# Patient Record
Sex: Female | Born: 1960 | Race: White | Hispanic: No | Smoking: Former smoker
Health system: Southern US, Community
[De-identification: ages and names within clinical notes are randomized; demographics above are authoritative.]

## PROBLEM LIST (undated history)

## (undated) DIAGNOSIS — F419 Anxiety disorder, unspecified: Secondary | ICD-10-CM

## (undated) DIAGNOSIS — F32A Depression, unspecified: Secondary | ICD-10-CM

## (undated) DIAGNOSIS — R079 Chest pain, unspecified: Secondary | ICD-10-CM

## (undated) DIAGNOSIS — G4733 Obstructive sleep apnea (adult) (pediatric): Secondary | ICD-10-CM

## (undated) DIAGNOSIS — K219 Gastro-esophageal reflux disease without esophagitis: Secondary | ICD-10-CM

## (undated) DIAGNOSIS — O2230 Deep phlebothrombosis in pregnancy, unspecified trimester: Secondary | ICD-10-CM

## (undated) DIAGNOSIS — E7800 Pure hypercholesterolemia, unspecified: Secondary | ICD-10-CM

## (undated) DIAGNOSIS — F329 Major depressive disorder, single episode, unspecified: Secondary | ICD-10-CM

## (undated) DIAGNOSIS — H9319 Tinnitus, unspecified ear: Secondary | ICD-10-CM

## (undated) DIAGNOSIS — E669 Obesity, unspecified: Secondary | ICD-10-CM

## (undated) DIAGNOSIS — Z9289 Personal history of other medical treatment: Secondary | ICD-10-CM

## (undated) DIAGNOSIS — K760 Fatty (change of) liver, not elsewhere classified: Secondary | ICD-10-CM

## (undated) DIAGNOSIS — I4819 Other persistent atrial fibrillation: Secondary | ICD-10-CM

## (undated) DIAGNOSIS — I2699 Other pulmonary embolism without acute cor pulmonale: Secondary | ICD-10-CM

## (undated) DIAGNOSIS — I10 Essential (primary) hypertension: Secondary | ICD-10-CM

## (undated) DIAGNOSIS — Z9989 Dependence on other enabling machines and devices: Secondary | ICD-10-CM

## (undated) DIAGNOSIS — M797 Fibromyalgia: Secondary | ICD-10-CM

## (undated) DIAGNOSIS — G43909 Migraine, unspecified, not intractable, without status migrainosus: Secondary | ICD-10-CM

## (undated) DIAGNOSIS — D649 Anemia, unspecified: Secondary | ICD-10-CM

## (undated) DIAGNOSIS — G4730 Sleep apnea, unspecified: Secondary | ICD-10-CM

## (undated) HISTORY — PX: COLONOSCOPY: SHX174

## (undated) HISTORY — PX: ULNAR NERVE TRANSPOSITION: SHX2595

## (undated) HISTORY — PX: APPENDECTOMY: SHX54

## (undated) HISTORY — PX: INNER EAR SURGERY: SHX679

---
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
## 1983-11-05 HISTORY — PX: TUBAL LIGATION: SHX77

## 1998-09-06 ENCOUNTER — Emergency Department (HOSPITAL_COMMUNITY): Admission: EM | Admit: 1998-09-06 | Discharge: 1998-09-06 | Payer: Self-pay | Admitting: Emergency Medicine

## 2000-10-10 ENCOUNTER — Encounter (INDEPENDENT_AMBULATORY_CARE_PROVIDER_SITE_OTHER): Payer: Self-pay | Admitting: Specialist

## 2000-10-10 ENCOUNTER — Ambulatory Visit (HOSPITAL_BASED_OUTPATIENT_CLINIC_OR_DEPARTMENT_OTHER): Admission: RE | Admit: 2000-10-10 | Discharge: 2000-10-10 | Payer: Self-pay | Admitting: *Deleted

## 2004-07-28 ENCOUNTER — Emergency Department (HOSPITAL_COMMUNITY): Admission: EM | Admit: 2004-07-28 | Discharge: 2004-07-28 | Payer: Self-pay | Admitting: Emergency Medicine

## 2004-07-30 ENCOUNTER — Inpatient Hospital Stay (HOSPITAL_COMMUNITY): Admission: EM | Admit: 2004-07-30 | Discharge: 2004-08-01 | Payer: Self-pay | Admitting: Emergency Medicine

## 2004-10-30 ENCOUNTER — Emergency Department (HOSPITAL_COMMUNITY): Admission: EM | Admit: 2004-10-30 | Discharge: 2004-10-30 | Payer: Self-pay | Admitting: Advanced Practice Midwife

## 2005-09-06 ENCOUNTER — Emergency Department (HOSPITAL_COMMUNITY): Admission: EM | Admit: 2005-09-06 | Discharge: 2005-09-07 | Payer: Self-pay | Admitting: Emergency Medicine

## 2006-02-19 ENCOUNTER — Inpatient Hospital Stay (HOSPITAL_COMMUNITY): Admission: EM | Admit: 2006-02-19 | Discharge: 2006-02-28 | Payer: Self-pay | Admitting: *Deleted

## 2006-02-20 ENCOUNTER — Ambulatory Visit: Payer: Self-pay | Admitting: *Deleted

## 2006-03-03 ENCOUNTER — Other Ambulatory Visit (HOSPITAL_COMMUNITY): Admission: RE | Admit: 2006-03-03 | Discharge: 2006-03-05 | Payer: Self-pay | Admitting: Psychiatry

## 2006-03-12 ENCOUNTER — Emergency Department (HOSPITAL_COMMUNITY): Admission: EM | Admit: 2006-03-12 | Discharge: 2006-03-13 | Payer: Self-pay | Admitting: Emergency Medicine

## 2006-03-13 ENCOUNTER — Inpatient Hospital Stay (HOSPITAL_COMMUNITY): Admission: EM | Admit: 2006-03-13 | Discharge: 2006-03-17 | Payer: Self-pay | Admitting: Psychiatry

## 2006-03-18 ENCOUNTER — Other Ambulatory Visit (HOSPITAL_COMMUNITY): Admission: RE | Admit: 2006-03-18 | Discharge: 2006-04-17 | Payer: Self-pay | Admitting: Psychiatry

## 2006-07-14 ENCOUNTER — Other Ambulatory Visit: Admission: RE | Admit: 2006-07-14 | Discharge: 2006-07-14 | Payer: Self-pay | Admitting: Family Medicine

## 2008-02-11 ENCOUNTER — Other Ambulatory Visit: Admission: RE | Admit: 2008-02-11 | Discharge: 2008-02-11 | Payer: Self-pay | Admitting: Family Medicine

## 2008-08-24 ENCOUNTER — Observation Stay (HOSPITAL_COMMUNITY): Admission: EM | Admit: 2008-08-24 | Discharge: 2008-08-26 | Payer: Self-pay | Admitting: Emergency Medicine

## 2008-12-07 ENCOUNTER — Encounter: Admission: RE | Admit: 2008-12-07 | Discharge: 2008-12-07 | Payer: Self-pay | Admitting: Family Medicine

## 2009-02-10 ENCOUNTER — Other Ambulatory Visit: Admission: RE | Admit: 2009-02-10 | Discharge: 2009-02-10 | Payer: Self-pay | Admitting: Family Medicine

## 2009-03-15 ENCOUNTER — Encounter: Admission: RE | Admit: 2009-03-15 | Discharge: 2009-03-15 | Payer: Self-pay | Admitting: Family Medicine

## 2009-04-09 ENCOUNTER — Observation Stay (HOSPITAL_COMMUNITY): Admission: EM | Admit: 2009-04-09 | Discharge: 2009-04-09 | Payer: Self-pay | Admitting: Emergency Medicine

## 2009-06-22 ENCOUNTER — Encounter: Admission: RE | Admit: 2009-06-22 | Discharge: 2009-06-22 | Payer: Self-pay

## 2009-09-18 ENCOUNTER — Encounter: Admission: RE | Admit: 2009-09-18 | Discharge: 2009-09-18 | Payer: Self-pay | Admitting: Family Medicine

## 2009-10-23 IMAGING — CR DG LUMBAR SPINE COMPLETE 4+V
5 series · 5 of 5 positions shown · non-contrast
Comparison: 07/30/2004

Addendum Begins

Note that the SI joints appear unremarkable.
Addendum Ends
CLINICAL DATA: Pain right SI area; question sacroiliitis
LUMBAR SPINE - COMPLETE 4+ VIEW

[view not recorded (1 of 5)]
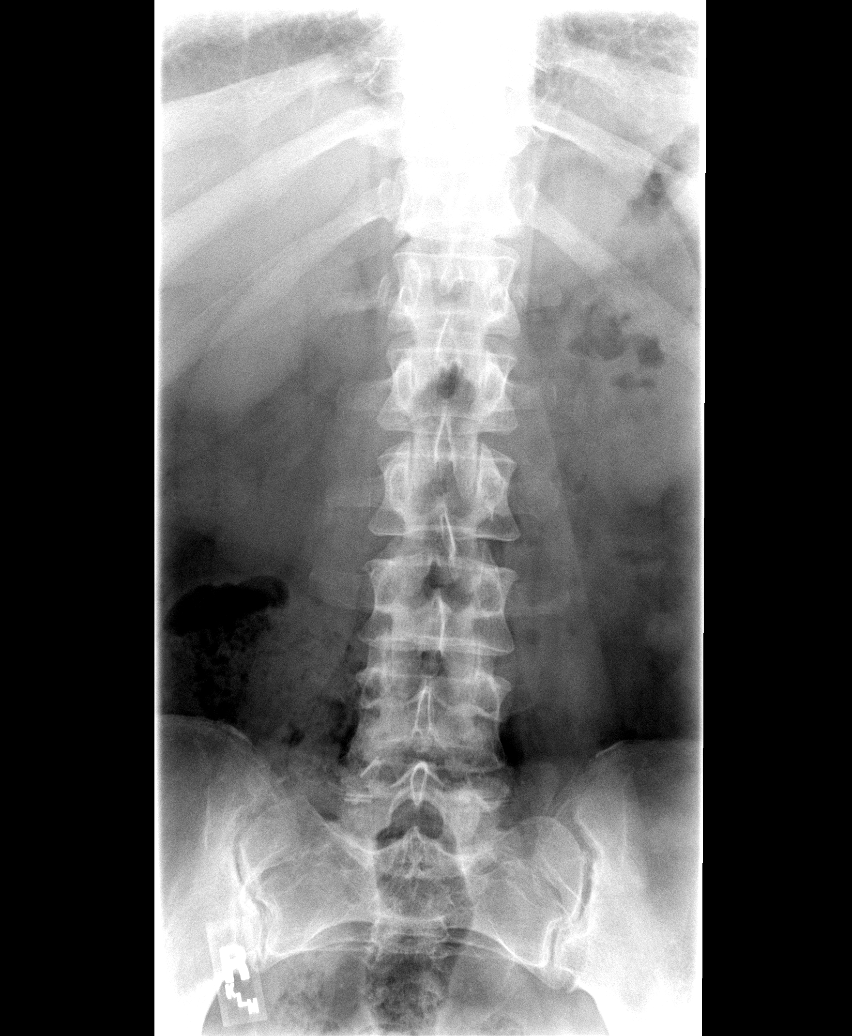

[view not recorded (2 of 5)]
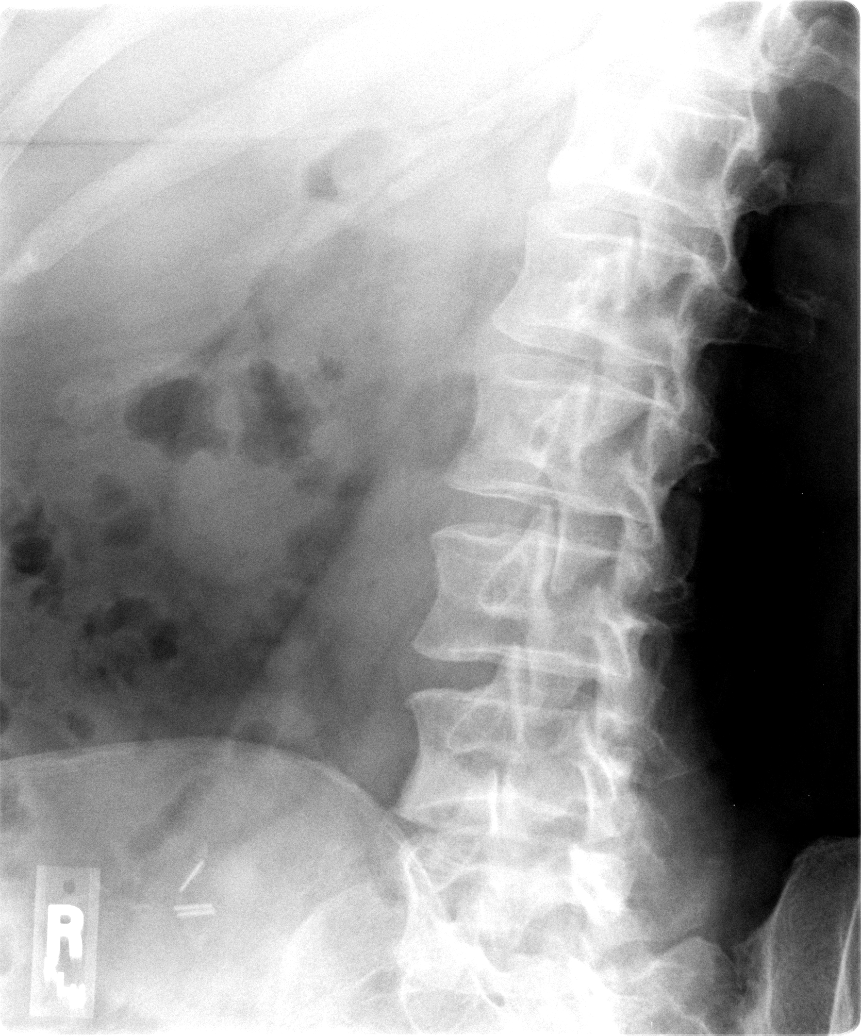

[view not recorded (3 of 5)]
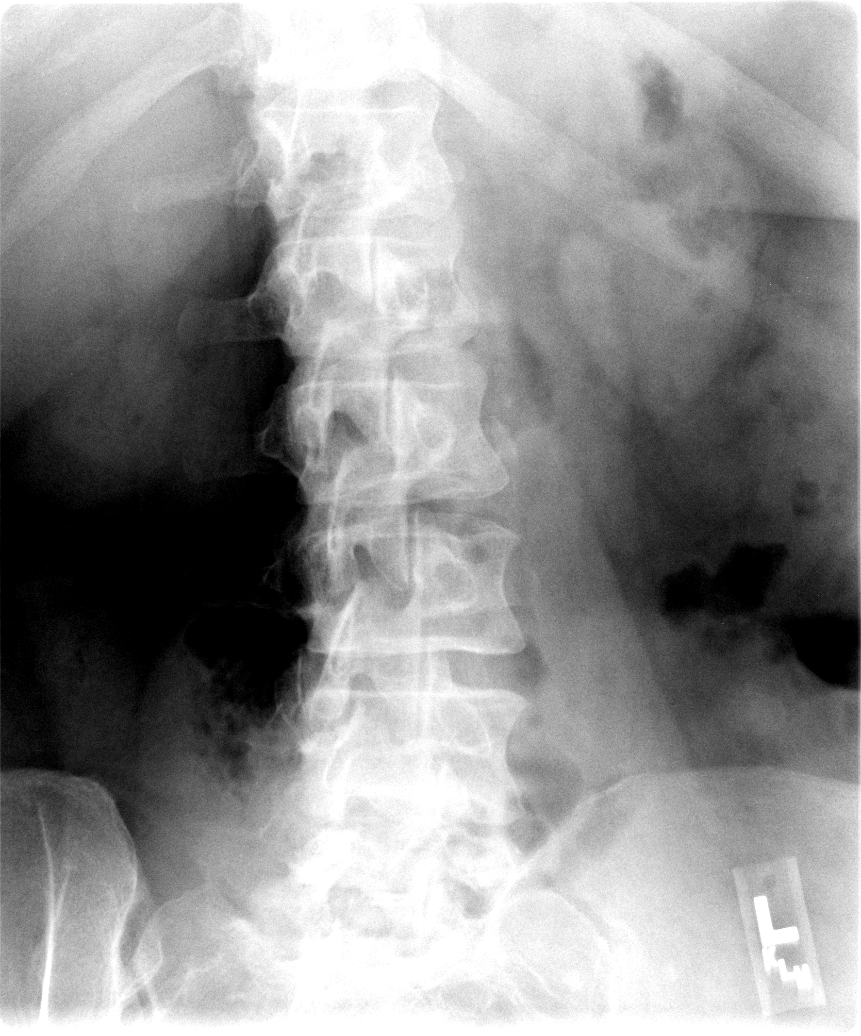

[view not recorded (4 of 5)]
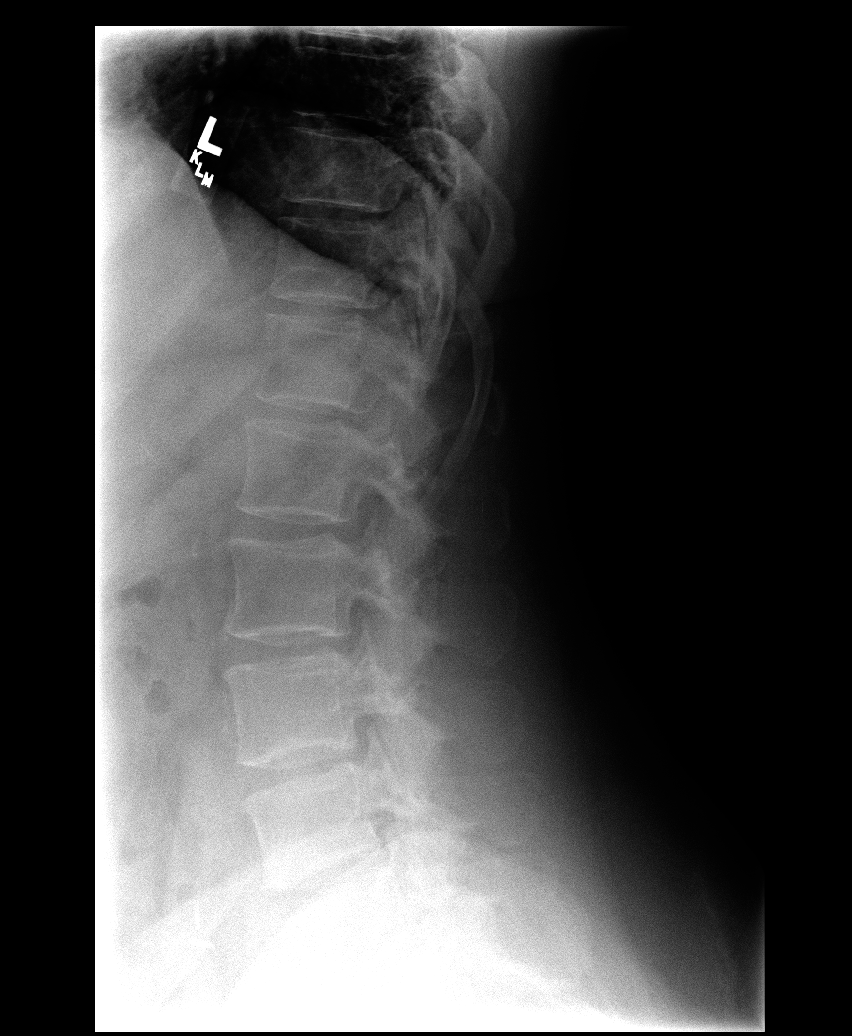

[view not recorded (5 of 5)]
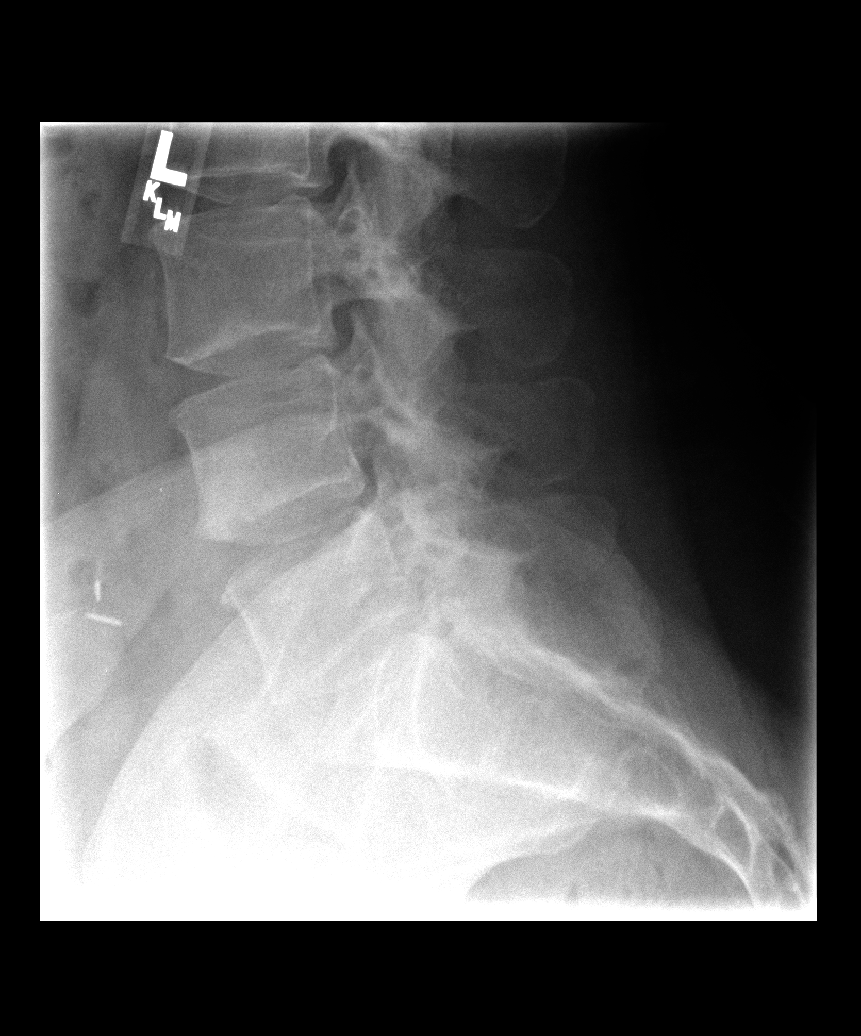

[5 of 5 positions shown; findings below may reference images not displayed]

FINDINGS: There is no evidence of lumbar spine fracture.  Alignment
is normal.  Intervertebral disc spaces are maintained.No pars
defects or spondylolisthesis.
IMPRESSION: Negative.

## 2010-05-30 ENCOUNTER — Other Ambulatory Visit: Admission: RE | Admit: 2010-05-30 | Discharge: 2010-05-30 | Payer: Self-pay | Admitting: Family Medicine

## 2010-11-26 ENCOUNTER — Encounter: Payer: Self-pay | Admitting: Family Medicine

## 2011-02-11 LAB — PROTIME-INR
INR: 1 (ref 0.00–1.49)
Prothrombin Time: 13.2 seconds (ref 11.6–15.2)

## 2011-02-11 LAB — DIFFERENTIAL
Basophils Absolute: 0 10*3/uL (ref 0.0–0.1)
Basophils Relative: 1 % (ref 0–1)
Eosinophils Absolute: 0.1 10*3/uL (ref 0.0–0.7)
Eosinophils Relative: 3 % (ref 0–5)
Lymphocytes Relative: 30 % (ref 12–46)
Lymphs Abs: 1.5 10*3/uL (ref 0.7–4.0)
Monocytes Absolute: 0.3 10*3/uL (ref 0.1–1.0)
Monocytes Relative: 7 % (ref 3–12)
Neutro Abs: 3.1 10*3/uL (ref 1.7–7.7)
Neutrophils Relative %: 61 % (ref 43–77)

## 2011-02-11 LAB — POCT CARDIAC MARKERS
CKMB, poc: <1 ng/mL — ABNORMAL LOW (ref 1.0–8.0)
CKMB, poc: <1 ng/mL — ABNORMAL LOW (ref 1.0–8.0)
Myoglobin, poc: 33.9 ng/mL (ref 12–200)
Myoglobin, poc: 38.6 ng/mL (ref 12–200)
Troponin i, poc: <0.05 ng/mL (ref 0.00–0.09)
Troponin i, poc: <0.05 ng/mL (ref 0.00–0.09)

## 2011-02-11 LAB — COMPREHENSIVE METABOLIC PANEL
ALT: 74 U/L — ABNORMAL HIGH (ref 0–35)
AST: 47 U/L — ABNORMAL HIGH (ref 0–37)
Albumin: 3.9 g/dL (ref 3.5–5.2)
Alkaline Phosphatase: 68 U/L (ref 39–117)
BUN: 8 mg/dL (ref 6–23)
CO2: 25 mEq/L (ref 19–32)
Calcium: 9.5 mg/dL (ref 8.4–10.5)
Chloride: 112 mEq/L (ref 96–112)
Creatinine, Ser: 0.71 mg/dL (ref 0.4–1.2)
GFR calc Af Amer: >60 mL/min (ref >60)
GFR calc non Af Amer: >60 mL/min (ref >60)
Glucose, Bld: 103 mg/dL — ABNORMAL HIGH (ref 70–99)
Potassium: 3.9 mEq/L (ref 3.5–5.1)
Sodium: 144 mEq/L (ref 135–145)
Total Bilirubin: 0.4 mg/dL (ref 0.3–1.2)
Total Protein: 6.5 g/dL (ref 6.0–8.3)

## 2011-02-11 LAB — POCT I-STAT, CHEM 8
BUN: 11 mg/dL (ref 6–23)
Calcium, Ion: 1.16 mmol/L (ref 1.12–1.32)
Chloride: 107 mEq/L (ref 96–112)
Creatinine, Ser: 0.9 mg/dL (ref 0.4–1.2)
Glucose, Bld: 111 mg/dL — ABNORMAL HIGH (ref 70–99)
HCT: 35 % — ABNORMAL LOW (ref 36.0–46.0)
Hemoglobin: 11.9 g/dL — ABNORMAL LOW (ref 12.0–15.0)
Potassium: 3.8 mEq/L (ref 3.5–5.1)
Sodium: 141 mEq/L (ref 135–145)
TCO2: 25 mmol/L (ref 0–100)

## 2011-02-11 LAB — CBC
HCT: 33.6 % — ABNORMAL LOW (ref 36.0–46.0)
Hemoglobin: 11.7 g/dL — ABNORMAL LOW (ref 12.0–15.0)
MCHC: 34.9 g/dL (ref 30.0–36.0)
MCV: 85.9 fL (ref 78.0–100.0)
Platelets: 236 10*3/uL (ref 150–400)
RBC: 3.91 MIL/uL (ref 3.87–5.11)
RDW: 12.9 % (ref 11.5–15.5)
WBC: 5.1 10*3/uL (ref 4.0–10.5)

## 2011-02-11 LAB — CK TOTAL AND CKMB (NOT AT ARMC)
CK, MB: 1.1 ng/mL (ref 0.3–4.0)
Relative Index: INVALID (ref 0.0–2.5)
Total CK: 92 U/L (ref 7–177)

## 2011-02-11 LAB — APTT: aPTT: 31 seconds (ref 24–37)

## 2011-02-11 LAB — D-DIMER, QUANTITATIVE: D-Dimer, Quant: 0.83 ug/mL-FEU — ABNORMAL HIGH (ref 0.00–0.48)

## 2011-03-19 NOTE — Consult Note (Signed)
NAME:  Regina Morales, Regina Morales              ACCOUNT NO.:  [id number]   MEDICAL RECORD NO.:  [id number]          PATIENT TYPE:  OBV   LOCATION:  4735                         FACILITY:  [facility]   PHYSICIAN:  Armanda Magic, M.D.     DATE OF BIRTH:  Jul 24, 1961   DATE OF CONSULTATION:  DATE OF DISCHARGE:  08/26/2008                                 CONSULTATION   CHIEF COMPLAINT/REASON FOR CONSULTATION:  Chest pain.   Ms. Regina Morales is a 50 year old female who started feeling sick and  nauseated around 3:00 p.m. on August 24, 2008.  She went home and then  started having substernal chest pain that she describes as a cramping  sensation.  There was no radiation.  Incidentally, she did have a short  episode of lower back pain as well.  Anyway, she got scared and called  EMS.  At the hospital, she was given sublingual nitroglycerin x1 with  partial relief, but morphine totally relieved her symptoms.  She has had  no further chest pain.   PAST MEDICAL HISTORY:  History of appendectomy.  She also states that  she has had blood clots in her legs before and that was treated with  heparin but denies ever being on Coumadin and this was years ago.   SOCIAL HISTORY:  She lives in [place].  She quit smoking 20 years  ago.  No alcohol or illicit drug use.   FAMILY HISTORY:  Dad had coronary disease in his 34s.   ALLERGIES:  DARVON and CODEINE.   MEDICATIONS PRIOR TO ADMISSION:  None.   MEDICATIONS IN THE HOSPITAL:  1. Lovenox 40 mg daily.  2. Protonix 40 mg a day.   REVIEW OF SYMPTOMS:  Chest pain, nausea.  Review of systems is otherwise  negative.   PHYSICAL EXAMINATION:  VITAL SIGNS:  Temp 97.60, respirations 20, blood  pressure 100/62, O2 saturation 95% on room air.  GENERAL:  She is in no acute distress.  HEENT:  Grossly normal.  Sclerae clear.  Conjunctivae normal.  Nares  without drainage.  NECK:  No carotid or subclavian bruits.  No JVD or thyromegaly.  CHEST:  Clear to auscultation  bilaterally.  No wheezing or rhonchi.  HEART:  Regular rate and rhythm.  No gross murmur.  ABDOMEN:  Good bowel sounds, nontender, nondistended.  No masses.  No  bruits.  Obese.  EXTREMITIES:  No peripheral edema.  Palpable PT/DP pulses bilaterally.  SKIN:  Warm and dry.  NEURO:  Cranial nerves II through XII grossly intact.  She has underwent  a normal mood and affect.   Chest x-ray showed mild basilar atelectasis.   LABORATORY DATA:  Urinalysis negative.  Urine pregnancy test negative.  Total cholesterol 176, triglycerides 77, HDL 46, LDL 115, D-dimer 1.21.  BNP less than 30.  Hemoglobin A1c 5.2.  TSH 0.522.  Hemoglobin 11.3,  hematocrit 32.9, platelets 256, white count 7.6.  Sodium 139, potassium  3.9, BUN 11, creatinine 0.73.  PT 13.4, INR 1.0.  Cardiac enzymes  negative x3.   EKG, normal sinus rhythm at 66 with a first-degree AV block, otherwise,  normal.   ASSESSMENT/PLAN:  1. Chest pain.  She is placed on Protonix and Lovenox      prophylactically.  Her enzymes were negative.  2. Elevated D-dimer, needs CT of the chest.  If CT of the chest is      negative for PE, she will need a stress Cardiolite.  We can do this      as an outpatient, so that she is ruled out and is currently pain      free.  The patient was seen and examined with Dr. Armanda Magic.      Guy Franco, P.A.      Armanda Magic, M.D.  Electronically Signed    LB/MEDQ  D:  08/26/2008  T:  08/26/2008  Job:  [id number]   cc:   L. Lupe Carney, M.D.

## 2011-03-19 NOTE — H&P (Signed)
NAME:  Regina Morales, Regina Morales              ACCOUNT NO.:  [id number]   MEDICAL RECORD NO.:  [id number]          PATIENT TYPE:  INP   LOCATION:  4738                         FACILITY:  [facility]   PHYSICIAN:  Darryl D. Prime, MD    DATE OF BIRTH:  1961/04/09   DATE OF ADMISSION:  04/09/2009  DATE OF DISCHARGE:                              HISTORY & PHYSICAL   Patient is full code.   PRIMARY CARE PHYSICIAN:  L. Lupe Carney, M.D.   CHIEF COMPLAINT:  Chest pain.   HISTORY OF PRESENT ILLNESS:  Ms. Regina Morales is a 50 year old female with a  history of obesity, history of chest pain in October 2009, had a CT scan  of the chest after positive D-dimer that was negative for pulmonary  embolus but did show a mass, right upper lobe, 12 x 10 mm.  She had an  outpatient stress exercise echocardiogram which was negative but there  was concern for regurgitation.  She notes that chest pain woke her from  sleep tonight around midnight, described as a pressure, dull sensation,  left of the sternum with associated shortness of breath and dizziness.  It was not pleuritic.  It was not positional.  It was not radiating.  She did have sweats with it.  It lasted 30 minutes.  She thinks it may  have been relieved with Valium which she took.  She woke up her daughter  and EMS was called.  En route they gave her 325 mg of aspirin.  The  episode lasted for 30 minutes.  She denies any recent black or bloody  stools.  She has been taking etodolac recently because of right hip  pain.  The patient denies any recent fever or cough.   PAST MEDICAL AND SURGICAL HISTORY:  1. History of depression, admitted in 2007 for this.  She took      multiple pills at that time in an unprescribed fashion.  2. History of chest pain as above.  3. History of a 12 x 10 mm right upper lobe lesion which was followed      up in February 2010 by a CT scan with no change.  She is scheduled      to have a repeat in August 2010.  4. History of right  hip pain.  5. 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   ALLERGIES:  DARVON and CODEINE.   MEDICATIONS:  1. Ultram 50 mg every 4 hours as needed for pain.  2. Valium 5 to 10 mg, she takes this p.r.n., takes on average every      week or so.  3. Etodolac 400 mg twice a day.   SOCIAL HISTORY:  She did smoke in the past, discontinued about 18 years  ago.  No alcohol or illicit drug use.  She works for [employer].   FAMILY HISTORY:  Mother died in her 4s of congestive heart failure, she  died in Nov 12, 2023.  Father died in his 52s due to complications of a  myocardial infarction.   REVIEW OF SYSTEMS:  A 14-point review of systems is negative  unless  stated above.  She has had recent significant depressed mood and anxiety  related to her mother's death in 2023/10/29.   PHYSICAL EXAMINATION:  VITAL SIGNS:  Temperature 98.2, blood pressure  116/61, pulse of 75, respirations 17, oxygen saturation 99% in room air.  GENERAL:  She is an obese female, lying flat in bed in no acute  distress.  HEENT:  Normocephalic, atraumatic.  Pupils are equal, round and reactive  to light with extraocular movements being intact.  Oropharynx shows no posterior pharyngeal lesions.  NECK:  Supple without lymphadenopathy or thyromegaly.  No carotid  bruits.  LUNGS:  Clear to auscultation bilaterally.  CARDIOVASCULAR:  Regular rate and rhythm.  No murmurs, rubs or gallops.  Normal S1 and S2.  No S3 or S4.  ABDOMEN:  Obese, soft, nontender.  Nondistended.  Normoactive bowel  sounds.  EXTREMITIES:  No clubbing, cyanosis, but she does have 1-2+ lower  extremity edema.  NEUROLOGIC: She is alert and oriented x4 with cranial nerves II through  XII grossly intact.  Strength and sensation grossly intact.  SKIN:  No rash or ulcers.  LYMPHATICS:  She has 2+ lower extremity edema as above with no anterior  cervical lymphadenopathy.  PSYCHIATRIC:  Normal mood and affect, alert and oriented x4.  MUSCULOSKELETAL:  She moves  all her joints well with no signs of  effusion.   LABORATORY DATA:  White count is 5.1 with hemoglobin of 11.7, hematocrit  33.6, platelet count of  236,000, segs 61, lymphocytes 30.  Cardiac  markers at 3:40 a.m. and 5:10 a.m. were unremarkable.  The patient's EKG  showed sinus rhythm with a ventricular rate of 75 beats per minute,  otherwise no significant ST or T changes.  No change compared to October  2009 EKG.   ASSESSMENT AND PLAN:  This is a patient with a history of deep vein  thrombosis, history of obesity, lower extremity edema, who has had a  recent chest pain episode and per patient had a negative stress test in  November 2009.  She is now here again with chest pain.  Differential  diagnoses could include supraventricular tachycardia, but could also  include deep vein thrombosis or pulmonary embolus syndrome, however less  likely acute coronary syndrome.   At this time, she will be admitted to rule out acute coronary syndrome  with cardiac markers x3.  She will be on aspirin.  There is a small  possibility that this could be an ulcer so will Hemoccult her stools and  hold etodolac for now.  For her rule out pulmonary embolus, will get a D-  dimer and if positive she may need imaging again.   For the patient's concern for possible tachycardia arrhythmias, she will  be on telemetry.  Deep vein thrombosis and gastrointestinal prophylaxis  will be ordered.      Darryl D. Prime, MD  Electronically Signed     DDP/MEDQ  D:  04/09/2009  T:  04/09/2009  Job:  [id number]

## 2011-03-19 NOTE — H&P (Signed)
NAME:  Regina Morales, Regina Morales              ACCOUNT NO.:  [id number]   MEDICAL RECORD NO.:  [id number]          PATIENT TYPE:  EMS   LOCATION:  MAJO                         FACILITY:  [facility]   PHYSICIAN:  Michiel Cowboy, MDDATE OF BIRTH:  [birth year]   DATE OF ADMISSION:  08/24/2008  DATE OF DISCHARGE:                              HISTORY & PHYSICAL   PRIMARY CARE PHYSICIAN:  L. Lupe Carney, M.D.   CHIEF COMPLAINT:  Chest pain.   HISTORY OF PRESENT ILLNESS:  The patient is a 50 year old female with no  past medical history, presents with sudden onset of sharp substernal and  left chest pain, nonradiating, nonexertional, occurred at rest.  The  patient was not feeling well the whole day.  Once the pain occurred,  she started feeling diaphoretic, nauseous and short of breath, at which  point she called EMS.  Of note, her family history is significant for  father dying of MI in his 3s.  She did not know about the rest of her  family because she does not keep up with them.  She has not had any  recent travel or plane ride.  She does have sedentary job.  She reports  occasionally she has a lot of lower extremity edema, but not currently.   Otherwise, review of systems negative.  No fevers or chills.  No  vomiting, although she had nausea, no diarrhea.   PAST MEDICAL HISTORY:  None.   SOCIAL HISTORY:  Used to smoke, but quit 18 years ago.  Does not drink  alcohol.  Lives at home.  Currently unemployed.   ALLERGIES:  DARVON AND CODEINE.   MEDICATIONS:  None.   PHYSICAL EXAMINATION:  VITAL SIGNS:  Temperature 97.8, blood pressure  117/72, pulse 67, respirations 18, sating 98% on room air.  GENERAL:  The patient appears to be currently in no acute distress.  Obese female laying down in bed .  HEENT:  Nontraumatic.  Mucous membranes moist.  NECK:  Supple.  No lymphadenopathy noted.  HEART:  Regular rate and rhythm.  No murmurs, rubs or gallops.  No chest  wall tenderness noted.  LUNGS:  Clear to auscultation bilaterally.  ABDOMEN:  Slightly obese, but soft, nontender, nondistended.  EXTREMITIES:  Lower extremities without cyanosis, clubbing or edema.  NEUROLOGICAL:  Intact.   LABORATORY DATA:  White blood cell count 8.1, hemoglobin 12, sodium 135,  potassium 4.1, creatinine 0.72.  Cardiac markers within normal limits.  LFTs only significant for ALT slightly elevated at 55.  UA within normal  limits.  EKG showing sinus rhythm with questionable first degree AV  block.  No old EKG available.  Chest x-ray showing mild patchy density  in the lower lobes that could be atelectasis and mild pneumonia.  D.  dimer not obtained.   ASSESSMENT/PLAN:  This is a 50 year old female with somewhat atypical  chest pain and exertional, but associated with shortness of breath with  some concern of family history.  Otherwise, no other risk factors.   1. Chest pain.  Given family history, we will admit and monitor.  Will  cycle cardiac enzymes x3.  Serial ECGs.  Further risks stratified      with fasting lipid panel, hemoglobin A1c.  Obtain TSH.  BNP and      order a D. dimer as the patient had sudden set of shortness of      breath and chest pain.  2. Abnormal chest x-ray.  Question atelectasis versus infiltrate.      Will repeat in a.m.  At this point, will not use antibiotics as the      patient does not have white blood cell count, does not have cough      or fever.  I think this is more likely atelectasis.  3. Protonix and Lovenox for prophylaxis.      Michiel Cowboy, MD  Electronically Signed     AVD/MEDQ  D:  08/24/2008  T:  08/25/2008  Job:  [id number]   cc:   L. Lupe Carney, M.D.

## 2011-03-19 NOTE — Discharge Summary (Signed)
NAME:  Regina Morales, Regina Morales              ACCOUNT NO.:  [id number]   MEDICAL RECORD NO.:  [id number]          PATIENT TYPE:  OBV   LOCATION:  4735                         FACILITY:  [facility]   PHYSICIAN:  Ramiro Harvest, MD    DATE OF BIRTH:  November 27, 1960   DATE OF ADMISSION:  08/24/2008  DATE OF DISCHARGE:  08/26/2008                               DISCHARGE SUMMARY   ATTENDING PHYSICIAN:  Ramiro Harvest, MD   DISCHARGE DIAGNOSES:  1. Chest pain of unclear etiology.  2. Right upper lobe nodular area.   DISCHARGE MEDICATIONS:  Nitroglycerin 0.4 mg sublingual q.5 minutes x3  for chest pain p.r.n.   PRIMARY CARE PHYSICIAN:  L. Lupe Carney, MD, of [facility].   DISPOSITION AND FOLLOWUP:  The patient will be discharged home.  The  patient will need to follow up with PCP in 2 weeks and followup basic  metabolic profile needs to be checked to follow up on electrolytes and  renal function.  The patient will also need a repeat chest CT in 3  months to follow up on this right upper lobe nodular region.  The  patient is also scheduled to follow up with Dr. Armanda Magic on September 08, 2008, at 9:15 a.m. for outpatient stress Cardiolite.   CONSULTATIONS DONE:  A cardiology consult was done.  The patient was  seen in consultation by Dr. Armanda Magic of [facility] on August 25, 2008.   PROCEDURES PERFORMED:  A chest x-ray was performed on August 24, 2008,  that showed mild patchy density in the lower lobes that could represent  atelectasis or mild pneumonia.  Repeat chest x-ray was done on August 25, 2008, that showed no definite pneumonia, mild basilar atelectasis  remains.  CT angiogram of the chest was done on August 25, 2008, that  showed scattered areas of ill-defined opacification in the lungs  bilaterally.  It may represent an atypical infection.  The more nodular  area on the right upper lobe could represent infection or inflammation,  but it is concerning for neoplasm.   Short-term followup CT scan at 3  months for an appropriate therapy would be useful for further  evaluation.  No evidence for pulmonary embolism.   BRIEF ADMISSION HISTORY AND PHYSICAL:  Ms. Regina Morales is a 50-year-  old female with no significant past medical history, who presented with  sudden onset of sharp substernal left-sided chest pain, nonradiating in  nature, which occurred at rest.  The patient did not feel well the whole  day.  Once the patient's pain occurred, she started feeling diaphoretic,  nauseous, shortness of breath at which point she called EMS.  Of note,  she has a significant family history of acute MI with her father dying  in his 87s.  The patient did not know what the rest of her family had  because she has not kept up with them.  The patient has not had any  recent travel or long plane rides.  The patient does have a sedentary  job.  The patient reports occasionally that she has had a  lot of lower  extremity edema, but is currently not experiencing any.   PHYSICAL EXAMINATION:  VITAL SIGNS:  Per admitting physician,  temperature 97.8, blood pressure 117/72, pulse of 67, respiratory rate  18, and satting 98% on room air.  GENERAL:  The patient is in no acute distress, obese female, lying in  bed.  HEENT:  Normocephalic and atraumatic.  Pupils are equal, round, and  reactive to light.  Extraocular movements are intact.  Oropharynx is  clear.  No lesions.  No exudates.  NECK:  Supple.  No lymphadenopathy.  CARDIOVASCULAR:  Regular rate and rhythm.  No murmurs, rubs, or gallops.  No chest wall tenderness.  RESPIRATORY:  Lungs are clear to auscultation bilaterally.  ABDOMEN:  Soft, obese, nontender, nondistended, positive bowel sounds.  EXTREMITIES:  No clubbing, cyanosis, or edema.  NEUROLOGICAL:  The patient is alert and oriented x3.  Cranial nerves II-  XII are grossly intact.  No focal deficits.   ADMISSION LABORATORY DATA:  CBC:  White count 8.1 and  hemoglobin 12.  Sodium 135, potassium 4.1, and creatinine 0.72.  Cardiac markers were  within normal limits.  LFTs only significant for a slight ALT elevation  at 55.  UA was within normal limits.  EKG showed a normal sinus rhythm  with a first-degree AV block.  No old EKG was available for comparison.  Chest x-ray as stated above showing mild patchy density in the lower  lobes, which could have atelectasis versus mild pneumonia.  D-dimer was  pending at the time.   HOSPITAL COURSE:  1. Chest pain.  The patient was admitted for chest pain, rule out MI      with a significant positive family history.  Cardiac enzymes were      cycled x3, which came back negative.  EKG was obtained, which did      not show any significant ST-T wave elevations.  A TSH was obtained,      which was within normal limits.  BNP obtained was less than 30.  A      D-dimer obtained was 1.21 and as such a CT angiogram of the chest      was obtained to rule out a PE as stated above.  CT angiogram of the      chest came back negative for a pulmonary embolism.  However, it did      show a nodular area in the right upper lobe and recommending 3      months followup CT.  The patient remained stable during the      hospitalization.  The patient's symptoms improved.  The patient was      not short of breath.  The patient did have 1 minor episode of chest      pain in the same area, which was not long lasting.  The patient      remained in stable and improved condition.  Cardiology was      consulted.  The patient was seen in consultation by Dr. Armanda Magic of [facility] on August 25, 2008, and felt that as      the patient had ruled out for acute coronary syndrome, the patient      was scheduled to have a followup outpatient stress Cardiolite,      which was scheduled for September 08, 2008, at 9:15 a.m.  The patient      remained in stable and improved condition and the patient  will be      discharged home in  stable and improved condition.  2. Right upper lobe nodular area per CT.  The patient was afebrile,      had a normal white count.  The patient was asymptomatic, did not      have any signs or symptoms of infection.  It was felt that CT can      be followed up in 3 months as an outpatient for further evaluation      of this right upper lobe nodular area.   On day of discharge vital signs, temperature 98.4, blood pressure  125/73, pulse of 69, respiratory rate 20, and satting 99% on room air.   DISCHARGE LABORATORY DATA:  Sodium 139, potassium 3.8, chloride 107,  bicarb 26, BUN 9, creatinine 0.68, glucose of 87, and calcium of 8.9.  CBC:  White count 6.0, hemoglobin 11.6, hematocrit 34.3, and platelets  of 237.  TSH of 0.522.   It was a pleasure taking care of Ms. Eulah Regina Morales.      Ramiro Harvest, MD  Electronically Signed     DT/MEDQ  D:  08/26/2008  T:  08/27/2008  Job:  [id number]   cc:   L. Lupe Carney, M.D.  Armanda Magic, M.D.
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
## 2011-03-22 NOTE — Discharge Summary (Signed)
NAME:  Regina Morales, Regina Morales              ACCOUNT NO.:  [id number]   MEDICAL RECORD NO.:  [id number]          PATIENT TYPE:  IPS   LOCATION:  0507                          FACILITY:  [facility]   PHYSICIAN:  Geoffery Lyons, M.D.      DATE OF BIRTH:  12/15/1960   DATE OF ADMISSION:  03/13/2006  DATE OF DISCHARGE:  03/17/2006                                 DISCHARGE SUMMARY   CHIEF COMPLAINT AND PRESENTING ILLNESS:  This was one of several  admissions  to [facility]  for this 50 year old white female,  voluntarily admitted.  History of panic attacks, feeling overwhelmed, took  more pills than over several hours, 6 Ambien, 14 Ativan, and 10 Naproxen,  hoping to go to sleep, denies suicidal ideas, felt tired, multiple  stressors.   PAST PSYCHIATRIC HISTORY:  Last admission April 2007, depressed, isolating.  Sees Fred May as a therapist, history of major depression, has seen Producer, television/film/video and [place].   ALCOHOL AND DRUG HISTORY:  Denies any active drug or alcohol use.   PAST MEDICAL HISTORY:  Noncontributory.   MEDICATIONS:  Ambien CR 12.5 at night, Zoloft 200 mg in the morning, Ativan  1 mg 3 times a day, Naproxen 250 twice a day.   PHYSICAL EXAMINATION:  Performed, failed to show any acute findings.   LABORATORY WORKUP:  Blood chemistries: Sodium 137, potassium 3.8, glucose  89. Urine pregnancy test negative.  Drug screen positive for  benzodiazepines.   MENTAL STATUS EXAM:  Reveals a fully alert, cooperative female, fair eye  contact.  Speech clear, normal rate, tempo and production, mood depressed,  affect depressed, thought process logical, coherent and relevant.  No  evidence of delusions, no active suicidal or homicidal ideas, no  hallucinations. Cognition well preserved.   ADMISSION DIAGNOSES:  AXIS I:  Major depression, recurrent., Panic attacks.  AXIS II:  No diagnosis.  AXIS III:  No diagnosis.  AXIS IV:  Moderate.  AXIS V:  Upon admission 30, highest  global assessment of functioning in the  last year 65-70.   COURSE IN HOSPITAL:  She was admitted and started in individual and group  psychotherapy.  She was initially maintained on her Ambien.  Zoloft was  placed at 150 and then 100 mg.  She was maintained on the Ativan and the  Naproxen.  She was  recently discharged from the unit, started coming to the  IOP program, very depressed.  Had an argument with her daughter.  This set  the stage.  Afterwards she became more upset, wanting to go to sleep and  over the course of the night she took an overdose of Ambien, Ativan and  opioids.  Admits she was feeling very depressed, down, with the sadness,  lack of energy, lack of motivation, sense of hopelessness and helplessness,  suicidal ruminations.  She has been on Zoloft for a long time, felt that it  was not effective anymore, she was willing to switch.  Endorsed a lot of  anxiety.  Anxiety was overwhelming, feeling that she was at the verge of a  nervous breakdown all the time.  We switched from Zoloft to Paxil and we  added some Neurontin.  In the next couple of days she seemed to be  improving.  She started being no active, participating in group and in  individual sessions.  Objectively, she was better.  On May 14 she was in  full contact with reality, feeling much better, objectively affect was  brighter.  Endorsed no acute suicidal or homicidal ideas, no evidence of  delusions.  The daughter came.  They were able to talk about their issues  and what they needed to do to improve their relationship and make things  work for both of them.  She felt relieved that the daughter had a better  attitude and overall she felt ready to go home and continue outpatient  treatment.  Upon discharge in full contact with reality, no suicidal or  homicidal ideation, markedly improved from admission.   DISCHARGE DIAGNOSES:  AXIS I:  Major depression, recurrent, anxiety disorder  not otherwise  specified.  AXIS II:  No diagnosis.  AXIS III:  No diagnosis.  AXIS IV:  Moderate.  AXIS V:  Upon discharge 60.   DISCHARGE MEDICATIONS:  1.  Zoloft 50 mg for 2 more days, then stop.  2.  Paxil CR 25 mg per day.  3.  Seroquel 50 at night.  4.  Neurontin 100 3 times a day.  5.  Requip 0.25 at 5 p.m.  6.  Ambien 10 at night for sleep.   DISPOSITION:  Follow up Fred May and Valinda Hoar.  Resume treatment at  the  Intensive Outpatient Program at [facility].      Geoffery Lyons, M.D.  Electronically Signed     IL/MEDQ  D:  04/08/2006  T:  04/09/2006  Job:  [id number]
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
## 2011-08-05 LAB — CBC
HCT: 32.9 — ABNORMAL LOW
HCT: 36.5
Hemoglobin: 11.3 — ABNORMAL LOW
Hemoglobin: 11.6 — ABNORMAL LOW
Hemoglobin: 12
MCHC: 33.7
MCHC: 34.4
MCV: 85.3
MCV: 87.1
RBC: 3.86 — ABNORMAL LOW
RBC: 3.94
RBC: 4.19
WBC: 6
WBC: 8.1

## 2011-08-05 LAB — DIFFERENTIAL
Basophils Relative: 1
Eosinophils Absolute: 0.1
Eosinophils Relative: 1
Lymphs Abs: 1.4
Lymphs Abs: 2
Monocytes Absolute: 0.4
Monocytes Relative: 7
Neutro Abs: 3.4

## 2011-08-05 LAB — COMPREHENSIVE METABOLIC PANEL
ALT: 55 — ABNORMAL HIGH
AST: 32
BUN: 11
CO2: 27
CO2: 28
Calcium: 9.2
Calcium: 9.3
Chloride: 102
Creatinine, Ser: 0.73
GFR calc Af Amer: >60
GFR calc Af Amer: >60
GFR calc non Af Amer: >60
GFR calc non Af Amer: >60
Glucose, Bld: 125 — ABNORMAL HIGH
Potassium: 4.1
Sodium: 135
Total Bilirubin: 0.4

## 2011-08-05 LAB — URINALYSIS, ROUTINE W REFLEX MICROSCOPIC
Bilirubin Urine: NEGATIVE
Ketones, ur: NEGATIVE
Nitrite: NEGATIVE
pH: 7

## 2011-08-05 LAB — CARDIAC PANEL(CRET KIN+CKTOT+MB+TROPI)
CK, MB: 0.7
Relative Index: INVALID
Relative Index: INVALID
Troponin I: <0.01

## 2011-08-05 LAB — HEMOGLOBIN A1C
Hgb A1c MFr Bld: 5.2
Mean Plasma Glucose: 103

## 2011-08-05 LAB — BASIC METABOLIC PANEL
CO2: 26
Calcium: 8.9
Chloride: 107
GFR calc Af Amer: >60
Sodium: 139

## 2011-08-05 LAB — PROTIME-INR
INR: 1
Prothrombin Time: 13.4

## 2011-08-05 LAB — TROPONIN I: Troponin I: 0.02

## 2011-08-05 LAB — LIPID PANEL
Total CHOL/HDL Ratio: 3.8
VLDL: 15

## 2013-04-12 ENCOUNTER — Other Ambulatory Visit (HOSPITAL_COMMUNITY)
Admission: RE | Admit: 2013-04-12 | Discharge: 2013-04-12 | Disposition: A | Discharge status: Home / Self Care | Payer: Self-pay | Source: Ambulatory Visit | Attending: Family Medicine | Admitting: Family Medicine

## 2013-04-12 ENCOUNTER — Other Ambulatory Visit: Payer: Self-pay | Admitting: Family Medicine

## 2013-04-12 DIAGNOSIS — Z124 Encounter for screening for malignant neoplasm of cervix: Secondary | ICD-10-CM | POA: Insufficient documentation

## 2013-07-14 ENCOUNTER — Emergency Department (HOSPITAL_COMMUNITY): Payer: BC Managed Care – PPO

## 2013-07-14 ENCOUNTER — Encounter (HOSPITAL_COMMUNITY): Payer: Self-pay | Admitting: Nurse Practitioner

## 2013-07-14 ENCOUNTER — Observation Stay (HOSPITAL_COMMUNITY)
Admission: EM | Admit: 2013-07-14 | Discharge: 2013-07-15 | Disposition: A | Discharge status: Home / Self Care | Payer: BC Managed Care – PPO | Attending: Cardiovascular Disease | Admitting: Cardiovascular Disease

## 2013-07-14 DIAGNOSIS — I4891 Unspecified atrial fibrillation: Principal | ICD-10-CM | POA: Insufficient documentation

## 2013-07-14 DIAGNOSIS — Z6841 Body Mass Index (BMI) 40.0 and over, adult: Secondary | ICD-10-CM | POA: Insufficient documentation

## 2013-07-14 DIAGNOSIS — R079 Chest pain, unspecified: Secondary | ICD-10-CM

## 2013-07-14 DIAGNOSIS — R0789 Other chest pain: Secondary | ICD-10-CM | POA: Diagnosis present

## 2013-07-14 DIAGNOSIS — E669 Obesity, unspecified: Secondary | ICD-10-CM | POA: Insufficient documentation

## 2013-07-14 DIAGNOSIS — I480 Paroxysmal atrial fibrillation: Secondary | ICD-10-CM | POA: Diagnosis present

## 2013-07-14 DIAGNOSIS — K8020 Calculus of gallbladder without cholecystitis without obstruction: Secondary | ICD-10-CM | POA: Insufficient documentation

## 2013-07-14 DIAGNOSIS — K219 Gastro-esophageal reflux disease without esophagitis: Secondary | ICD-10-CM | POA: Insufficient documentation

## 2013-07-14 HISTORY — DX: Chest pain, unspecified: R07.9

## 2013-07-14 HISTORY — DX: Fatty (change of) liver, not elsewhere classified: K76.0

## 2013-07-14 HISTORY — DX: Migraine, unspecified, not intractable, without status migrainosus: G43.909

## 2013-07-14 HISTORY — DX: Obesity, unspecified: E66.9

## 2013-07-14 HISTORY — DX: Gastro-esophageal reflux disease without esophagitis: K21.9

## 2013-07-14 LAB — COMPREHENSIVE METABOLIC PANEL WITH GFR
ALT: 41 U/L — ABNORMAL HIGH (ref 0–35)
AST: 29 U/L (ref 0–37)
Albumin: 4.3 g/dL (ref 3.5–5.2)
Alkaline Phosphatase: 99 U/L (ref 39–117)
BUN: 14 mg/dL (ref 6–23)
CO2: 27 meq/L (ref 19–32)
Calcium: 9.8 mg/dL (ref 8.4–10.5)
Chloride: 103 meq/L (ref 96–112)
Creatinine, Ser: 0.73 mg/dL (ref 0.50–1.10)
GFR calc Af Amer: >90 mL/min
GFR calc non Af Amer: >90 mL/min
Glucose, Bld: 97 mg/dL (ref 70–99)
Potassium: 4.1 meq/L (ref 3.5–5.1)
Sodium: 140 meq/L (ref 135–145)
Total Bilirubin: 0.3 mg/dL (ref 0.3–1.2)
Total Protein: 7.7 g/dL (ref 6.0–8.3)

## 2013-07-14 LAB — CBC WITH DIFFERENTIAL/PLATELET
Basophils Absolute: 0 K/uL (ref 0.0–0.1)
Basophils Relative: 0 % (ref 0–1)
Eosinophils Absolute: 0.1 K/uL (ref 0.0–0.7)
Eosinophils Relative: 1 % (ref 0–5)
HCT: 36.8 % (ref 36.0–46.0)
Hemoglobin: 12.9 g/dL (ref 12.0–15.0)
Lymphocytes Relative: 16 % (ref 12–46)
Lymphs Abs: 1.3 K/uL (ref 0.7–4.0)
MCH: 29.6 pg (ref 26.0–34.0)
MCHC: 35.1 g/dL (ref 30.0–36.0)
MCV: 84.4 fL (ref 78.0–100.0)
Monocytes Absolute: 0.5 K/uL (ref 0.1–1.0)
Monocytes Relative: 6 % (ref 3–12)
Neutro Abs: 6.3 K/uL (ref 1.7–7.7)
Neutrophils Relative %: 76 % (ref 43–77)
Platelets: 309 K/uL (ref 150–400)
RBC: 4.36 MIL/uL (ref 3.87–5.11)
RDW: 12.6 % (ref 11.5–15.5)
WBC: 8.3 K/uL (ref 4.0–10.5)

## 2013-07-14 LAB — URINALYSIS, ROUTINE W REFLEX MICROSCOPIC
Glucose, UA: NEGATIVE mg/dL
Ketones, ur: NEGATIVE mg/dL
pH: 7 (ref 5.0–8.0)

## 2013-07-14 LAB — URINE MICROSCOPIC-ADD ON

## 2013-07-14 LAB — TROPONIN I: Troponin I: <0.3 ng/mL (ref <0.30)

## 2013-07-14 LAB — APTT: aPTT: 29 seconds (ref 24–37)

## 2013-07-14 MED ORDER — SODIUM CHLORIDE 0.9 % IJ SOLN: 3.0000 mL | INTRAMUSCULAR | Status: DC | PRN

## 2013-07-14 MED ORDER — ACETAMINOPHEN 325 MG PO TABS: 650.0000 mg | ORAL_TABLET | ORAL | Status: DC | PRN

## 2013-07-14 MED ORDER — ASPIRIN EC 81 MG PO TBEC
81.0000 mg | DELAYED_RELEASE_TABLET | Freq: Every day | ORAL | Status: DC
  Administered 2013-07-15: 81 mg via ORAL
  Filled 2013-07-14: qty 1

## 2013-07-14 MED ORDER — ONDANSETRON HCL 4 MG/2ML IJ SOLN: 4.0000 mg | Freq: Four times a day (QID) | INTRAMUSCULAR | Status: DC | PRN

## 2013-07-14 MED ORDER — ALPRAZOLAM 0.25 MG PO TABS: 0.2500 mg | ORAL_TABLET | Freq: Two times a day (BID) | ORAL | Status: DC | PRN

## 2013-07-14 MED ORDER — METOPROLOL TARTRATE 25 MG PO TABS
25.0000 mg | ORAL_TABLET | Freq: Two times a day (BID) | ORAL | Status: DC
  Administered 2013-07-14 – 2013-07-15 (×2): 25 mg via ORAL
  Filled 2013-07-14 (×3): qty 1

## 2013-07-14 MED ORDER — INFLUENZA VAC SPLIT QUAD 0.5 ML IM SUSP
0.5000 mL | INTRAMUSCULAR | Status: AC
  Administered 2013-07-15: 0.5 mL via INTRAMUSCULAR
  Filled 2013-07-14: qty 0.5

## 2013-07-14 MED ORDER — SODIUM CHLORIDE 0.9 % IV SOLN: 250.0000 mL | INTRAVENOUS | Status: DC | PRN

## 2013-07-14 MED ORDER — PANTOPRAZOLE SODIUM 40 MG PO TBEC
40.0000 mg | DELAYED_RELEASE_TABLET | Freq: Every day | ORAL | Status: DC
  Administered 2013-07-15: 40 mg via ORAL
  Filled 2013-07-14: qty 1

## 2013-07-14 MED ORDER — SODIUM CHLORIDE 0.9 % IJ SOLN
3.0000 mL | Freq: Two times a day (BID) | INTRAMUSCULAR | Status: DC
  Administered 2013-07-14 – 2013-07-15 (×2): 3 mL via INTRAVENOUS

## 2013-07-14 MED ORDER — ZOLPIDEM TARTRATE 5 MG PO TABS: 5.0000 mg | ORAL_TABLET | Freq: Every evening | ORAL | Status: DC | PRN

## 2013-07-14 NOTE — ED Provider Notes (Signed)
Regina Morales S 8:00 PM patient discussed in signout. Patient with new onset A. fib awaiting cardiology consult. Labs without any other acute findings at this time. Heart rate is controlled.  Cardiology has seen patient and will plan to admit. They will plan to do further testing including echocardiogram and ultrasounds of abdomen and gallbladder per the patient. Patient states she continues to feel slight pressure in the chest area but denies any significant palpitations or other change in symptoms.        Angus Seller, PA-C 07/14/13 2051

## 2013-07-14 NOTE — ED Notes (Addendum)
Pt arrived via [facility], per EMS pt was at work when started developing SOB, CP, and feeling like her "heart was racing." Pt took 324 mg of asprin from a co-worker and started to feel better. Pt has no hx of a-fib, per EMS pt was in a-fib on the monitor en route. Pt is currently being treated for "chest wall pain" by Dr. Clovis Riley, and just started taking diclofenac sodium 75 mg bid as needed. Pt has upcoming appt with Dr. Verdis Prime at [place] on 07/26/13.

## 2013-07-14 NOTE — H&P (Signed)
Patient ID: Regina Morales MRN: [id number], DOB/AGE: Jun 06, 1961   Admit date: 07/14/2013   Primary Physician: Benita Stabile, MD Primary Cardiologist: P. Eden Emms, MD (new)  Pt. Profile:  52 y/o female with prior h/o chest pain s/p neg w/u in 2009 who presented to the ED today with afib rvr.  Problem List  Past Medical History  Diagnosis Date  . Chest pain     a. 2009: neg MV  ([facility])  . GERD (gastroesophageal reflux disease)   . Obesity   . Migraines     Past Surgical History  Procedure Laterality Date  . Appendectomy      early 1990's.  . Right ear surgery      early 2000's.     Allergies  Allergies  Allergen Reactions  . Codeine Nausea And Vomiting    HPI  52 y/o female with prior h/o cp in 2009.  At that time she r/o for MI and was seen by [facility] with apparent negative ischemic w/u.  Since then, she has done reasonably well.  She is on chronic PPI therapy but otherwise does not have a h/o htn, hl, dm, or recurrent chest pain.  She walks ~ 5 days/wk during her lunch break w/o significant limitations.  She was in her USOH until last Thursday 9/4, when she began to experience intermittent left chest squeezing assoc with dyspnea, palpitations, and nausea, occurring exclusively at rest, lasting 1-1.5 hrs, and resolving spontaneously.  Ss were seemingly worse when lying in bed at night and more likely to be associated with the sensation that her heart was pounding. This occurred intermittently throughout the weekend prompting her to be seen @ urgent care on Saturday.  She was placed on Gas-X w/o significant change in Ss.  She saw her PCP on Monday and was placed on diclofenac for some chest wall tenderness and was also referred back to [facility] for f/u later this month.    Today, while sitting at lunch, she had recurrent left sided chest squeezing associated with tachypalpitations and dyspnea.  This episode was worse than prior episodes.  EMS was called  and she was found to be in afib with rvr @ rates between 120 and 130.  She was taken to the [facility] ED where she was treated with ASA.  ECG was w/o acute ST/T changes.  Initial troponin is nl.  She spontaneously converted to sinus rhythm once she was here.  With conversion, she is feeling better.  Home Medications  Prior to Admission medications   Medication Sig Start Date End Date Taking? Authorizing Provider  BUTABARBITAL SODIUM PO Take 1 tablet by mouth daily as needed (migraines).   Yes Historical Provider, MD  Cholecalciferol (VITAMIN D PO) Take 1 tablet by mouth daily.   Yes Historical Provider, MD  diclofenac (VOLTAREN) 75 MG EC tablet Take 75 mg by mouth 2 (two) times daily.   Yes Historical Provider, MD  Multiple Vitamins-Minerals (MULTIVITAMIN WITH MINERALS) tablet Take 1 tablet by mouth daily.   Yes Historical Provider, MD  omeprazole (PRILOSEC) 20 MG capsule Take 20 mg by mouth daily.   Yes Historical Provider, MD    Family History  Family History  Problem Relation Age of Onset  . Lung cancer Father     died @ 18, ETOH abuse  . Kidney failure Mother     s/p renal Tx  . Congestive Heart Failure Mother     died @ 32  . Atrial fibrillation Brother  alive @ 95  . Other Brother     died in MVA   Social History  History   Social History  . Marital Status: Married    Spouse Name: N/A    Number of Children: N/A  . Years of Education: N/A   Occupational History  . Not on file.   Social History Main Topics  . Smoking status: Former Smoker -- 1.00 packs/day for 8 years    Quit date: 11/04/1990  . Smokeless tobacco: Not on file  . Alcohol Use: No  . Drug Use: No  . Sexual Activity: Not on file   Other Topics Concern  . Not on file   Social History Narrative   Lives in [place] with sister.  Does not routinely exercise.  Works in Teacher, early years/pre for [employer].    Review of Systems General:  +++ chills, no fever, night sweats or weight changes.  Cardiovascular:  +++ chest  pain assoc with dyspnea, and palpitations.  No edema, orthopnea, paroxysmal nocturnal dyspnea. Dermatological: No rash, lesions/masses Respiratory: No cough, +++ dyspnea Urologic: No hematuria, dysuria Abdominal:   +++ nausea assoc with c/p.  +++ diarrhea recently.  No vomiting, bright red blood per rectum, melena, or hematemesis Neurologic:  No visual changes, wkns, changes in mental status. All other systems reviewed and are otherwise negative except as noted above.  Physical Exam  Blood pressure 138/88, pulse 100, temperature 98 F (36.7 C), temperature source Oral, resp. rate 23, SpO2 99.00%.  General: Pleasant, NAD Psych: Normal affect. Neuro: Alert and oriented X 3. Moves all extremities spontaneously. HEENT: Normal  Neck: Supple without bruits or JVD. Lungs:  Resp regular and unlabored, CTA. Heart: RRR no s3, s4, or murmurs. Abdomen: Soft, non-tender, non-distended, BS + x 4.  Extremities: No clubbing, cyanosis or edema. DP/PT/Radials 2+ and equal bilaterally.  Labs   Recent Labs  07/14/13 1545  TROPONINI <0.30   Lab Results  Component Value Date   WBC 8.3 07/14/2013   HGB 12.9 07/14/2013   HCT 36.8 07/14/2013   MCV 84.4 07/14/2013   PLT 309 07/14/2013    Recent Labs Lab 07/14/13 1545  NA 140  K 4.1  CL 103  CO2 27  BUN 14  CREATININE 0.73  CALCIUM 9.8  PROT 7.7  BILITOT 0.3  ALKPHOS 99  ALT 41*  AST 29  GLUCOSE 97   Radiology/Studies  Dg Chest 2 View  07/14/2013   *RADIOLOGY REPORT*  Clinical Data: Chest pain and shortness of breath  CHEST - 2 VIEW  Comparison:  Chest CT June 22, 2009  Findings:  There is slight appearance scarring in the right upper lobe in the area of the previously noted ill-defined nodular opacity.  A well-defined nodular opacity is not seen on this study.  There is no frank edema or consolidation.  Heart size and pulmonary vascularity are normal.  No adenopathy.  No bone lesions.  IMPRESSION: Mild scarring right upper lobe in the  area of previously noted nodular opacity.  A well-defined nodular opacity is not seen on this examination.  Elsewhere, lungs are clear.   Original Report Authenticated By: Bretta Bang, M.D.   ECG  Afib, 89, no acute st/t changes --->tele---> sinus rhythm  ASSESSMENT AND PLAN  1.  PAF:  Pt presented earlier today in rapid afib in the setting of a 6 day h/o intermittent left sided chest discomfort associated with dyspnea, nausea, and tachypalpitations.  She spontaneously converted to sinus rhythm while here in the  ED.  With conversion, Ss of chest pain/dyspnea/nausea resolved.  ? If Ss @ home all related to PAF?  Will observe tonight.  Check TSH, echo and add bb therapy.  CHA2DS2VASc = 1.  Will add ASA 81.  Provided w/u unrevealing, will plan to d/c tomorrow and arrange for outpt event monitoring to further assess burden of PAF and determine if Ss of c/p are specifically linked to PAF.  Due to unpredictable nature of her arrhythmia, she will be better served by event monitoring over holter monitoring.   2.  Left chest pain:  In setting of above but possibly occuring in absence of afib as well.  Cycle CE.  Provided that CE remain negative and LV function nl by echo, would plan to d/c tomorrow and arrange for outpt MV to r/o ischemia.  Add asa/bb as above.  No heparin unless she rules in.  Will also check GB u/s.  Signed, Nicolasa Ducking, NP 07/14/2013, 5:38 PM Patient examined chart reviewed.  PAF.  Start beta blocker [place] 1 no anticoagulation Check echo and gallbladder US  Outpatient myovue and event recorder.  D/C in am if maint NSR  [employer]

## 2013-07-14 NOTE — ED Provider Notes (Signed)
CSN: [id number]     Arrival date & time 07/14/13  1355 History   First MD Initiated Contact with Patient 07/14/13 1359     Chief Complaint  Patient presents with  . Chest Pain  . Shortness of Breath   (Consider location/radiation/quality/duration/timing/severity/associated sxs/prior Treatment) HPI Patient is a 52 year old female who presents with episodic chest pain for the past 7 days. She describes the pain as dull and pressure-like. She's had brief episodes of palpitations. She was seen at an urgent care clinic and by her primary doctor last week and told they do not believe that her symptoms related to cardiac disease. Patient has appointment to follow up with Dr. Katrinka Blazing on 07/26/13. Patient states she woke this morning and was feeling in her normal state of health she then went to work and had acute onset dull, non-radiating, central chest pain, irregular palpitations and heart racing and mild shortness of breath. She was given an aspirin by one of her coworkers. EMS was called. EMS run sheet showed ventricular tachycardia. Patient states the palpitations have improved and that she is now only minimally having chest pressure. She has no history of cardiac disease and states she had a full workup 5 years ago that was negative. She denies smoking cigarettes. She denies recent travel or surgeries. She denies lower should the swelling or pain. The patient does have extensive cardiac family history with mother, brother and daughter having coronary arteries disease. No past medical history on file. No past surgical history on file. No family history on file. History  Substance Use Topics  . Smoking status: Not on file  . Smokeless tobacco: Not on file  . Alcohol Use: Not on file   OB History   No data available     Review of Systems  Constitutional: Negative for fever and chills.  Respiratory: Positive for shortness of breath. Negative for cough.   Cardiovascular: Positive for chest pain and  palpitations. Negative for leg swelling.  Gastrointestinal: Negative for nausea, vomiting, abdominal pain, diarrhea and constipation.  Musculoskeletal: Negative for back pain.  Skin: Negative for rash and wound.  Neurological: Positive for light-headedness. Negative for dizziness, weakness, numbness and headaches.    Allergies  Codeine  Home Medications   Current Outpatient Rx  Name  Route  Sig  Dispense  Refill  . BUTABARBITAL SODIUM PO   Oral   Take 1 tablet by mouth daily as needed (migraines).         . Cholecalciferol (VITAMIN D PO)   Oral   Take 1 tablet by mouth daily.         . diclofenac (VOLTAREN) 75 MG EC tablet   Oral   Take 75 mg by mouth 2 (two) times daily.         . Multiple Vitamins-Minerals (MULTIVITAMIN WITH MINERALS) tablet   Oral   Take 1 tablet by mouth daily.         Marland Kitchen omeprazole (PRILOSEC) 20 MG capsule   Oral   Take 20 mg by mouth daily.          BP 150/98  Pulse 87  Temp(Src) 98 F (36.7 C) (Oral)  Resp 19  SpO2 98% Physical Exam  Nursing note and vitals reviewed. Constitutional: She is oriented to person, place, and time. She appears well-developed and well-nourished. No distress.  HENT:  Head: Normocephalic and atraumatic.  Mouth/Throat: Oropharynx is clear and moist.  Eyes: EOM are normal. Pupils are equal, round, and reactive to light.  Neck: Normal range of motion. Neck supple.  Cardiovascular: Normal rate.   Irregularly irregular rhythm  Pulmonary/Chest: Effort normal and breath sounds normal. No respiratory distress. She has no wheezes. She has no rales.  Abdominal: Soft. Bowel sounds are normal. She exhibits no distension and no mass. There is no tenderness. There is no rebound and no guarding.  Musculoskeletal: Normal range of motion. She exhibits no edema and no tenderness.  No calf swelling or tenderness.  Neurological: She is alert and oriented to person, place, and time.  Patient is alert and oriented x3 with  clear, goal oriented speech. Patient has 5/5 motor in all extremities. Sensation is intact to light touch.   Skin: Skin is warm and dry. No rash noted. No erythema.  Psychiatric: She has a normal mood and affect. Her behavior is normal.    ED Course  Procedures (including critical care time) Labs Review Labs Reviewed  CBC WITH DIFFERENTIAL  COMPREHENSIVE METABOLIC PANEL  TROPONIN I  PROTIME-INR  URINALYSIS, ROUTINE W REFLEX MICROSCOPIC  APTT   Imaging Review No results found.  Date: 07/14/2013  Rate: 50  Rhythm: atrial fibrillation  QRS Axis: normal  Intervals: normal  ST/T Wave abnormalities: normal  Conduction Disutrbances:none  Narrative Interpretation:   Old EKG Reviewed: none available   MDM   Discussed with Mariel Craft cardiology. They will see the patient in the ED and disposition.  Loren Racer, MD 07/15/13 1538

## 2013-07-15 ENCOUNTER — Other Ambulatory Visit: Payer: Self-pay | Admitting: Nurse Practitioner

## 2013-07-15 ENCOUNTER — Observation Stay (HOSPITAL_COMMUNITY): Payer: BC Managed Care – PPO

## 2013-07-15 ENCOUNTER — Encounter (HOSPITAL_COMMUNITY): Payer: Self-pay | Admitting: Nurse Practitioner

## 2013-07-15 DIAGNOSIS — I480 Paroxysmal atrial fibrillation: Secondary | ICD-10-CM

## 2013-07-15 DIAGNOSIS — I517 Cardiomegaly: Secondary | ICD-10-CM

## 2013-07-15 DIAGNOSIS — I4891 Unspecified atrial fibrillation: Secondary | ICD-10-CM

## 2013-07-15 DIAGNOSIS — R079 Chest pain, unspecified: Secondary | ICD-10-CM

## 2013-07-15 DIAGNOSIS — Z6841 Body Mass Index (BMI) 40.0 and over, adult: Secondary | ICD-10-CM | POA: Diagnosis present

## 2013-07-15 DIAGNOSIS — K219 Gastro-esophageal reflux disease without esophagitis: Secondary | ICD-10-CM | POA: Diagnosis present

## 2013-07-15 DIAGNOSIS — R0789 Other chest pain: Secondary | ICD-10-CM | POA: Diagnosis present

## 2013-07-15 DIAGNOSIS — E669 Obesity, unspecified: Secondary | ICD-10-CM | POA: Diagnosis present

## 2013-07-15 LAB — LIPID PANEL
Cholesterol: 173 mg/dL (ref 0–200)
LDL Cholesterol: 110 mg/dL — ABNORMAL HIGH (ref 0–99)
Total CHOL/HDL Ratio: 3.9 RATIO
VLDL: 19 mg/dL (ref 0–40)

## 2013-07-15 LAB — TROPONIN I
Troponin I: <0.3 ng/mL (ref <0.30)
Troponin I: <0.3 ng/mL (ref <0.30)

## 2013-07-15 MED ORDER — ASPIRIN 81 MG PO TBEC: 81.0000 mg | DELAYED_RELEASE_TABLET | Freq: Every day | ORAL | Status: DC

## 2013-07-15 MED ORDER — METOPROLOL TARTRATE 25 MG PO TABS: 25.0000 mg | ORAL_TABLET | Freq: Two times a day (BID) | ORAL | Status: DC

## 2013-07-15 NOTE — Progress Notes (Signed)
DC orders received.  Patient stable with no S/s of distress.  Medication and discharge information reviewed with patient and patient's daughter.  Patient DC home. Rigby, Mitzi Hansen

## 2013-07-15 NOTE — Discharge Summary (Signed)
Patient ID: Regina Morales,  MRN: [id number], DOB/AGE: 05-26-1961 52 y.o.  Admit date: 07/14/2013 Discharge date: 07/15/2013  Primary Care Provider: Benita Stabile Primary Cardiologist: P. Eden Emms, MD   Discharge Diagnoses Principal Problem:   PAF (paroxysmal atrial fibrillation)  **broke spontaneously in ER. Active Problems:   Chest pain  **Negative cardiac markers.  Pending exercise cardiolite.   Obesity   GERD (gastroesophageal reflux disease)  Allergies Allergies  Allergen Reactions  . Codeine Nausea And Vomiting   Procedures  2D Echocardiogram 9.11.2014  Study Conclusions  - Left ventricle: The cavity size was normal. Wall thickness   was increased in a pattern of moderate LVH. Systolic   function was normal. The estimated ejection fraction was   in the range of 50% to 55%. Wall motion was normal; there   were no regional wall motion abnormalities. Left   ventricular diastolic function parameters were normal. - Atrial septum: No defect or patent foramen ovale was   identified. _____________  Abdominal Ultrasound 9.11.2014  Impression   There is cholelithiasis.  Numerous calculi are seen within the gallbladder.  There is no ultrasonic evidence of acute cholecystitis.  Bile ducts appeared normal.  There is increased echogenicity of the hepatic parenchymal echotexture.  Most commonly this is associated with hepatic steatosis but is not specific for it.  However, there is decreased attenuation of the liver compared to the spleen on CT performed in 2005 consistent with hepatic steatosis.  No hepatic mass or focal lesion could be identified. _____________   History of Present Illness  52 y/o female with prior h/o cp in 2009. At that time she r/o for MI and was seen by [facility] with apparent negative ischemic w/u. Since then, she has done reasonably well. She is on chronic PPI therapy but otherwise does not have a h/o htn, hl, dm, or recurrent chest  pain. She walks ~ 5 days/wk during her lunch break w/o significant limitations. She was in her USOH until last Thursday 9/4, when she began to experience intermittent left chest squeezing assoc with dyspnea, palpitations, and nausea, occurring exclusively at rest, lasting 1-1.5 hrs, and resolving spontaneously. Ss were seemingly worse when lying in bed at night and more likely to be associated with the sensation that her heart was pounding. This occurred intermittently throughout the weekend prompting her to be seen @ urgent care on Saturday. She was placed on Gas-X w/o significant change in Ss. She saw her PCP on Monday and was placed on diclofenac for some chest wall tenderness and was also referred back to [facility] for f/u later this month.   On the day of admission, while eating lunch, she had recurrent left-sided chest squeezing associated with tachypalpitations and dyspnea.  EMS was eventually called and she was found to be in atrial fibrillation with rapid ventricular response at rates between 120-130. She was taken to the [facility] where she converted spontaneously to sinus rhythm. With conversion, she did feel better. Cardiac markers were negative. She was admitted for observation.  Hospital Course  Patient ruled out for myocardial infarction. She had no further atrial fibrillation or complaints of palpitations. An abdominal ultrasound was performed secondary to complaints of chest and epigastric discomfort and though this did show cholelithiasis, no evidence of cholecystitis was noted. 2-D echocardiogram was performed and showed normal LV function. Patient has been placed on low-dose beta blocker therapy and with a CHA2DS2VASc or 1 (female gender), aspirin. She will be discharged home today in good condition.  We have arranged for outpatient event monitoring to determine her burden of atrial fibrillation and also to evaluate possible association between chest pain and recurrent A. fib. She's  also been arranged for an outpatient exercise Cardiolite to rule out ischemia.  Discharge Vitals Blood pressure 121/58, pulse 63, temperature 98.4 F (36.9 C), temperature source Oral, resp. rate 18, height 5\' 7"  (1.702 m), weight 273 lb 6.4 oz (124.013 kg), SpO2 99.00%.  Filed Weights   07/14/13 2257 07/15/13 0642  Weight: 273 lb 3.2 oz (123.923 kg) 273 lb 6.4 oz (124.013 kg)   Labs  CBC  Recent Labs  07/14/13 1545  WBC 8.3  NEUTROABS 6.3  HGB 12.9  HCT 36.8  MCV 84.4  PLT 309   Basic Metabolic Panel  Recent Labs  07/14/13 1545 07/14/13 2310  NA 140  --   K 4.1  --   CL 103  --   CO2 27  --   GLUCOSE 97  --   BUN 14  --   CREATININE 0.73  --   CALCIUM 9.8  --   MG  --  2.4   Liver Function Tests  Recent Labs  07/14/13 1545  AST 29  ALT 41*  ALKPHOS 99  BILITOT 0.3  PROT 7.7  ALBUMIN 4.3   Cardiac Enzymes  Recent Labs  07/14/13 1545 07/14/13 2310 07/15/13 0440  TROPONINI <0.30 <0.30 <0.30   Fasting Lipid Panel  Recent Labs  07/15/13 0440  CHOL 173  HDL 44  LDLCALC 110*  TRIG 95  CHOLHDL 3.9   Thyroid Function Tests  pending  Disposition  Pt is being discharged home today in good condition.  Follow-up Plans & Appointments  Follow-up Information   Follow up with [name], MD On 08/13/2013. (11:45 AM)    Specialty:  Cardiology   Contact information:   [address] [place] [address] [phone]       Follow up with [facility] On 07/28/2013. (8:15 AM - For exercise stress test.  Nothing to eat or drink after midnight.  Hold metoprolol that morning.)    Contact information:   [address] [place] [address] [phone]      Follow up with [facility]. (We will call you to arrange heart monitor pick-up.)    Contact information:   [address] [place] [address] [phone]     Discharge Medications    Medication List         aspirin 81  MG EC tablet  Take 1 tablet (81 mg total) by mouth daily.     BUTABARBITAL SODIUM PO  Take 1 tablet by mouth daily as needed (migraines).     diclofenac 75 MG EC tablet  Commonly known as:  VOLTAREN  Take 75 mg by mouth 2 (two) times daily.     metoprolol tartrate 25 MG tablet  Commonly known as:  LOPRESSOR  Take 1 tablet (25 mg total) by mouth 2 (two) times daily.     multivitamin with minerals tablet  Take 1 tablet by mouth daily.     omeprazole 20 MG capsule  Commonly known as:  PRILOSEC  Take 20 mg by mouth daily.     VITAMIN D PO  Take 1 tablet by mouth daily.       Outstanding Labs/Studies  21 day event monitor and exercise cardiolite  Duration of Discharge Encounter   Greater than 30 minutes including physician time.  Signed, Nicolasa Ducking NP 07/15/2013, 9:34 AM

## 2013-07-15 NOTE — Progress Notes (Signed)
Patient Name: Regina Morales Date of Encounter: 07/15/2013   Principal Problem:   PAF (paroxysmal atrial fibrillation) Active Problems:   Chest pain   Obesity   GERD (gastroesophageal reflux disease)   SUBJECTIVE  No chest pain or tachypalps overnight.  Maintaining sinus.  CURRENT MEDS . aspirin EC  81 mg Oral Daily  . influenza vac split quadrivalent PF  0.5 mL Intramuscular Tomorrow-1000  . metoprolol tartrate  25 mg Oral BID  . pantoprazole  40 mg Oral Daily  . sodium chloride  3 mL Intravenous Q12H   OBJECTIVE  Filed Vitals:   07/14/13 2030 07/14/13 2045 07/14/13 2257 07/15/13 0642  BP: 125/75  141/74 121/58  Pulse: 79 74 74 63  Temp:   98.3 F (36.8 C) 98.4 F (36.9 C)  TempSrc:   Oral Oral  Resp: 20 17 18 18   Height:   5\' 7"  (1.702 m)   Weight:   273 lb 3.2 oz (123.923 kg) 273 lb 6.4 oz (124.013 kg)  SpO2: 99% 100% 100% 99%    Intake/Output Summary (Last 24 hours) at 07/15/13 0720 Last data filed at 07/14/13 2310  Gross per 24 hour  Intake      3 ml  Output      0 ml  Net      3 ml   Filed Weights   07/14/13 2257 07/15/13 0642  Weight: 273 lb 3.2 oz (123.923 kg) 273 lb 6.4 oz (124.013 kg)    PHYSICAL EXAM  General: Pleasant, NAD. Neuro: Alert and oriented X 3. Moves all extremities spontaneously. Psych: Normal affect. HEENT:  Normal  Neck: Supple without bruits or JVD. Lungs:  Resp regular and unlabored, CTA. Heart: RRR no s3, s4, or murmurs. Abdomen: Soft, non-tender, non-distended, BS + x 4.  Extremities: No clubbing, cyanosis or edema. DP/PT/Radials 2+ and equal bilaterally.  Accessory Clinical Findings  CBC  Recent Labs  07/14/13 1545  WBC 8.3  NEUTROABS 6.3  HGB 12.9  HCT 36.8  MCV 84.4  PLT 309   Basic Metabolic Panel  Recent Labs  07/14/13 1545 07/14/13 2310  NA 140  --   K 4.1  --   CL 103  --   CO2 27  --   GLUCOSE 97  --   BUN 14  --   CREATININE 0.73  --   CALCIUM 9.8  --   MG  --  2.4   Liver Function  Tests  Recent Labs  07/14/13 1545  AST 29  ALT 41*  ALKPHOS 99  BILITOT 0.3  PROT 7.7  ALBUMIN 4.3   Cardiac Enzymes  Recent Labs  07/14/13 1545 07/14/13 2310 07/15/13 0440  TROPONINI <0.30 <0.30 <0.30    Fasting Lipid Panel  Recent Labs  07/15/13 0440  CHOL 173  HDL 44  LDLCALC 110*  TRIG 95  CHOLHDL 3.9   Thyroid Function Tests pending  TELE  rsr  Radiology/Studies  Dg Chest 2 View  07/14/2013   *RADIOLOGY REPORT*  Clinical Data: Chest pain and shortness of breath  CHEST - 2 VIEW  Comparison:  Chest CT June 22, 2009  Findings:  There is slight appearance scarring in the right upper lobe in the area of the previously noted ill-defined nodular opacity.  A well-defined nodular opacity is not seen on this study.  There is no frank edema or consolidation.  Heart size and pulmonary vascularity are normal.  No adenopathy.  No bone lesions.  IMPRESSION: Mild scarring right upper  lobe in the area of previously noted nodular opacity.  A well-defined nodular opacity is not seen on this examination.  Elsewhere, lungs are clear.   Original Report Authenticated By: Bretta Bang, M.D.   ASSESSMENT AND PLAN  1.  PAF:  Maintaining sinus.  Tolerating BB.  TSH and echo pending today.  Provided that echo is nl, will plan d/c later today with outpt event monitor to assess burden of afib and try and determine if c/p specifically linked to PAF.  CHA2DS2VASc=1 (gender).  Cont ASA.  2.  Chest Pain:  Typical/atypical features.  Often linked to tachypalpitations.  Did not have either last night.  CE negative, thus doubt ischemia in setting of prolonged duration of c/p yesterday.  Provided that EF nl on echo, will plan outpt mv.  3.  GERD:  Cont ppi.  4.  Obesity:  Will ask dietician to see.  Signed, Nicolasa Ducking NP  Patient examined chart reviewed agree with above Have called echo lab to expidite.   F/U with me

## 2013-07-15 NOTE — Progress Notes (Signed)
  Echocardiogram 2D Echocardiogram has been performed.  Regina Morales 07/15/2013, 11:11 AM

## 2013-07-19 ENCOUNTER — Encounter (INDEPENDENT_AMBULATORY_CARE_PROVIDER_SITE_OTHER): Payer: BC Managed Care – PPO

## 2013-07-19 ENCOUNTER — Encounter: Payer: Self-pay | Admitting: *Deleted

## 2013-07-19 DIAGNOSIS — I4891 Unspecified atrial fibrillation: Secondary | ICD-10-CM

## 2013-07-19 DIAGNOSIS — I480 Paroxysmal atrial fibrillation: Secondary | ICD-10-CM

## 2013-07-19 NOTE — Progress Notes (Signed)
Patient ID: Regina Morales, female   DOB: 26-Jan-1961, 52 y.o.   MRN: [id number] E-Cardio verite 21 day cardiac event monitor applied to patient.

## 2013-07-28 ENCOUNTER — Encounter (HOSPITAL_COMMUNITY): Payer: BC Managed Care – PPO

## 2013-08-02 ENCOUNTER — Ambulatory Visit (HOSPITAL_COMMUNITY): Payer: BC Managed Care – PPO | Attending: Cardiology | Admitting: Radiology

## 2013-08-02 VITALS — BP 123/69 | Ht 67.0 in | Wt 267.0 lb

## 2013-08-02 DIAGNOSIS — I480 Paroxysmal atrial fibrillation: Secondary | ICD-10-CM

## 2013-08-02 DIAGNOSIS — R110 Nausea: Secondary | ICD-10-CM | POA: Insufficient documentation

## 2013-08-02 DIAGNOSIS — R0602 Shortness of breath: Secondary | ICD-10-CM | POA: Insufficient documentation

## 2013-08-02 DIAGNOSIS — I4891 Unspecified atrial fibrillation: Secondary | ICD-10-CM | POA: Insufficient documentation

## 2013-08-02 DIAGNOSIS — Z8249 Family history of ischemic heart disease and other diseases of the circulatory system: Secondary | ICD-10-CM | POA: Insufficient documentation

## 2013-08-02 DIAGNOSIS — R079 Chest pain, unspecified: Secondary | ICD-10-CM | POA: Insufficient documentation

## 2013-08-02 DIAGNOSIS — R002 Palpitations: Secondary | ICD-10-CM | POA: Insufficient documentation

## 2013-08-02 DIAGNOSIS — Z87891 Personal history of nicotine dependence: Secondary | ICD-10-CM | POA: Insufficient documentation

## 2013-08-02 MED ORDER — TECHNETIUM TC 99M SESTAMIBI GENERIC - CARDIOLITE
33.0000 | Freq: Once | INTRAVENOUS | Status: AC | PRN
  Administered 2013-08-02: 33 via INTRAVENOUS

## 2013-08-02 NOTE — Progress Notes (Signed)
[facility] SITE 3 NUCLEAR MED [address] [place], [place] [address] [phone]    Cardiology Nuclear Med Study  Regina Morales is a 52 y.o. female     MRN : [id number]     DOB: 1961/02/08  Procedure Date: 08/02/2013  Nuclear Med Background Indication for Stress Test:  Evaluation for Ischemia, and 07-14-13 Chest Pain, AFIB, (-) enzymes History:  AFIB RVR 9/14 ECHO: EF: 50-55% [place] Cardiac Risk Factors: Family History - CAD and History of Smoking  Symptoms:  Chest Pain, Nausea, Palpitations and SOB   Nuclear Pre-Procedure Caffeine/Decaff Intake:  None > 12 hrs NPO After: 7:30pm   Lungs:  clear O2 Sat: 98% on room air. IV 0.9% NS with Angio Cath:  22g  IV Site: L Antecubital x 1, tolerated well IV Started by:  Irean Hong, RN  Chest Size (in):  42 Cup Size: C  Height: 5\' 7"  (1.702 m)  Weight:  267 lb (121.11 kg)  BMI:  Body mass index is 41.81 kg/(m^2). Tech Comments:  Took lopressor at [employer]  today    Nuclear Med Study 1 or 2 day study: 2 day  Stress Test Type:  Stress  Reading MD: Cassell Clement, MD  Order Authorizing Provider:  [name], MD  Resting Radionuclide: Technetium 35m Sestamibi  Resting Radionuclide Dose: 33.0 mCi on 08/03/13   Stress Radionuclide:  Technetium 55m Sestamibi  Stress Radionuclide Dose: 33.0 mCi on 08/02/13           Stress Protocol Rest HR: 57 Stress HR: 151  Rest BP: 123/69 Stress BP: 169/63  Exercise Time (min): 6:30 METS: 7.70   Predicted Max HR: 170 bpm % Max HR: 88.82 bpm Rate Pressure Product: 44010   Dose of Adenosine (mg):  n/a Dose of Lexiscan: n/a mg  Dose of Atropine (mg): n/a Dose of Dobutamine: n/a mcg/kg/min (at max HR)  Stress Test Technologist: Milana Na, EMT-P  Nuclear Technologist:  Doyne Keel, CNMT     Rest Procedure:  Myocardial perfusion imaging was performed at rest 45 minutes following the intravenous administration of Technetium 28m Sestamibi. Rest ECG: NSR - Normal EKG  Stress  Procedure:  The patient exercised on the treadmill utilizing the Bruce Protocol for 6:30 minutes. The patient stopped due to sob, fatigue, and denied any chest pain.  Technetium 49m Sestamibi was injected at peak exercise and myocardial perfusion imaging was performed after a brief delay. Stress ECG: Insignificant upsloping ST segment depression.  QPS Raw Data Images:  Normal; no motion artifact; normal heart/lung ratio. Stress Images:  There is decreased uptake in the apex. Rest Images:  Normal homogeneous uptake in all areas of the myocardium. Subtraction (SDS):  There is a small reversible apical defect of mild severity.  This may represent apical thinning but is not present on the resting study. Transient Ischemic Dilatation (Normal <1.22):  n/a Lung/Heart Ratio (Normal <0.45):  0.45  Quantitative Gated Spect Images QGS EDV:  139 ml QGS ESV:  61 ml  Impression Exercise Capacity:  Good exercise capacity. BP Response:  Normal blood pressure response. Clinical Symptoms:  No significant symptoms noted. ECG Impression:  No significant ST segment change suggestive of ischemia. Comparison with Prior Nuclear Study: No images to compare  Overall Impression:  Low risk stress nuclear study. There is a small reversible apical defect of mild severity.  This may represent apical thinning but is not present on the resting study. Left ventricular function is normal.  LV Ejection Fraction: 57%.  LV Wall Motion:  NL LV Function; NL Wall Motion  [employer]

## 2013-08-03 ENCOUNTER — Ambulatory Visit (HOSPITAL_COMMUNITY): Payer: BC Managed Care – PPO | Attending: Cardiology

## 2013-08-03 DIAGNOSIS — R0989 Other specified symptoms and signs involving the circulatory and respiratory systems: Secondary | ICD-10-CM

## 2013-08-03 MED ORDER — TECHNETIUM TC 99M SESTAMIBI GENERIC - CARDIOLITE
33.0000 | Freq: Once | INTRAVENOUS | Status: AC | PRN
  Administered 2013-08-03: 33 via INTRAVENOUS

## 2013-08-05 ENCOUNTER — Telehealth: Payer: Self-pay | Admitting: *Deleted

## 2013-08-05 DIAGNOSIS — I4891 Unspecified atrial fibrillation: Secondary | ICD-10-CM

## 2013-08-05 DIAGNOSIS — Z79899 Other long term (current) drug therapy: Secondary | ICD-10-CM

## 2013-08-05 NOTE — Telephone Encounter (Signed)
         [place], Zi A ','<More Detail >>       Wendall Stade, MD       Sent: Wed August 04, 2013 9:47 PM    To: Alois Cliche, LPN                   Message     Ok to start flecainide F/U ETT to r/o proarrhythmia in 2 week after starting drug    ----- Message -----    From: Alois Cliche, LPN    Sent: 16/11/958 10:06 AM    To: Wendall Stade, MD        STATED IF THIS WAS OKAY PT TO START FLECAINIDE ./CY                 Attached Reports    The sender attached the following reports to this message:                BUSY  SIGNAL WILL TRY AGAIN

## 2013-08-06 MED ORDER — FLECAINIDE ACETATE 50 MG PO TABS: 50.0000 mg | ORAL_TABLET | Freq: Two times a day (BID) | ORAL | Status: DC

## 2013-08-06 NOTE — Telephone Encounter (Signed)
LMTCB ./CY 

## 2013-08-06 NOTE — Telephone Encounter (Signed)
New Problem  Pt returning your call// Pt is at work will be hard for her to answer.. Please leave message on voicemail.

## 2013-08-06 NOTE — Telephone Encounter (Signed)
PT  AWARE./CY 

## 2013-08-13 ENCOUNTER — Encounter: Payer: BC Managed Care – PPO | Admitting: Cardiovascular Disease

## 2013-08-16 ENCOUNTER — Ambulatory Visit (INDEPENDENT_AMBULATORY_CARE_PROVIDER_SITE_OTHER): Payer: BC Managed Care – PPO | Admitting: Cardiovascular Disease

## 2013-08-16 ENCOUNTER — Encounter: Payer: Self-pay | Admitting: Cardiovascular Disease

## 2013-08-16 VITALS — BP 124/86 | HR 86 | Ht 67.0 in | Wt 268.0 lb

## 2013-08-16 DIAGNOSIS — I4891 Unspecified atrial fibrillation: Secondary | ICD-10-CM

## 2013-08-16 DIAGNOSIS — K219 Gastro-esophageal reflux disease without esophagitis: Secondary | ICD-10-CM

## 2013-08-16 DIAGNOSIS — R079 Chest pain, unspecified: Secondary | ICD-10-CM

## 2013-08-16 DIAGNOSIS — I480 Paroxysmal atrial fibrillation: Secondary | ICD-10-CM

## 2013-08-16 NOTE — Assessment & Plan Note (Signed)
Resolved Likely related to rapid afib Myovue normal

## 2013-08-16 NOTE — Progress Notes (Signed)
Patient ID: Regina Morales, female   DOB: 1961/10/13, 52 y.o.   MRN: [id number] 52 yo seen in [facility] September for chest pain and PAF  Converted with meds.  F/U myovue normal  Maint NSR on flecainide and beta blockade  Echo 07/15/13 reviewed and normal LA size.     Study Conclusions  - Left ventricle: The cavity size was normal. Wall thickness was increased in a pattern of moderate LVH. Systolic function was normal. The estimated ejection fraction was in the range of 50% to 55%. Wall motion was normal; there were no regional wall motion abnormalities. Left ventricular diastolic function parameters were normal. - Atrial septum: No defect or patent foramen ovale was identified.  Has daughter who is sudden death survivor with AICD   ROS: Denies fever, malais, weight loss, blurry vision, decreased visual acuity, cough, sputum, SOB, hemoptysis, pleuritic pain, palpitaitons, heartburn, abdominal pain, melena, lower extremity edema, claudication, or rash.  All other systems reviewed and negative  General: Affect appropriate Healthy:  appears stated age HEENT: normal Neck supple with no adenopathy JVP normal no bruits no thyromegaly Lungs clear with no wheezing and good diaphragmatic motion Heart:  S1/S2 no murmur, no rub, gallop or click PMI normal Abdomen: benighn, BS positve, no tenderness, no AAA no bruit.  No HSM or HJR Distal pulses intact with no bruits No edema Neuro non-focal Skin warm and dry No muscular weakness   Current Outpatient Prescriptions  Medication Sig Dispense Refill  . aspirin EC 81 MG EC tablet Take 1 tablet (81 mg total) by mouth daily.      Marland Kitchen BUTABARBITAL SODIUM PO Take 1 tablet by mouth daily as needed (migraines).      . Cholecalciferol (VITAMIN D PO) Take 2 tablets by mouth daily.       . flecainide (TAMBOCOR) 50 MG tablet Take 1 tablet (50 mg total) by mouth 2 (two) times daily.  180 tablet  3  . metoprolol tartrate (LOPRESSOR) 25 MG tablet Take 1  tablet (25 mg total) by mouth 2 (two) times daily.  60 tablet  6  . Multiple Vitamins-Minerals (MULTIVITAMIN WITH MINERALS) tablet Take 1 tablet by mouth daily.      Marland Kitchen omeprazole (PRILOSEC) 20 MG capsule Take 20 mg by mouth daily.       No current facility-administered medications for this visit.    Allergies  Codeine  Electrocardiogram:  07/16/13  Afib rate 89 nonspecific ST/T wave changes   Assessment and Plan

## 2013-08-16 NOTE — Assessment & Plan Note (Signed)
Discussed low carb diet and weight loss Continue protonix  F/U Lupe Carney

## 2013-08-16 NOTE — Assessment & Plan Note (Signed)
No palpitations or pain on beta blockers and flecainide  F/U 6 months

## 2013-08-16 NOTE — Patient Instructions (Signed)
Your physician wants you to follow-up in:  6 MONTHS WITH DR NISHAN  You will receive a reminder letter in the mail two months in advance. If you don't receive a letter, please call our office to schedule the follow-up appointment. Your physician recommends that you continue on your current medications as directed. Please refer to the Current Medication list given to you today. 

## 2013-08-24 ENCOUNTER — Ambulatory Visit (HOSPITAL_COMMUNITY): Payer: BC Managed Care – PPO | Attending: Cardiology

## 2013-08-24 ENCOUNTER — Ambulatory Visit (HOSPITAL_BASED_OUTPATIENT_CLINIC_OR_DEPARTMENT_OTHER): Payer: BC Managed Care – PPO | Admitting: Nurse Practitioner

## 2013-08-24 DIAGNOSIS — Z79899 Other long term (current) drug therapy: Secondary | ICD-10-CM

## 2013-08-24 DIAGNOSIS — R0989 Other specified symptoms and signs involving the circulatory and respiratory systems: Secondary | ICD-10-CM

## 2013-08-24 DIAGNOSIS — I4891 Unspecified atrial fibrillation: Secondary | ICD-10-CM

## 2013-08-24 NOTE — Progress Notes (Signed)
Exercise Treadmill Test  Pre-Exercise Testing Evaluation Rhythm: normal sinus  Rate: 74   PR:  .20 QRS:  .09  QT:  .38 QTc: .43           Test  Exercise Tolerance Test Ordering MD: [name], MD  Interpreting MD: [name], MD  Unique Test No: 1  Treadmill:  2  Indication for ETT: Recently started Flecanide  Contraindication to ETT: No   Stress Modality: exercise - treadmill  Cardiac Imaging Performed: non   Protocol: standard Bruce - maximal  Max BP:  166/57  Max MPHR (bpm):  170 85% MPR (bpm):  145  MPHR obtained (bpm):  160 % MPHR obtained:  92%  Reached 85% MPHR (min:sec):  6:20 Total Exercise Time (min-sec):  7:59  Workload in METS:  10 Borg Scale: 16  Reason ETT Terminated:  patient's desire to stop    ST Segment Analysis At Rest: non-specific ST segment slurring  Exercise: no evidence of significant ST depression - had some upsloping ST depression during stress, no downsloping depression or ST elevation  Other Information Arrhythmia:  No Angina during ETT:  absent (0) Quality of ETT:  diagnostic  ETT Interpretation:  normal - no evidence of ischemia by ST analysis  Comments: Pt had taken home medications including BB and Flecainide this am. 85% HR goal exceeded. Pt had about 1 minute of chest pressure after getting off treadmill, otherwise only SOB and leg fatigue. No arrhythmia seen.  Recommendations: Continue Flecainide.

## 2013-08-25 ENCOUNTER — Telehealth: Payer: Self-pay

## 2013-08-25 NOTE — Telephone Encounter (Signed)
FORMS FILLED  OUT  AND LEFT  WITH MEDICAL  RECORDS .Regina Morales

## 2013-08-25 NOTE — Telephone Encounter (Signed)
New problem    patient was in office on  10/13 ,FM [place] paperwork was given to [employer]. In the office on yesterday to check on the status.   FM LA paperwork for 10/17 was not sent in. Clarification on two questions.    Had a file - new claim # on paperwork  [phone]. Deadline two weeks.

## 2013-09-01 ENCOUNTER — Other Ambulatory Visit: Payer: Self-pay | Admitting: *Deleted

## 2013-09-01 ENCOUNTER — Other Ambulatory Visit: Payer: Self-pay

## 2013-09-01 MED ORDER — METOPROLOL TARTRATE 25 MG PO TABS: 25.0000 mg | ORAL_TABLET | Freq: Two times a day (BID) | ORAL | Status: DC

## 2013-09-01 MED ORDER — FLECAINIDE ACETATE 50 MG PO TABS: 50.0000 mg | ORAL_TABLET | Freq: Two times a day (BID) | ORAL | Status: DC

## 2013-10-31 ENCOUNTER — Telehealth: Payer: Self-pay | Admitting: Nurse Practitioner

## 2013-10-31 NOTE — Telephone Encounter (Signed)
Pt called this AM because she has noted that her resting HR is in the 90's whereas it is usually in the 60's.  She cannot tell if her rhythm is irregular or not, thus she doesn't know if she is back in afib.  She is nervous about her HR being up though.  She took her flecainide and metoprolol (25mg ) earlier this AM and would like to take another metoprolol now.  Her BP was "high" on her home cuff this morning.  I advised that she may go ahead and take an additional metoprolol 25mg  now.  If her HR remains elevated later today, she should either present to a local urgent care or ED for an ECG to document her rhythm.  If rate improves and she is asymptomatic, she should call the office tomorrow for f/u.  She verbalized understanding.

## 2013-11-11 ENCOUNTER — Telehealth: Payer: Self-pay | Admitting: Cardiovascular Disease

## 2013-11-11 NOTE — Telephone Encounter (Signed)
SPOKE WITH  PT  HAS NOTED  5-6  EPISODES  OF  FAST HEART RATE  SINCE  STARTING  SERTRALINE ON  10-18-13 PER PT  NORMALLY MAY   HAVE  2-3  EPISODES PER MONTH  HEART R ATE  NORMALLY RUNS   IN THE  50'S  BUT NOTES  WHEN  HAVING EPISODES  MAY GET  AS HIGH AS IN THE  90'S  DISCUSSED WITH PT TO  CALL DR MITCHELL'S OFFICE  TO  SEE  IF  CAN  JUST  STOP  MED (SERTRALINE) OR  WHAT PROTOCOL MAY BE AS  DR MITCHELL  WOULD  BE THE  ONE TO START  NEW MED FOR  DEPRESSION  ALSO  INSTRUCTED   IF  STILL  IS HAVING  FREQUENT  EPISODES  AFTER STOPPING MED TO CALL  BACK . WILL FORWARD TO DR Eden EmmsNISHAN FOR REVIEW /CY

## 2013-11-11 NOTE — Telephone Encounter (Signed)
New Problem:  Pt states she has been experiencing some a-fib.. Pt states she noticed it after she started sertraline Rx... PT is wondering if that medication could be causing it or if it is coming from something else.Marland Kitchen..Marland Kitchen

## 2014-01-19 ENCOUNTER — Emergency Department (HOSPITAL_COMMUNITY)
Admission: EM | Admit: 2014-01-19 | Discharge: 2014-01-19 | Disposition: A | Discharge status: Home / Self Care | Payer: BC Managed Care – PPO | Source: Home / Self Care | Attending: Family Medicine | Admitting: Family Medicine

## 2014-01-19 ENCOUNTER — Encounter (HOSPITAL_COMMUNITY): Payer: Self-pay | Admitting: Emergency Medicine

## 2014-01-19 DIAGNOSIS — K0889 Other specified disorders of teeth and supporting structures: Secondary | ICD-10-CM

## 2014-01-19 DIAGNOSIS — K089 Disorder of teeth and supporting structures, unspecified: Secondary | ICD-10-CM

## 2014-01-19 MED ORDER — AMOXICILLIN 500 MG PO CAPS: 500.0000 mg | ORAL_CAPSULE | Freq: Three times a day (TID) | ORAL | Status: DC

## 2014-01-19 MED ORDER — TRAMADOL HCL 50 MG PO TABS: 50.0000 mg | ORAL_TABLET | Freq: Four times a day (QID) | ORAL | Status: DC | PRN

## 2014-01-19 NOTE — ED Notes (Signed)
Pt c/o dental pain onset yest Taking tyle and ibup w/no relief Alert w/no signs of acute distress.

## 2014-01-19 NOTE — Discharge Instructions (Signed)
Thank you for coming in today. Follow up with a dentist ASAP.  Use tramadol for severe pain.  Take amoxicillin three times daily.    Dental Pain A tooth ache may be caused by cavities (tooth decay). Cavities expose the nerve of the tooth to air and hot or cold temperatures. It may come from an infection or abscess (also called a boil or furuncle) around your tooth. It is also often caused by dental caries (tooth decay). This causes the pain you are having. DIAGNOSIS  Your caregiver can diagnose this problem by exam. TREATMENT   If caused by an infection, it may be treated with medications which kill germs (antibiotics) and pain medications as prescribed by your caregiver. Take medications as directed.  Only take over-the-counter or prescription medicines for pain, discomfort, or fever as directed by your caregiver.  Whether the tooth ache today is caused by infection or dental disease, you should see your dentist as soon as possible for further care. SEEK MEDICAL CARE IF: The exam and treatment you received today has been provided on an emergency basis only. This is not a substitute for complete medical or dental care. If your problem worsens or new problems (symptoms) appear, and you are unable to meet with your dentist, call or return to this location. SEEK IMMEDIATE MEDICAL CARE IF:   You have a fever.  You develop redness and swelling of your face, jaw, or neck.  You are unable to open your mouth.  You have severe pain uncontrolled by pain medicine. MAKE SURE YOU:   Understand these instructions.  Will watch your condition.  Will get help right away if you are not doing well or get worse. Document Released: 10/21/2005 Document Revised: 01/13/2012 Document Reviewed: 06/08/2008 [facility] Patient Information 2014 [employer] [place].

## 2014-01-19 NOTE — ED Provider Notes (Signed)
Regina Morales is a 53 y.o. female who presents to Urgent Care today for dental pain. Patient developed right lower frontal dental pain yesterday. She has had a broken tooth for a few days. She has an appointment with her dentist in 10 days. She's tried multiple over-the-counter medications which have not helped much. No fevers or chills nausea vomiting or diarrhea. Pain is severe and worse with eating.   Past Medical History  Diagnosis Date  . Chest pain     a. 2009: neg MV  ([facility])  . GERD (gastroesophageal reflux disease)   . Obesity   . Migraines   . PAF (paroxysmal atrial fibrillation)     a. Dx 07/2013;  b. CHA2DS2VASc=1 (female);  c. 07/2013 Echo:  EF 50-55%, no rwma, mod LVH.  Marland Kitchen. Hepatic steatosis    History  Substance Use Topics  . Smoking status: Former Smoker -- 1.00 packs/day for 8 years    Quit date: 11/04/1990  . Smokeless tobacco: Not on file  . Alcohol Use: No   ROS as above Medications: No current facility-administered medications for this encounter.   Current Outpatient Prescriptions  Medication Sig Dispense Refill  . amoxicillin (AMOXIL) 500 MG capsule Take 1 capsule (500 mg total) by mouth 3 (three) times daily.  30 capsule  0  . aspirin EC 81 MG EC tablet Take 1 tablet (81 mg total) by mouth daily.      Marland Kitchen. BUTABARBITAL SODIUM PO Take 1 tablet by mouth daily as needed (migraines).      . Cholecalciferol (VITAMIN D PO) Take 2 tablets by mouth daily.       . flecainide (TAMBOCOR) 50 MG tablet Take 1 tablet (50 mg total) by mouth 2 (two) times daily.  180 tablet  1  . metoprolol tartrate (LOPRESSOR) 25 MG tablet Take 1 tablet (25 mg total) by mouth 2 (two) times daily.  180 tablet  1  . Multiple Vitamins-Minerals (MULTIVITAMIN WITH MINERALS) tablet Take 1 tablet by mouth daily.      Marland Kitchen. omeprazole (PRILOSEC) 20 MG capsule Take 20 mg by mouth daily.      . traMADol (ULTRAM) 50 MG tablet Take 1 tablet (50 mg total) by mouth every 6 (six) hours as needed.  25 tablet  0     Exam:  BP 152/76  Pulse 69  Temp(Src) 98.2 F (36.8 C) (Oral)  Resp 17  SpO2 100% Gen: Well NAD HEENT: EOMI,  MMM broken right lower canine tooth. Tender to touch with gumline erythema. No jaw swelling Lungs: Normal work of breathing. CTABL Heart: RRR no MRG Exts: Brisk capillary refill, warm and well perfused.   Dental injection: Consent obtained Topical numbing medicine applied to the base of the tooth 1.8 mL of Marcaine and epinephrine were injected into the base of the tooth at the junction of the gum and cheek achieving good anesthesia. Patient tolerated procedure well.   Assessment and Plan: 53 y.o. female with dental pain. Fractured an infected tooth. Plan to treat with amoxicillin and tramadol. Followup with dentist as soon as possible.  Discussed warning signs or symptoms. Please see discharge instructions. Patient expresses understanding.    Rodolph BongEvan S Aleila Syverson, MD 01/19/14 [phone]

## 2014-02-14 ENCOUNTER — Ambulatory Visit (INDEPENDENT_AMBULATORY_CARE_PROVIDER_SITE_OTHER): Payer: BC Managed Care – PPO | Admitting: Cardiovascular Disease

## 2014-02-14 ENCOUNTER — Encounter: Payer: Self-pay | Admitting: Cardiovascular Disease

## 2014-02-14 VITALS — BP 130/80 | HR 75 | Ht 67.0 in | Wt 271.0 lb

## 2014-02-14 DIAGNOSIS — I480 Paroxysmal atrial fibrillation: Secondary | ICD-10-CM

## 2014-02-14 DIAGNOSIS — R079 Chest pain, unspecified: Secondary | ICD-10-CM

## 2014-02-14 DIAGNOSIS — I4891 Unspecified atrial fibrillation: Secondary | ICD-10-CM

## 2014-02-14 MED ORDER — METOPROLOL TARTRATE 25 MG PO TABS: 25.0000 mg | ORAL_TABLET | Freq: Two times a day (BID) | ORAL | Status: DC

## 2014-02-14 MED ORDER — FLECAINIDE ACETATE 50 MG PO TABS: 50.0000 mg | ORAL_TABLET | Freq: Two times a day (BID) | ORAL | Status: DC

## 2014-02-14 NOTE — Addendum Note (Signed)
**Note De-Identified Caidence Kaseman Obfuscation** Addended by: Demetrios LollVIA, Yunuen Mordan M on: 02/14/2014 04:54 PM   Modules accepted: Orders

## 2014-02-14 NOTE — Assessment & Plan Note (Signed)
Maint NSR  Refills on meds given

## 2014-02-14 NOTE — Progress Notes (Signed)
Patient ID: Regina Morales, female   DOB: 02/06/1961, 53 y.o.   MRN: [id number] 53 [facility] and [facility] Converted with meds. F/U myovue normal Maint NSR on flecainide and beta blockade Echo 07/15/13 reviewed and normal LA size.  Study Conclusions  - Left ventricle: The cavity size was normal. Wall thickness was increased in a pattern of moderate LVH. Systolic function was normal. The estimated ejection fraction was in the range of 50% to 55%. Wall motion was normal; there were no regional wall motion abnormalities. Left ventricular diastolic function parameters were normal. - Atrial septum: No defect or patent foramen ovale was identified.  Has daughter who is sudden death survivor with AICD  F/U ETT on flecainide with no proarrrhythmia No palpitations  Compliant with meds Needs ECG as last one in 2014 was afib   Still working at [employer]  Has Monday and Tuesdays off spends time with her 4 local grandkids  Myia was with her today   ROS: Denies fever, malais, weight loss, blurry vision, decreased visual acuity, cough, sputum, SOB, hemoptysis, pleuritic pain, palpitaitons, heartburn, abdominal pain, melena, lower extremity edema, claudication, or rash.  All other systems reviewed and negative  General: Affect appropriate Obese white female  HEENT: normal Neck supple with no adenopathy JVP normal no bruits no thyromegaly Lungs clear with no wheezing and good diaphragmatic motion Heart:  S1/S2 no murmur, no rub, gallop or click PMI normal Abdomen: benighn, BS positve, no tenderness, no AAA no bruit.  No HSM or HJR Distal pulses intact with no bruits No edema Neuro non-focal Skin warm and dry No muscular weakness   Current Outpatient Prescriptions  Medication Sig Dispense Refill  . aspirin EC 81 MG EC tablet Take 1 tablet (81 mg total) by mouth daily.      Marland Kitchen. buPROPion (WELLBUTRIN XL) 150 MG 24 hr tablet Take 150 mg by mouth daily.      Marland Kitchen. BUTABARBITAL  SODIUM PO Take 1 tablet by mouth daily as needed (migraines).      . Cholecalciferol (VITAMIN D PO) Take 2 tablets by mouth daily.       . flecainide (TAMBOCOR) 50 MG tablet Take 1 tablet (50 mg total) by mouth 2 (two) times daily.  180 tablet  1  . metoprolol tartrate (LOPRESSOR) 25 MG tablet Take 1 tablet (25 mg total) by mouth 2 (two) times daily.  180 tablet  1  . Multiple Vitamins-Minerals (MULTIVITAMIN WITH MINERALS) tablet Take 1 tablet by mouth daily.      Marland Kitchen. omeprazole (PRILOSEC) 20 MG capsule Take 20 mg by mouth daily.       No current facility-administered medications for this visit.    Allergies  Codeine  Electrocardiogram:  NSR normal ECG   Assessment and Plan

## 2014-02-14 NOTE — Assessment & Plan Note (Signed)
Resolved  Normal myovue and ETT  Stable

## 2014-02-14 NOTE — Patient Instructions (Signed)
**Note De-identified  Obfuscation** Your physician recommends that you continue on your current medications as directed. Please refer to the Current Medication list given to you today.  Your physician wants you to follow-up in: 1 year. You will receive a reminder letter in the mail two months in advance. If you don't receive a letter, please call our office to schedule the follow-up appointment.  

## 2014-04-22 ENCOUNTER — Emergency Department (HOSPITAL_COMMUNITY): Payer: BC Managed Care – PPO

## 2014-04-22 ENCOUNTER — Observation Stay (HOSPITAL_COMMUNITY)
Admission: EM | Admit: 2014-04-22 | Discharge: 2014-04-24 | Disposition: A | Discharge status: Home / Self Care | Payer: BC Managed Care – PPO | Attending: Internal Medicine | Admitting: Internal Medicine

## 2014-04-22 ENCOUNTER — Other Ambulatory Visit: Payer: Self-pay

## 2014-04-22 ENCOUNTER — Encounter (HOSPITAL_COMMUNITY): Payer: Self-pay | Admitting: Emergency Medicine

## 2014-04-22 DIAGNOSIS — Z87891 Personal history of nicotine dependence: Secondary | ICD-10-CM | POA: Insufficient documentation

## 2014-04-22 DIAGNOSIS — F329 Major depressive disorder, single episode, unspecified: Secondary | ICD-10-CM

## 2014-04-22 DIAGNOSIS — I480 Paroxysmal atrial fibrillation: Secondary | ICD-10-CM | POA: Diagnosis present

## 2014-04-22 DIAGNOSIS — Z79899 Other long term (current) drug therapy: Secondary | ICD-10-CM | POA: Insufficient documentation

## 2014-04-22 DIAGNOSIS — I4891 Unspecified atrial fibrillation: Secondary | ICD-10-CM | POA: Insufficient documentation

## 2014-04-22 DIAGNOSIS — G43909 Migraine, unspecified, not intractable, without status migrainosus: Secondary | ICD-10-CM | POA: Insufficient documentation

## 2014-04-22 DIAGNOSIS — R079 Chest pain, unspecified: Secondary | ICD-10-CM

## 2014-04-22 DIAGNOSIS — K7689 Other specified diseases of liver: Secondary | ICD-10-CM | POA: Insufficient documentation

## 2014-04-22 DIAGNOSIS — R61 Generalized hyperhidrosis: Secondary | ICD-10-CM | POA: Insufficient documentation

## 2014-04-22 DIAGNOSIS — R110 Nausea: Secondary | ICD-10-CM | POA: Insufficient documentation

## 2014-04-22 DIAGNOSIS — D649 Anemia, unspecified: Secondary | ICD-10-CM

## 2014-04-22 DIAGNOSIS — R42 Dizziness and giddiness: Secondary | ICD-10-CM | POA: Insufficient documentation

## 2014-04-22 DIAGNOSIS — K219 Gastro-esophageal reflux disease without esophagitis: Secondary | ICD-10-CM | POA: Insufficient documentation

## 2014-04-22 DIAGNOSIS — E669 Obesity, unspecified: Secondary | ICD-10-CM | POA: Insufficient documentation

## 2014-04-22 DIAGNOSIS — Z86711 Personal history of pulmonary embolism: Secondary | ICD-10-CM | POA: Insufficient documentation

## 2014-04-22 DIAGNOSIS — Z86718 Personal history of other venous thrombosis and embolism: Secondary | ICD-10-CM | POA: Insufficient documentation

## 2014-04-22 DIAGNOSIS — Z6841 Body Mass Index (BMI) 40.0 and over, adult: Secondary | ICD-10-CM | POA: Diagnosis present

## 2014-04-22 DIAGNOSIS — R0789 Other chest pain: Principal | ICD-10-CM | POA: Insufficient documentation

## 2014-04-22 DIAGNOSIS — Z7982 Long term (current) use of aspirin: Secondary | ICD-10-CM | POA: Insufficient documentation

## 2014-04-22 DIAGNOSIS — R072 Precordial pain: Secondary | ICD-10-CM

## 2014-04-22 DIAGNOSIS — F32A Depression, unspecified: Secondary | ICD-10-CM

## 2014-04-22 HISTORY — DX: Other pulmonary embolism without acute cor pulmonale: I26.99

## 2014-04-22 HISTORY — DX: Depression, unspecified: F32.A

## 2014-04-22 HISTORY — DX: Major depressive disorder, single episode, unspecified: F32.9

## 2014-04-22 HISTORY — DX: Anxiety disorder, unspecified: F41.9

## 2014-04-22 HISTORY — DX: Deep phlebothrombosis in pregnancy, unspecified trimester: O22.30

## 2014-04-22 HISTORY — DX: Personal history of other medical treatment: Z92.89

## 2014-04-22 HISTORY — DX: Tinnitus, unspecified ear: H93.19

## 2014-04-22 LAB — HEPATIC FUNCTION PANEL
ALBUMIN: 4.5 g/dL (ref 3.5–5.2)
ALK PHOS: 105 U/L (ref 39–117)
ALT: 30 U/L (ref 0–35)
AST: 23 U/L (ref 0–37)
Bilirubin, Direct: <0.2 mg/dL (ref 0.0–0.3)
Total Bilirubin: 0.3 mg/dL (ref 0.3–1.2)
Total Protein: 8 g/dL (ref 6.0–8.3)

## 2014-04-22 LAB — CBC
HEMATOCRIT: 36.8 % (ref 36.0–46.0)
HEMOGLOBIN: 12.2 g/dL (ref 12.0–15.0)
MCH: 28.6 pg (ref 26.0–34.0)
MCHC: 33.2 g/dL (ref 30.0–36.0)
MCV: 86.4 fL (ref 78.0–100.0)
Platelets: 306 10*3/uL (ref 150–400)
RBC: 4.26 MIL/uL (ref 3.87–5.11)
RDW: 13.2 % (ref 11.5–15.5)
WBC: 6.8 10*3/uL (ref 4.0–10.5)

## 2014-04-22 LAB — TROPONIN I

## 2014-04-22 LAB — BASIC METABOLIC PANEL
BUN: 11 mg/dL (ref 6–23)
CHLORIDE: 101 meq/L (ref 96–112)
CO2: 22 mEq/L (ref 19–32)
Calcium: 9.9 mg/dL (ref 8.4–10.5)
Creatinine, Ser: 0.65 mg/dL (ref 0.50–1.10)
Glucose, Bld: 82 mg/dL (ref 70–99)
POTASSIUM: 4.5 meq/L (ref 3.7–5.3)
SODIUM: 140 meq/L (ref 137–147)

## 2014-04-22 LAB — HEMOGLOBIN A1C
Hgb A1c MFr Bld: 5.3 % (ref <5.7)
Mean Plasma Glucose: 105 mg/dL (ref <117)

## 2014-04-22 LAB — TSH: TSH: 1.26 u[IU]/mL (ref 0.350–4.500)

## 2014-04-22 LAB — D-DIMER, QUANTITATIVE: D-Dimer, Quant: 0.73 ug/mL-FEU — ABNORMAL HIGH (ref 0.00–0.48)

## 2014-04-22 LAB — I-STAT TROPONIN, ED: Troponin i, poc: 0.01 ng/mL (ref 0.00–0.08)

## 2014-04-22 MED ORDER — NITROGLYCERIN 2 % TD OINT
1.0000 [in_us] | TOPICAL_OINTMENT | Freq: Four times a day (QID) | TRANSDERMAL | Status: DC
  Filled 2014-04-22: qty 30

## 2014-04-22 MED ORDER — NITROGLYCERIN 0.4 MG SL SUBL
0.4000 mg | SUBLINGUAL_TABLET | SUBLINGUAL | Status: DC | PRN
Start: 2014-04-22 — End: 2014-04-22

## 2014-04-22 MED ORDER — VITAMIN D 1000 UNITS PO TABS
2000.0000 [IU] | ORAL_TABLET | Freq: Every day | ORAL | Status: DC
  Administered 2014-04-23 – 2014-04-24 (×2): 2000 [IU] via ORAL
  Filled 2014-04-22 (×3): qty 2

## 2014-04-22 MED ORDER — ADULT MULTIVITAMIN W/MINERALS CH
1.0000 | ORAL_TABLET | Freq: Every day | ORAL | Status: DC
  Administered 2014-04-23 – 2014-04-24 (×2): 1 via ORAL
  Filled 2014-04-22 (×4): qty 1

## 2014-04-22 MED ORDER — PANTOPRAZOLE SODIUM 40 MG PO TBEC
40.0000 mg | DELAYED_RELEASE_TABLET | Freq: Every day | ORAL | Status: DC
Start: 2014-04-23 — End: 2014-04-24
  Administered 2014-04-23 – 2014-04-24 (×2): 40 mg via ORAL
  Filled 2014-04-22 (×2): qty 1

## 2014-04-22 MED ORDER — METOPROLOL TARTRATE 25 MG PO TABS
25.0000 mg | ORAL_TABLET | Freq: Two times a day (BID) | ORAL | Status: DC
  Administered 2014-04-22 – 2014-04-24 (×4): 25 mg via ORAL
  Filled 2014-04-22 (×5): qty 1

## 2014-04-22 MED ORDER — BUPROPION HCL ER (XL) 150 MG PO TB24
150.0000 mg | ORAL_TABLET | Freq: Every day | ORAL | Status: DC
  Administered 2014-04-23 – 2014-04-24 (×2): 150 mg via ORAL
  Filled 2014-04-22 (×2): qty 1

## 2014-04-22 MED ORDER — ONDANSETRON HCL 4 MG/2ML IJ SOLN: 4.0000 mg | Freq: Four times a day (QID) | INTRAMUSCULAR | Status: DC | PRN

## 2014-04-22 MED ORDER — ENOXAPARIN SODIUM 40 MG/0.4ML ~~LOC~~ SOLN
40.0000 mg | SUBCUTANEOUS | Status: DC
Start: 2014-04-22 — End: 2014-04-24
  Filled 2014-04-22 (×3): qty 0.4

## 2014-04-22 MED ORDER — ASPIRIN 81 MG PO CHEW: 324.0000 mg | CHEWABLE_TABLET | ORAL | Status: AC

## 2014-04-22 MED ORDER — FLECAINIDE ACETATE 50 MG PO TABS
50.0000 mg | ORAL_TABLET | Freq: Once | ORAL | Status: AC
  Administered 2014-04-22: 50 mg via ORAL
  Filled 2014-04-22: qty 1

## 2014-04-22 MED ORDER — ASPIRIN EC 81 MG PO TBEC
81.0000 mg | DELAYED_RELEASE_TABLET | Freq: Every day | ORAL | Status: DC
  Administered 2014-04-23 – 2014-04-24 (×2): 81 mg via ORAL
  Filled 2014-04-22 (×2): qty 1

## 2014-04-22 MED ORDER — NITROGLYCERIN 2 % TD OINT
1.0000 [in_us] | TOPICAL_OINTMENT | Freq: Once | TRANSDERMAL | Status: AC
  Administered 2014-04-22: 1 [in_us] via TOPICAL
  Filled 2014-04-22: qty 1

## 2014-04-22 MED ORDER — GI COCKTAIL ~~LOC~~
30.0000 mL | Freq: Three times a day (TID) | ORAL | Status: DC | PRN
  Administered 2014-04-22: 30 mL via ORAL
  Filled 2014-04-22: qty 30

## 2014-04-22 MED ORDER — MORPHINE SULFATE 2 MG/ML IJ SOLN
1.0000 mg | INTRAMUSCULAR | Status: DC | PRN
Start: 2014-04-22 — End: 2014-04-24
  Administered 2014-04-22 – 2014-04-23 (×2): 1 mg via INTRAVENOUS
  Filled 2014-04-22 (×2): qty 1

## 2014-04-22 MED ORDER — BUTALBITAL-APAP-CAFFEINE 50-325-40 MG PO TABS: 1.0000 | ORAL_TABLET | Freq: Two times a day (BID) | ORAL | Status: DC | PRN

## 2014-04-22 MED ORDER — FLECAINIDE ACETATE 50 MG PO TABS
50.0000 mg | ORAL_TABLET | Freq: Two times a day (BID) | ORAL | Status: DC
  Administered 2014-04-23 – 2014-04-24 (×3): 50 mg via ORAL
  Filled 2014-04-22 (×6): qty 1

## 2014-04-22 MED ORDER — ASPIRIN 300 MG RE SUPP: 300.0000 mg | RECTAL | Status: AC

## 2014-04-22 MED ORDER — ATORVASTATIN CALCIUM 80 MG PO TABS
80.0000 mg | ORAL_TABLET | Freq: Every day | ORAL | Status: DC
  Administered 2014-04-23: 80 mg via ORAL
  Filled 2014-04-22 (×2): qty 1

## 2014-04-22 NOTE — H&P (Signed)
[facility] History and Physical  Regina A Swazil[place]Jordan UJW:[id number] DOB: 07/31/1961 DOA: 04/22/2014  Referring physician:  Trixie DredgeEmily West PCP:  Lupe CarneyMITCHELL,DEAN, MD   Chief Complaint:  CP  HPI:  The patient is a 53 y.o. year-old female with history of chest pain with negative Myoview in 2009, paroxysmal atrial fibrillation, family history of arrhythmias, DVT and pulmonary embolism during pregnancy,previous history of smoking, GERD, obesity, migraine headaches who presents with chest pain.  The patient was last at their baseline health this morning when she went to work.   around noon, she developed some left chest discomfort, rated 3/10. She did not have associated shortness of breath at that time. After about 20 minutes, the pain became worse, 8/10 and radiated to her left shoulder and slightly around her left lower chest to the left upper quadrant.    she denies associated shortness of breath, but had lightheadedness and presyncope. She became clammy and sweaty and felt nauseated. She called for help because she thought she was going to pass out. She denies associated palpitations. She states the sensation she experienced today was very different from her previous pulmonary embolism and from her paroxysmal atrial fibrillation.  Her colleague called EMS, and by the time EMS arrived, her pain was a 5/10. She was given nitroglycerin, which improved her pain to a 2/10. In the emergency department, her pain was stable at 2/10 for several hours, but over the last 20 minutes, her pain is increased.  Nitroglycerin paste was applied which improved her pain back to a 2/10.  In the emergency department, her vital signs were stable.  CBC and BMP were within normal limits. Her troponin was negative, and her EKG was stable from prior with no evidence of acute ischemia. She is currently in sinus rhythm. She is being admitted for chest pain evaluation.  Review of Systems:  General:  Denies fevers, chills, weight loss or  gain HEENT:  Denies changes to hearing and vision, rhinorrhea, sinus congestion, sore throat CV:   Per history of present illness.  PULM:  Denies SOB, wheezing, cough.   GI:  Denies vomiting, constipation, diarrhea.   GU:  Denies dysuria, frequency, urgency ENDO:  Denies polyuria, polydipsia.   HEME:  Denies hematemesis, blood in stools, melena, abnormal bruising or bleeding.  LYMPH:  Denies lymphadenopathy.   MSK:  Denies arthralgias, myalgias.   DERM:  Denies skin rash or ulcer.   NEURO:  Denies focal numbness, weakness, slurred speech, confusion, facial droop.  PSYCH:  Denies anxiety and depression.    Past Medical History  Diagnosis Date  . Chest pain     a. 2009: neg MV  ([facility])  . GERD (gastroesophageal reflux disease)   . Obesity   . Migraines   . PAF (paroxysmal atrial fibrillation)     a. Dx 07/2013;  b. CHA2DS2VASc=1 (female);  c. 07/2013 Echo:  EF 50-55%, no rwma, mod LVH.  Marland Kitchen. Hepatic steatosis   . Atrial fibrillation    Past Surgical History  Procedure Laterality Date  . Appendectomy      early 1990's.  . Right ear surgery      early 2000's.   Social History:  reports that she quit smoking about 23 years ago. She does not have any smokeless tobacco history on file. She reports that she does not drink alcohol or use illicit drugs.   Allergies  Allergen Reactions  . Codeine Nausea And Vomiting    Family History  Problem Relation Age of Onset  .  Lung cancer Father     died @ 71, ETOH abuse  . Kidney failure Mother     s/p renal Tx  . Congestive Heart Failure Mother     died @ 82  . Atrial fibrillation Brother     alive @ 87  . Other Brother     died in MVA  . Other Daughter     cardiac arrest  . Cardiomyopathy Daughter   . Other Other     WPW in granddaughter     Prior to Admission medications   Medication Sig Start Date End Date Taking? Authorizing Provider  aspirin EC 81 MG EC tablet Take 1 tablet (81 mg total) by mouth daily. 07/15/13  Yes  Ok Anis, NP  buPROPion (WELLBUTRIN XL) 150 MG 24 hr tablet Take 150 mg by mouth daily.   Yes Historical Provider, MD  butalbital-acetaminophen-caffeine (FIORICET, ESGIC) 50-325-40 MG per tablet Take 1 tablet by mouth 2 (two) times daily as needed for headache.   Yes Historical Provider, MD  Cholecalciferol (VITAMIN D) 2000 UNITS tablet Take 2,000 Units by mouth daily.   Yes Historical Provider, MD  flecainide (TAMBOCOR) 50 MG tablet Take 1 tablet (50 mg total) by mouth 2 (two) times daily. 02/14/14  Yes Wendall Stade, MD  metoprolol tartrate (LOPRESSOR) 25 MG tablet Take 1 tablet (25 mg total) by mouth 2 (two) times daily. 02/14/14  Yes Wendall Stade, MD  Multiple Vitamins-Minerals (MULTIVITAMIN WITH MINERALS) tablet Take 1 tablet by mouth daily.   Yes Historical Provider, MD  omeprazole (PRILOSEC) 20 MG capsule Take 20 mg by mouth 2 (two) times daily before a meal.    Yes Historical Provider, MD   Physical Exam: Filed Vitals:   04/22/14 1401 04/22/14 1700  BP: 128/77 127/61  Pulse:  64  Temp: 98.3 F (36.8 C)   Resp: 23 19  SpO2: 100% 100%     General:   Caucasian female, no acute distress   Eyes:  PERRL, anicteric, non-injected.  ENT:  Nares clear.  OP clear, non-erythematous without plaques or exudates.  MMM.  Neck:  Supple without TM or JVD.    Lymph:  No cervical, supraclavicular, or submandibular LAD.  Cardiovascular:  RRR, normal S1, S2, without m/r/g.  2+ pulses, warm extremities  Respiratory:  CTA bilaterally without increased WOB.  Abdomen:  NABS.  Soft, ND/NT.    Skin:  No rashes or focal lesions.  Musculoskeletal:  Normal bulk and tone.  No LE edema.  Psychiatric:  A & O x 4.  Appropriate affect.  Neurologic:  CN 3-12 intact.  5/5 strength.  Sensation intact.  Labs on Admission:  Basic Metabolic Panel:  Recent Labs Lab 04/22/14 1400  NA 140  K 4.5  CL 101  CO2 22  GLUCOSE 82  BUN 11  CREATININE 0.65  CALCIUM 9.9   Liver Function  Tests:  Recent Labs Lab 04/22/14 1545  AST 23  ALT 30  ALKPHOS 105  BILITOT 0.3  PROT 8.0  ALBUMIN 4.5   No results found for this basename: LIPASE, AMYLASE,  in the last 168 hours No results found for this basename: AMMONIA,  in the last 168 hours CBC:  Recent Labs Lab 04/22/14 1400  WBC 6.8  HGB 12.2  HCT 36.8  MCV 86.4  PLT 306   Cardiac Enzymes: No results found for this basename: CKTOTAL, CKMB, CKMBINDEX, TROPONINI,  in the last 168 hours  BNP (last 3 results) No results found for  this basename: PROBNP,  in the last 8760 hours CBG: No results found for this basename: GLUCAP,  in the last 168 hours  Radiological Exams on Admission: Dg Chest 2 View  04/22/2014   CLINICAL DATA:  Left chest pain radiating into left arm  EXAM: CHEST  2 VIEW  COMPARISON:  Prior chest x-ray 07/14/2013 ; chest CT 06/22/2009  FINDINGS: The lungs are clear and negative for focal airspace consolidation, pulmonary edema or suspicious pulmonary nodule. Small focus of chronic pleural parenchymal scarring in the periphery of the left lower lobe dating back to at least August of 2010. Irregular patchy opacity in the lateral aspect of the right upper lobe overlying the right sixth rib is also chronic and unchanged dating back to at least 2010. This likely represents a second focus of pleural parenchymal scarring. No pleural effusion or pneumothorax. Cardiac and mediastinal contours are within normal limits. No acute fracture or lytic or blastic osseous lesions. The visualized upper abdominal bowel gas pattern is unremarkable.  IMPRESSION: No active cardiopulmonary disease.  Stable foci of scarring in the periphery of the right upper and left lower lobes.   Electronically Signed   By: Malachy MoanHeath  McCullough M.D.   On: 04/22/2014 16:09    EKG: Independently reviewed. NSR, borderline 1st degree AV block, no acute ischemia, stable from previous ECG  Assessment/Plan Active Problems:   Chest pain  ---  Chest  pain,  somewhat atypical given she did not have associated shortness of breath, and her pain has persisted. Most of her associated symptoms seem to be related to presyncope.    -  Telemetry -  Cycle troponins -  D-dimer  -  Continue ASA and BB -  Start statin  -  NTG paste -  lipid panel and hemoglobin A1c -  Please call cardiology in AM  PAF (lone a-fib), currently in sinus rhythm,  -  Continue ASA  -  Continue BB and flecainide  Depression/anxiety, stable, continue wellbutrin  GERD, stable, continue PPI  Migraine, stable, continue fioricet prn  Obesity, defer to PCP for counseling on weight loss  Diet:  Healthy heart, NPO at MN for possible stress Access:  PIV IVF:  off Proph:  lovenox  Code Status: full Family Communication: patient alone  Disposition Plan: Admit to telemetry  Time spent: 60 min Renae FickleSHORT, Latara Micheli [facility] Pager [phone]  If 7PM-7AM, please contact night-coverage [email or web address] Password [email or web address] 04/22/2014, 5:54 PM

## 2014-04-22 NOTE — ED Notes (Signed)
MD at bedside. 

## 2014-04-22 NOTE — ED Notes (Signed)
Patient transported to X-ray 

## 2014-04-22 NOTE — ED Provider Notes (Signed)
CSN: [id number]     Arrival date & time 04/22/14  1350 History   First MD Initiated Contact with Patient 04/22/14 1503     Chief Complaint  Patient presents with  . Chest Pain     (Consider location/radiation/quality/duration/timing/severity/associated sxs/prior Treatment) The history is provided by the patient and medical records.    Patient with hx afib, PE, DVT presents with left anterior chest pain, lightheadedness, nausea that began abuptly while she was on the phone at work, around noon today.  Pain was 8/10 intensity, described as pressure, no radiation, no exacerbating factors.  She then developed tingling down her back and legs.  Felt like she was going to pass out.  She additionally felt a "lump in (her) throat" that is what she feels whenever she is in Afib.  The pain was improved with nitroglycerin given by EMS.  Pain is currently 2/10 intensity, "just enough to be irritating."  Denies fever, SOB, recent illness, leg swelling. She believes she had an exercise stress test in September of 2104 when she was diagnosed with Afib.  Patient has family hx cardiac problems: mother with CHF, father with unknown problems, brother with afib, daughter with cardiac arrest.  Patient has a personal hx of both DVT and PE remotely.  DVT was in the setting of pregnancy.  PE was "sometime after that."  Not currently on blood thinners.  No recent immobilization, no exogenous estrogen, no unilateral leg swelling.  Cardiologist Dr Eden Emms PCP Dr Lupe Carney.    Past Medical History  Diagnosis Date  . Chest pain     a. 2009: neg MV  ([facility])  . GERD (gastroesophageal reflux disease)   . Obesity   . Migraines   . PAF (paroxysmal atrial fibrillation)     a. Dx 07/2013;  b. CHA2DS2VASc=1 (female);  c. 07/2013 Echo:  EF 50-55%, no rwma, mod LVH.  Marland Kitchen Hepatic steatosis   . Atrial fibrillation    Past Surgical History  Procedure Laterality Date  . Appendectomy      early 1990's.  . Right ear surgery       early 2000's.   Family History  Problem Relation Age of Onset  . Lung cancer Father     died @ 36, ETOH abuse  . Kidney failure Mother     s/p renal Tx  . Congestive Heart Failure Mother     died @ 73  . Atrial fibrillation Brother     alive @ 41  . Other Brother     died in MVA   History  Substance Use Topics  . Smoking status: Former Smoker -- 1.00 packs/day for 8 years    Quit date: 11/04/1990  . Smokeless tobacco: Not on file  . Alcohol Use: No   OB History   Grav Para Term Preterm Abortions TAB SAB Ect Mult Living                 Review of Systems  All other systems reviewed and are negative.     Allergies  Codeine  Home Medications   Prior to Admission medications   Medication Sig Start Date End Date Taking? Authorizing Provider  aspirin EC 81 MG EC tablet Take 1 tablet (81 mg total) by mouth daily. 07/15/13  Yes Ok Anis, NP  buPROPion (WELLBUTRIN XL) 150 MG 24 hr tablet Take 150 mg by mouth daily.   Yes Historical Provider, MD  butalbital-acetaminophen-caffeine (FIORICET, ESGIC) 50-325-40 MG per tablet Take 1 tablet by  mouth 2 (two) times daily as needed for headache.   Yes Historical Provider, MD  Cholecalciferol (VITAMIN D) 2000 UNITS tablet Take 2,000 Units by mouth daily.   Yes Historical Provider, MD  flecainide (TAMBOCOR) 50 MG tablet Take 1 tablet (50 mg total) by mouth 2 (two) times daily. 02/14/14  Yes Wendall StadePeter C Nishan, MD  metoprolol tartrate (LOPRESSOR) 25 MG tablet Take 1 tablet (25 mg total) by mouth 2 (two) times daily. 02/14/14  Yes Wendall StadePeter C Nishan, MD  Multiple Vitamins-Minerals (MULTIVITAMIN WITH MINERALS) tablet Take 1 tablet by mouth daily.   Yes Historical Provider, MD  omeprazole (PRILOSEC) 20 MG capsule Take 20 mg by mouth 2 (two) times daily before a meal.    Yes Historical Provider, MD   BP 128/77  Temp(Src) 98.3 F (36.8 C)  Resp 23  SpO2 100% Physical Exam  Nursing note and vitals reviewed. Constitutional: She appears  well-developed and well-nourished. No distress.  HENT:  Head: Normocephalic and atraumatic.  Neck: Neck supple.  Cardiovascular: Normal rate, regular rhythm and intact distal pulses.   Pulmonary/Chest: Effort normal and breath sounds normal. No respiratory distress. She has no wheezes. She has no rales.  Abdominal: Soft. She exhibits no distension. There is no tenderness. There is no rebound and no guarding.  Musculoskeletal: She exhibits no edema.  Neurological: She is alert.  Skin: She is not diaphoretic.    ED Course  Procedures (including critical care time) Labs Review Labs Reviewed  BASIC METABOLIC PANEL  CBC  HEPATIC FUNCTION PANEL  TROPONIN I  TROPONIN I  TROPONIN I  TSH  HEMOGLOBIN A1C  LIPID PANEL  D-DIMER, QUANTITATIVE  CBC  BASIC METABOLIC PANEL  I-STAT TROPOININ, ED    Imaging Review Dg Chest 2 View  04/22/2014   CLINICAL DATA:  Left chest pain radiating into left arm  EXAM: CHEST  2 VIEW  COMPARISON:  Prior chest x-ray 07/14/2013 ; chest CT 06/22/2009  FINDINGS: The lungs are clear and negative for focal airspace consolidation, pulmonary edema or suspicious pulmonary nodule. Small focus of chronic pleural parenchymal scarring in the periphery of the left lower lobe dating back to at least August of 2010. Irregular patchy opacity in the lateral aspect of the right upper lobe overlying the right sixth rib is also chronic and unchanged dating back to at least 2010. This likely represents a second focus of pleural parenchymal scarring. No pleural effusion or pneumothorax. Cardiac and mediastinal contours are within normal limits. No acute fracture or lytic or blastic osseous lesions. The visualized upper abdominal bowel gas pattern is unremarkable.  IMPRESSION: No active cardiopulmonary disease.  Stable foci of scarring in the periphery of the right upper and left lower lobes.   Electronically Signed   By: Malachy MoanHeath  McCullough M.D.   On: 04/22/2014 16:09     EKG  Interpretation None       Date: 04/22/2014  Rate: 68  Rhythm: normal sinus rhythm  QRS Axis: left  Intervals: PR prolonged  ST/T Wave abnormalities: nonspecific T wave changes  Conduction Disutrbances:none  Narrative Interpretation:   Old EKG Reviewed: unchanged   Filed Vitals:   04/22/14 1401  BP: 128/77  Temp: 98.3 F (36.8 C)  Resp: 23    3:27 PM Pt seen and examined.  She declines any treatment of current pain.   4:58 PM Discussed pt with Dr Clarene DukeMcManus.  Discussed results with patient, plan is for admission.  Pt agrees with plan.    MDM   Final  diagnoses:  Chest pain at rest    Patient with hx Afib, PE p/w left anterior chest pressure with associated lightheadedness, nausea, diaphoresis while at work.  Pain improved with nitroglycerin.  EKG, first troponin unremarkable. Admitted to [facility] for chest pain/ACS rule out.    Well's PE score is 1.5, low risk group.  Despite patient's hx PE and DVT, I doubt PE in this case.  Pt's only risk factor is prior PE and she has no SOB, O2 is 100%.  Pt unable to say whether this pain is similar as PE was 30 years ago, I suspect in setting of pregnancy/recent pregnancy.    Trixie Dredgemily Denese Mentink, PA-C 04/22/14 1845

## 2014-04-22 NOTE — ED Notes (Signed)
Attempted report 

## 2014-04-22 NOTE — ED Notes (Signed)
Pt from work, c/o chest tightness with weakness starting at noon. Pt states she felt like passing out. Pain 3/10 at this time. Pt states hx of afib.

## 2014-04-23 ENCOUNTER — Observation Stay (HOSPITAL_COMMUNITY): Payer: BC Managed Care – PPO

## 2014-04-23 ENCOUNTER — Encounter (HOSPITAL_COMMUNITY): Payer: Self-pay | Admitting: Cardiology

## 2014-04-23 DIAGNOSIS — R079 Chest pain, unspecified: Secondary | ICD-10-CM

## 2014-04-23 DIAGNOSIS — E669 Obesity, unspecified: Secondary | ICD-10-CM

## 2014-04-23 LAB — CBC
HEMATOCRIT: 33.1 % — AB (ref 36.0–46.0)
HEMOGLOBIN: 10.7 g/dL — AB (ref 12.0–15.0)
MCH: 28 pg (ref 26.0–34.0)
MCHC: 32.3 g/dL (ref 30.0–36.0)
MCV: 86.6 fL (ref 78.0–100.0)
Platelets: 315 10*3/uL (ref 150–400)
RBC: 3.82 MIL/uL — AB (ref 3.87–5.11)
RDW: 13.3 % (ref 11.5–15.5)
WBC: 8 10*3/uL (ref 4.0–10.5)

## 2014-04-23 LAB — TROPONIN I

## 2014-04-23 LAB — LIPID PANEL
Cholesterol: 186 mg/dL (ref 0–200)
HDL: 53 mg/dL (ref >39)
LDL CALC: 109 mg/dL — AB (ref 0–99)
Total CHOL/HDL Ratio: 3.5 RATIO
Triglycerides: 122 mg/dL (ref <150)
VLDL: 24 mg/dL (ref 0–40)

## 2014-04-23 LAB — BASIC METABOLIC PANEL
BUN: 12 mg/dL (ref 6–23)
CO2: 25 meq/L (ref 19–32)
CREATININE: 0.79 mg/dL (ref 0.50–1.10)
Calcium: 9.6 mg/dL (ref 8.4–10.5)
Chloride: 100 mEq/L (ref 96–112)
GFR calc Af Amer: >90 mL/min (ref >90)
GFR calc non Af Amer: >90 mL/min (ref >90)
GLUCOSE: 92 mg/dL (ref 70–99)
Potassium: 4.2 mEq/L (ref 3.7–5.3)
Sodium: 141 mEq/L (ref 137–147)

## 2014-04-23 MED ORDER — NITROGLYCERIN 0.4 MG SL SUBL: 0.4000 mg | SUBLINGUAL_TABLET | SUBLINGUAL | Status: DC | PRN

## 2014-04-23 MED ORDER — IOHEXOL 350 MG/ML SOLN
100.0000 mL | Freq: Once | INTRAVENOUS | Status: AC | PRN
  Administered 2014-04-23: 100 mL via INTRAVENOUS

## 2014-04-23 NOTE — Progress Notes (Signed)
[facility] PROGRESS NOTE  Regina Morales WUJ:[id number] DOB: 07/08/1961 DOA: 04/22/2014 PCP: Lupe CarneyMITCHELL,DEAN, MD  Assessment/Plan  Chest pain, somewhat atypical given she did not have associated shortness of breath, and her pain has persisted. Most of her associated symptoms seem to be related to presyncope.  - Telemetry: NSR with PAC - Troponin neg  - CT angio chest neg for PE - Continue ASA and BB  - Continue statin  - d/c NTG paste  - lipid panel: HDL 53, LDL 109 and hemoglobin A1c 5.3 -  Appreciate cardiology assistance  PAF (lone a-fib), currently in sinus rhythm,  - Continue ASA  - Continue BB and flecainide   Depression/anxiety, stable, continue wellbutrin   GERD, stable, continue PPI   Migraine, stable, continue fioricet prn   Obesity, defer to PCP for counseling on weight loss   Diet: Healthy heart Access: PIV  IVF: off  Proph: lovenox   Code Status: full  Family Communication: patient alone  Disposition Plan: Admit to telemetry   Consultants:  Cardiology  Procedures:  CXR  CT angio chest  Antibiotics:  none   HPI/Subjective:  Chest pain continued overnight which kept her awake.  Resolved this morning around 10AM.     Objective: Filed Vitals:   04/22/14 2100 04/23/14 0614 04/23/14 0753 04/23/14 1343  BP: 111/62 115/64 113/64 111/65  Pulse: 68 57 59 63  Temp: 97.9 F (36.6 C) 98.1 F (36.7 C)  98.2 F (36.8 C)  TempSrc:  Oral  Oral  Resp: 16 16  16   Weight:  123.514 kg (272 lb 4.8 oz)    SpO2: 100% 98%  98%   No intake or output data in the 24 hours ending 04/23/14 1709 Filed Weights   04/23/14 0614  Weight: 123.514 kg (272 lb 4.8 oz)    Exam:   General:  CF, No acute distress  HEENT:  NCAT, MMM  Cardiovascular:  RRR, nl S1, S2 no mrg, 2+ pulses, warm extremities  Respiratory:  CTAB, no increased WOB  Abdomen:   NABS, soft, NT/ND  MSK:   Normal tone and bulk, no LEE  Neuro:  Grossly intact  Data  Reviewed: Basic Metabolic Panel:  Recent Labs Lab 04/22/14 1400 04/23/14 0018  NA 140 141  K 4.5 4.2  CL 101 100  CO2 22 25  GLUCOSE 82 92  BUN 11 12  CREATININE 0.65 0.79  CALCIUM 9.9 9.6   Liver Function Tests:  Recent Labs Lab 04/22/14 1545  AST 23  ALT 30  ALKPHOS 105  BILITOT 0.3  PROT 8.0  ALBUMIN 4.5   No results found for this basename: LIPASE, AMYLASE,  in the last 168 hours No results found for this basename: AMMONIA,  in the last 168 hours CBC:  Recent Labs Lab 04/22/14 1400 04/23/14 0018  WBC 6.8 8.0  HGB 12.2 10.7*  HCT 36.8 33.1*  MCV 86.4 86.6  PLT 306 315   Cardiac Enzymes:  Recent Labs Lab 04/22/14 1927 04/23/14 0018 04/23/14 0605  TROPONINI <0.30 <0.30 <0.30   BNP (last 3 results) No results found for this basename: PROBNP,  in the last 8760 hours CBG: No results found for this basename: GLUCAP,  in the last 168 hours  No results found for this or any previous visit (from the past 240 hour(s)).   Studies: Dg Chest 2 View  04/22/2014   CLINICAL DATA:  Left chest pain radiating into left arm  EXAM: CHEST  2 VIEW  COMPARISON:  Prior  chest x-ray 07/14/2013 ; chest CT 06/22/2009  FINDINGS: The lungs are clear and negative for focal airspace consolidation, pulmonary edema or suspicious pulmonary nodule. Small focus of chronic pleural parenchymal scarring in the periphery of the left lower lobe dating back to at least August of 2010. Irregular patchy opacity in the lateral aspect of the right upper lobe overlying the right sixth rib is also chronic and unchanged dating back to at least 2010. This likely represents a second focus of pleural parenchymal scarring. No pleural effusion or pneumothorax. Cardiac and mediastinal contours are within normal limits. No acute fracture or lytic or blastic osseous lesions. The visualized upper abdominal bowel gas pattern is unremarkable.  IMPRESSION: No active cardiopulmonary disease.  Stable foci of scarring  in the periphery of the right upper and left lower lobes.   Electronically Signed   By: Malachy MoanHeath  McCullough M.D.   On: 04/22/2014 16:09   Ct Angio Chest Pe W/cm &/or Wo Cm  04/23/2014   CLINICAL DATA:  Chest pain. Lightheadedness and presyncope. Nausea.  EXAM: CT ANGIOGRAPHY CHEST WITH CONTRAST  TECHNIQUE: Multidetector CT imaging of the chest was performed using the standard protocol during bolus administration of intravenous contrast. Multiplanar CT image reconstructions and MIPs were obtained to evaluate the vascular anatomy.  CONTRAST:  100mL OMNIPAQUE IOHEXOL 350 MG/ML SOLN  COMPARISON:  Chest radiograph performed 04/22/2014, and CT of the chest performed 06/22/2009  FINDINGS: There is no evidence of pulmonary embolus.  Minimal bibasilar atelectasis or scarring is noted. A normal lymph node is noted along the right minor fissure. The spiculated density along the right minor fissure is grossly stable from 2010, measuring approximately 1.1 cm, and is likely benign. There is no evidence of significant focal consolidation, pleural effusion or pneumothorax.  The mediastinum is unremarkable in appearance. No mediastinal lymphadenopathy is seen. No pericardial effusion is identified. The great vessels are grossly unremarkable in appearance. Incidental note is made of a direct origin of the left vertebral artery from the aortic arch. No axillary lymphadenopathy is seen. The visualized portions of the thyroid gland are unremarkable in appearance.  The visualized portions of the liver and spleen are unremarkable.  No acute osseous abnormalities are seen.  Review of the MIP images confirms the above findings.  IMPRESSION: 1. No evidence of pulmonary embolus. 2. Minimal bibasilar atelectasis or scarring noted. Stable appearance to spiculated density along the right minor fissure; this is likely benign.   Electronically Signed   By: Roanna RaiderJeffery  Chang M.D.   On: 04/23/2014 02:05    Scheduled Meds: . aspirin  324 mg Oral  NOW   Or  . aspirin  300 mg Rectal NOW  . aspirin EC  81 mg Oral Daily  . atorvastatin  80 mg Oral q1800  . buPROPion  150 mg Oral Daily  . cholecalciferol  2,000 Units Oral Daily  . enoxaparin (LOVENOX) injection  40 mg Subcutaneous Q24H  . flecainide  50 mg Oral BID  . metoprolol tartrate  25 mg Oral BID  . multivitamin with minerals  1 tablet Oral Daily  . nitroGLYCERIN  1 inch Topical 4 times per day  . pantoprazole  40 mg Oral Daily   Continuous Infusions:   Principal Problem:   Chest pain Active Problems:   PAF (paroxysmal atrial fibrillation)   GERD (gastroesophageal reflux disease)   Obesity   Depression   Migraine    Time spent: 30 min    SHORT, [facility] AcadianaMACKENZIE  [facility] Pager [phone]. If  7PM-7AM, please contact night-coverage at [email or web address], password [facility] 04/23/2014, 5:09 PM  LOS: 1 day

## 2014-04-23 NOTE — Consult Note (Signed)
Reason for Consult:  Chest pain  Referring Physician: Dr. Sheran Fava PCP: Donnie Coffin, MD Primary Cardiologist: Dr. P. Nishen  Chea A Martinique is an 53 y.o. female.    Chief Complaint:  Chest pain, pt admitted 04/22/14  HPI: 53 y.o. year-old female with history of chest pain with negative Myoview in 20014 paroxysmal atrial fibrillation, family history of arrhythmias, DVT and pulmonary embolism during pregnancy,previous history of smoking, GERD, obesity, migraine headaches who presents with chest pain. The patient was last at their baseline health this morning when she went to work. around noon, she developed some left chest discomfort, rated 3/10. She did not have associated shortness of breath at that time. After about 20 minutes, the pain became worse, 8/10 and radiated to her left shoulder and slightly around her left lower chest to the left upper quadrant. she denies associated shortness of breath, but had lightheadedness and presyncope. She became clammy and sweaty and felt nauseated. She called for help because she thought she was going to pass out. She denies associated palpitations. She states the sensation she experienced today was very different from her previous pulmonary embolism and from her paroxysmal atrial fibrillation. Her colleague called EMS, and by the time EMS arrived, her pain was a 5/10. She was given nitroglycerin, which improved her pain to a 2/10. In the emergency department, her pain was stable at 2/10 for several hours.  Nitroglycerin paste was applied which improved her pain back to a 2/10.  She is currently pain free.  Has felt perfectly fine other then this isolated episode with no exertional symptoms.  CT scan done for elevated d-dmer and negative for PE She does have a history of GERD      Past Medical History  Diagnosis Date  . Chest pain     a. 2009: neg MV  ([facility])  . GERD (gastroesophageal reflux disease)   . Obesity   . PAF (paroxysmal atrial  fibrillation)     a. Dx 07/2013;  b. CHA2DS2VASc=1 (female);  c. 07/2013 Echo:  EF 50-55%, no rwma, mod LVH.  Marland Kitchen Hepatic steatosis   . Atrial fibrillation   . DVT (deep vein thrombosis) in pregnancy 1980's  . Pulmonary embolism ~ 1984    "after I had my last child"  . History of blood transfusion     "low count after appy"   . Migraines     "maybe once/2 months" (04/22/2014)  . Anxiety   . Depression   . Buzzing in ear     "right; even after OR"    Past Surgical History  Procedure Laterality Date  . Inner ear surgery Right 2000's  . Appendectomy  1990's  . Tubal ligation  1985    Family History  Problem Relation Age of Onset  . Lung cancer Father     died @ 69, ETOH abuse  . Kidney failure Mother     s/p renal Tx  . Congestive Heart Failure Mother     died @ 48  . Atrial fibrillation Brother     alive @ 35  . Other Brother     died in [place]  . Other Daughter     cardiac arrest  . Cardiomyopathy Daughter   . Other Other     WPW in granddaughter   Social History:  reports that she quit smoking about 23 years ago. Her smoking use included Cigarettes. She has a 8 pack-year smoking history. She has never used  smokeless tobacco. She reports that she does not drink alcohol or use illicit drugs.  Allergies:  Allergies  Allergen Reactions  . Codeine Nausea And Vomiting    Medications Prior to Admission  Medication Sig Dispense Refill  . aspirin EC 81 MG EC tablet Take 1 tablet (81 mg total) by mouth daily.      Marland Kitchen buPROPion (WELLBUTRIN XL) 150 MG 24 hr tablet Take 150 mg by mouth daily.      . butalbital-acetaminophen-caffeine (FIORICET, ESGIC) 50-325-40 MG per tablet Take 1 tablet by mouth 2 (two) times daily as needed for headache.      . Cholecalciferol (VITAMIN D) 2000 UNITS tablet Take 2,000 Units by mouth daily.      . flecainide (TAMBOCOR) 50 MG tablet Take 1 tablet (50 mg total) by mouth 2 (two) times daily.  180 tablet  3  . metoprolol tartrate (LOPRESSOR) 25 MG  tablet Take 1 tablet (25 mg total) by mouth 2 (two) times daily.  180 tablet  3  . Multiple Vitamins-Minerals (MULTIVITAMIN WITH MINERALS) tablet Take 1 tablet by mouth daily.      Marland Kitchen omeprazole (PRILOSEC) 20 MG capsule Take 20 mg by mouth 2 (two) times daily before a meal.         Results for orders placed during the hospital encounter of 04/22/14 (from the past 48 hour(s))  BASIC METABOLIC PANEL     Status: None   Collection Time    04/22/14  2:00 PM      Result Value Ref Range   Sodium 140  137 - 147 mEq/L   Potassium 4.5  3.7 - 5.3 mEq/L   Chloride 101  96 - 112 mEq/L   CO2 22  19 - 32 mEq/L   Glucose, Bld 82  70 - 99 mg/dL   BUN 11  6 - 23 mg/dL   Creatinine, Ser 0.65  0.50 - 1.10 mg/dL   Calcium 9.9  8.4 - 10.5 mg/dL   GFR calc non Af Amer >90  >90 mL/min   GFR calc Af Amer >90  >90 mL/min   Comment: (NOTE)     The eGFR has been calculated using the CKD EPI equation.     This calculation has not been validated in all clinical situations.     eGFR's persistently <90 mL/min signify possible Chronic Kidney     Disease.  CBC     Status: None   Collection Time    04/22/14  2:00 PM      Result Value Ref Range   WBC 6.8  4.0 - 10.5 K/uL   RBC 4.26  3.87 - 5.11 MIL/uL   Hemoglobin 12.2  12.0 - 15.0 g/dL   HCT 36.8  36.0 - 46.0 %   MCV 86.4  78.0 - 100.0 fL   MCH 28.6  26.0 - 34.0 pg   MCHC 33.2  30.0 - 36.0 g/dL   RDW 13.2  11.5 - 15.5 %   Platelets 306  150 - 400 K/uL  I-STAT TROPOININ, ED     Status: None   Collection Time    04/22/14  2:13 PM      Result Value Ref Range   Troponin i, poc 0.01  0.00 - 0.08 ng/mL   Comment 3            Comment: Due to the release kinetics of cTnI,     a negative result within the first hours     of the onset of  symptoms does not rule out     myocardial infarction with certainty.     If myocardial infarction is still suspected,     repeat the test at appropriate intervals.  HEPATIC FUNCTION PANEL     Status: None   Collection Time     04/22/14  3:45 PM      Result Value Ref Range   Total Protein 8.0  6.0 - 8.3 g/dL   Albumin 4.5  3.5 - 5.2 g/dL   AST 23  0 - 37 U/L   ALT 30  0 - 35 U/L   Alkaline Phosphatase 105  39 - 117 U/L   Total Bilirubin 0.3  0.3 - 1.2 mg/dL   Bilirubin, Direct <0.2  0.0 - 0.3 mg/dL   Indirect Bilirubin NOT CALCULATED  0.3 - 0.9 mg/dL  TROPONIN I     Status: None   Collection Time    04/22/14  7:27 PM      Result Value Ref Range   Troponin I <0.30  <0.30 ng/mL   Comment:            Due to the release kinetics of cTnI,     a negative result within the first hours     of the onset of symptoms does not rule out     myocardial infarction with certainty.     If myocardial infarction is still suspected,     repeat the test at appropriate intervals.  TSH     Status: None   Collection Time    04/22/14  7:27 PM      Result Value Ref Range   TSH 1.260  0.350 - 4.500 uIU/mL  HEMOGLOBIN A1C     Status: None   Collection Time    04/22/14  7:27 PM      Result Value Ref Range   Hemoglobin A1C 5.3  <5.7 %   Comment: (NOTE)                                                                               According to the ADA Clinical Practice Recommendations for 2011, when     HbA1c is used as a screening test:      >=6.5%   Diagnostic of Diabetes Mellitus               (if abnormal result is confirmed)     5.7-6.4%   Increased risk of developing Diabetes Mellitus     References:Diagnosis and Classification of Diabetes Mellitus,Diabetes     WERX,5400,86(PYPPJ 1):S62-S69 and Standards of Medical Care in             Diabetes - 2011,Diabetes Care,2011,34 (Suppl 1):S11-S61.   Mean Plasma Glucose 105  <117 mg/dL   Comment: Performed at [facility]  D-DIMER, QUANTITATIVE     Status: Abnormal   Collection Time    04/22/14  7:27 PM      Result Value Ref Range   D-Dimer, Quant 0.73 (*) 0.00 - 0.48 ug/mL-FEU   Comment:            AT THE INHOUSE ESTABLISHED CUTOFF     VALUE OF 0.48 ug/mL FEU,  THIS ASSAY HAS BEEN DOCUMENTED     IN THE LITERATURE TO HAVE     A SENSITIVITY AND NEGATIVE     PREDICTIVE VALUE OF AT LEAST     98 TO 99%.  THE TEST RESULT     SHOULD BE CORRELATED WITH     AN ASSESSMENT OF THE CLINICAL     PROBABILITY OF DVT / VTE.  TROPONIN I     Status: None   Collection Time    04/23/14 12:18 AM      Result Value Ref Range   Troponin I <0.30  <0.30 ng/mL   Comment:            Due to the release kinetics of cTnI,     a negative result within the first hours     of the onset of symptoms does not rule out     myocardial infarction with certainty.     If myocardial infarction is still suspected,     repeat the test at appropriate intervals.  CBC     Status: Abnormal   Collection Time    04/23/14 12:18 AM      Result Value Ref Range   WBC 8.0  4.0 - 10.5 K/uL   RBC 3.82 (*) 3.87 - 5.11 MIL/uL   Hemoglobin 10.7 (*) 12.0 - 15.0 g/dL   HCT 33.1 (*) 36.0 - 46.0 %   MCV 86.6  78.0 - 100.0 fL   MCH 28.0  26.0 - 34.0 pg   MCHC 32.3  30.0 - 36.0 g/dL   RDW 13.3  11.5 - 15.5 %   Platelets 315  150 - 400 K/uL  BASIC METABOLIC PANEL     Status: None   Collection Time    04/23/14 12:18 AM      Result Value Ref Range   Sodium 141  137 - 147 mEq/L   Potassium 4.2  3.7 - 5.3 mEq/L   Chloride 100  96 - 112 mEq/L   CO2 25  19 - 32 mEq/L   Glucose, Bld 92  70 - 99 mg/dL   BUN 12  6 - 23 mg/dL   Creatinine, Ser 0.79  0.50 - 1.10 mg/dL   Calcium 9.6  8.4 - 10.5 mg/dL   GFR calc non Af Amer >90  >90 mL/min   GFR calc Af Amer >90  >90 mL/min   Comment: (NOTE)     The eGFR has been calculated using the CKD EPI equation.     This calculation has not been validated in all clinical situations.     eGFR's persistently <90 mL/min signify possible Chronic Kidney     Disease.  LIPID PANEL     Status: Abnormal   Collection Time    04/23/14 12:18 AM      Result Value Ref Range   Cholesterol 186  0 - 200 mg/dL   Triglycerides 122  <150 mg/dL   HDL 53  >39 mg/dL   Total  CHOL/HDL Ratio 3.5     VLDL 24  0 - 40 mg/dL   LDL Cholesterol 109 (*) 0 - 99 mg/dL   Comment:            Total Cholesterol/HDL:CHD Risk     Coronary Heart Disease Risk Table                         Men   Women      1/2 Average Risk  3.4   3.3      Average Risk       5.0   4.4      2 X Average Risk   9.6   7.1      3 X Average Risk  23.4   11.0                Use the calculated Patient Ratio     above and the CHD Risk Table     to determine the patient's CHD Risk.                ATP III CLASSIFICATION (LDL):      <100     mg/dL   Optimal      100-129  mg/dL   Near or Above                        Optimal      130-159  mg/dL   Borderline      160-189  mg/dL   High      >190     mg/dL   Very High  TROPONIN I     Status: None   Collection Time    04/23/14  6:05 AM      Result Value Ref Range   Troponin I <0.30  <0.30 ng/mL   Comment:            Due to the release kinetics of cTnI,     a negative result within the first hours     of the onset of symptoms does not rule out     myocardial infarction with certainty.     If myocardial infarction is still suspected,     repeat the test at appropriate intervals.   Dg Chest 2 View  04/22/2014   CLINICAL DATA:  Left chest pain radiating into left arm  EXAM: CHEST  2 VIEW  COMPARISON:  Prior chest x-ray 07/14/2013 ; chest CT 06/22/2009  FINDINGS: The lungs are clear and negative for focal airspace consolidation, pulmonary edema or suspicious pulmonary nodule. Small focus of chronic pleural parenchymal scarring in the periphery of the left lower lobe dating back to at least August of 2010. Irregular patchy opacity in the lateral aspect of the right upper lobe overlying the right sixth rib is also chronic and unchanged dating back to at least 2010. This likely represents a second focus of pleural parenchymal scarring. No pleural effusion or pneumothorax. Cardiac and mediastinal contours are within normal limits. No acute fracture or lytic or  blastic osseous lesions. The visualized upper abdominal bowel gas pattern is unremarkable.  IMPRESSION: No active cardiopulmonary disease.  Stable foci of scarring in the periphery of the right upper and left lower lobes.   Electronically Signed   By: Jacqulynn Cadet M.D.   On: 04/22/2014 16:09   Ct Angio Chest Pe W/cm &/or Wo Cm  04/23/2014   CLINICAL DATA:  Chest pain. Lightheadedness and presyncope. Nausea.  EXAM: CT ANGIOGRAPHY CHEST WITH CONTRAST  TECHNIQUE: Multidetector CT imaging of the chest was performed using the standard protocol during bolus administration of intravenous contrast. Multiplanar CT image reconstructions and MIPs were obtained to evaluate the vascular anatomy.  CONTRAST:  13m OMNIPAQUE IOHEXOL 350 MG/ML SOLN  COMPARISON:  Chest radiograph performed 04/22/2014, and CT of the chest performed 06/22/2009  FINDINGS: There is no evidence of pulmonary embolus.  Minimal bibasilar atelectasis or scarring is noted. A normal lymph node  is noted along the right minor fissure. The spiculated density along the right minor fissure is grossly stable from 2010, measuring approximately 1.1 cm, and is likely benign. There is no evidence of significant focal consolidation, pleural effusion or pneumothorax.  The mediastinum is unremarkable in appearance. No mediastinal lymphadenopathy is seen. No pericardial effusion is identified. The great vessels are grossly unremarkable in appearance. Incidental note is made of a direct origin of the left vertebral artery from the aortic arch. No axillary lymphadenopathy is seen. The visualized portions of the thyroid gland are unremarkable in appearance.  The visualized portions of the liver and spleen are unremarkable.  No acute osseous abnormalities are seen.  Review of the MIP images confirms the above findings.  IMPRESSION: 1. No evidence of pulmonary embolus. 2. Minimal bibasilar atelectasis or scarring noted. Stable appearance to spiculated density along the  right minor fissure; this is likely benign.   Electronically Signed   By: Garald Balding M.D.   On: 04/23/2014 02:05    ROS: General:no colds or fevers, no weight changes Skin:no rashes or ulcers HEENT:no blurred vision, no congestion CV:see HPI PUL:see HPI GI:no diarrhea constipation or melena, no indigestion GU:no hematuria, no dysuria MS:no joint pain, no claudication Neuro:no syncope, no lightheadedness Endo:no diabetes, no thyroid disease   Blood pressure 113/64, pulse 59, temperature 98.1 F (36.7 C), temperature source Oral, resp. rate 16, weight 272 lb 4.8 oz (123.514 kg), SpO2 98.00%.  Affect appropriate Obese white female  HEENT: normal Neck supple with no adenopathy JVP normal no bruits no thyromegaly Lungs clear with no wheezing and good diaphragmatic motion Heart:  S1/S2 no murmur, no rub, gallop or click PMI normal Abdomen: benighn, BS positve, no tenderness, no AAA no bruit.  No HSM or HJR Distal pulses intact with no bruits No edema Neuro non-focal Skin warm and dry No muscular weakness    Assessment/Plan Principal Problem:   Chest pain- negative MI Active Problems:   PAF (paroxysmal atrial fibrillation)-maintaining SR   GERD (gastroesophageal reflux disease)   Obesity   Depression   Migraine  Chest Pain:  Prolonged yet no enzymes or ECG changes.  Negative myovue 9/14.  I also reviewed her CT scan and she has no coronary artery calcium.  Favor 24 hr more osbservation and d/c with outpatient f/u  Continue asa and beta blocker  D/C with script for SL nitro  And outpatient f/u with me  Discussed outpatient cath with patient if  Symptoms return  PAF:  No palpitations or evidence of recurrence  Continue flecainide GERD:  Continue prilosec may be etiology of her pain or esophageal spasm  Discussed low carb diet and weight loss   Jenkins Rouge

## 2014-04-23 NOTE — Progress Notes (Signed)
Utilization review complete 

## 2014-04-24 DIAGNOSIS — D649 Anemia, unspecified: Secondary | ICD-10-CM

## 2014-04-24 MED ORDER — NITROGLYCERIN 0.4 MG SL SUBL: 0.4000 mg | SUBLINGUAL_TABLET | SUBLINGUAL | Status: DC | PRN

## 2014-04-24 NOTE — Progress Notes (Signed)
Patient ID: Regina Morales, female   DOB: 05/01/1961, 53 y.o.   MRN: [id number]    Subjective:  Denies SSCP, palpitations or Dyspnea   Objective:  Filed Vitals:   04/23/14 0753 04/23/14 1343 04/23/14 2049 04/24/14 0608  BP: 113/64 111/65 113/52 98/50  Pulse: 59 63 66 63  Temp:  98.2 F (36.8 C) 97.9 F (36.6 C) 97.8 F (36.6 C)  TempSrc:  Oral Oral Oral  Resp:  16 16 16   Weight:      SpO2:  98% 100% 94%    Intake/Output from previous day: No intake or output data in the 24 hours ending 04/24/14 0904  Physical Exam: Affect appropriate Obese white female  HEENT: normal Neck supple with no adenopathy JVP normal no bruits no thyromegaly Lungs clear with no wheezing and good diaphragmatic motion Heart:  S1/S2 no murmur, no rub, gallop or click PMI normal Abdomen: benighn, BS positve, no tenderness, no AAA no bruit.  No HSM or HJR Distal pulses intact with no bruits No edema Neuro non-focal Skin warm and dry No muscular weakness   Lab Results: Basic Metabolic Panel:  Recent Labs  40/98/1106/19/15 1400 04/23/14 0018  NA 140 141  K 4.5 4.2  CL 101 100  CO2 22 25  GLUCOSE 82 92  BUN 11 12  CREATININE 0.65 0.79  CALCIUM 9.9 9.6   Liver Function Tests:  Recent Labs  04/22/14 1545  AST 23  ALT 30  ALKPHOS 105  BILITOT 0.3  PROT 8.0  ALBUMIN 4.5   CBC:  Recent Labs  04/22/14 1400 04/23/14 0018  WBC 6.8 8.0  HGB 12.2 10.7*  HCT 36.8 33.1*  MCV 86.4 86.6  PLT 306 315   Cardiac Enzymes:  Recent Labs  04/22/14 1927 04/23/14 0018 04/23/14 0605  TROPONINI <0.30 <0.30 <0.30   D-Dimer:  Recent Labs  04/22/14 1927  DDIMER 0.73*   Hemoglobin A1C:  Recent Labs  04/22/14 1927  HGBA1C 5.3   Fasting Lipid Panel:  Recent Labs  04/23/14 0018  CHOL 186  HDL 53  LDLCALC 109*  TRIG 122  CHOLHDL 3.5   Thyroid Function Tests:  Recent Labs  04/22/14 1927  TSH 1.260    Imaging: Dg Chest 2 View  04/22/2014   CLINICAL DATA:  Left chest  pain radiating into left arm  EXAM: CHEST  2 VIEW  COMPARISON:  Prior chest x-ray 07/14/2013 ; chest CT 06/22/2009  FINDINGS: The lungs are clear and negative for focal airspace consolidation, pulmonary edema or suspicious pulmonary nodule. Small focus of chronic pleural parenchymal scarring in the periphery of the left lower lobe dating back to at least August of 2010. Irregular patchy opacity in the lateral aspect of the right upper lobe overlying the right sixth rib is also chronic and unchanged dating back to at least 2010. This likely represents a second focus of pleural parenchymal scarring. No pleural effusion or pneumothorax. Cardiac and mediastinal contours are within normal limits. No acute fracture or lytic or blastic osseous lesions. The visualized upper abdominal bowel gas pattern is unremarkable.  IMPRESSION: No active cardiopulmonary disease.  Stable foci of scarring in the periphery of the right upper and left lower lobes.   Electronically Signed   By: Malachy MoanHeath  McCullough M.D.   On: 04/22/2014 16:09   Ct Angio Chest Pe W/cm &/or Wo Cm  04/23/2014   CLINICAL DATA:  Chest pain. Lightheadedness and presyncope. Nausea.  EXAM: CT ANGIOGRAPHY CHEST WITH CONTRAST  TECHNIQUE: Multidetector CT  imaging of the chest was performed using the standard protocol during bolus administration of intravenous contrast. Multiplanar CT image reconstructions and MIPs were obtained to evaluate the vascular anatomy.  CONTRAST:  100mL OMNIPAQUE IOHEXOL 350 MG/ML SOLN  COMPARISON:  Chest radiograph performed 04/22/2014, and CT of the chest performed 06/22/2009  FINDINGS: There is no evidence of pulmonary embolus.  Minimal bibasilar atelectasis or scarring is noted. A normal lymph node is noted along the right minor fissure. The spiculated density along the right minor fissure is grossly stable from 2010, measuring approximately 1.1 cm, and is likely benign. There is no evidence of significant focal consolidation, pleural  effusion or pneumothorax.  The mediastinum is unremarkable in appearance. No mediastinal lymphadenopathy is seen. No pericardial effusion is identified. The great vessels are grossly unremarkable in appearance. Incidental note is made of a direct origin of the left vertebral artery from the aortic arch. No axillary lymphadenopathy is seen. The visualized portions of the thyroid gland are unremarkable in appearance.  The visualized portions of the liver and spleen are unremarkable.  No acute osseous abnormalities are seen.  Review of the MIP images confirms the above findings.  IMPRESSION: 1. No evidence of pulmonary embolus. 2. Minimal bibasilar atelectasis or scarring noted. Stable appearance to spiculated density along the right minor fissure; this is likely benign.   Electronically Signed   By: Roanna RaiderJeffery  Chang M.D.   On: 04/23/2014 02:05    Cardiac Studies:  ECG:   NSR normal no acute ST changes    Telemetry:  NSR no arrhythmia 04/24/2014   Echo: 07/15/13  EF 50-55%  No valve disease   Medications:   . aspirin EC  81 mg Oral Daily  . atorvastatin  80 mg Oral q1800  . buPROPion  150 mg Oral Daily  . cholecalciferol  2,000 Units Oral Daily  . enoxaparin (LOVENOX) injection  40 mg Subcutaneous Q24H  . flecainide  50 mg Oral BID  . metoprolol tartrate  25 mg Oral BID  . multivitamin with minerals  1 tablet Oral Daily  . pantoprazole  40 mg Oral Daily       Assessment/Plan:  Chest Pain:  Atypical r/o no ECG changes  CT negative for PE or aortic pathology D/c home with SL nitro f/u with me in office Cho[facility]  LDL only 109 with no documented CAD  She refused statin last night does not need to go home on it PAF:  maint NSR continue metoprolol and flecainide  Charlton HawsPeter Nishan 04/24/2014, 9:04 AM

## 2014-04-24 NOTE — ED Provider Notes (Signed)
Medical screening examination/treatment/procedure(s) were performed by non-physician practitioner and as supervising physician I was immediately available for consultation/collaboration.   EKG Interpretation None        Laray AngerKathleen M McManus, DO 04/24/14 1450

## 2014-04-24 NOTE — Discharge Summary (Signed)
Physician Discharge Summary  Regina Morales OZH:[id number] DOB: 1961/02/06 DOA: 04/22/2014  PCP: Lupe Carney, MD  Admit date: 04/22/2014 Discharge date: 04/24/2014  Recommendations for Outpatient Follow-up:  1. F/u with Dr. Eden Emms in 2-4 weeks for further evaluation of chest pain.   2. F/u with primary care doctor in 1-2 weeks for repeat CBC to f/u normocytic anemia   Discharge Diagnoses:  Principal Problem:   Chest pain, likely noncardiac, but still unclear etiology Active Problems:   PAF (paroxysmal atrial fibrillation)   GERD (gastroesophageal reflux disease)   Obesity   Depression   Migraine   Discharge Condition: stable, improved  Diet recommendation: healthy heart  Wt Readings from Last 3 Encounters:  04/23/14 123.514 kg (272 lb 4.8 oz)  02/14/14 122.925 kg (271 lb)  08/16/13 121.564 kg (268 lb)    History of present illness:  The patient is a 53 y.o. year-old female with history of chest pain with negative Myoview in 2009, paroxysmal atrial fibrillation, family history of arrhythmias, DVT and pulmonary embolism during pregnancy,previous history of smoking, GERD, obesity, migraine headaches who presents with chest pain. The patient was last at their baseline health this morning when she went to work. around noon, she developed some left chest discomfort, rated 3/10. She did not have associated shortness of breath at that time. After about 20 minutes, the pain became worse, 8/10 and radiated to her left shoulder and slightly around her left lower chest to the left upper quadrant. she denies associated shortness of breath, but had lightheadedness and presyncope. She became clammy and sweaty and felt nauseated. She called for help because she thought she was going to pass out. She denies associated palpitations. She states the sensation she experienced today was very different from her previous pulmonary embolism and from her paroxysmal atrial fibrillation. Her colleague called  EMS, and by the time EMS arrived, her pain was a 5/10. She was given nitroglycerin, which improved her pain to a 2/10. In the emergency department, her pain was stable at 2/10 for several hours, but over the last 20 minutes, her pain is increased. Nitroglycerin paste was applied which improved her pain back to a 2/10.   In the emergency department, her vital signs were stable. CBC and BMP were within normal limits. Her troponin was negative, and her EKG was stable from prior with no evidence of acute ischemia. She is currently in sinus rhythm. She is being admitted for chest pain evaluation.   Hospital Course:   Chest pain, unclear etiology.  Her chest pain was atypical in that she did not have shortness of breath and many of her associated symptoms seem to be related to presyncope. Her telemetry demonstrated normal sinus rhythm with occasional PACs. Her troponins were negative. Her d-dimer was mildly elevated so followup CT in June of chest was obtained which was negative for pulmonary embolism or other explanation for her chest pain. She was started on aspirin, beta blocker, and empiric statin. Her lipid panel demonstrated an HDL of 53, LDL of 109. Her hemoglobin A1c was 5.3. Initially, her pain seemed to be getting better with nitroglycerin and she was given nitroglycerin paste. This also eased her pain somewhat. She was seen by cardiology who has recommended that she be discharged with nitroglycerin as needed and followup with him in clinic in approximately 2 weeks for further consideration of stress test.  She did not have significant coronary artery calcifications on CT angio chest so she was not given a prescription for statin  at time of discharge.     Normocytic anemia, likely hemodilutional.  Will have primary care doctor repeat in a few weeks and if this persists, further work up for anemia if not already complete.    PAF (lone a-fib), currently in sinus rhythm, continued aspirin, beta blocker,  and flecainide Depression/anxiety, stable, continued wellbutrin  GERD, stable, continued PPI  Migraine, stable, continued fioricet prn  Obesity, defer to PCP for counseling on weight loss    Consultants:  Cardiology Procedures:  CXR  CT angio chest Antibiotics:  none    Discharge Exam: Filed Vitals:   04/24/14 0608  BP: 98/50  Pulse: 63  Temp: 97.8 F (36.6 C)  Resp: 16   Filed Vitals:   04/23/14 0753 04/23/14 1343 04/23/14 2049 04/24/14 0608  BP: 113/64 111/65 113/52 98/50  Pulse: 59 63 66 63  Temp:  98.2 F (36.8 C) 97.9 F (36.6 C) 97.8 F (36.6 C)  TempSrc:  Oral Oral Oral  Resp:  16 16 16   Weight:      SpO2:  98% 100% 94%    General: CF, No acute distress, pleasant and smiling.  No further episodes of pain since yesterday morning. HEENT: NCAT, MMM  Cardiovascular: RRR, nl S1, S2 no mrg, 2+ pulses, warm extremities  Respiratory: CTAB, no increased WOB  Abdomen: NABS, soft, NT/ND  MSK: Normal tone and bulk, no LEE  Neuro: Grossly intact   Discharge Instructions      Discharge Instructions   Call MD for:  difficulty breathing, headache or visual disturbances    Complete by:  As directed      Call MD for:  extreme fatigue    Complete by:  As directed      Call MD for:  hives    Complete by:  As directed      Call MD for:  persistant dizziness or light-headedness    Complete by:  As directed      Call MD for:  persistant nausea and vomiting    Complete by:  As directed      Call MD for:  severe uncontrolled pain    Complete by:  As directed      Call MD for:  temperature >100.4    Complete by:  As directed      Diet - low sodium heart healthy    Complete by:  As directed      Discharge instructions    Complete by:  As directed   You were hospitalized with chest pain.  You did not have a heart attack or a blood clot in the lungs.  Your heart rhythm has been fine since coming to the hospital.  Please continue your aspirin, flecainide, and metoprolol  as before.  If you have another episode of chest pressure, please try taking nitroglycerin.  If it helps your pain go away and stay away, follow up with cardiology.  If you have severe chest pain with shortness of breath, please call 911 immediately.     Increase activity slowly    Complete by:  As directed             Medication List         aspirin 81 MG EC tablet  Take 1 tablet (81 mg total) by mouth daily.     buPROPion 150 MG 24 hr tablet  Commonly known as:  WELLBUTRIN XL  Take 150 mg by mouth daily.     butalbital-acetaminophen-caffeine 50-325-40 MG per tablet  Commonly known as:  FIORICET, ESGIC  Take 1 tablet by mouth 2 (two) times daily as needed for headache.     flecainide 50 MG tablet  Commonly known as:  TAMBOCOR  Take 1 tablet (50 mg total) by mouth 2 (two) times daily.     metoprolol tartrate 25 MG tablet  Commonly known as:  LOPRESSOR  Take 1 tablet (25 mg total) by mouth 2 (two) times daily.     multivitamin with minerals tablet  Take 1 tablet by mouth daily.     nitroGLYCERIN 0.4 MG SL tablet  Commonly known as:  NITROSTAT  Place 1 tablet (0.4 mg total) under the tongue every 5 (five) minutes as needed for chest pain (hold for SBP < 110).     omeprazole 20 MG capsule  Commonly known as:  PRILOSEC  Take 20 mg by mouth 2 (two) times daily before a meal.     Vitamin D 2000 UNITS tablet  Take 2,000 Units by mouth daily.       Follow-up Information   [facility] Schedule an appointment as soon as possible for a visit in 2 weeks.   Specialty:  Family Medicine   Contact information:   [address] [place] [address] [phone]       Follow up with Charlton HawsPeter Nishan, MD. Schedule an appointment as soon as possible for a visit in 2 weeks.   Specialty:  Cardiology   Contact information:   [address] [place] [address] [phone]        The results of significant diagnostics from  this hospitalization (including imaging, microbiology, ancillary and laboratory) are listed below for reference.    Significant Diagnostic Studies: Dg Chest 2 View  04/22/2014   CLINICAL DATA:  Left chest pain radiating into left arm  EXAM: CHEST  2 VIEW  COMPARISON:  Prior chest x-ray 07/14/2013 ; chest CT 06/22/2009  FINDINGS: The lungs are clear and negative for focal airspace consolidation, pulmonary edema or suspicious pulmonary nodule. Small focus of chronic pleural parenchymal scarring in the periphery of the left lower lobe dating back to at least August of 2010. Irregular patchy opacity in the lateral aspect of the right upper lobe overlying the right sixth rib is also chronic and unchanged dating back to at least 2010. This likely represents a second focus of pleural parenchymal scarring. No pleural effusion or pneumothorax. Cardiac and mediastinal contours are within normal limits. No acute fracture or lytic or blastic osseous lesions. The visualized upper abdominal bowel gas pattern is unremarkable.  IMPRESSION: No active cardiopulmonary disease.  Stable foci of scarring in the periphery of the right upper and left lower lobes.   Electronically Signed   By: Malachy MoanHeath  McCullough M.D.   On: 04/22/2014 16:09   Ct Angio Chest Pe W/cm &/or Wo Cm  04/23/2014   CLINICAL DATA:  Chest pain. Lightheadedness and presyncope. Nausea.  EXAM: CT ANGIOGRAPHY CHEST WITH CONTRAST  TECHNIQUE: Multidetector CT imaging of the chest was performed using the standard protocol during bolus administration of intravenous contrast. Multiplanar CT image reconstructions and MIPs were obtained to evaluate the vascular anatomy.  CONTRAST:  100mL OMNIPAQUE IOHEXOL 350 MG/ML SOLN  COMPARISON:  Chest radiograph performed 04/22/2014, and CT of the chest performed 06/22/2009  FINDINGS: There is no evidence of pulmonary embolus.  Minimal bibasilar atelectasis or scarring is noted. A normal lymph node is noted along the right minor  fissure. The  spiculated density along the right minor fissure is grossly stable from 2010, measuring approximately 1.1 cm, and is likely benign. There is no evidence of significant focal consolidation, pleural effusion or pneumothorax.  The mediastinum is unremarkable in appearance. No mediastinal lymphadenopathy is seen. No pericardial effusion is identified. The great vessels are grossly unremarkable in appearance. Incidental note is made of a direct origin of the left vertebral artery from the aortic arch. No axillary lymphadenopathy is seen. The visualized portions of the thyroid gland are unremarkable in appearance.  The visualized portions of the liver and spleen are unremarkable.  No acute osseous abnormalities are seen.  Review of the MIP images confirms the above findings.  IMPRESSION: 1. No evidence of pulmonary embolus. 2. Minimal bibasilar atelectasis or scarring noted. Stable appearance to spiculated density along the right minor fissure; this is likely benign.   Electronically Signed   By: Roanna RaiderJeffery  Chang M.D.   On: 04/23/2014 02:05    Microbiology: No results found for this or any previous visit (from the past 240 hour(s)).   Labs: Basic Metabolic Panel:  Recent Labs Lab 04/22/14 1400 04/23/14 0018  NA 140 141  K 4.5 4.2  CL 101 100  CO2 22 25  GLUCOSE 82 92  BUN 11 12  CREATININE 0.65 0.79  CALCIUM 9.9 9.6   Liver Function Tests:  Recent Labs Lab 04/22/14 1545  AST 23  ALT 30  ALKPHOS 105  BILITOT 0.3  PROT 8.0  ALBUMIN 4.5   No results found for this basename: LIPASE, AMYLASE,  in the last 168 hours No results found for this basename: AMMONIA,  in the last 168 hours CBC:  Recent Labs Lab 04/22/14 1400 04/23/14 0018  WBC 6.8 8.0  HGB 12.2 10.7*  HCT 36.8 33.1*  MCV 86.4 86.6  PLT 306 315   Cardiac Enzymes:  Recent Labs Lab 04/22/14 1927 04/23/14 0018 04/23/14 0605  TROPONINI <0.30 <0.30 <0.30   BNP: BNP (last 3 results) No results found for  this basename: PROBNP,  in the last 8760 hours CBG: No results found for this basename: GLUCAP,  in the last 168 hours  Time coordinating discharge: 45 minutes  Signed:  Jolaine Fryberger  [facility] 04/24/2014, 9:22 AM

## 2014-04-25 ENCOUNTER — Telehealth: Payer: Self-pay | Admitting: *Deleted

## 2014-04-25 NOTE — Telephone Encounter (Signed)
lmovm to r/s ov appointment, Lawson FiscalLori will be in meetings

## 2014-05-16 ENCOUNTER — Encounter: Payer: Self-pay | Admitting: Cardiovascular Disease

## 2014-05-23 ENCOUNTER — Encounter: Payer: BC Managed Care – PPO | Admitting: Nurse Practitioner

## 2014-06-21 ENCOUNTER — Encounter: Payer: BC Managed Care – PPO | Admitting: Cardiovascular Disease

## 2014-06-22 ENCOUNTER — Encounter: Payer: BC Managed Care – PPO | Admitting: Cardiovascular Disease

## 2014-07-12 ENCOUNTER — Encounter: Payer: BC Managed Care – PPO | Admitting: Cardiovascular Disease

## 2014-07-18 ENCOUNTER — Encounter: Payer: Self-pay | Admitting: Physician Assistant

## 2014-07-18 ENCOUNTER — Ambulatory Visit (INDEPENDENT_AMBULATORY_CARE_PROVIDER_SITE_OTHER): Payer: BC Managed Care – PPO | Admitting: Physician Assistant

## 2014-07-18 ENCOUNTER — Encounter (HOSPITAL_COMMUNITY): Payer: BC Managed Care – PPO

## 2014-07-18 VITALS — BP 122/80 | HR 66 | Ht 67.0 in | Wt 279.0 lb

## 2014-07-18 DIAGNOSIS — I480 Paroxysmal atrial fibrillation: Secondary | ICD-10-CM

## 2014-07-18 DIAGNOSIS — I4891 Unspecified atrial fibrillation: Secondary | ICD-10-CM

## 2014-07-18 DIAGNOSIS — R079 Chest pain, unspecified: Secondary | ICD-10-CM

## 2014-07-18 DIAGNOSIS — E669 Obesity, unspecified: Secondary | ICD-10-CM

## 2014-07-18 NOTE — Progress Notes (Signed)
HPI: This is a 53 year old female patient of Dr.Nishan who is here for post hospital followup after admission with chest pain in June 2015. Her d-dimer was elevated but CT scan was negative for pulmonary embolus.She did not have significant coronary artery calcification on CTangio of her chest. Troponins were negative. She was given nitroglycerin and plan was to followup with Dr. Eden Emms for further consideration of a stress test versus cardiac catheterization if symptoms return.  Patient has history of negative Myoview in 2014, paroxysmal atrial fibrillation on Flecainide, DVT and pulmonary embolus during pregnancy, obesity, GERD, migraine headaches.  The patient states that she's had several episodes of feeling lightheaded and short of breath associated with a fluttering. These episodes usually last less than 1 minute and the way he spontaneously. If they last longer than 1 minute she has associated chest heaviness. Since hospitalization she has not taken any nitroglycerin or extra flecainide. She does not exercise but does her own housework without symptoms. She also has a lot to reflux symptoms so was hard to distinguish.  Allergies  Allergen Reactions  . Codeine Nausea And Vomiting     Current Outpatient Prescriptions  Medication Sig Dispense Refill  . aspirin EC 81 MG EC tablet Take 1 tablet (81 mg total) by mouth daily.      Marland Kitchen buPROPion (WELLBUTRIN XL) 150 MG 24 hr tablet Take 150 mg by mouth daily.      . butalbital-acetaminophen-caffeine (FIORICET, ESGIC) 50-325-40 MG per tablet Take 1 tablet by mouth 2 (two) times daily as needed for headache.      . Cholecalciferol (VITAMIN D) 2000 UNITS tablet Take 2,000 Units by mouth daily.      . flecainide (TAMBOCOR) 50 MG tablet Take 1 tablet (50 mg total) by mouth 2 (two) times daily.  180 tablet  3  . metoprolol tartrate (LOPRESSOR) 25 MG tablet Take 1 tablet (25 mg total) by mouth 2 (two) times daily.  180 tablet  3  . Multiple  Vitamins-Minerals (MULTIVITAMIN WITH MINERALS) tablet Take 1 tablet by mouth daily.      . nitroGLYCERIN (NITROSTAT) 0.4 MG SL tablet Place 1 tablet (0.4 mg total) under the tongue every 5 (five) minutes as needed for chest pain (hold for SBP < 110).  30 tablet  0  . omeprazole (PRILOSEC) 20 MG capsule Take 20 mg by mouth 2 (two) times daily before a meal.        No current facility-administered medications for this visit.    Past Medical History  Diagnosis Date  . Chest pain     a. 2009: neg MV  ([facility])  . GERD (gastroesophageal reflux disease)   . Obesity   . PAF (paroxysmal atrial fibrillation)     a. Dx 07/2013;  b. CHA2DS2VASc=1 (female);  c. 07/2013 Echo:  EF 50-55%, no rwma, mod LVH.  Marland Kitchen Hepatic steatosis   . Atrial fibrillation   . DVT (deep vein thrombosis) in pregnancy 1980's  . Pulmonary embolism ~ 1984    "after I had my last child"  . History of blood transfusion     "low count after appy"   . Migraines     "maybe once/2 months" (04/22/2014)  . Anxiety   . Depression   . Buzzing in ear     "right; even after OR"    Past Surgical History  Procedure Laterality Date  . Inner ear surgery Right 2000's  . Appendectomy  1990's  . Tubal ligation  1985  Family History  Problem Relation Age of Onset  . Lung cancer Father     died @ 70, ETOH abuse  . Kidney failure Mother     s/p renal Tx  . Congestive Heart Failure Mother     died @ 56  . Atrial fibrillation Brother     alive @ 74  . Other Brother     died in MVA  . Other Daughter     cardiac arrest  . Cardiomyopathy Daughter   . Other Other     WPW in granddaughter    History   Social History  . Marital Status: Married    Spouse Name: N/A    Number of Children: N/A  . Years of Education: N/A   Occupational History  . Not on file.   Social History Main Topics  . Smoking status: Former Smoker -- 1.00 packs/day for 8 years    Types: Cigarettes    Quit date: 11/04/1990  . Smokeless tobacco: Never  Used  . Alcohol Use: No  . Drug Use: No  . Sexual Activity: Not Currently   Other Topics Concern  . Not on file   Social History Narrative   Lives in [place] with sister.  Does not routinely exercise.  Works in Teacher, early years/pre for [employer].    ROS: See history of present illness otherwise negative  BP 122/80  Pulse 66  Ht  (1.702 m)  Wt 126.554 kg (279 lb)  BMI 43.69 kg/m2   PHYSICAL EXAM: Obese, in no acute distress. Neck: No JVD, HJR, Bruit, or thyroid enlargement  Lungs: No tachypnea, clear without wheezing, rales, or rhonchi  Cardiovascular: RRR, PMI not displaced, heart sounds normal, no murmurs, gallops, bruit, thrill, or heave.  Abdomen: BS normal. Soft without organomegaly, masses, lesions or tenderness.  Extremities: without cyanosis, clubbing or edema. Good distal pulses bilateral  SKin: Warm, no lesions or rashes   Musculoskeletal: No deformities  Neuro: no focal signs   Wt Readings from Last 3 Encounters:  04/23/14 123.514 kg (272 lb 4.8 oz)  02/14/14 122.925 kg (271 lb)  08/16/13 121.564 kg (268 lb)     EKG: Normal sinus rhythm at 66 beats per minute

## 2014-07-18 NOTE — Assessment & Plan Note (Signed)
Patient had recent hospitalization with chest pain. She continues to have occasional chest pain associated with lightheadedness shortness of breath and fluttering. We'll order a stress Myoview to rule out ischemia. Followup with Dr.Nishan.

## 2014-07-18 NOTE — Patient Instructions (Signed)
Your physician has requested that you have en exercise stress myoview. For further information please visit [email or web address] Please follow instruction sheet, as given.  Your physician recommends that you continue on your current medications as directed. Please refer to the Current Medication list given to you today.   Your physician recommends that you schedule a follow-up appointment in: 1 month with Dr. Eden Emms after stress test

## 2014-07-18 NOTE — Assessment & Plan Note (Signed)
Patient is maintaining normal sinus rhythm on flecainide.

## 2014-07-18 NOTE — Assessment & Plan Note (Signed)
Exercise and weight loss recommended. 

## 2014-08-01 ENCOUNTER — Ambulatory Visit (HOSPITAL_COMMUNITY): Payer: BC Managed Care – PPO | Attending: Internal Medicine | Admitting: Radiology

## 2014-08-01 VITALS — BP 123/75 | Ht 67.0 in | Wt 276.0 lb

## 2014-08-01 DIAGNOSIS — R42 Dizziness and giddiness: Secondary | ICD-10-CM | POA: Insufficient documentation

## 2014-08-01 DIAGNOSIS — R079 Chest pain, unspecified: Secondary | ICD-10-CM | POA: Diagnosis present

## 2014-08-01 DIAGNOSIS — I4949 Other premature depolarization: Secondary | ICD-10-CM

## 2014-08-01 DIAGNOSIS — R002 Palpitations: Secondary | ICD-10-CM | POA: Diagnosis not present

## 2014-08-01 DIAGNOSIS — R5383 Other fatigue: Secondary | ICD-10-CM

## 2014-08-01 DIAGNOSIS — R0989 Other specified symptoms and signs involving the circulatory and respiratory systems: Secondary | ICD-10-CM | POA: Insufficient documentation

## 2014-08-01 DIAGNOSIS — R0609 Other forms of dyspnea: Secondary | ICD-10-CM | POA: Diagnosis not present

## 2014-08-01 DIAGNOSIS — R0602 Shortness of breath: Secondary | ICD-10-CM

## 2014-08-01 DIAGNOSIS — I4891 Unspecified atrial fibrillation: Secondary | ICD-10-CM | POA: Diagnosis not present

## 2014-08-01 DIAGNOSIS — R5381 Other malaise: Secondary | ICD-10-CM | POA: Diagnosis not present

## 2014-08-01 MED ORDER — TECHNETIUM TC 99M SESTAMIBI GENERIC - CARDIOLITE
33.0000 | Freq: Once | INTRAVENOUS | Status: AC | PRN
  Administered 2014-08-01: 33 via INTRAVENOUS

## 2014-08-01 NOTE — Progress Notes (Signed)
[facility] SITE 3 NUCLEAR MED [address] [place], [place] [address] [phone]    Cardiology Nuclear Med Study  Regina Morales is a 53 y.o. female     MRN : [id number]     DOB: 07-07-1961  Procedure Date: 08/01/2014  Nuclear Med Background Indication for Stress Test:  Evaluation for Ischemia History:  '14 MPI: EF: 57%, AFIB Cardiac Risk Factors: Atrial Fib  Symptoms:  DOE, Fatigue, Light-Headedness, Palpitations and SOB   Nuclear Pre-Procedure Caffeine/Decaff Intake:  None NPO After: 7:00pm   Lungs:  clear O2 Sat: 98% on room air. IV 0.9% NS with Angio Cath:  22g  IV Site: R Hand  IV Started by:  Cathlyn Parsons, RN  Chest Size (in):  42 Cup Size: D  Height:  (1.702 m)  Weight:  276 lb (125.193 kg)  BMI:  Body mass index is 43.22 kg/(m^2). Tech Comments:  No Metoprolol x 24 hrs. In comparison to her last stress test. She wasn't able to walk as long, she was HTN, and she was very SOB. She states this is all new. S.Williams EMTP    Nuclear Med Study 1 or 2 day study: 2 day  Stress Test Type:  Stress  Reading MD: n/a  Order Authorizing Provider:  Wyvonna Plum  Resting Radionuclide: Technetium 75m Sestamibi  Resting Radionuclide Dose: 33.0 mCi on 08/02/2014   Stress Radionuclide:  Technetium 60m Sestamibi  Stress Radionuclide Dose: 33.0 mCi on 08/01/2014          Stress Protocol Rest HR: 69 Stress HR: 153  Rest BP: 123/75 Stress BP: 195/61  Exercise Time (min): 5:01 METS: 7.0   Predicted Max HR: 169 bpm % Max HR: 90.53 bpm Rate Pressure Product: 21308   Dose of Adenosine (mg):  n/a Dose of Lexiscan: n/a mg  Dose of Atropine (mg): n/a Dose of Dobutamine: n/a mcg/kg/min (at max HR)  Stress Test Technologist: Milana Na, EMT-P  Nuclear Technologist:  Kerby Nora, CNMT     Rest Procedure:  Myocardial perfusion imaging was performed at rest 45 minutes following the intravenous administration of Technetium 67m Sestamibi. Rest ECG: NSR  - Normal EKG  Stress Procedure:  The patient exercised on the treadmill utilizing the Bruce Protocol for 5:01 minutes. The patient stopped due to fatigue, sob, and denied any chest pain.  Technetium 71m Sestamibi was injected at peak exercise and myocardial perfusion imaging was performed after a brief delay. Stress ECG: There are scattered PVCs.  QPS Raw Data Images:  Normal; no motion artifact; normal heart/lung ratio. Stress Images:  There is decreased uptake in the apex. Rest Images:  There is decreased uptake in the apex. Subtraction (SDS):  No reversibility is appreciated. Transient Ischemic Dilatation (Normal <1.22):  0.89 Lung/Heart Ratio (Normal <0.45):  0.44  Quantitative Gated Spect Images QGS EDV:  119 ml QGS ESV:  48 ml  Impression Exercise Capacity:  Fair exercise capacity. BP Response:  Hypertensive blood pressure response. Clinical Symptoms:  fatigue, dyspnea ECG Impression:  No significant ST segment change suggestive of ischemia. Comparison with Prior Nuclear Study: No significant change from previous study  Overall Impression:  Low risk stress nuclear study with fixed apical defect. No reversible ischemia.  LV Ejection Fraction: 60%.  LV Wall Motion:  Normal Wall Motion  Chrystie Nose, MD, [facility] Board Certified in Nuclear Cardiology Attending Cardiologist [facility]

## 2014-08-02 ENCOUNTER — Ambulatory Visit (HOSPITAL_COMMUNITY): Payer: BC Managed Care – PPO | Admitting: Radiology

## 2014-08-02 DIAGNOSIS — R0989 Other specified symptoms and signs involving the circulatory and respiratory systems: Secondary | ICD-10-CM

## 2014-08-02 MED ORDER — TECHNETIUM TC 99M SESTAMIBI GENERIC - CARDIOLITE
33.0000 | Freq: Once | INTRAVENOUS | Status: AC | PRN
  Administered 2014-08-02: 33 via INTRAVENOUS

## 2014-11-18 ENCOUNTER — Other Ambulatory Visit: Payer: Self-pay | Admitting: Physician Assistant

## 2014-11-18 ENCOUNTER — Ambulatory Visit
Admission: RE | Admit: 2014-11-18 | Discharge: 2014-11-18 | Disposition: A | Discharge status: Home / Self Care | Payer: BLUE CROSS/BLUE SHIELD | Source: Ambulatory Visit | Attending: Physician Assistant | Admitting: Physician Assistant

## 2014-11-18 DIAGNOSIS — R52 Pain, unspecified: Secondary | ICD-10-CM

## 2015-02-04 ENCOUNTER — Other Ambulatory Visit: Payer: Self-pay | Admitting: Cardiovascular Disease

## 2015-02-15 NOTE — Progress Notes (Signed)
Patient ID: Regina Morales, female   DOB: 07/07/1961, 54 y.o.   MRN: [id number]            HPI: This is a 54 y.o.  female f/u for PAF and previous chest pain  June 2015 seen in hospital  Her d-dimer was elevated but CT scan was negative for pulmonary embolus.She did not have significant coronary artery calcification on CTangio of her chest. Troponins were negative. She was given nitroglycerin Patient has history of negative Myoview in 2014, paroxysmal atrial fibrillation on Flecainide, DVT and pulmonary embolus during pregnancy, obesity, GERD, migraine headaches.  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 Codeine Nausea And Vomiting     Current Outpatient Prescriptions  Medication Sig Dispense Refill  . amLODipine (NORVASC) 2.5 MG tablet Take 2.5 mg by mouth daily.    Marland Kitchen. aspirin EC 81 MG EC tablet Take 1 tablet (81 mg total) by mouth daily.    Marland Kitchen. buPROPion (WELLBUTRIN XL) 150 MG 24 hr tablet Take 150 mg by mouth daily.    . butalbital-acetaminophen-caffeine (FIORICET, ESGIC) 50-325-40 MG per tablet Take 1 tablet by mouth 2 (two) times daily as needed for headache.    . Cholecalciferol (VITAMIN D) 2000 UNITS tablet Take 2,000 Units by mouth daily.    . ferrous sulfate 325 (65 FE) MG EC tablet Take 325 mg by mouth daily.    . flecainide (TAMBOCOR) 50 MG tablet TAKE 1 TABLET TWICE A DAY 180 tablet 0  . metoprolol tartrate (LOPRESSOR) 25 MG tablet TAKE 1 TABLET TWICE A DAY 180 tablet 0  . Multiple Vitamins-Minerals (MULTIVITAMIN WITH MINERALS) tablet Take 1 tablet by mouth daily.    . nitroGLYCERIN (NITROSTAT) 0.4 MG SL tablet Place 1 tablet (0.4 mg total) under the tongue every 5 (five) minutes as needed for chest pain (hold for SBP < 110). 30 tablet 0  . omeprazole (PRILOSEC) 20  MG capsule Take 20 mg by mouth 2 (two) times daily before a meal.      No current facility-administered medications for this visit.    Past Medical History  Diagnosis Date  . Chest pain     a. 2009: neg MV  ([facility])  . GERD (gastroesophageal reflux disease)   . Obesity   . PAF (paroxysmal atrial fibrillation)     a. Dx 07/2013;  b. CHA2DS2VASc=1 (female);  c. 07/2013 Echo:  EF 50-55%, no rwma, mod LVH.  Marland Kitchen. Hepatic steatosis   . Atrial fibrillation   . DVT (deep vein thrombosis) in pregnancy 1980's  . Pulmonary embolism ~ 1984    "after I had my last child"  . History of blood transfusion     "low count after appy"   . Migraines     "maybe once/2 months" (04/22/2014)  . Anxiety   . Depression   . Buzzing in ear     "right; even after OR"    Past Surgical History  Procedure Laterality Date  . Inner ear surgery Right 2000's  . Appendectomy  1990's  . Tubal ligation  1985    Family History  Problem Relation Age of Onset  . Lung cancer Father     died @ 5353, ETOH abuse  . Kidney failure Mother  s/p renal Tx  . Congestive Heart Failure Mother     died @ 72  . Atrial fibrillation Brother     alive @ 2  . Other Brother     died in MVA  . Other Daughter     cardiac arrest  . Cardiomyopathy Daughter   . Other Other     WPW in granddaughter    History   Social History  . Marital Status: Legally Separated    Spouse Name: N/A  . Number of Children: N/A  . Years of Education: N/A   Occupational History  . Not on file.   Social History Main Topics  . Smoking status: Former Smoker -- 1.00 packs/day for 8 years    Types: Cigarettes    Quit date: 11/04/1990  . Smokeless tobacco: Never Used  . Alcohol Use: No  . Drug Use: No  . Sexual Activity: Not Currently   Other Topics Concern  . Not on file   Social History Narrative   Lives in [place] with sister.  Does not routinely exercise.  Works in Teacher, early years/pre for [employer].    ROS: See history of present illness  otherwise negative  BP 110/70 mmHg  Pulse 61  Ht  (1.702 m)  Wt 244 lb 12.8 oz (111.041 kg)  BMI 38.33 kg/m2   PHYSICAL EXAM: Obese, in no acute distress. Neck: No JVD, HJR, Bruit, or thyroid enlargement  Lungs: No tachypnea, clear without wheezing, rales, or rhonchi  Cardiovascular: RRR, PMI not displaced, heart sounds normal, no murmurs, gallops, bruit, thrill, or heave.  Abdomen: BS normal. Soft without organomegaly, masses, lesions or tenderness.  Extremities: without cyanosis, clubbing or edema. Good distal pulses bilateral  SKin: Warm, no lesions or rashes   Musculoskeletal: No deformities  Neuro: no focal signs   Wt Readings from Last 3 Encounters:  02/16/15 244 lb 12.8 oz (111.041 kg)  08/01/14 276 lb (125.193 kg)  07/18/14 279 lb (126.554 kg)     EKG:  07/18/14  Normal sinus rhythm at 66 beats per minute

## 2015-02-16 ENCOUNTER — Encounter: Payer: Self-pay | Admitting: Cardiovascular Disease

## 2015-02-16 ENCOUNTER — Ambulatory Visit (INDEPENDENT_AMBULATORY_CARE_PROVIDER_SITE_OTHER): Payer: BLUE CROSS/BLUE SHIELD | Admitting: Cardiovascular Disease

## 2015-02-16 VITALS — BP 110/70 | HR 61 | Ht 67.0 in | Wt 244.8 lb

## 2015-02-16 DIAGNOSIS — R0789 Other chest pain: Secondary | ICD-10-CM | POA: Diagnosis not present

## 2015-02-16 DIAGNOSIS — I480 Paroxysmal atrial fibrillation: Secondary | ICD-10-CM | POA: Diagnosis not present

## 2015-02-16 NOTE — Assessment & Plan Note (Signed)
Atypical resolved  Normal stress test  Observe

## 2015-02-16 NOTE — Assessment & Plan Note (Signed)
Maint NSR no palpitations ASA  Improved

## 2015-02-16 NOTE — Patient Instructions (Signed)
Your physician wants you to follow-up in: YEAR WITH DR NISHAN  You will receive a reminder letter in the mail two months in advance. If you don't receive a letter, please call our office to schedule the follow-up appointment.  Your physician recommends that you continue on your current medications as directed. Please refer to the Current Medication list given to you today. 

## 2015-04-24 ENCOUNTER — Emergency Department (HOSPITAL_COMMUNITY)
Admission: EM | Admit: 2015-04-24 | Discharge: 2015-04-25 | Disposition: A | Discharge status: Home / Self Care | Payer: BLUE CROSS/BLUE SHIELD | Attending: Emergency Medicine | Admitting: Emergency Medicine

## 2015-04-24 ENCOUNTER — Encounter (HOSPITAL_COMMUNITY): Payer: Self-pay | Admitting: *Deleted

## 2015-04-24 ENCOUNTER — Emergency Department (HOSPITAL_COMMUNITY): Payer: BLUE CROSS/BLUE SHIELD

## 2015-04-24 DIAGNOSIS — Z86718 Personal history of other venous thrombosis and embolism: Secondary | ICD-10-CM | POA: Insufficient documentation

## 2015-04-24 DIAGNOSIS — I480 Paroxysmal atrial fibrillation: Secondary | ICD-10-CM | POA: Diagnosis not present

## 2015-04-24 DIAGNOSIS — R42 Dizziness and giddiness: Secondary | ICD-10-CM | POA: Diagnosis not present

## 2015-04-24 DIAGNOSIS — K219 Gastro-esophageal reflux disease without esophagitis: Secondary | ICD-10-CM | POA: Diagnosis not present

## 2015-04-24 DIAGNOSIS — Z79899 Other long term (current) drug therapy: Secondary | ICD-10-CM | POA: Insufficient documentation

## 2015-04-24 DIAGNOSIS — R0789 Other chest pain: Secondary | ICD-10-CM

## 2015-04-24 DIAGNOSIS — G43909 Migraine, unspecified, not intractable, without status migrainosus: Secondary | ICD-10-CM | POA: Diagnosis not present

## 2015-04-24 DIAGNOSIS — E669 Obesity, unspecified: Secondary | ICD-10-CM | POA: Diagnosis not present

## 2015-04-24 DIAGNOSIS — Z86711 Personal history of pulmonary embolism: Secondary | ICD-10-CM | POA: Insufficient documentation

## 2015-04-24 DIAGNOSIS — Z7982 Long term (current) use of aspirin: Secondary | ICD-10-CM | POA: Insufficient documentation

## 2015-04-24 DIAGNOSIS — Z87891 Personal history of nicotine dependence: Secondary | ICD-10-CM | POA: Diagnosis not present

## 2015-04-24 DIAGNOSIS — R079 Chest pain, unspecified: Secondary | ICD-10-CM | POA: Diagnosis present

## 2015-04-24 LAB — CBC
HCT: 35.8 % — ABNORMAL LOW (ref 36.0–46.0)
Hemoglobin: 12 g/dL (ref 12.0–15.0)
MCH: 29.3 pg (ref 26.0–34.0)
MCHC: 33.5 g/dL (ref 30.0–36.0)
MCV: 87.5 fL (ref 78.0–100.0)
PLATELETS: 261 10*3/uL (ref 150–400)
RBC: 4.09 MIL/uL (ref 3.87–5.11)
RDW: 12.4 % (ref 11.5–15.5)
WBC: 7 10*3/uL (ref 4.0–10.5)

## 2015-04-24 LAB — BASIC METABOLIC PANEL
Anion gap: 8 (ref 5–15)
BUN: 12 mg/dL (ref 6–20)
CO2: 28 mmol/L (ref 22–32)
CREATININE: 0.69 mg/dL (ref 0.44–1.00)
Calcium: 9.2 mg/dL (ref 8.9–10.3)
Chloride: 103 mmol/L (ref 101–111)
GFR calc Af Amer: >60 mL/min (ref >60)
Glucose, Bld: 100 mg/dL — ABNORMAL HIGH (ref 65–99)
Potassium: 3.4 mmol/L — ABNORMAL LOW (ref 3.5–5.1)
Sodium: 139 mmol/L (ref 135–145)

## 2015-04-24 LAB — I-STAT TROPONIN, ED: TROPONIN I, POC: 0 ng/mL (ref 0.00–0.08)

## 2015-04-24 NOTE — ED Notes (Signed)
Pt endorses central non-radiating chest pain lasting appx 3 hours started around 6pm. Pt took 4 gas-X with no relief. Approximately 9pm pain worsened and radiated to bilateral left and right chest- pressure made chest feel like it was going to explode. Pt has associated shortness of breath, bilateral blurry vision, diaphoresis and lightheadedness like she felt she was going to pas out but without syncope. Pt assisted self to chair and took 1 SL nitroglycerin. Had pain relief. Pt given 2nd nitro by EMS with complete relief. Pt denies symptoms or pain at present time. Pt endorses anxiety presently.

## 2015-04-24 NOTE — ED Notes (Signed)
Patient stated about 630pm she started with CP pressure or tightness to the left chest and then it radiatd to the right chest.  She took 1 NTG and 324mg  ASA at home and then the pain was 8-9.  Was SOB at that time but that was releived after the NTG.  She took her Lopressor at 8pm.  History of A Fib  EMS adm 1 NTG and pain now 2/10

## 2015-04-24 NOTE — ED Provider Notes (Signed)
CSN: [id number]     Arrival date & time 04/24/15  2156 History   First MD Initiated Contact with Patient 04/24/15 2206     Chief Complaint  Patient presents with  . Chest Pain     (Consider location/radiation/quality/duration/timing/severity/associated sxs/prior Treatment) Patient is a 54 y.o. female presenting with chest pain. The history is provided by the patient.  Chest Pain Pain location:  Substernal area Pain quality: pressure   Pain radiates to:  Does not radiate Pain radiates to the back: no   Pain severity:  Moderate Onset quality:  Gradual Duration:  4 hours Timing:  Constant Progression:  Worsening Chronicity:  New Context: at rest   Relieved by:  Nothing Worsened by:  Nothing tried Ineffective treatments:  None tried Associated symptoms: lower extremity edema (ankle swelling this week), near-syncope and shortness of breath (exertional)   Associated symptoms: no abdominal pain, no cough, no fever and no syncope   Risk factors: hypertension and obesity   Risk factors: no coronary artery disease, no diabetes mellitus and no high cholesterol     Past Medical History  Diagnosis Date  . Chest pain     a. 2009: neg MV  ([facility])  . GERD (gastroesophageal reflux disease)   . Obesity   . PAF (paroxysmal atrial fibrillation)     a. Dx 07/2013;  b. CHA2DS2VASc=1 (female);  c. 07/2013 Echo:  EF 50-55%, no rwma, mod LVH.  Marland Kitchen Hepatic steatosis   . Atrial fibrillation   . DVT (deep vein thrombosis) in pregnancy 1980's  . Pulmonary embolism ~ 1984    "after I had my last child"  . History of blood transfusion     "low count after appy"   . Migraines     "maybe once/2 months" (04/22/2014)  . Anxiety   . Depression   . Buzzing in ear     "right; even after OR"   Past Surgical History  Procedure Laterality Date  . Inner ear surgery Right 2000's  . Appendectomy  1990's  . Tubal ligation  1985   Family History  Problem Relation Age of Onset  . Lung cancer Father    died @ 17, ETOH abuse  . Kidney failure Mother     s/p renal Tx  . Congestive Heart Failure Mother     died @ 44  . Atrial fibrillation Brother     alive @ 80  . Other Brother     died in MVA  . Other Daughter     cardiac arrest  . Cardiomyopathy Daughter   . Other Other     WPW in granddaughter   History  Substance Use Topics  . Smoking status: Former Smoker -- 1.00 packs/day for 8 years    Types: Cigarettes    Quit date: 11/04/1990  . Smokeless tobacco: Never Used  . Alcohol Use: Yes     Comment: ocassionally   OB History    No data available     Review of Systems  Constitutional: Negative for fever.  Respiratory: Positive for shortness of breath (exertional). Negative for cough.   Cardiovascular: Positive for chest pain and near-syncope. Negative for syncope.  Gastrointestinal: Negative for abdominal pain.  All other systems reviewed and are negative.     Allergies  Codeine  Home Medications   Prior to Admission medications   Medication Sig Start Date End Date Taking? Authorizing Provider  amLODipine (NORVASC) 2.5 MG tablet Take 2.5 mg by mouth daily. 02/04/15  Yes Historical Provider,  MD  aspirin EC 81 MG EC tablet Take 1 tablet (81 mg total) by mouth daily. 07/15/13  Yes Ok Anis, NP  buPROPion (WELLBUTRIN XL) 150 MG 24 hr tablet Take 150 mg by mouth daily.   Yes Historical Provider, MD  butalbital-acetaminophen-caffeine (FIORICET, ESGIC) 50-325-40 MG per tablet Take 1 tablet by mouth 2 (two) times daily as needed for headache.   Yes Historical Provider, MD  Cholecalciferol (VITAMIN D) 2000 UNITS tablet Take 2,000 Units by mouth daily.   Yes Historical Provider, MD  ferrous sulfate 325 (65 FE) MG EC tablet Take 325 mg by mouth daily.   Yes Historical Provider, MD  flecainide (TAMBOCOR) 50 MG tablet TAKE 1 TABLET TWICE A DAY 02/06/15  Yes Wendall Stade, MD  ibuprofen (ADVIL,MOTRIN) 200 MG tablet Take 200 mg by mouth every 6 (six) hours as needed.   Yes  Historical Provider, MD  metoprolol tartrate (LOPRESSOR) 25 MG tablet TAKE 1 TABLET TWICE A DAY 02/06/15  Yes Wendall Stade, MD  Multiple Vitamins-Minerals (MULTIVITAMIN WITH MINERALS) tablet Take 1 tablet by mouth daily.   Yes Historical Provider, MD  nitroGLYCERIN (NITROSTAT) 0.4 MG SL tablet Place 1 tablet (0.4 mg total) under the tongue every 5 (five) minutes as needed for chest pain (hold for SBP < 110). 04/24/14  Yes Renae Fickle, MD  omeprazole (PRILOSEC) 20 MG capsule Take 20 mg by mouth daily.    Yes Historical Provider, MD   BP 116/63 mmHg  Pulse 74  Temp(Src) 98.4 F (36.9 C) (Oral)  Resp 21  Ht 5\' 7"  (1.702 m)  Wt 250 lb (113.399 kg)  BMI 39.15 kg/m2  SpO2 100% Physical Exam  Constitutional: She is oriented to person, place, and time. She appears well-developed and well-nourished. No distress.  HENT:  Head: Normocephalic.  Eyes: Conjunctivae are normal.  Neck: Neck supple. No tracheal deviation present.  Cardiovascular: Normal rate, regular rhythm and normal heart sounds.   Pulmonary/Chest: Effort normal and breath sounds normal. No respiratory distress.  Abdominal: Soft. She exhibits no distension. There is no tenderness.  Neurological: She is alert and oriented to person, place, and time.  Skin: Skin is warm and dry.  Psychiatric: She has a normal mood and affect.    ED Course  Procedures (including critical care time) Labs Review Labs Reviewed  CBC - Abnormal; Notable for the following:    HCT 35.8 (*)    All other components within normal limits  BASIC METABOLIC PANEL  I-STAT TROPOININ, ED    Imaging Review Dg Chest Port 1 View  04/24/2015   CLINICAL DATA:  Acute onset of shortness of breath, chest pain and palpitations. Initial encounter.  EXAM: PORTABLE CHEST - 1 VIEW  COMPARISON:  Chest radiograph performed 04/22/2014, and CTA of the chest performed 04/23/2014  FINDINGS: The lungs are well-aerated and clear. The left lung base is not seen on this study.  There is no evidence of focal opacification, pleural effusion or pneumothorax.  The cardiomediastinal silhouette is borderline normal in size. No acute osseous abnormalities are seen.  IMPRESSION: No acute cardiopulmonary process seen.   Electronically Signed   By: Roanna Raider M.D.   On: 04/24/2015 22:54     EKG Interpretation   Date/Time:  Monday April 24 2015 22:07:49 EDT Ventricular Rate:  71 PR Interval:  237 QRS Duration: 108 QT Interval:  427 QTC Calculation: 464 R Axis:   -35 Text Interpretation:  Sinus rhythm Prolonged PR interval Left axis  deviation No  significant change since last tracing Confirmed by YAO  MD,  DAVID ([id number]) on 04/24/2015 10:14:59 PM      MDM   Final diagnoses:  Other chest pain    54 year old female presents with chest pain that started at 6 PM she was at home performing normal activities. She states that there is pressure over the center of her chest that spread to both sides. Initially she was not short of breath became slightly short of breath with activity after the pain started.  It appears the patient had a very similar presentation 1 year ago where she was admitted for chest pain rule out and 3 months later had a second nuclear medicine stress test that was negative and 2 years. She has a cardiologist that she follows with for her paroxysmal A. fib. She states that the pain that she is expressing tonight is different than her typical pain with initiation of atrial fibrillation she is not in fibrillation on ECG or monitoring today. She also states that the pain is different than her prior pulmonary embolism. I feel that she is not demonstrating signs of pulmonary embolism currently, is not tachycardic, is not short of breath at rest or hypoxemic. First troponin is negative. I will discuss the case with cardiology to establish appropriate follow-up but I do not feel that the patient warrants another admission given similarity of symptoms in the past that  resulted in noncardiac etiologies being identified.  Cardiology agrees with delta troponin to rule out ACS acutely and follow-up in clinic routinely.  Lyndal Pulley, MD 04/24/15 2346  Richardean Canal, MD 04/25/15 [phone]

## 2015-04-25 ENCOUNTER — Emergency Department (HOSPITAL_COMMUNITY): Payer: BLUE CROSS/BLUE SHIELD

## 2015-04-25 LAB — I-STAT TROPONIN, ED: Troponin i, poc: 0 ng/mL (ref 0.00–0.08)

## 2015-04-25 MED ORDER — ONDANSETRON 4 MG PO TBDP
8.0000 mg | ORAL_TABLET | Freq: Once | ORAL | Status: AC
  Administered 2015-04-25: 8 mg via ORAL
  Filled 2015-04-25: qty 2

## 2015-04-25 MED ORDER — MECLIZINE HCL 25 MG PO TABS: 25.0000 mg | ORAL_TABLET | Freq: Three times a day (TID) | ORAL | Status: DC | PRN

## 2015-04-25 MED ORDER — MECLIZINE HCL 25 MG PO TABS
25.0000 mg | ORAL_TABLET | Freq: Once | ORAL | Status: AC
  Administered 2015-04-25: 25 mg via ORAL
  Filled 2015-04-25: qty 1

## 2015-04-25 NOTE — ED Notes (Signed)
Assisted patient up to the BR, c/o feeling dizzy when she walked to the door.  This nurse stated with the patient and assisted her back to the room after using the bathroom.

## 2015-04-25 NOTE — ED Provider Notes (Signed)
Care assumed from Drs. Yao and Knotts at shift change. Patient awaiting a second troponin. Second troponin resulted and is again negative. Her EKG is unchanged and I highly doubt that this situation represents a cardiac issue. She began complaining of dizziness that she describes as a spinning sensation and feeling nauseated. For this reason she was given Zofran and meclizine which seemed to improve her symptoms. CT scan of the head is negative. The patient has been observed here for many hours and has remained very stable without any EKG changes or troponin bumps. She will be discharged and advised to follow-up with her cardiologist.  Geoffery Lyons, MD 04/25/15 [phone]

## 2015-04-25 NOTE — ED Notes (Signed)
Patient is alert and orientedx4.  Patient was explained discharge instructions and they understood them with no questions.  The patient's family, Veneda Melter is taking the patient home.

## 2015-04-25 NOTE — Discharge Instructions (Signed)
Meclizine as prescribed as needed for vertigo.  Follow up with your cardiologist this week to be rechecked, and return to the ER if your symptoms significantly worsen or change.   Chest Pain (Nonspecific) It is often hard to give a diagnosis for the cause of chest pain. There is always a chance that your pain could be related to something serious, such as a heart attack or a blood clot in the lungs. You need to follow up with your doctor. HOME CARE  If antibiotic medicine was given, take it as directed by your doctor. Finish the medicine even if you start to feel better.  For the next few days, avoid activities that bring on chest pain. Continue physical activities as told by your doctor.  Do not use any tobacco products. This includes cigarettes, chewing tobacco, and e-cigarettes.  Avoid drinking alcohol.  Only take medicine as told by your doctor.  Follow your doctor's suggestions for more testing if your chest pain does not go away.  Keep all doctor visits you made. GET HELP IF:  Your chest pain does not go away, even after treatment.  You have a rash with blisters on your chest.  You have a fever. GET HELP RIGHT AWAY IF:   You have more pain or pain that spreads to your arm, neck, jaw, back, or belly (abdomen).  You have shortness of breath.  You cough more than usual or cough up blood.  You have very bad back or belly pain.  You feel sick to your stomach (nauseous) or throw up (vomit).  You have very bad weakness.  You pass out (faint).  You have chills. This is an emergency. Do not wait to see if the problems will go away. Call your local emergency services (911 in [place] Do not drive yourself to the hospital. MAKE SURE YOU:   Understand these instructions.  Will watch your condition.  Will get help right away if you are not doing well or get worse. Document Released: 04/08/2008 Document Revised: 10/26/2013 Document Reviewed: 04/08/2008 [facility] Patient  Information 2015 [place], [place]. This information is not intended to replace advice given to you by your health care provider. Make sure you discuss any questions you have with your health care provider.

## 2015-04-25 NOTE — ED Notes (Signed)
ED Doctor at bedside.  

## 2015-04-25 NOTE — ED Notes (Signed)
The patient advised me that she was having chest pain and feeling lightheaded.  Another EKG was done and I advised Dr. Judd Lien of the patient's change in condition.  No orders were given.

## 2015-04-26 ENCOUNTER — Ambulatory Visit (INDEPENDENT_AMBULATORY_CARE_PROVIDER_SITE_OTHER)
Admission: RE | Admit: 2015-04-26 | Discharge: 2015-04-26 | Disposition: A | Discharge status: Home / Self Care | Payer: BLUE CROSS/BLUE SHIELD | Source: Ambulatory Visit | Attending: Nurse Practitioner | Admitting: Nurse Practitioner

## 2015-04-26 ENCOUNTER — Encounter: Payer: Self-pay | Admitting: *Deleted

## 2015-04-26 ENCOUNTER — Telehealth: Payer: Self-pay | Admitting: *Deleted

## 2015-04-26 ENCOUNTER — Ambulatory Visit (INDEPENDENT_AMBULATORY_CARE_PROVIDER_SITE_OTHER): Payer: BLUE CROSS/BLUE SHIELD | Admitting: Nurse Practitioner

## 2015-04-26 ENCOUNTER — Telehealth (HOSPITAL_COMMUNITY): Payer: Self-pay | Admitting: *Deleted

## 2015-04-26 ENCOUNTER — Encounter: Payer: Self-pay | Admitting: Nurse Practitioner

## 2015-04-26 ENCOUNTER — Other Ambulatory Visit: Payer: Self-pay | Admitting: Nurse Practitioner

## 2015-04-26 VITALS — BP 108/74 | HR 66 | Ht 67.0 in | Wt 256.4 lb

## 2015-04-26 DIAGNOSIS — E669 Obesity, unspecified: Secondary | ICD-10-CM

## 2015-04-26 DIAGNOSIS — I480 Paroxysmal atrial fibrillation: Secondary | ICD-10-CM

## 2015-04-26 DIAGNOSIS — R0789 Other chest pain: Secondary | ICD-10-CM | POA: Diagnosis not present

## 2015-04-26 DIAGNOSIS — Z86711 Personal history of pulmonary embolism: Secondary | ICD-10-CM

## 2015-04-26 DIAGNOSIS — K219 Gastro-esophageal reflux disease without esophagitis: Secondary | ICD-10-CM | POA: Diagnosis not present

## 2015-04-26 LAB — D-DIMER, QUANTITATIVE: D-Dimer, Quant: 1.09 ug/mL-FEU — ABNORMAL HIGH (ref 0.00–0.48)

## 2015-04-26 LAB — TROPONIN I: Troponin I: <0.01 ng/mL (ref <0.06)

## 2015-04-26 MED ORDER — IOHEXOL 350 MG/ML SOLN
80.0000 mL | Freq: Once | INTRAVENOUS | Status: AC | PRN
  Administered 2015-04-26: 80 mL via INTRAVENOUS

## 2015-04-26 NOTE — Telephone Encounter (Signed)
S/w pt is aware has appointment today at 3:30 CT for elevated D Dimer.

## 2015-04-26 NOTE — Telephone Encounter (Signed)
Patient given detailed instructions per Myocardial Perfusion Study Information Sheet for test on 05/01/15 at 9:30 am.  Patient Notified to arrive 15 minutes early, and that it is imperative to arrive on time for appointment to keep from having the test rescheduled. Patient verbalized understanding. Antionette Char, RN

## 2015-04-26 NOTE — Addendum Note (Signed)
Addended by: Tonita Phoenix on: 04/26/2015 10:02 AM   Modules accepted: Orders

## 2015-04-26 NOTE — Progress Notes (Signed)
CARDIOLOGY OFFICE NOTE  Date:  04/26/2015    Regina Morales Date of Birth: [birth year] Medical Record #[id number]  PCP:  Lupe Carney, MD  Cardiologist:  Eden Emms    Chief Complaint  Patient presents with  . Chest Pain    Post ER visit - seen for Dr. Eden Emms    History of Present Illness: Regina Morales is a 54 y.o. female who presents today for a post ER visit. Seen for Dr. Eden Emms. She has a history of PAF and previous chest pain. Has had prior PE as well with DVT while pregnant, obesity, GERD and migraines.  She has not had significant coronary artery calcification on CTangio of her chest.   She had a subsequent Myoview which was low risk with fixed apical defect and no ischemia - EF 60% from September of 2015.   Last seen by Dr. Eden Emms back in April - seemed to be doing ok.  ER visit Monday night/Tuesday Am - atypical chest pain - negative evaluation with negative troponin and EKG. Also was lightheaded and dizzy - negative CT of the head - treated with antiemetic and meclizine.  Comes in today. Here with her daughter. Little better with the meclizine. Still with chest pain. Had 2 negative troponins and a negative EKG while in the ER. Her chest pain is described as a pressure - comes and goes - not exertional - took NTG x 1 on Monday ?relief. Says she was told by EMS that she was in AF - I do not see this in EPIC. In sinus today. Dizziness is improved but not resolved. She remains on Meclizine. She remains short of breath. No cough. No hemoptysis. No recent travel by plane or long car rides. Seems pretty sedentary. Says she tries to go to the gym. Has gained weight.   Past Medical History  Diagnosis Date  . Chest pain     a. 2009: neg MV  ([facility])  . GERD (gastroesophageal reflux disease)   . Obesity   . PAF (paroxysmal atrial fibrillation)     a. Dx 07/2013;  b. CHA2DS2VASc=1 (female);  c. 07/2013 Echo:  EF 50-55%, no rwma, mod LVH.  Marland Kitchen Hepatic steatosis   . Atrial  fibrillation   . DVT (deep vein thrombosis) in pregnancy 1980's  . Pulmonary embolism ~ 1984    "after I had my last child"  . History of blood transfusion     "low count after appy"   . Migraines     "maybe once/2 months" (04/22/2014)  . Anxiety   . Depression   . Buzzing in ear     "right; even after OR"    Past Surgical History  Procedure Laterality Date  . Inner ear surgery Right 2000's  . Appendectomy  1990's  . Tubal ligation  1985     Medications: Current Outpatient Prescriptions  Medication Sig Dispense Refill  . amLODipine (NORVASC) 2.5 MG tablet Take 2.5 mg by mouth daily.    Marland Kitchen aspirin EC 81 MG EC tablet Take 1 tablet (81 mg total) by mouth daily.    Marland Kitchen buPROPion (WELLBUTRIN XL) 150 MG 24 hr tablet Take 150 mg by mouth daily.    . butalbital-acetaminophen-caffeine (FIORICET, ESGIC) 50-325-40 MG per tablet Take 1 tablet by mouth 2 (two) times daily as needed for headache.    . Cholecalciferol (VITAMIN D) 2000 UNITS tablet Take 2,000 Units by mouth daily.    . ferrous sulfate 325 (65 FE) MG EC tablet  Take 325 mg by mouth daily.    . flecainide (TAMBOCOR) 50 MG tablet TAKE 1 TABLET TWICE A DAY 180 tablet 0  . ibuprofen (ADVIL,MOTRIN) 200 MG tablet Take 200 mg by mouth every 6 (six) hours as needed.    . meclizine (ANTIVERT) 25 MG tablet Take 1 tablet (25 mg total) by mouth 3 (three) times daily as needed for dizziness. 12 tablet 0  . metoprolol tartrate (LOPRESSOR) 25 MG tablet TAKE 1 TABLET TWICE A DAY 180 tablet 0  . Multiple Vitamins-Minerals (MULTIVITAMIN WITH MINERALS) tablet Take 1 tablet by mouth daily.    . nitroGLYCERIN (NITROSTAT) 0.4 MG SL tablet Place 1 tablet (0.4 mg total) under the tongue every 5 (five) minutes as needed for chest pain (hold for SBP < 110). 30 tablet 0  . omeprazole (PRILOSEC) 20 MG capsule Take 20 mg by mouth daily.      No current facility-administered medications for this visit.    Allergies: Allergies  Allergen Reactions  . Codeine  Nausea And Vomiting    Social History: The patient  reports that she quit smoking about 24 years ago. Her smoking use included Cigarettes. She has a 8 pack-year smoking history. She has never used smokeless tobacco. She reports that she drinks alcohol. She reports that she does not use illicit drugs.   Family History: The patient's family history includes Atrial fibrillation in her brother; Cardiomyopathy in her daughter; Congestive Heart Failure in her mother; Kidney failure in her mother; Lung cancer in her father; Other in her brother, daughter, and other.   Review of Systems: Please see the history of present illness.   Otherwise, the review of systems is positive for chest pain, leg swelling, DOE, back pain, dizziness, and irregular heart beat.   All other systems are reviewed and negative.   Physical Exam: VS:  BP 108/74 mmHg  Pulse 66  Ht 5\' 7"  (1.702 m)  Wt 256 lb 6.4 oz (116.302 kg)  BMI 40.15 kg/m2  SpO2 94% .  BMI Body mass index is 40.15 kg/(m^2).  Wt Readings from Last 3 Encounters:  04/26/15 256 lb 6.4 oz (116.302 kg)  04/24/15 250 lb (113.399 kg)  02/16/15 244 lb 12.8 oz (111.041 kg)    General: Pleasant. Morbidly obese but in no acute distress. She is up 12 pounds since April.  HEENT: Normal. Neck: Supple, no JVD, carotid bruits, or masses noted.  Cardiac: Regular rate and rhythm. No murmurs, rubs, or gallops. No edema.  Respiratory:  Lungs are clear to auscultation bilaterally with normal work of breathing.  GI: Soft and nontender.  MS: No deformity or atrophy. Gait and ROM intact. Skin: Warm and dry. Color is normal.  Neuro:  Strength and sensation are intact and no gross focal deficits noted.  Psych: Alert, appropriate and with normal affect.   LABORATORY DATA:  EKG:  EKG is ordered today. This shows NSR - normal.   Lab Results  Component Value Date   WBC 7.0 04/24/2015   HGB 12.0 04/24/2015   HCT 35.8* 04/24/2015   PLT 261 04/24/2015   GLUCOSE 100*  04/24/2015   CHOL 186 04/23/2014   TRIG 122 04/23/2014   HDL 53 04/23/2014   LDLCALC 109* 04/23/2014   ALT 30 04/22/2014   AST 23 04/22/2014   NA 139 04/24/2015   K 3.4* 04/24/2015   CL 103 04/24/2015   CREATININE 0.69 04/24/2015   BUN 12 04/24/2015   CO2 28 04/24/2015   TSH 1.260 04/22/2014  INR 1.01 07/14/2013   HGBA1C 5.3 04/22/2014    BNP (last 3 results) No results for input(s): BNP in the last 8760 hours.  ProBNP (last 3 results) No results for input(s): PROBNP in the last 8760 hours.  Lab Results  Component Value Date   CKTOTAL 93 04/09/2009   CKMB 0.9 04/09/2009   TROPONINI <0.30 04/23/2014    Other Studies Reviewed Today:  Echo Study Conclusions from 07/2013  - Left ventricle: The cavity size was normal. Wall thickness was increased in a pattern of moderate LVH. Systolic function was normal. The estimated ejection fraction was in the range of 50% to 55%. Wall motion was normal; there were no regional wall motion abnormalities. Left ventricular diastolic function parameters were normal. - Atrial septum: No defect or patent foramen ovale was identified.  Myoview Impression from 07/2014 Exercise Capacity: Fair exercise capacity. BP Response: Hypertensive blood pressure response. Clinical Symptoms: fatigue, dyspnea ECG Impression: No significant ST segment change suggestive of ischemia. Comparison with Prior Nuclear Study: No significant change from previous study  Overall Impression: Low risk stress nuclear study with fixed apical defect. No reversible ischemia.  LV Ejection Fraction: 60%. LV Wall Motion: Normal Wall Motion  Chrystie Nose, MD, [facility] Board Certified in Nuclear Cardiology Attending Cardiologist [facility]   Assessment/Plan: 1. Chest pain - atypical but has risk factors. Will get her stress test repeated. Check d dimer today - she says she really does not remember how she felt with her PE - too long ago. Recheck  troponin today as well. Continue aspirin. EKG is negative.   2. Dizziness - probable vertigo - would continue with Meclizine  3. PAF - she is in sinus today. I do not see documentation of AF   4. Obesity  5. Past history of PE/DVT - checking d dimer. She is not hypoxic nor tachycardic.   Further disposition to follow.   Current medicines are reviewed with the patient today.  The patient does not have concerns regarding medicines other than what has been noted above.  The following changes have been made:  See above.  Labs/ tests ordered today include:    Orders Placed This Encounter  Procedures  . Troponin I  . D-Dimer, Quantitative  . Myocardial Perfusion Imaging  . EKG 12-Lead     Disposition:   FU with Dr. Eden Emms as planned unless stress test is abnormal.    Patient is agreeable to this plan and will call if any problems develop in the interim.   Signed: Rosalio Macadamia, RN, ANP-C 04/26/2015 9:34 AM  [facility] [address] [place], [place]  [address] Phone: [phone] Fax: [phone]

## 2015-04-26 NOTE — Telephone Encounter (Signed)
Call from [place] at [facility] lab-stat D Dimer resulted 1.09. Informed ordering provider.

## 2015-04-26 NOTE — Addendum Note (Signed)
Addended by: Tonita Phoenix on: 04/26/2015 10:01 AM   Modules accepted: Orders

## 2015-04-26 NOTE — Patient Instructions (Addendum)
We will be checking the following labs today - STAT troponin and D Dimer   Medication Instructions:    Continue with your current medicines.     Testing/Procedures To Be Arranged:  Stress Myoview  Follow-Up:   See Dr. Eden Emms as planned  See Dr. Clovis Riley tomorrow as planned    Other Special Instructions:   N/A  Call the [facility] office at [phone] if you have any questions, problems or concerns.

## 2015-04-26 NOTE — Addendum Note (Signed)
Addended by: BOWDEN, ROBIN K on: 04/26/2015 10:02 AM   Modules accepted: Orders  

## 2015-05-01 ENCOUNTER — Ambulatory Visit (HOSPITAL_COMMUNITY): Payer: BLUE CROSS/BLUE SHIELD | Attending: Cardiovascular Disease

## 2015-05-01 DIAGNOSIS — Z86711 Personal history of pulmonary embolism: Secondary | ICD-10-CM

## 2015-05-01 DIAGNOSIS — K219 Gastro-esophageal reflux disease without esophagitis: Secondary | ICD-10-CM

## 2015-05-01 DIAGNOSIS — I480 Paroxysmal atrial fibrillation: Secondary | ICD-10-CM | POA: Diagnosis present

## 2015-05-01 DIAGNOSIS — E669 Obesity, unspecified: Secondary | ICD-10-CM

## 2015-05-01 DIAGNOSIS — R0789 Other chest pain: Secondary | ICD-10-CM

## 2015-05-01 MED ORDER — TECHNETIUM TC 99M SESTAMIBI GENERIC - CARDIOLITE
32.1000 | Freq: Once | INTRAVENOUS | Status: AC | PRN
  Administered 2015-05-01: 32.1 via INTRAVENOUS

## 2015-05-02 ENCOUNTER — Ambulatory Visit (HOSPITAL_COMMUNITY): Payer: BLUE CROSS/BLUE SHIELD

## 2015-05-02 LAB — MYOCARDIAL PERFUSION IMAGING
Estimated workload: 10.1 METS
Exercise duration (min): 8 min
Exercise duration (sec): 31 s
LV dias vol: 124 mL
LV sys vol: 54 mL
MPHR: 168 {beats}/min
Peak HR: 155 {beats}/min
Percent HR: 92 %
RATE: 0.48
RPE: 19
Rest HR: 68 {beats}/min
SDS: 2
SRS: 2
SSS: 4
TID: 0.94

## 2015-05-02 MED ORDER — TECHNETIUM TC 99M SESTAMIBI GENERIC - CARDIOLITE
33.0000 | Freq: Once | INTRAVENOUS | Status: AC | PRN
  Administered 2015-05-02: 33 via INTRAVENOUS

## 2015-05-03 ENCOUNTER — Telehealth: Payer: Self-pay | Admitting: Cardiovascular Disease

## 2015-05-03 NOTE — Telephone Encounter (Signed)
Follow Up ° °Pt returned call//  °

## 2015-05-03 NOTE — Telephone Encounter (Signed)
Called patient back about Regina FredricksonLori Gerhardt NP's recommendations. She would have patient follow up with her primary care doctor for further testing and evaluation. Patient verbalized understanding and will call to make an appointment with her PCP.

## 2015-05-11 ENCOUNTER — Other Ambulatory Visit: Payer: Self-pay | Admitting: Cardiovascular Disease

## 2015-05-12 ENCOUNTER — Other Ambulatory Visit: Payer: Self-pay

## 2015-05-12 MED ORDER — METOPROLOL TARTRATE 25 MG PO TABS: 25.0000 mg | ORAL_TABLET | Freq: Two times a day (BID) | ORAL | Status: DC

## 2015-05-12 MED ORDER — FLECAINIDE ACETATE 50 MG PO TABS: 50.0000 mg | ORAL_TABLET | Freq: Two times a day (BID) | ORAL | Status: DC

## 2015-05-23 ENCOUNTER — Other Ambulatory Visit: Payer: Self-pay

## 2015-05-23 MED ORDER — FLECAINIDE ACETATE 50 MG PO TABS: 50.0000 mg | ORAL_TABLET | Freq: Two times a day (BID) | ORAL | Status: DC

## 2015-05-23 MED ORDER — METOPROLOL TARTRATE 25 MG PO TABS: 25.0000 mg | ORAL_TABLET | Freq: Two times a day (BID) | ORAL | Status: DC

## 2015-06-14 ENCOUNTER — Encounter: Payer: Self-pay | Admitting: Cardiovascular Disease

## 2015-07-06 HISTORY — PX: PLANTAR FASCIA RELEASE: SHX2239

## 2015-12-22 ENCOUNTER — Other Ambulatory Visit (HOSPITAL_COMMUNITY)
Admission: RE | Admit: 2015-12-22 | Discharge: 2015-12-22 | Disposition: A | Discharge status: Home / Self Care | Payer: Managed Care, Other (non HMO) | Source: Ambulatory Visit | Attending: Family Medicine | Admitting: Family Medicine

## 2015-12-22 ENCOUNTER — Other Ambulatory Visit: Payer: Self-pay | Admitting: Family Medicine

## 2015-12-22 DIAGNOSIS — Z124 Encounter for screening for malignant neoplasm of cervix: Secondary | ICD-10-CM | POA: Diagnosis present

## 2015-12-26 LAB — CYTOLOGY - PAP

## 2016-03-05 ENCOUNTER — Encounter: Payer: Self-pay | Admitting: *Deleted

## 2016-03-07 NOTE — Progress Notes (Signed)
Patient ID: Regina Morales, female   DOB: 01/27/1961, 55 y.o.   MRN: [id number]     CARDIOLOGY OFFICE NOTE  Date:  03/07/2016    Regina Morales Date of Birth: 1961/10/04 Medical Record #[id number]  PCP:  Lupe Carney, MD  Cardiologist:  Eden Emms    No chief complaint on file.   History of Present Illness: Regina Morales is a 55 y.o. female  She has a history of PAF and previous chest pain. Has had prior PE as well with DVT while pregnant, obesity, GERD and migraines.  She has not had significant coronary artery calcification on CTangio of her chest.   She had a subsequent Myoview which was low risk with fixed apical defect and no ischemia - EF 60% from September of 2015.   04/24/15 ER Visit  atypical chest pain - negative evaluation with negative troponin and EKG. Also was lightheaded and dizzy - negative CT of the head - treated with antiemetic and meclizine. Subsequent CTA negative for PE    Past Medical History  Diagnosis Date  . Chest pain     a. 2009: neg MV  ([facility])  . GERD (gastroesophageal reflux disease)   . Obesity   . PAF (paroxysmal atrial fibrillation) ([facility])     a. Dx 07/2013;  b. CHA2DS2VASc=1 (female);  c. 07/2013 Echo:  EF 50-55%, no rwma, mod LVH.  Marland Kitchen Hepatic steatosis   . Atrial fibrillation ([facility])   . DVT (deep vein thrombosis) in pregnancy 1980's  . Pulmonary embolism ([facility]) ~ 1984    "after I had my last child"  . History of blood transfusion     "low count after appy"   . Migraines     "maybe once/2 months" (04/22/2014)  . Anxiety   . Depression   . Buzzing in ear     "right; even after OR"    Past Surgical History  Procedure Laterality Date  . Inner ear surgery Right 2000's  . Appendectomy  1990's  . Tubal ligation  1985     Medications: Current Outpatient Prescriptions  Medication Sig Dispense Refill  . amLODipine (NORVASC) 2.5 MG tablet Take 2.5 mg by mouth daily.    Marland Kitchen aspirin EC 81 MG EC tablet Take 1 tablet (81 mg total) by mouth  daily.    Marland Kitchen buPROPion (WELLBUTRIN XL) 150 MG 24 hr tablet Take 150 mg by mouth daily.    . butalbital-acetaminophen-caffeine (FIORICET, ESGIC) 50-325-40 MG per tablet Take 1 tablet by mouth 2 (two) times daily as needed for headache.    . Cholecalciferol (VITAMIN D) 2000 UNITS tablet Take 2,000 Units by mouth daily.    . ferrous sulfate 325 (65 FE) MG EC tablet Take 325 mg by mouth daily.    . flecainide (TAMBOCOR) 50 MG tablet Take 1 tablet (50 mg total) by mouth 2 (two) times daily. 180 tablet 3  . ibuprofen (ADVIL,MOTRIN) 200 MG tablet Take 200 mg by mouth every 6 (six) hours as needed.    . meclizine (ANTIVERT) 25 MG tablet Take 1 tablet (25 mg total) by mouth 3 (three) times daily as needed for dizziness. 12 tablet 0  . metoprolol tartrate (LOPRESSOR) 25 MG tablet Take 1 tablet (25 mg total) by mouth 2 (two) times daily. 180 tablet 3  . Multiple Vitamins-Minerals (MULTIVITAMIN WITH MINERALS) tablet Take 1 tablet by mouth daily.    . nitroGLYCERIN (NITROSTAT) 0.4 MG SL tablet Place 1 tablet (0.4 mg total) under the tongue every 5 (five)  minutes as needed for chest pain (hold for SBP < 110). 30 tablet 0  . omeprazole (PRILOSEC) 20 MG capsule Take 20 mg by mouth daily.      No current facility-administered medications for this visit.    Allergies: Allergies  Allergen Reactions  . Codeine Nausea And Vomiting    Social History: The patient  reports that she quit smoking about 25 years ago. Her smoking use included Cigarettes. She has a 8 pack-year smoking history. She has never used smokeless tobacco. She reports that she drinks alcohol. She reports that she does not use illicit drugs.   Family History: The patient's family history includes Alcohol abuse in her father; Atrial fibrillation in her brother; Cardiomyopathy in her daughter; Congestive Heart Failure in her mother; Kidney failure in her mother; Lung cancer in her father; Sudden Cardiac Death in her daughter; Wolff Parkinson White  syndrome in her grandchild.   Review of Systems: Please see the history of present illness.   Otherwise, the review of systems is positive for chest pain, leg swelling, DOE, back pain, dizziness, and irregular heart beat.   All other systems are reviewed and negative.   Physical Exam: VS:  There were no vitals taken for this visit. Marland Kitchen  BMI There is no weight on file to calculate BMI.  Wt Readings from Last 3 Encounters:  05/01/15 116.121 kg (256 lb)  04/26/15 116.302 kg (256 lb 6.4 oz)  04/24/15 113.399 kg (250 lb)    General: Pleasant. Morbidly obese but in no acute distress. She is up 12 pounds since April.  HEENT: Normal. Neck: Supple, no JVD, carotid bruits, or masses noted.  Cardiac: Regular rate and rhythm. No murmurs, rubs, or gallops. No edema.  Respiratory:  Lungs are clear to auscultation bilaterally with normal work of breathing.  GI: Soft and nontender.  MS: No deformity or atrophy. Gait and ROM intact. Skin: Warm and dry. Color is normal.  Neuro:  Strength and sensation are intact and no gross focal deficits noted.  Psych: Alert, appropriate and with normal affect.   LABORATORY DATA:  EKG:  Marland Kitchen This shows NSR - normal. Except PR 220 msec rate 71   Lab Results  Component Value Date   WBC 7.0 04/24/2015   HGB 12.0 04/24/2015   HCT 35.8* 04/24/2015   PLT 261 04/24/2015   GLUCOSE 100* 04/24/2015   CHOL 186 04/23/2014   TRIG 122 04/23/2014   HDL 53 04/23/2014   LDLCALC 109* 04/23/2014   ALT 30 04/22/2014   AST 23 04/22/2014   NA 139 04/24/2015   K 3.4* 04/24/2015   CL 103 04/24/2015   CREATININE 0.69 04/24/2015   BUN 12 04/24/2015   CO2 28 04/24/2015   TSH 1.260 04/22/2014   INR 1.01 07/14/2013   HGBA1C 5.3 04/22/2014    BNP (last 3 results) No results for input(s): BNP in the last 8760 hours.  ProBNP (last 3 results) No results for input(s): PROBNP in the last 8760 hours.  Lab Results  Component Value Date   CKTOTAL 93 04/09/2009   CKMB 0.9  04/09/2009   TROPONINI <0.01 04/26/2015    Other Studies Reviewed Today:  Echo Study Conclusions from 07/2013  - Left ventricle: The cavity size was normal. Wall thickness was increased in a pattern of moderate LVH. Systolic function was normal. The estimated ejection fraction was in the range of 50% to 55%. Wall motion was normal; there were no regional wall motion abnormalities. Left ventricular diastolic function parameters  were normal. - Atrial septum: No defect or patent foramen ovale was identified.  Myoview Impression from 07/2014 Exercise Capacity: Fair exercise capacity. BP Response: Hypertensive blood pressure response. Clinical Symptoms: fatigue, dyspnea ECG Impression: No significant ST segment change suggestive of ischemia. Comparison with Prior Nuclear Study: No significant change from previous study  Overall Impression: Low risk stress nuclear study with fixed apical defect. No reversible ischemia.  LV Ejection Fraction: 60%. LV Wall Motion: Normal Wall Motion  Chrystie NoseKenneth C. Hilty, MD, [facility] Hospital[facility]ACC Board Certified in Nuclear Cardiology Attending Cardiologist [facility]   Assessment/Plan: 1. Chest pain - resolved normal stress test  myovue 04/2015 negative CTA for recurrent PE  2. Dizziness - probable vertigo - would continue with PRN  Meclizine  3. PAF - she is in sinus today. I do not see documentation of AF continue flecainide. ECG normal today   4. Obesity discussed low carb diet and exercise   5. Past history of PE/DVT:  June 2016 CT negative for any recurrence     Charlton Hawseter Keyaria Lawson

## 2016-03-08 ENCOUNTER — Encounter: Payer: Self-pay | Admitting: Cardiovascular Disease

## 2016-03-08 ENCOUNTER — Ambulatory Visit (INDEPENDENT_AMBULATORY_CARE_PROVIDER_SITE_OTHER): Payer: Managed Care, Other (non HMO) | Admitting: Cardiovascular Disease

## 2016-03-08 VITALS — BP 104/70 | HR 73 | Ht 67.0 in | Wt 295.8 lb

## 2016-03-08 DIAGNOSIS — I480 Paroxysmal atrial fibrillation: Secondary | ICD-10-CM | POA: Diagnosis not present

## 2016-03-08 NOTE — Patient Instructions (Signed)

## 2016-04-17 ENCOUNTER — Other Ambulatory Visit: Payer: Self-pay | Admitting: Family Medicine

## 2016-04-17 ENCOUNTER — Ambulatory Visit
Admission: RE | Admit: 2016-04-17 | Discharge: 2016-04-17 | Disposition: A | Discharge status: Home / Self Care | Payer: Managed Care, Other (non HMO) | Source: Ambulatory Visit | Attending: Family Medicine | Admitting: Family Medicine

## 2016-04-17 DIAGNOSIS — J209 Acute bronchitis, unspecified: Secondary | ICD-10-CM

## 2016-07-18 ENCOUNTER — Other Ambulatory Visit: Payer: Self-pay | Admitting: Cardiovascular Disease

## 2016-11-04 DIAGNOSIS — J449 Chronic obstructive pulmonary disease, unspecified: Secondary | ICD-10-CM

## 2016-11-04 HISTORY — DX: Chronic obstructive pulmonary disease, unspecified: J44.9

## 2017-03-03 ENCOUNTER — Other Ambulatory Visit: Payer: Self-pay | Admitting: Obstetrics & Gynecology

## 2017-03-12 NOTE — Progress Notes (Signed)
Patient ID: Regina Morales, female   DOB: 10/22/1961, 56 y.o.   MRN: [id number]     CARDIOLOGY OFFICE NOTE  Date:  03/17/2017    Regina Morales Date of Birth: 08/01/1961 Medical Record #[id number]  PCP:  Clovis RileyMitchell, L.August Saucerean, MD  Cardiologist:  Eden EmmsNishan    Chief Complaint  Patient presents with  . Atrial Fibrillation    History of Present Illness: Tylasia A Morales is a 56 y.o. female  She has a history of PAF and previous chest pain. Has had prior PE as well with DVT while pregnant, obesity, GERD and migraines.  She has not had significant coronary artery calcification on CTangio of her chest.   She had a subsequent Myoview which was low risk with fixed apical defect and no ischemia - EF 60% from September of 2015.   04/24/15 ER Visit  atypical chest pain - negative evaluation with negative troponin and EKG. Also was lightheaded and dizzy - negative CT of the head - treated with antiemetic and meclizine. Subsequent CTA negative for PE   On imiprimine for sleep has uterine polyps and may need surgery . From cardiac perspective she is ok to proceed Had sleep study with OSA and will need CPAP   Past Medical History:  Diagnosis Date  . Anxiety   . Atrial fibrillation (HCC)   . Buzzing in ear    "right; even after OR"  . Chest pain    a. 2009: neg MV  ([facility])  . Depression   . DVT (deep vein thrombosis) in pregnancy (HCC) 1980's  . GERD (gastroesophageal reflux disease)   . Hepatic steatosis   . History of blood transfusion    "low count after appy"   . Migraines    "maybe once/2 months" (04/22/2014)  . Obesity   . PAF (paroxysmal atrial fibrillation) (HCC)    a. Dx 07/2013;  b. CHA2DS2VASc=1 (female);  c. 07/2013 Echo:  EF 50-55%, no rwma, mod LVH.  . Pulmonary embolism (HCC) ~ 1984   "after I had my last child"    Past Surgical History:  Procedure Laterality Date  . APPENDECTOMY  1990's  . INNER EAR SURGERY Right 2000's  . TUBAL LIGATION  1985      Medications: Current Outpatient Prescriptions  Medication Sig Dispense Refill  . amLODipine (NORVASC) 2.5 MG tablet Take 2.5 mg by mouth daily.    Marland Kitchen. aspirin EC 81 MG EC tablet Take 1 tablet (81 mg total) by mouth daily.    Marland Kitchen. atorvastatin (LIPITOR) 20 MG tablet Take 20 mg by mouth daily.    Marland Kitchen. buPROPion (WELLBUTRIN XL) 300 MG 24 hr tablet Take 300 mg by mouth daily.    . Cholecalciferol (VITAMIN D) 2000 UNITS tablet Take 2,000 Units by mouth daily.    . ferrous sulfate 325 (65 FE) MG EC tablet Take 325 mg by mouth daily.    . flecainide (TAMBOCOR) 50 MG tablet Take 1 tablet (50 mg total) by mouth 2 (two) times daily. 180 tablet 2  . imipramine (TOFRANIL) 25 MG tablet Take 20 mg by mouth daily.    . metoprolol tartrate (LOPRESSOR) 25 MG tablet Take 1 tablet (25 mg total) by mouth 2 (two) times daily. 180 tablet 2  . Multiple Vitamins-Minerals (MULTIVITAMIN WITH MINERALS) tablet Take 1 tablet by mouth daily.    . nitroGLYCERIN (NITROSTAT) 0.4 MG SL tablet Place 1 tablet (0.4 mg total) under the tongue every 5 (five) minutes as needed for chest pain (hold for SBP <  110). 30 tablet 0  . pantoprazole (PROTONIX) 40 MG tablet Take 40 mg by mouth daily.     No current facility-administered medications for this visit.     Allergies: Allergies  Allergen Reactions  . Codeine Nausea And Vomiting    Social History: The patient  reports that she quit smoking about 26 years ago. Her smoking use included Cigarettes. She has a 8.00 pack-year smoking history. She has never used smokeless tobacco. She reports that she drinks alcohol. She reports that she does not use drugs.   Family History: The patient's family history includes Alcohol abuse in her father; Atrial fibrillation in her brother; Cardiomyopathy in her daughter; Congestive Heart Failure in her mother; Kidney failure in her mother; Lung cancer in her father; Sudden Cardiac Death in her daughter; Wolff Parkinson White syndrome in her  grandchild.   Review of Systems: Please see the history of present illness.   Otherwise, the review of systems is positive for chest pain, leg swelling, DOE, back pain, dizziness, and irregular heart beat.   All other systems are reviewed and negative.   Physical Exam: VS:  BP 140/80   Pulse 94   Ht 5\' 7"  (1.702 m)   Wt (!) 302 lb 12.8 oz (137.3 kg)   SpO2 98%   BMI 47.43 kg/m  .  BMI Body mass index is 47.43 kg/m.  Wt Readings from Last 3 Encounters:  03/17/17 (!) 302 lb 12.8 oz (137.3 kg)  03/08/16 295 lb 12.8 oz (134.2 kg)  05/01/15 256 lb (116.1 kg)    BP 140/80   Pulse 94   Ht 5\' 7"  (1.702 m)   Wt (!) 302 lb 12.8 oz (137.3 kg)   SpO2 98%   BMI 47.43 kg/m  Affect appropriate Healthy:  appears stated age HEENT: normal Neck supple with no adenopathy JVP normal no bruits no thyromegaly Lungs clear with no wheezing and good diaphragmatic motion Heart:  S1/S2 no murmur, no rub, gallop or click PMI normal Abdomen: benighn, BS positve, no tenderness, no AAA no bruit.  No HSM or HJR Distal pulses intact with no bruits No edema Neuro non-focal Skin warm and dry No muscular weakness    LABORATORY DATA:  EKG:  . 03/08/16  NSR - normal. Except PR 220 msec rate 71  03/17/17  SR rate 83 normal   Lab Results  Component Value Date   WBC 7.0 04/24/2015   HGB 12.0 04/24/2015   HCT 35.8 (L) 04/24/2015   PLT 261 04/24/2015   GLUCOSE 100 (H) 04/24/2015   CHOL 186 04/23/2014   TRIG 122 04/23/2014   HDL 53 04/23/2014   LDLCALC 109 (H) 04/23/2014   ALT 30 04/22/2014   AST 23 04/22/2014   NA 139 04/24/2015   K 3.4 (L) 04/24/2015   CL 103 04/24/2015   CREATININE 0.69 04/24/2015   BUN 12 04/24/2015   CO2 28 04/24/2015   TSH 1.260 04/22/2014   INR 1.01 07/14/2013   HGBA1C 5.3 04/22/2014    BNP (last 3 results) No results for input(s): BNP in the last 8760 hours.  ProBNP (last 3 results) No results for input(s): PROBNP in the last 8760 hours.  Lab Results   Component Value Date   CKTOTAL 93 04/09/2009   CKMB 0.9 04/09/2009   TROPONINI <0.01 04/26/2015    Other Studies Reviewed Today:  Echo Study Conclusions from 07/2013  - Left ventricle: The cavity size was normal. Wall thickness was increased in a pattern of moderate  LVH. Systolic function was normal. The estimated ejection fraction was in the range of 50% to 55%. Wall motion was normal; there were no regional wall motion abnormalities. Left ventricular diastolic function parameters were normal. - Atrial septum: No defect or patent foramen ovale was identified.  Myoview Impression from 07/2014 Exercise Capacity: Fair exercise capacity. BP Response: Hypertensive blood pressure response. Clinical Symptoms: fatigue, dyspnea ECG Impression: No significant ST segment change suggestive of ischemia. Comparison with Prior Nuclear Study: No significant change from previous study  Overall Impression: Low risk stress nuclear study with fixed apical defect. No reversible ischemia.  LV Ejection Fraction: 60%. LV Wall Motion: Normal Wall Motion  Chrystie Nose, MD, [facility] Board Certified in Nuclear Cardiology Attending Cardiologist [facility]   Assessment/Plan: 1. Chest pain - resolved normal stress test  myovue 04/2015 negative CTA for recurrent PE  2. Dizziness - probable vertigo - would continue with PRN  Meclizine  3. PAF - she is in sinus today. I do not see documentation of AF continue flecainide. ECG normal today   4. Obesity discussed low carb diet and exercise   5. Past history of PE/DVT:  June 2016 CT negative for any recurrence   6. First Degree Block:  Stable PR 204 msec on ECG today  7. Preop:  Ok to have OB/GYN surgery for uterine polyps small risk of PAF   8. OSA:  F/u with primary for CPAP titration    [name]

## 2017-03-17 ENCOUNTER — Ambulatory Visit (INDEPENDENT_AMBULATORY_CARE_PROVIDER_SITE_OTHER): Payer: Managed Care, Other (non HMO) | Admitting: Cardiovascular Disease

## 2017-03-17 ENCOUNTER — Encounter: Payer: Self-pay | Admitting: Cardiovascular Disease

## 2017-03-17 VITALS — BP 140/80 | HR 94 | Ht 67.0 in | Wt 302.8 lb

## 2017-03-17 DIAGNOSIS — I480 Paroxysmal atrial fibrillation: Secondary | ICD-10-CM

## 2017-03-17 NOTE — Patient Instructions (Signed)

## 2017-03-27 ENCOUNTER — Other Ambulatory Visit: Payer: Self-pay | Admitting: Cardiovascular Disease

## 2017-04-08 ENCOUNTER — Telehealth: Payer: Self-pay

## 2017-04-08 NOTE — Telephone Encounter (Signed)
Received fax for clearance will send last office visit note to Dr. Charlotta Newtonzan office that states:  " Preop:  Ok to have OB/GYN surgery for uterine polyps small risk of PAF " per Dr. Eden EmmsNishan.    Patient is having hysteroscopy/D&C/Polypectomy on 04/14/2017 with Dr. Myna HidalgoJennifer Ozan. Will need to fax clearance to [phone] Attn: Myrene.

## 2017-04-08 NOTE — Patient Instructions (Addendum)
Your procedure is scheduled on:  Monday, June 11  [facility] at: 2 PM   Pick up the phone at the desk and dial [phone].  Call this number if you have problems the morning of surgery: [phone].  Remember:  Before 7:30 am Monday, day of surgery, you may only have dry toast for breakfast.   Do NOT drink clear liquids after  9:30 am Monday, day of surgery  Take these medicines the morning of surgery with a SIP OF WATER:  Lipitor, tambocar, metoprolol  Do NOT wear jewelry (body piercing), metal hair clips/bobby pins, make-up, or nail polish. Do NOT wear lotions, powders, or perfumes.  You may wear deoderant. Do NOT shave for 48 hours prior to surgery. Do NOT bring valuables to the hospital. Dentures may not be worn into surgery.  Have a responsible adult drive you home and stay with you for 24 hours after your procedure.  Home with [facility] [place] [place] cell [phone].

## 2017-04-09 ENCOUNTER — Encounter (HOSPITAL_COMMUNITY): Payer: Self-pay

## 2017-04-09 ENCOUNTER — Encounter (HOSPITAL_COMMUNITY)
Admission: RE | Admit: 2017-04-09 | Discharge: 2017-04-09 | Disposition: A | Discharge status: Home / Self Care | Payer: Managed Care, Other (non HMO) | Source: Ambulatory Visit | Attending: Obstetrics & Gynecology | Admitting: Obstetrics & Gynecology

## 2017-04-09 DIAGNOSIS — Z01812 Encounter for preprocedural laboratory examination: Secondary | ICD-10-CM | POA: Diagnosis present

## 2017-04-09 HISTORY — DX: Anemia, unspecified: D64.9

## 2017-04-09 HISTORY — DX: Sleep apnea, unspecified: G47.30

## 2017-04-09 LAB — BASIC METABOLIC PANEL
Anion gap: 8 (ref 5–15)
BUN: 17 mg/dL (ref 6–20)
CALCIUM: 9.2 mg/dL (ref 8.9–10.3)
CO2: 26 mmol/L (ref 22–32)
CREATININE: 0.77 mg/dL (ref 0.44–1.00)
Chloride: 107 mmol/L (ref 101–111)
GFR calc non Af Amer: >60 mL/min (ref >60)
Glucose, Bld: 105 mg/dL — ABNORMAL HIGH (ref 65–99)
Potassium: 3.9 mmol/L (ref 3.5–5.1)
SODIUM: 141 mmol/L (ref 135–145)

## 2017-04-09 LAB — CBC
HCT: 35.7 % — ABNORMAL LOW (ref 36.0–46.0)
Hemoglobin: 11.6 g/dL — ABNORMAL LOW (ref 12.0–15.0)
MCH: 28.4 pg (ref 26.0–34.0)
MCHC: 32.5 g/dL (ref 30.0–36.0)
MCV: 87.3 fL (ref 78.0–100.0)
PLATELETS: 294 10*3/uL (ref 150–400)
RBC: 4.09 MIL/uL (ref 3.87–5.11)
RDW: 13.5 % (ref 11.5–15.5)
WBC: 5.4 10*3/uL (ref 4.0–10.5)

## 2017-04-09 NOTE — Pre-Procedure Instructions (Signed)
EKG and cleared for 04/14/17 surgery by Dr. Charlton HawsPeter Morales.  Placed on patient's chart.  [place] (1989[place] - SDS BB HIstory Log given to lab.

## 2017-04-12 NOTE — H&P (Signed)
56yo PM female who presents for hysteroscopy, D&C and possible myosure polypectomy due to uterine polyp and bleeding.  In review, the bleeding started about 2 years ago- initially it was just light spotting and when she mentioned something to Dr. Clovis Riley- pap was done and was benign. This year, the bleeding has gotten much worse. Initially it was maybe once a month, now it is 2-3x per week. Bleeding is dark red and more like "period-blood" and requires her to wear pads daily. Some dysmenorrhea as well as bloating. Since her last visit- she reports no change and is still have bleeding several days throughout the week. When she has the bleeding, it usually requires 2-3 pads per day.  [place] reviewed: 7cm retroverted uterus- normal size and shape. Follwoing saline infusion- 2.7x2.5x1.6 fundal mass with irregular borders, avascular. Normal ovaries bilaterally.   Current Medication:  Taking  Flecainide Acetate 50 MG Tablet 1 tablet Orally twice a day     Butalbital-Acetaminophen . Tablet 1 tablet Orally as needed for migraines, Notes: as needed     NitroQuick(Nitroglycerin) 0.4 MG Tablet Sublingual Sublingual , Notes: as needed     Ibuprofen 200 MG Tablet 2 tablet as needed Orally Three times a day     Vitamin D 2000 UNIT Tablet Orally     Aspirin 81 MG Tablet 1 tablet Orally Once a day     Iron Supplement(Iron) 325 (65 Fe) MG Tablet 1 tablet Orally Once a day     Multi For Her Tablet 1 tablet Orally daily     Metoprolol Tartrate 25 MG Tablet 1 tablet Orally Twice a day     Atorvastatin Calcium 20 MG Tablet 1 tablet Orally Once a day for cholesterol     Amlodipine Besylate 2.5 MG Tablet 1 tablet Orally Once a day     Imipramine HCl 25 MG Tablet 1 tablet Orally Two hours before bedtime     BuPROPion HCl ER (XL) 300 MG Tablet Extended Release 24 Hour 1 tablet in the morning Orally Once a day     Medication List reviewed and reconciled with the patient    Medical History:   depression/  anxiety     migraines     atrial fibrillation - Dr. Eden Emms     rectal bleeding     blood transfusion     OSA- (HST 03/2017 ESS 19 ; AHI-9.6/hr; O2 min-81%)-needs f/u ONO after stable on APAP      Allergies/Intolerance:   Codeine (for allergy) - stomach upset,dizzy     Darvon - stomach upset,dizzy   Gyn History:   Sexual activity not currently sexually active.  Periods : postmenopausal.  LMP 2010.  Denies Birth control.  Last pap smear date 12/22/2015 Normal .  Last mammogram date 10/2015.  Denies Abnormal pap smear.  Denies STD.   OB History:   Pregnancy # 1 live birth, vaginal delivery.  Pregnancy # 2 live birth, vaginal delivery.  Pregnancy # 3 miscarriage.  Pregnancy # 4: live birth, vaginal delivery.  Pregnancy # 5: Live Birth, vaginal delivery.   Surgical History:   appendectomy     right ear surgery     colon 12/14/13     Tendon release R foot - Dr Lajoyce Corners 07/2015   Hospitalization:   chest pain 08/2008     afib 07/15/13   Family History:   Father: deceased, diagnosed with Lung Cancer, Cirrhosis    Mother: deceased, kidney failure, diagnosed with Coronary artery disease, CHF (congestive heart failure)  Brother 1: alive    Brother2: alive    Brother 3: deceased    Sister 1: alive    Sister 2: alive    Sister 3: alive    3 brother(s) , 3 sister(s) - healthy.    neg for colon cancer or polyps, denies any GYN family cancer hx.  Social History:  General Tobacco use  cigarettes: Former smoker  Quit in year 1991  Pack-year Hx: 15  Tobacco history last updated 04/03/2017  no EXPOSURE TO PASSIVE SMOKE.  no Alcohol.  no Caffeine.  no Recreational drug use.  DIET: low fat.  no Exercise.  Marital Status: Separated.  Children: 4.  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  Seat belt use: yes.   Review of Systems CONSTITUTIONAL:  no Chills. Fatigue yes. no Fever. no Night sweats. Weight gain yes.  HEENT:  Blurrred vision no. no Double vision.  CARDIOLOGY:   no Chest pain. Irregular heart beat yes, followed by cardiology.  RESPIRATORY:  no Shortness of breath. no Cough.  UROLOGY:  no Urinary frequency. no Urinary incontinence. no Urinary urgency.  GASTROENTEROLOGY:  Abdominal pain yes. Appetite change yes, notes early satiety. Bloating/belching yes. Clay colored stools yes.  FEMALE REPRODUCTIVE:  no Breast lumps or discharge. no Breast pain.  NEUROLOGY:  no Dizziness. Headache yes. no Loss of consciousness.  PSYCHOLOGY:  no Anxiety. Depression yes.  SKIN:  no Rash. no Hives.  HEMATOLOGY/LYMPH:  no Anemia. no Fatigue. Using Blood Thinners yes  O: Examination performed in office: Examination General Examination: GENERAL APPEARANCE well developed, well nourished .  SKIN: warm and dry, no rashes .  NECK: supple, normal appearance .  LUNGS: regular breathing rate and effort .  FEMALE GENITOURINARY: normal external genitalia, labia - unremarkable, vagina - pink moist mucosa, no lesions or abnormal discharge, cervix - no discharge or lesions or CMT, uterus - nontender and normal size on palpation.  MUSCULOSKELETAL no calf tenderness bilaterally .  EXTREMITIES: no edema present .  PSYCH: appropriate mood and affect  CBC    Component Value Date/Time   WBC 5.4 04/09/2017 0930   RBC 4.09 04/09/2017 0930   HGB 11.6 (L) 04/09/2017 0930   HCT 35.7 (L) 04/09/2017 0930   PLT 294 04/09/2017 0930   MCV 87.3 04/09/2017 0930   MCH 28.4 04/09/2017 0930   MCHC 32.5 04/09/2017 0930   RDW 13.5 04/09/2017 0930   LYMPHSABS 1.3 07/14/2013 1545   MONOABS 0.5 07/14/2013 1545   EOSABS 0.1 07/14/2013 1545   BASOSABS 0.0 07/14/2013 1545   A/P: 56yo PM female who presents hysteroscopy, D&C and myosure polypectomy due to uterine polyp and bleeding -NPO -LR @ 125cc/hr -SCDs to OR -no antibiotics indicated -pt has been cleared by cardiology and PCP -risk/benefit and potential complications reviewed including risk of bleeding, infection and uterine  perforation.  Pt aware and wishes to proceed with surgery  Myna HidalgoJennifer Khyson Sebesta, DO [phone] (pager) [phone] (office)

## 2017-04-14 ENCOUNTER — Ambulatory Visit (HOSPITAL_COMMUNITY): Payer: Managed Care, Other (non HMO) | Admitting: Anesthesiology

## 2017-04-14 ENCOUNTER — Encounter (HOSPITAL_COMMUNITY): Payer: Self-pay | Admitting: *Deleted

## 2017-04-14 ENCOUNTER — Encounter (HOSPITAL_COMMUNITY)
Admission: RE | Disposition: A | Discharge status: Home / Self Care | Payer: Self-pay | Source: Ambulatory Visit | Attending: Cardiovascular Disease

## 2017-04-14 ENCOUNTER — Observation Stay (HOSPITAL_COMMUNITY)
Admission: RE | Admit: 2017-04-14 | Discharge: 2017-04-17 | Disposition: A | Discharge status: Home / Self Care | Payer: Managed Care, Other (non HMO) | Source: Ambulatory Visit | Attending: Cardiovascular Disease | Admitting: Cardiovascular Disease

## 2017-04-14 DIAGNOSIS — G4730 Sleep apnea, unspecified: Secondary | ICD-10-CM | POA: Diagnosis not present

## 2017-04-14 DIAGNOSIS — N950 Postmenopausal bleeding: Principal | ICD-10-CM | POA: Insufficient documentation

## 2017-04-14 DIAGNOSIS — I481 Persistent atrial fibrillation: Secondary | ICD-10-CM | POA: Diagnosis not present

## 2017-04-14 DIAGNOSIS — G43909 Migraine, unspecified, not intractable, without status migrainosus: Secondary | ICD-10-CM | POA: Insufficient documentation

## 2017-04-14 DIAGNOSIS — Z6841 Body Mass Index (BMI) 40.0 and over, adult: Secondary | ICD-10-CM | POA: Insufficient documentation

## 2017-04-14 DIAGNOSIS — R079 Chest pain, unspecified: Secondary | ICD-10-CM | POA: Diagnosis not present

## 2017-04-14 DIAGNOSIS — R9439 Abnormal result of other cardiovascular function study: Secondary | ICD-10-CM | POA: Diagnosis not present

## 2017-04-14 DIAGNOSIS — Z885 Allergy status to narcotic agent status: Secondary | ICD-10-CM | POA: Insufficient documentation

## 2017-04-14 DIAGNOSIS — R0789 Other chest pain: Secondary | ICD-10-CM | POA: Insufficient documentation

## 2017-04-14 DIAGNOSIS — Z7982 Long term (current) use of aspirin: Secondary | ICD-10-CM | POA: Insufficient documentation

## 2017-04-14 DIAGNOSIS — I10 Essential (primary) hypertension: Secondary | ICD-10-CM | POA: Diagnosis not present

## 2017-04-14 DIAGNOSIS — Z79899 Other long term (current) drug therapy: Secondary | ICD-10-CM | POA: Insufficient documentation

## 2017-04-14 DIAGNOSIS — N840 Polyp of corpus uteri: Secondary | ICD-10-CM

## 2017-04-14 DIAGNOSIS — G4733 Obstructive sleep apnea (adult) (pediatric): Secondary | ICD-10-CM | POA: Diagnosis not present

## 2017-04-14 DIAGNOSIS — I480 Paroxysmal atrial fibrillation: Secondary | ICD-10-CM | POA: Diagnosis present

## 2017-04-14 DIAGNOSIS — D649 Anemia, unspecified: Secondary | ICD-10-CM | POA: Diagnosis not present

## 2017-04-14 DIAGNOSIS — I4819 Other persistent atrial fibrillation: Secondary | ICD-10-CM

## 2017-04-14 DIAGNOSIS — F329 Major depressive disorder, single episode, unspecified: Secondary | ICD-10-CM | POA: Insufficient documentation

## 2017-04-14 DIAGNOSIS — Z87891 Personal history of nicotine dependence: Secondary | ICD-10-CM | POA: Diagnosis not present

## 2017-04-14 HISTORY — DX: Essential (primary) hypertension: I10

## 2017-04-14 HISTORY — DX: Dependence on other enabling machines and devices: Z99.89

## 2017-04-14 HISTORY — DX: Other persistent atrial fibrillation: I48.19

## 2017-04-14 HISTORY — PX: DILATATION & CURETTAGE/HYSTEROSCOPY WITH MYOSURE: SHX6511

## 2017-04-14 HISTORY — DX: Fibromyalgia: M79.7

## 2017-04-14 HISTORY — DX: Obstructive sleep apnea (adult) (pediatric): G47.33

## 2017-04-14 HISTORY — DX: Pure hypercholesterolemia, unspecified: E78.00

## 2017-04-14 LAB — CBC
HEMATOCRIT: 33.3 % — AB (ref 36.0–46.0)
HEMOGLOBIN: 10.9 g/dL — AB (ref 12.0–15.0)
MCH: 28.5 pg (ref 26.0–34.0)
MCHC: 32.7 g/dL (ref 30.0–36.0)
MCV: 86.9 fL (ref 78.0–100.0)
PLATELETS: 255 10*3/uL (ref 150–400)
RBC: 3.83 MIL/uL — ABNORMAL LOW (ref 3.87–5.11)
RDW: 13.7 % (ref 11.5–15.5)
WBC: 8.2 10*3/uL (ref 4.0–10.5)

## 2017-04-14 LAB — CK TOTAL AND CKMB (NOT AT ARMC)
CK, MB: 4 ng/mL (ref 0.5–5.0)
RELATIVE INDEX: 1.7 (ref 0.0–2.5)
Total CK: 231 U/L (ref 38–234)

## 2017-04-14 LAB — TROPONIN I: Troponin I: <0.03 ng/mL (ref <0.03)

## 2017-04-14 SURGERY — DILATATION & CURETTAGE/HYSTEROSCOPY WITH MYOSURE
Anesthesia: General | Site: Vagina

## 2017-04-14 MED ORDER — LACTATED RINGERS IV SOLN
INTRAVENOUS | Status: DC
  Administered 2017-04-14: 14:00:00 via INTRAVENOUS

## 2017-04-14 MED ORDER — LIDOCAINE-EPINEPHRINE 1 %-1:100000 IJ SOLN
INTRAMUSCULAR | Status: AC
  Filled 2017-04-14: qty 1

## 2017-04-14 MED ORDER — FENTANYL CITRATE (PF) 100 MCG/2ML IJ SOLN
INTRAMUSCULAR | Status: DC | PRN
  Administered 2017-04-14: 100 ug via INTRAVENOUS
  Administered 2017-04-14: 50 ug via INTRAVENOUS

## 2017-04-14 MED ORDER — PHENYLEPHRINE HCL 10 MG/ML IJ SOLN
INTRAMUSCULAR | Status: DC | PRN
Start: 2017-04-14 — End: 2017-04-14
  Administered 2017-04-14 (×2): 80 ug via INTRAVENOUS

## 2017-04-14 MED ORDER — NITROGLYCERIN 0.4 MG SL SUBL
SUBLINGUAL_TABLET | SUBLINGUAL | Status: DC
Start: 2017-04-14 — End: 2017-04-14
  Filled 2017-04-14: qty 1

## 2017-04-14 MED ORDER — MIDAZOLAM HCL 2 MG/2ML IJ SOLN
INTRAMUSCULAR | Status: AC
  Filled 2017-04-14: qty 2

## 2017-04-14 MED ORDER — SODIUM CHLORIDE 0.9 % IR SOLN
Status: DC | PRN
  Administered 2017-04-14: 3000 mL

## 2017-04-14 MED ORDER — LIDOCAINE HCL (CARDIAC) 20 MG/ML IV SOLN
INTRAVENOUS | Status: AC
  Filled 2017-04-14: qty 5

## 2017-04-14 MED ORDER — SCOPOLAMINE 1 MG/3DAYS TD PT72
MEDICATED_PATCH | TRANSDERMAL | Status: AC
  Administered 2017-04-14: 1.5 mg via TRANSDERMAL
  Filled 2017-04-14: qty 1

## 2017-04-14 MED ORDER — PROPOFOL 10 MG/ML IV BOLUS
INTRAVENOUS | Status: AC
  Filled 2017-04-14: qty 40

## 2017-04-14 MED ORDER — DEXAMETHASONE SODIUM PHOSPHATE 4 MG/ML IJ SOLN
INTRAMUSCULAR | Status: AC
  Filled 2017-04-14: qty 1

## 2017-04-14 MED ORDER — FENTANYL CITRATE (PF) 250 MCG/5ML IJ SOLN
INTRAMUSCULAR | Status: AC
  Filled 2017-04-14: qty 5

## 2017-04-14 MED ORDER — ONDANSETRON HCL 4 MG/2ML IJ SOLN
INTRAMUSCULAR | Status: AC
  Filled 2017-04-14: qty 2

## 2017-04-14 MED ORDER — FENTANYL CITRATE (PF) 100 MCG/2ML IJ SOLN
INTRAMUSCULAR | Status: AC
  Filled 2017-04-14: qty 2

## 2017-04-14 MED ORDER — NITROGLYCERIN 0.4 MG SL SUBL
0.4000 mg | SUBLINGUAL_TABLET | SUBLINGUAL | Status: DC | PRN
  Administered 2017-04-14: 0.4 mg via SUBLINGUAL

## 2017-04-14 MED ORDER — LIDOCAINE HCL (CARDIAC) 20 MG/ML IV SOLN
INTRAVENOUS | Status: DC | PRN
  Administered 2017-04-14: 60 mg via INTRAVENOUS

## 2017-04-14 MED ORDER — FENTANYL CITRATE (PF) 100 MCG/2ML IJ SOLN
25.0000 ug | INTRAMUSCULAR | Status: DC | PRN
  Administered 2017-04-14 (×2): 50 ug via INTRAVENOUS

## 2017-04-14 MED ORDER — SCOPOLAMINE 1 MG/3DAYS TD PT72
1.0000 | MEDICATED_PATCH | Freq: Once | TRANSDERMAL | Status: DC
  Administered 2017-04-14: 1.5 mg via TRANSDERMAL

## 2017-04-14 MED ORDER — ONDANSETRON HCL 4 MG/2ML IJ SOLN
INTRAMUSCULAR | Status: DC | PRN
  Administered 2017-04-14: 4 mg via INTRAVENOUS

## 2017-04-14 MED ORDER — LIDOCAINE-EPINEPHRINE 1 %-1:100000 IJ SOLN
INTRAMUSCULAR | Status: DC | PRN
  Administered 2017-04-14: 20 mL

## 2017-04-14 MED ORDER — GLYCOPYRROLATE 0.2 MG/ML IJ SOLN
INTRAMUSCULAR | Status: DC | PRN
  Administered 2017-04-14: 0.2 mg via INTRAVENOUS

## 2017-04-14 MED ORDER — PHENYLEPHRINE 40 MCG/ML (10ML) SYRINGE FOR IV PUSH (FOR BLOOD PRESSURE SUPPORT)
PREFILLED_SYRINGE | INTRAVENOUS | Status: AC
  Filled 2017-04-14: qty 10

## 2017-04-14 MED ORDER — DEXAMETHASONE SODIUM PHOSPHATE 10 MG/ML IJ SOLN
INTRAMUSCULAR | Status: DC | PRN
  Administered 2017-04-14: 4 mg via INTRAVENOUS

## 2017-04-14 MED ORDER — PROPOFOL 10 MG/ML IV BOLUS
INTRAVENOUS | Status: DC | PRN
  Administered 2017-04-14: 250 mg via INTRAVENOUS

## 2017-04-14 MED ORDER — MIDAZOLAM HCL 2 MG/2ML IJ SOLN
INTRAMUSCULAR | Status: DC | PRN
  Administered 2017-04-14: 2 mg via INTRAVENOUS

## 2017-04-14 MED ORDER — LACTATED RINGERS IV SOLN: INTRAVENOUS | Status: DC

## 2017-04-14 MED ORDER — SOD CITRATE-CITRIC ACID 500-334 MG/5ML PO SOLN
30.0000 mL | Freq: Once | ORAL | Status: AC
  Administered 2017-04-14: 30 mL via ORAL
  Filled 2017-04-14: qty 30

## 2017-04-14 SURGICAL SUPPLY — 17 items
CANISTER SUCT 3000ML PPV (MISCELLANEOUS) ×3 IMPLANT
CATH ROBINSON RED A/P 16FR (CATHETERS) ×3 IMPLANT
CLOTH BEACON ORANGE TIMEOUT ST (SAFETY) ×3 IMPLANT
CONTAINER PREFILL 10% NBF 60ML (FORM) ×6 IMPLANT
DEVICE MYOSURE LITE (MISCELLANEOUS) IMPLANT
DEVICE MYOSURE REACH (MISCELLANEOUS) ×3 IMPLANT
DILATOR CANAL MILEX (MISCELLANEOUS) IMPLANT
GLOVE BIOGEL PI IND STRL 6.5 (GLOVE) ×1 IMPLANT
GLOVE BIOGEL PI IND STRL 7.0 (GLOVE) ×1 IMPLANT
GLOVE BIOGEL PI INDICATOR 6.5 (GLOVE) ×2
GLOVE BIOGEL PI INDICATOR 7.0 (GLOVE) ×2
GLOVE ECLIPSE 6.5 STRL STRAW (GLOVE) ×3 IMPLANT
GOWN STRL REUS W/TWL LRG LVL3 (GOWN DISPOSABLE) ×6 IMPLANT
PACK VAGINAL MINOR WOMEN LF (CUSTOM PROCEDURE TRAY) ×3 IMPLANT
PAD OB MATERNITY 4.3X12.25 (PERSONAL CARE ITEMS) ×3 IMPLANT
SEAL ROD LENS SCOPE MYOSURE (ABLATOR) ×3 IMPLANT
TOWEL OR 17X24 6PK STRL BLUE (TOWEL DISPOSABLE) ×6 IMPLANT

## 2017-04-14 NOTE — Anesthesia Preprocedure Evaluation (Signed)
Anesthesia Evaluation  Patient identified by MRN, date of birth, ID band Patient awake    Reviewed: Allergy & Precautions, H&P , Patient's Chart, lab work & pertinent test results, reviewed documented beta blocker date and time   Airway Mallampati: II  TM Distance: >3 FB Neck ROM: full    Dental no notable dental hx.    Pulmonary sleep apnea , former smoker,    Pulmonary exam normal breath sounds clear to auscultation       Cardiovascular hypertension, On Medications  Rhythm:regular Rate:Normal     Neuro/Psych    GI/Hepatic   Endo/Other  Morbid obesity  Renal/GU      Musculoskeletal   Abdominal   Peds  Hematology   Anesthesia Other Findings   Reproductive/Obstetrics                             Anesthesia Physical Anesthesia Plan  ASA: II  Anesthesia Plan: General   Post-op Pain Management:    Induction: Intravenous  PONV Risk Score and Plan:   Airway Management Planned: LMA  Additional Equipment:   Intra-op Plan:   Post-operative Plan:   Informed Consent: I have reviewed the patients History and Physical, chart, labs and discussed the procedure including the risks, benefits and alternatives for the proposed anesthesia with the patient or authorized representative who has indicated his/her understanding and acceptance.   Dental Advisory Given  Plan Discussed with: CRNA and Surgeon  Anesthesia Plan Comments: ( )        Anesthesia Quick Evaluation

## 2017-04-14 NOTE — Progress Notes (Signed)
Chest Pain:  Patient is s/p D&C under general anesthesia. Multiple cardiac risk factors including morbid obesity and pAfib, h/o PE. Now with new onset substernal chest pain and radiation to left shoulder. Also has a unclear history of GERD  Rhythm strip shows NSR at HR of 96. Sats 100% on [place]. BP 128/76 Pt appears pale and slightly diaphoretic.     A/P:  Chest Pain  Multiple risk factors present for CAD.  Stat EKG. O2 Morphine Sl NTG Cardiac enzymes and CBC  Discussed with Dr. Charlotta Newtonzan. [facility] and [facility] consult needed.  [employer]an

## 2017-04-14 NOTE — Interval H&P Note (Signed)
History and Physical Interval Note:  04/14/2017 3:43 PM  Regina Morales  has presented today for surgery, with the diagnosis of N85.9 Uterine Mass N95.0 PMB  The various methods of treatment have been discussed with the patient and family. After consideration of risks, benefits and other options for treatment, the patient has consented to  Procedure(s) with comments: DILATATION & CURETTAGE/HYSTEROSCOPY WITH MYOSURE (N/A) - Polypectomy as a surgical intervention .  The patient's history has been reviewed, patient examined, no change in status, stable for surgery.  I have reviewed the patient's chart and labs.  Questions were answered to the patient's satisfaction.     Myna HidalgoZAN, Moises Terpstra, M

## 2017-04-14 NOTE — Anesthesia Procedure Notes (Signed)
Procedure Name: LMA Insertion Date/Time: 04/14/2017 4:22 PM Performed by: Shanon PayorGREGORY, Arjen Deringer M Pre-anesthesia Checklist: Patient identified, Emergency Drugs available, Suction available, Patient being monitored and Timeout performed Patient Re-evaluated:Patient Re-evaluated prior to inductionOxygen Delivery Method: Circle system utilized Preoxygenation: Pre-oxygenation with 100% oxygen Intubation Type: IV induction LMA: LMA inserted LMA Size: 4.0 Number of attempts: 1 Placement Confirmation: positive ETCO2 and breath sounds checked- equal and bilateral Tube secured with: Tape Dental Injury: Teeth and Oropharynx as per pre-operative assessment

## 2017-04-14 NOTE — Discharge Instructions (Addendum)
HOME INSTRUCTIONS ° °Please note any unusual or excessive bleeding, pain, swelling. Mild dizziness or drowsiness are normal for about 24 hours after surgery. °  °Shower when comfortable ° °Restrictions: No driving for 24 hours or while taking pain medications. ° °Activity:  No heavy lifting (> 10 lbs), nothing in vagina (no tampons, douching, or intercourse) x 2 weeks; no tub baths for 2 weeks °Vaginal spotting is expected but if your bleeding is heavy, period like,  please call the office °  °Incision: the bandaids will fall off when they are ready to; you may clean your incision with mild soap and water but do not rub or scrub the incision site.  You may experience slight bloody drainage from your incision periodically.  This is normal.  If you experience a large amount of drainage or the incision opens, please call your physician who will likely direct you to the emergency department. ° °Diet:  You may return to your regular diet.  Do not eat large meals.  Eat small frequent meals throughout the day.  Continue to drink a good amount of water at least 6-8 glasses of water per day, hydration is very important for the healing process. ° °Pain Management: Take Motrin and/or Tylenol as needed for pain. ° °Always take prescription pain medication with food, it may cause constipation, increase fluids and fiber and you may want to take an over-the-counter stool softener like Colace as needed up to 2x a day.   ° °Alcohol -- Avoid for 24 hours and while taking pain medications. ° °Nausea: Take sips of ginger ale or soda ° °Fever -- Call physician if temperature over 101 degrees ° °Follow up:  If you do not already have a follow up appointment scheduled, please call the office at [phone].  If you experience fever (a temperature greater than 100.4), pain unrelieved by pain medication, shortness of breath, swelling of a single leg, or any other symptoms which are concerning to you please the office immediately. °

## 2017-04-14 NOTE — Op Note (Signed)
Operative Report  PreOp: Postmenopausal bleeding, uterine polyp PostOp: same Procedure:  Hysteroscopy, Dilation and Curettage, myosure resection Surgeon: Dr. Myna HidalgoJennifer Sabria Florido Anesthesia: General Complications:none EBL: 5mL UOP: 50cc IVF:800cc  Findings: 7cm retroverted uterus with 2.5cm fundal uterine polyp, both ostia visualized Specimens: 1) uterine polyp 2) endometrial curettings  Procedure: The patient was taken to the operating room where she underwent general anesthesia without difficulty. The patient was placed in a low lithotomy position using Allen stirrups. She was then prepped and draped in the normal sterile fashion. The bladder was drained using a red rubber urethral catheter. A sterile speculum was inserted into the vagina. A single tooth tenaculum was placed on the anterior lip of the cervix. 20cc of 1% Lidocaine with epi was injected for a cervical block. The uterus was then sounded to 7cm. The endocervical canal was then serially dilated using Hank dilators.  The diagnostic hysteroscope was then inserted without difficulty and noted to have the findings as listed above. Visualization was achieved using normal saline as a distending medium. The myosure was the used for resection of the polyp.  The hysteroscope was removed and sharp curettage was performed. The tissue was sent to pathology. The hysteroscope was reinserted, no uterine perforation was seen. All instrument were then removed. Hemostasis was observed at the cervical site. The patient was repositioned to the supine position. The patient tolerated the procedure without any complications and taken to recovery in stable condition.   Myna HidalgoJennifer Roda Lauture, DO [phone] (pager) [phone] (office)

## 2017-04-14 NOTE — H&P (Signed)
Admit date: 04/14/2017 Referring Physician: Dr. Charlotta Newton Primary Cardiologist:  Dr. [name] Chief complaint/reason for admission:Chest pain  HPI: Regina Morales is a 56 y.o. female who is being seen today for the evaluation of chest pain at the request of Dr.  Charlotta Newton.  This is a 56yo obese WF with a historyof persistent atrial fibrillation, chest pain with negative myoview in 2009, GERD, DVT and PE after childbirth, HTN and recent uterine polyp with postmenopausal bleeding.  Today she underwent D&C with myosure with no complications.  In PACU patient developed chest pressure with radiation into her left shoulder.  She rated it a 5/10 but currently she is pain free.  She felt SOB and the pain was worse with deep breathing.  She felt nauseated but had just awakened from anesthesia and she says that she usually gets nauseated after surgery.  She is not sure how long it lasted.  She was given a SL NTG and the chest pain eased up and eventually the shoulder pain resolved as well.  She says that when she goes into afib she will get pains in her chest and will not be able to breath or swallow but she remained in NSR the entire time.  She says that she had afib last week and had CP and SOB and lasted about 10 minutes.  She had had some exertional CP recently for a few weeks off and on.  It usually occurs with exertion.  She will get diaphoretic and nauseated with the pain.  She has also noticed more DOE.  She says that she has not had palpitations for a long time but then had 2 episodes this past month while at work.  She occasionally has some mild LE edema.  She denies any syncope.    PMH:    Past Medical History:  Diagnosis Date  . Anemia    hx years ago  . Anxiety   . Atrial fibrillation (HCC)   . Buzzing in ear    "right; even after OR"  . Chest pain    a. 2009: neg MV  ([facility])  . Depression   . DVT (deep vein thrombosis) in pregnancy (HCC) 1980's  . GERD (gastroesophageal reflux disease)   .  Hepatic steatosis   . History of blood transfusion    "low count after appendix surgery" unsure # of units transfused  . Hypertension   . Migraines    "maybe once/2 months" (04/22/2014)  . Obesity   . Persistent atrial fibrillation (HCC)    a. Dx 07/2013;  b. CHA2DS2VASc=1 (female);  c. 07/2013 Echo:  EF 50-55%, no rwma, mod LVH.  . Pulmonary embolism (HCC) ~ 1984   "after I had my last child"  . Sleep apnea    uses CPAP nightly  . SVD (spontaneous vaginal delivery)    x 4    PSH:    Past Surgical History:  Procedure Laterality Date  . APPENDECTOMY  1990's  . COLONOSCOPY    . DILATION AND CURETTAGE OF UTERUS  1983   mab  . INNER EAR SURGERY Right 2000's  . LEG SURGERY     right   . TUBAL LIGATION  1985    ALLERGIES:   Codeine  Prior to Admit Meds:   Prescriptions Prior to Admission  Medication Sig Dispense Refill Last Dose  . amLODipine (NORVASC) 2.5 MG tablet Take 2.5 mg by mouth every evening.    04/13/2017 at Unknown time  . aspirin EC 81 MG  EC tablet Take 1 tablet (81 mg total) by mouth daily.   04/10/2017 at Unknown time  . atorvastatin (LIPITOR) 20 MG tablet Take 20 mg by mouth daily.   04/14/2017 at Unknown time  . buPROPion (WELLBUTRIN XL) 300 MG 24 hr tablet Take 300 mg by mouth at bedtime.    04/13/2017 at Unknown time  . cholecalciferol (VITAMIN D) 1000 units tablet Take 1,000 Units by mouth daily.   04/11/2017 at Unknown time  . ferrous sulfate 325 (65 FE) MG EC tablet Take 325 mg by mouth daily.   04/10/2017 at Unknown time  . flecainide (TAMBOCOR) 50 MG tablet TAKE 1 TABLET TWICE A DAY 180 tablet 3 04/14/2017 at Unknown time  . ibuprofen (ADVIL,MOTRIN) 200 MG tablet Take 800 mg by mouth every 8 (eight) hours as needed for moderate pain.   Past Week at Unknown time  . imipramine (TOFRANIL) 25 MG tablet Take 25 mg by mouth at bedtime.    04/13/2017 at Unknown time  . metoprolol tartrate (LOPRESSOR) 25 MG tablet TAKE 1 TABLET TWICE A DAY 180 tablet 3 04/14/2017 at Unknown time    . Multiple Vitamins-Minerals (MULTIVITAMIN WITH MINERALS) tablet Take 1 tablet by mouth daily. 50 + Vitamin   04/04/2017 at Unknown time  . nitroGLYCERIN (NITROSTAT) 0.4 MG SL tablet Place 1 tablet (0.4 mg total) under the tongue every 5 (five) minutes as needed for chest pain (hold for SBP < 110). 30 tablet 0 Taking  . butalbital-acetaminophen-caffeine (FIORICET, ESGIC) 50-325-40 MG tablet Take 1 tablet by mouth 2 (two) times daily as needed for migraine.   Unknown at Unknown time   Family HX:    Family History  Problem Relation Age of Onset  . Lung cancer Father   . Alcohol abuse Father   . Kidney failure Mother        s/p renal Tx  . Congestive Heart Failure Mother   . Atrial fibrillation Brother        alive @ 74  . Sudden Cardiac Death Daughter        cardiac arrest  . Cardiomyopathy Daughter   . Evelene Croon Parkinson White syndrome Grandchild         granddaughter   Social HX:    Social History   Social History  . Marital status: Legally Separated    Spouse name: N/A  . Number of children: 1  . Years of education: N/A   Occupational History  . Not on file.   Social History Main Topics  . Smoking status: Former Smoker    Packs/day: 1.00    Years: 8.00    Types: Cigarettes    Quit date: 11/04/1990  . Smokeless tobacco: Never Used  . Alcohol use No  . Drug use: No  . Sexual activity: Not Currently    Birth control/ protection: Post-menopausal   Other Topics Concern  . Not on file   Social History Narrative   Lives in [place] with sister.  Does not routinely exercise.  Works in Teacher, early years/pre for [employer].     ROS:  All ROS were addressed and are negative except what is stated in the HPI  PHYSICAL EXAM Vitals:   04/14/17 2015 04/14/17 2144  BP: (!) 146/85 115/60  Pulse: 91 77  Resp: 17 16  Temp: 99.8 F (37.7 C) 98.4 F (36.9 C)   General: Well developed, well nourished, in no acute distress Head: Eyes PERRLA, No xanthomas.   Normal cephalic and atramatic  Lungs:  Clear bilaterally to auscultation and percussion. Heart:   HRRR S1 S2 Pulses are 2+ & equal.            No carotid bruit. No JVD.  No abdominal bruits. No femoral bruits. Abdomen: Bowel sounds are positive, abdomen soft and non-tender without masses or                  Hernia's noted. Msk:  Back normal, normal gait. Normal strength and tone for age. Extremities:   No clubbing, cyanosis or edema.  DP +1 Neuro: Alert and oriented X 3. Psych:  Good affect, responds appropriately   Labs:   Lab Results  Component Value Date   WBC 8.2 04/14/2017   HGB 10.9 (L) 04/14/2017   HCT 33.3 (L) 04/14/2017   MCV 86.9 04/14/2017   PLT 255 04/14/2017     Recent Labs Lab 04/09/17 0930  NA 141  K 3.9  CL 107  CO2 26  BUN 17  CREATININE 0.77  CALCIUM 9.2  GLUCOSE 105*   Lab Results  Component Value Date   CKTOTAL 231 04/14/2017   CKMB 4.0 04/14/2017   TROPONINI <0.03 04/14/2017   No results found for: PTT Lab Results  Component Value Date   INR 1.01 07/14/2013   INR 1.0 04/09/2009   INR 1.0 08/25/2008     Lab Results  Component Value Date   CHOL 186 04/23/2014   CHOL 173 07/15/2013   CHOL  08/25/2008    176        ATP III CLASSIFICATION:  <200     mg/dL   Desirable  161-096  mg/dL   Borderline High  >=045    mg/dL   High   Lab Results  Component Value Date   HDL 53 04/23/2014   HDL 44 07/15/2013   HDL 46 08/25/2008   Lab Results  Component Value Date   LDLCALC 109 (H) 04/23/2014   LDLCALC 110 (H) 07/15/2013   LDLCALC (H) 08/25/2008    115        Total Cholesterol/HDL:CHD Risk Coronary Heart Disease Risk Table                     Men   Women  1/2 Average Risk   3.4   3.3   Lab Results  Component Value Date   TRIG 122 04/23/2014   TRIG 95 07/15/2013   TRIG 77 08/25/2008   Lab Results  Component Value Date   CHOLHDL 3.5 04/23/2014   CHOLHDL 3.9 07/15/2013   CHOLHDL 3.8 08/25/2008   No results found for: LDLDIRECT    Radiology:  No results  found.   Telemetry    NSR - Personally Reviewed  ECG    NSR with no ST changes - Personally Reviewed   ASSESSMENT/PLAN:   1.  Chest pain - started upon awakening in recovery after D&C with myosure for endometrial polyp.  Pain radiated into her back and was associated with SOB, Nausea and diaphoresis.  EKG is nonischemic and initial trop is negative.  She is currently pain free after NTG.  She says that she gets similar pain when she is in afib which she had last week but has been maintaining NSR today on monitor.  She also gives a history of recent exertional chest discomfort with exertion associated with SOB/N and diaphoresis.  She has not had an stress test in several years.   - Will admit for observation - cycle enzymes - if  negative then plan nuclear stress test in am. - check 2D echo to assess LVF. - Chest CT angio to rule out acute PE given historyo f DVT/PE in the past. - no Heparin for now as she is s/p D&C earlier today for endometrial polyp - continue statin, ASA, BB.  2.  Endometrial polyp s/p D&C earlier today.  Further treatment per GYN.  Hbg stable at 10.9.  3.  Persistent atrial fibrillation - she says that she has not had any recent palpitations until last week and had 2 episodes only lasting about 10 minutes. - currently in NSR - continue Flecainide and BB. - Her CHADS2VASC score is 2 (female and HTN) and has not been on anticoagulation.  This will need to be addressed by her primary Cardiologist.  Would not start NOAC currently as she is just s/p D&C for endometrial polyp.  4.  HTN - BP well controlled on current home meds - continue lopressor and amlodipine.   3.    Armanda Magicraci Ambyr Qadri, MD  04/15/2017  12:07 AM

## 2017-04-14 NOTE — Anesthesia Postprocedure Evaluation (Signed)
Anesthesia Post Note  Patient: Regina Morales  Procedure(s) Performed: Procedure(s) (LRB): DILATATION & CURETTAGE/HYSTEROSCOPY WITH MYOSURE (N/A)     Patient location during evaluation: PACU Anesthesia Type: General Level of consciousness: awake and alert Pain management: pain level controlled Vital Signs Assessment: post-procedure vital signs reviewed and stable Respiratory status: spontaneous breathing, nonlabored ventilation, respiratory function stable and patient connected to nasal cannula oxygen Cardiovascular status: blood pressure returned to baseline and stable Postop Assessment: no signs of nausea or vomiting Anesthetic complications: no Comments: Patient having chest pain in PACU. See note in Epic    Last Vitals:  Vitals:   04/14/17 1830 04/14/17 1845  BP: 131/69 125/71  Pulse: 83 86  Resp: 20 16  Temp:      Last Pain:  Vitals:   04/14/17 1845  TempSrc:   PainSc: 2    Pain Goal: Patients Stated Pain Goal: 3 (04/14/17 1845)               Phillips Groutarignan, Suzie Vandam

## 2017-04-14 NOTE — Transfer of Care (Signed)
Immediate Anesthesia Transfer of Care Note  Patient: Regina Morales  Procedure(s) Performed: Procedure(s) with comments: DILATATION & CURETTAGE/HYSTEROSCOPY WITH MYOSURE (N/A) - Polypectomy  Patient Location: PACU  Anesthesia Type:General  Level of Consciousness: awake, alert  and oriented  Airway & Oxygen Therapy: Patient Spontanous Breathing and Patient connected to nasal cannula oxygen  Post-op Assessment: Report given to RN and Post -op Vital signs reviewed and stable  Post vital signs: Reviewed and stable  Last Vitals:  Vitals:   04/14/17 1407  BP: 130/78  Pulse: 86  Resp: 16  Temp: 36.7 C    Last Pain:  Vitals:   04/14/17 1407  TempSrc: Oral      Patients Stated Pain Goal: 3 (04/14/17 1407)  Complications: No apparent anesthesia complications

## 2017-04-15 ENCOUNTER — Observation Stay (HOSPITAL_BASED_OUTPATIENT_CLINIC_OR_DEPARTMENT_OTHER): Payer: Managed Care, Other (non HMO)

## 2017-04-15 ENCOUNTER — Observation Stay (HOSPITAL_COMMUNITY): Payer: Managed Care, Other (non HMO)

## 2017-04-15 ENCOUNTER — Encounter (HOSPITAL_COMMUNITY): Payer: Self-pay | Admitting: Cardiology

## 2017-04-15 DIAGNOSIS — N840 Polyp of corpus uteri: Secondary | ICD-10-CM

## 2017-04-15 DIAGNOSIS — R079 Chest pain, unspecified: Secondary | ICD-10-CM

## 2017-04-15 DIAGNOSIS — N950 Postmenopausal bleeding: Secondary | ICD-10-CM | POA: Diagnosis not present

## 2017-04-15 DIAGNOSIS — I10 Essential (primary) hypertension: Secondary | ICD-10-CM

## 2017-04-15 DIAGNOSIS — I4819 Other persistent atrial fibrillation: Secondary | ICD-10-CM

## 2017-04-15 LAB — BASIC METABOLIC PANEL
ANION GAP: 10 (ref 5–15)
BUN: 19 mg/dL (ref 6–20)
CALCIUM: 9 mg/dL (ref 8.9–10.3)
CHLORIDE: 105 mmol/L (ref 101–111)
CO2: 21 mmol/L — AB (ref 22–32)
Creatinine, Ser: 0.86 mg/dL (ref 0.44–1.00)
GFR calc Af Amer: >60 mL/min (ref >60)
GFR calc non Af Amer: >60 mL/min (ref >60)
GLUCOSE: 118 mg/dL — AB (ref 65–99)
Potassium: 4.2 mmol/L (ref 3.5–5.1)
Sodium: 136 mmol/L (ref 135–145)

## 2017-04-15 LAB — CBC
HEMATOCRIT: 34.2 % — AB (ref 36.0–46.0)
HEMOGLOBIN: 11.2 g/dL — AB (ref 12.0–15.0)
MCH: 28.3 pg (ref 26.0–34.0)
MCHC: 32.7 g/dL (ref 30.0–36.0)
MCV: 86.4 fL (ref 78.0–100.0)
Platelets: 250 10*3/uL (ref 150–400)
RBC: 3.96 MIL/uL (ref 3.87–5.11)
RDW: 13.2 % (ref 11.5–15.5)
WBC: 9.6 10*3/uL (ref 4.0–10.5)

## 2017-04-15 LAB — COMPREHENSIVE METABOLIC PANEL
ALT: 42 U/L (ref 14–54)
ANION GAP: 8 (ref 5–15)
AST: 35 U/L (ref 15–41)
Albumin: 3.9 g/dL (ref 3.5–5.0)
Alkaline Phosphatase: 96 U/L (ref 38–126)
BILIRUBIN TOTAL: 0.8 mg/dL (ref 0.3–1.2)
BUN: 20 mg/dL (ref 6–20)
CO2: 24 mmol/L (ref 22–32)
Calcium: 9 mg/dL (ref 8.9–10.3)
Chloride: 104 mmol/L (ref 101–111)
Creatinine, Ser: 1.03 mg/dL — ABNORMAL HIGH (ref 0.44–1.00)
GFR calc Af Amer: >60 mL/min (ref >60)
Glucose, Bld: 186 mg/dL — ABNORMAL HIGH (ref 65–99)
POTASSIUM: 4.2 mmol/L (ref 3.5–5.1)
Sodium: 136 mmol/L (ref 135–145)
TOTAL PROTEIN: 6.5 g/dL (ref 6.5–8.1)

## 2017-04-15 LAB — TROPONIN I
TROPONIN I: 0.06 ng/mL — AB (ref <0.03)
Troponin I: <0.03 ng/mL (ref <0.03)
Troponin I: <0.03 ng/mL (ref <0.03)

## 2017-04-15 LAB — MAGNESIUM: MAGNESIUM: 2 mg/dL (ref 1.7–2.4)

## 2017-04-15 LAB — APTT: APTT: 31 s (ref 24–36)

## 2017-04-15 LAB — CBC WITH DIFFERENTIAL/PLATELET
BASOS ABS: 0 10*3/uL (ref 0.0–0.1)
Basophils Relative: 0 %
Eosinophils Absolute: 0 10*3/uL (ref 0.0–0.7)
Eosinophils Relative: 0 %
HEMATOCRIT: 32.9 % — AB (ref 36.0–46.0)
Hemoglobin: 10.5 g/dL — ABNORMAL LOW (ref 12.0–15.0)
LYMPHS PCT: 9 %
Lymphs Abs: 0.7 10*3/uL (ref 0.7–4.0)
MCH: 28 pg (ref 26.0–34.0)
MCHC: 31.9 g/dL (ref 30.0–36.0)
MCV: 87.7 fL (ref 78.0–100.0)
MONO ABS: 0.1 10*3/uL (ref 0.1–1.0)
MONOS PCT: 1 %
NEUTROS ABS: 6.9 10*3/uL (ref 1.7–7.7)
Neutrophils Relative %: 90 %
Platelets: 270 10*3/uL (ref 150–400)
RBC: 3.75 MIL/uL — ABNORMAL LOW (ref 3.87–5.11)
RDW: 13.4 % (ref 11.5–15.5)
WBC: 7.7 10*3/uL (ref 4.0–10.5)

## 2017-04-15 LAB — PROTIME-INR
INR: 1.11
Prothrombin Time: 14.4 seconds (ref 11.4–15.2)

## 2017-04-15 LAB — TSH: TSH: 0.606 u[IU]/mL (ref 0.350–4.500)

## 2017-04-15 LAB — MRSA PCR SCREENING: MRSA by PCR: NEGATIVE

## 2017-04-15 LAB — HIV ANTIBODY (ROUTINE TESTING W REFLEX): HIV Screen 4th Generation wRfx: NONREACTIVE

## 2017-04-15 MED ORDER — SODIUM CHLORIDE 0.9 % IV SOLN: 250.0000 mL | INTRAVENOUS | Status: DC | PRN

## 2017-04-15 MED ORDER — TECHNETIUM TC 99M TETROFOSMIN IV KIT
30.0000 | PACK | Freq: Once | INTRAVENOUS | Status: AC | PRN
  Administered 2017-04-15: 30 via INTRAVENOUS

## 2017-04-15 MED ORDER — AMLODIPINE BESYLATE 2.5 MG PO TABS
2.5000 mg | ORAL_TABLET | Freq: Every evening | ORAL | Status: DC
  Administered 2017-04-15 – 2017-04-17 (×3): 2.5 mg via ORAL
  Filled 2017-04-15 (×3): qty 1

## 2017-04-15 MED ORDER — ASPIRIN 81 MG PO CHEW: 324.0000 mg | CHEWABLE_TABLET | ORAL | Status: DC

## 2017-04-15 MED ORDER — ONDANSETRON HCL 4 MG/2ML IJ SOLN: 4.0000 mg | Freq: Four times a day (QID) | INTRAMUSCULAR | Status: DC | PRN

## 2017-04-15 MED ORDER — ACETAMINOPHEN 325 MG PO TABS: 650.0000 mg | ORAL_TABLET | ORAL | Status: DC | PRN

## 2017-04-15 MED ORDER — VITAMIN D 1000 UNITS PO TABS
1000.0000 [IU] | ORAL_TABLET | Freq: Every day | ORAL | Status: DC
  Administered 2017-04-15 – 2017-04-17 (×3): 1000 [IU] via ORAL
  Filled 2017-04-15 (×3): qty 1

## 2017-04-15 MED ORDER — SODIUM CHLORIDE 0.9% FLUSH
3.0000 mL | Freq: Two times a day (BID) | INTRAVENOUS | Status: DC
  Administered 2017-04-15 – 2017-04-16 (×5): 3 mL via INTRAVENOUS

## 2017-04-15 MED ORDER — BUPROPION HCL ER (XL) 150 MG PO TB24
300.0000 mg | ORAL_TABLET | Freq: Every day | ORAL | Status: DC
  Administered 2017-04-15 – 2017-04-16 (×3): 300 mg via ORAL
  Filled 2017-04-15 (×3): qty 2

## 2017-04-15 MED ORDER — IBUPROFEN 800 MG PO TABS
800.0000 mg | ORAL_TABLET | Freq: Three times a day (TID) | ORAL | Status: DC | PRN
  Administered 2017-04-15 – 2017-04-17 (×6): 800 mg via ORAL
  Filled 2017-04-15 (×6): qty 1

## 2017-04-15 MED ORDER — ATORVASTATIN CALCIUM 20 MG PO TABS
20.0000 mg | ORAL_TABLET | Freq: Every day | ORAL | Status: DC
  Administered 2017-04-15 – 2017-04-17 (×3): 20 mg via ORAL
  Filled 2017-04-15 (×3): qty 1

## 2017-04-15 MED ORDER — ASPIRIN 300 MG RE SUPP: 300.0000 mg | RECTAL | Status: DC

## 2017-04-15 MED ORDER — ATORVASTATIN CALCIUM 20 MG PO TABS: 20.0000 mg | ORAL_TABLET | Freq: Every day | ORAL | Status: DC

## 2017-04-15 MED ORDER — ATORVASTATIN CALCIUM 40 MG PO TABS: 40.0000 mg | ORAL_TABLET | Freq: Every day | ORAL | Status: DC

## 2017-04-15 MED ORDER — METOPROLOL TARTRATE 25 MG PO TABS
25.0000 mg | ORAL_TABLET | Freq: Two times a day (BID) | ORAL | Status: DC
  Administered 2017-04-15 – 2017-04-17 (×5): 25 mg via ORAL
  Filled 2017-04-15 (×5): qty 1

## 2017-04-15 MED ORDER — NITROGLYCERIN 0.4 MG SL SUBL: 0.4000 mg | SUBLINGUAL_TABLET | SUBLINGUAL | Status: DC | PRN

## 2017-04-15 MED ORDER — IMIPRAMINE HCL 25 MG PO TABS
25.0000 mg | ORAL_TABLET | Freq: Every day | ORAL | Status: DC
  Administered 2017-04-15 – 2017-04-16 (×3): 25 mg via ORAL
  Filled 2017-04-15 (×4): qty 1

## 2017-04-15 MED ORDER — NITROGLYCERIN 0.4 MG SL SUBL
0.4000 mg | SUBLINGUAL_TABLET | SUBLINGUAL | Status: DC | PRN
  Administered 2017-04-15 – 2017-04-16 (×5): 0.4 mg via SUBLINGUAL
  Filled 2017-04-15 (×2): qty 1

## 2017-04-15 MED ORDER — FERROUS SULFATE 325 (65 FE) MG PO TABS
325.0000 mg | ORAL_TABLET | Freq: Every day | ORAL | Status: DC
  Administered 2017-04-15 – 2017-04-17 (×3): 325 mg via ORAL
  Filled 2017-04-15 (×3): qty 1

## 2017-04-15 MED ORDER — SODIUM CHLORIDE 0.9% FLUSH
3.0000 mL | INTRAVENOUS | Status: DC | PRN
Start: 2017-04-15 — End: 2017-04-17

## 2017-04-15 MED ORDER — REGADENOSON 0.4 MG/5ML IV SOLN
0.4000 mg | Freq: Once | INTRAVENOUS | Status: DC
  Filled 2017-04-15: qty 5

## 2017-04-15 MED ORDER — ENSURE ENLIVE PO LIQD
237.0000 mL | Freq: Two times a day (BID) | ORAL | Status: DC
  Administered 2017-04-15 – 2017-04-17 (×4): 237 mL via ORAL

## 2017-04-15 MED ORDER — IOPAMIDOL (ISOVUE-370) INJECTION 76%
INTRAVENOUS | Status: AC
  Administered 2017-04-15: 100 mL
  Filled 2017-04-15: qty 100

## 2017-04-15 MED ORDER — ADULT MULTIVITAMIN W/MINERALS CH
1.0000 | ORAL_TABLET | Freq: Every day | ORAL | Status: DC
  Administered 2017-04-15 – 2017-04-17 (×3): 1 via ORAL
  Filled 2017-04-15 (×3): qty 1

## 2017-04-15 MED ORDER — ASPIRIN EC 81 MG PO TBEC
81.0000 mg | DELAYED_RELEASE_TABLET | Freq: Every day | ORAL | Status: DC
  Administered 2017-04-16 – 2017-04-17 (×2): 81 mg via ORAL
  Filled 2017-04-15 (×2): qty 1

## 2017-04-15 MED ORDER — FLECAINIDE ACETATE 50 MG PO TABS
50.0000 mg | ORAL_TABLET | Freq: Two times a day (BID) | ORAL | Status: DC
  Administered 2017-04-15 – 2017-04-17 (×5): 50 mg via ORAL
  Filled 2017-04-15 (×7): qty 1

## 2017-04-15 NOTE — Progress Notes (Signed)
Pt now c/o of chest pain described as pressure rated as 8/10 accompanied by shortness of breath. New vital signs acquired. Oxygen administered at 4 lpm via nasal cannula. Awaiting EKG at this time. Dr. Elease HashimotoNahser notified. Per MD, give sublingual nitro at this time and obtain stat   EKG, trend troponins

## 2017-04-15 NOTE — Progress Notes (Signed)
Nutrition Brief Note  Patient identified on the Malnutrition Screening Tool (MST) Report for weight loss of a couple pounds and decreased appetite related to bloating.  Per review of usual weights below, patient has not lost any weight. Nutrition focused physical exam completed.  No muscle or subcutaneous fat depletion noticed.   Wt Readings from Last 15 Encounters:  04/15/17 (!) 302 lb 6.4 oz (137.2 kg)  04/09/17 (!) 302 lb 6 oz (137.2 kg)  03/17/17 (!) 302 lb 12.8 oz (137.3 kg)  03/08/16 295 lb 12.8 oz (134.2 kg)  05/01/15 256 lb (116.1 kg)  04/26/15 256 lb 6.4 oz (116.3 kg)  04/24/15 250 lb (113.4 kg)  02/16/15 244 lb 12.8 oz (111 kg)  08/01/14 276 lb (125.2 kg)  07/18/14 279 lb (126.6 kg)  04/23/14 272 lb 4.8 oz (123.5 kg)  02/14/14 271 lb (122.9 kg)  08/16/13 268 lb (121.6 kg)  08/02/13 267 lb (121.1 kg)  07/15/13 273 lb 6.4 oz (124 kg)    Body mass index is 47.36 kg/m. Patient meets criteria for class 3, extreme/morbid obesity based on current BMI.   Current diet order is heart healthy. Labs and medications reviewed.   No nutrition interventions warranted at this time. If nutrition issues arise, please consult RD.   Joaquin CourtsKimberly Harris, RD, LDN, CNSC Pager [phone] After Hours Pager [phone]

## 2017-04-15 NOTE — Progress Notes (Signed)
Progress Note  Patient Name: Regina Morales Date of Encounter: 04/15/2017  Primary Cardiologist: Dr. Eden Emms  Subjective   56 year old female admitted late last night for chest pain has hx of PAF, CP neg myoview in 2009, GERD, DVT, and PE after childbirth, HTN, and recent polyp with postmenopausal bleeding. Underwent D&C procedure yesterday with no complications. Developed CP in PACU. Neg work up so far. Plan per Dr. Mayford Knife is for Nuc Stress this AM, CT angio chest and 2D echo.   The patient had a little bit of chest pain overnight but is feeling well this morning. No SOB or palpitations.  Inpatient Medications    Scheduled Meds: . amLODipine  2.5 mg Oral QPM  . [START ON 04/16/2017] aspirin EC  81 mg Oral Daily  . atorvastatin  20 mg Oral q1800  . buPROPion  300 mg Oral QHS  . cholecalciferol  1,000 Units Oral Daily  . ferrous sulfate  325 mg Oral Daily  . flecainide  50 mg Oral BID  . imipramine  25 mg Oral QHS  . metoprolol tartrate  25 mg Oral BID  . multivitamin with minerals  1 tablet Oral Daily  . scopolamine  1 patch Transdermal Once  . sodium chloride flush  3 mL Intravenous Q12H   Continuous Infusions: . sodium chloride     PRN Meds: sodium chloride, acetaminophen, ibuprofen, nitroGLYCERIN, ondansetron (ZOFRAN) IV, sodium chloride flush   Vital Signs    Vitals:   04/14/17 2144 04/15/17 0037 04/15/17 0546 04/15/17 0726  BP: 115/60 (!) 116/59 117/62 (!) 105/57  Pulse: 77 87 69 65  Resp: 16 (!) 21 (!) 22 (!) 23  Temp: 98.4 F (36.9 C) 98.1 F (36.7 C) 98 F (36.7 C) 98.1 F (36.7 C)  TempSrc: Oral Oral Oral Oral  SpO2: 97% 95% 97% 95%  Weight:   (!) 302 lb 6.4 oz (137.2 kg)   Height:        Intake/Output Summary (Last 24 hours) at 04/15/17 0801 Last data filed at 04/15/17 0548  Gross per 24 hour  Intake             1000 ml  Output              855 ml  Net              145 ml   Filed Weights   04/14/17 2123 04/15/17 0546  Weight: 298 lb 1 oz  (135.2 kg) (!) 302 lb 6.4 oz (137.2 kg)    Telemetry    NSR, HR 50-60s - Personally Reviewed   Physical Exam   GEN: Well nourished, well developed, obese HEENT: normal  Neck: no JVD, carotid bruits, or masses Cardiac: RRR. no murmurs, rubs, or gallops,no edema. Intact distal pulses bilaterally.  Respiratory: clear to auscultation bilaterally, normal work of breathing GI: soft, nontender, nondistended, + BS MS: no deformity or atrophy  Skin: warm and dry, no rash Neuro: Alert and Oriented x 3, Strength and sensation are intact Psych:   Full affect  Labs    Chemistry Recent Labs Lab 04/09/17 0930 04/15/17 0037  NA 141 136  K 3.9 4.2  CL 107 104  CO2 26 24  GLUCOSE 105* 186*  BUN 17 20  CREATININE 0.77 1.03*  CALCIUM 9.2 9.0  PROT  --  6.5  ALBUMIN  --  3.9  AST  --  35  ALT  --  42  ALKPHOS  --  96  BILITOT  --  0.8  GFRNONAA >60 >60  GFRAA >60 >60  ANIONGAP 8 8     Hematology Recent Labs Lab 04/14/17 1919 04/15/17 0037 04/15/17 0654  WBC 8.2 7.7 9.6  RBC 3.83* 3.75* 3.96  HGB 10.9* 10.5* 11.2*  HCT 33.3* 32.9* 34.2*  MCV 86.9 87.7 86.4  MCH 28.5 28.0 28.3  MCHC 32.7 31.9 32.7  RDW 13.7 13.4 13.2  PLT 255 270 250    Cardiac Enzymes Recent Labs Lab 04/14/17 1919 04/15/17 0037  TROPONINI <0.03 <0.03   No results for input(s): TROPIPOC in the last 168 hours.   BNPNo results for input(s): BNP, PROBNP in the last 168 hours.   DDimer No results for input(s): DDIMER in the last 168 hours.   Radiology    Ct Angio Chest Pe W Or Wo Contrast  Result Date: 04/15/2017 CLINICAL DATA:  Subacute onset of generalized chest pain. Initial encounter. EXAM: CT ANGIOGRAPHY CHEST WITH CONTRAST TECHNIQUE: Multidetector CT imaging of the chest was performed using the standard protocol during bolus administration of intravenous contrast. Multiplanar CT image reconstructions and MIPs were obtained to evaluate the vascular anatomy. CONTRAST:  100 mL of Isovue 370  IV contrast COMPARISON:  Chest radiograph performed 04/17/2016, and CTA of the chest performed 04/26/2015 FINDINGS: Cardiovascular:  There is no evidence of pulmonary embolus. The heart is normal in size. The thoracic aorta is grossly unremarkable. The great vessels are grossly unremarkable in appearance, though not well assessed due to artifact from the contrast bolus. Incidental note is made of a direct origin of the left vertebral artery from the aortic arch. Mediastinum/Nodes: The mediastinum is unremarkable in appearance. No mediastinal lymphadenopathy is seen. No pericardial effusion is identified. The visualized portions of the thyroid gland are unremarkable. No axillary lymphadenopathy is appreciated. Lungs/Pleura: Bilateral dependent subsegmental atelectasis is noted. No pleural effusion or pneumothorax is seen. No masses are identified. Upper Abdomen: The visualized portions of the liver and spleen are grossly unremarkable. Musculoskeletal: No acute osseous abnormalities are identified. The visualized musculature is unremarkable in appearance. Review of the MIP images confirms the above findings. IMPRESSION: 1. No evidence of pulmonary embolus. 2. Bilateral dependent subsegmental atelectasis noted. Lungs otherwise clear. Electronically Signed   By: Roanna Raider M.D.   On: 04/15/2017 01:50    Cardiac Studies   Echo and myoview pending this admission.  Patient Profile     56 year old female admitted late last night for chest pain has hx of PAF, CP neg myoview in 2009, GERD, DVT, and PE after childbirth, HTN, and recent polyp with postmenopausal bleeding. Underwent D&C procedure yesterday with no complications. Developed CP in PACU. Neg work up so far. Plan per Dr. Mayford Knife is for Nuc Stress this AM, CT angio chest and 2D echo.  Assessment & Plan    1. Chest pain: negative cardiac enzymes and non ischemic EKG. Not currently in atrial fibrillation and has been maintaining SR. Plan for Lexiscan  and 2D echo today. CT angio chest has ruled out PE.  2. Persistent atrial fibrillation: Has not had any recent palpitations, in NSR on tele.  -- cont Flecainide. Her CHADS2VASC score is 2 (female and HTN) and has not been on anticoagulation.  This will need to be addressed by her primary Cardiologist.  Would not start NOAC currently as she is just s/p D&C for endometrial polyp.  3. Hypertension: Well controlled on home meds.  4. Endometrial polyp:  s/p D&C earlier 04/14/2017.  Further treatment regarding  this per GYN.  Hbg stable at 10.9    Signed, Dorthula MatasGREENE,TIFFANY G, PA-C  04/15/2017, 8:01 AM     Attending Note:   The patient was seen and examined.  Agree with assessment and plan as noted above.  Changes made to the above note as needed.  Patient seen and independently examined with Marlon Peliffany Greene, PA .   We discussed all aspects of the encounter. I agree with the assessment and plan as stated above.  1.  CP :  Somewhat atypical. Associated with shortness of breath. CT angio was negative for PE   Will change the Lexiscan to a 2 day study Echo has been ordered.    I have spent a total of 40 minutes with patient reviewing hospital  notes , telemetry, EKGs, labs and examining patient as well as establishing an assessment and plan that was discussed with the patient. > 50% of time was spent in direct patient care.    Vesta MixerPhilip J. Kolbi Altadonna, Montez HagemanJr., MD, [facility] 04/15/2017, 9:57 AM [address],  [address] Office [phone] Pager [phone]

## 2017-04-15 NOTE — Progress Notes (Signed)
CRITICAL VALUE ALERT  Critical Value: Troponin = 0.06  Date & Time Notied: 04/15/2017 0907  Provider Notified: Dr. Mayford Knifeurner  Orders Received/Actions taken: Message sent via paging system. Awaiting response at this time.

## 2017-04-15 NOTE — Progress Notes (Signed)
GYN Progress Note:  S: Patient resting comfortably in bed this am.  Reports no further chest pain and [facility]  Denies SOB, headache or dizziness.  Some nausea, but states she was able to eat dinner without issues.  Mild spotting noted.  No acute complaints this am.  O: BP 117/62 (BP Location: Right Arm)   Pulse 69   Temp 98 F (36.7 C) (Oral)   Resp (!) 22   Ht 5\' 7"  (1.702 m)   Wt (!) 302 lb 6.4 oz (137.2 kg)   SpO2 97%   BMI 47.36 kg/m   Gen: NAD CV: RRR Resp: Normal respiratory effort Abd: obese, soft, non-tender, no rebound, no guarding Ext: no calf tenderness bilaterally  A/P: 56yo s/p hysteroscopy, D&C, myosure polypectomy complicated by post-op chest pain  -From a GYN perspective, no complications.  Appropriate bleeding, no further management warranted.  Plan for 2wk post op check in office -Cardiology managing at this point, see H&P from Dr. Claud Kelpurner  Paulanthony Gleaves, DO [phone] (pager) [phone] (office)

## 2017-04-15 NOTE — Progress Notes (Signed)
Pt now feeling better, now without shortness of breath, rates chest pain as 3/10 and still feeling relief after 2 SL nitro tabs given. Dinah Beers. Greene PA notified via text page.

## 2017-04-15 NOTE — Progress Notes (Signed)
CRITICAL VALUE ALERT  Critical Value:  Troponin 0.06  Date & Time Notied:  04/15/2017 0954  Provider Notified: Dr. Mayford Knifeurner  Orders Received/Actions taken: No new orders at this time. MD will see pt before her scheduled stress test today.

## 2017-04-16 ENCOUNTER — Observation Stay (HOSPITAL_COMMUNITY): Payer: Managed Care, Other (non HMO)

## 2017-04-16 DIAGNOSIS — R0789 Other chest pain: Secondary | ICD-10-CM

## 2017-04-16 DIAGNOSIS — I10 Essential (primary) hypertension: Secondary | ICD-10-CM | POA: Diagnosis not present

## 2017-04-16 DIAGNOSIS — N950 Postmenopausal bleeding: Secondary | ICD-10-CM | POA: Diagnosis not present

## 2017-04-16 LAB — NM MYOCAR MULTI W/SPECT W/WALL MOTION / EF
CHL CUP MPHR: 166 {beats}/min
CSEPED: 0 min
CSEPEDS: 0 s
CSEPEW: 1 METS
CSEPPHR: 99 {beats}/min
Percent HR: 59 %
Rest HR: 57 {beats}/min

## 2017-04-16 LAB — TROPONIN I: Troponin I: <0.03 ng/mL (ref <0.03)

## 2017-04-16 MED ORDER — SODIUM CHLORIDE 0.9% FLUSH: 3.0000 mL | INTRAVENOUS | Status: DC | PRN

## 2017-04-16 MED ORDER — TECHNETIUM TC 99M TETROFOSMIN IV KIT
30.0000 | PACK | Freq: Once | INTRAVENOUS | Status: AC | PRN
  Administered 2017-04-16: 30 via INTRAVENOUS

## 2017-04-16 MED ORDER — REGADENOSON 0.4 MG/5ML IV SOLN
INTRAVENOUS | Status: AC
  Filled 2017-04-16: qty 5

## 2017-04-16 MED ORDER — SODIUM CHLORIDE 0.9 % IV SOLN
INTRAVENOUS | Status: DC
  Administered 2017-04-17: 05:00:00 via INTRAVENOUS

## 2017-04-16 MED ORDER — SODIUM CHLORIDE 0.9 % IV SOLN: 250.0000 mL | INTRAVENOUS | Status: DC | PRN

## 2017-04-16 MED ORDER — SODIUM CHLORIDE 0.9% FLUSH
3.0000 mL | Freq: Two times a day (BID) | INTRAVENOUS | Status: DC
  Administered 2017-04-16: 3 mL via INTRAVENOUS

## 2017-04-16 MED ORDER — ASPIRIN 81 MG PO CHEW
81.0000 mg | CHEWABLE_TABLET | ORAL | Status: AC
  Administered 2017-04-17: 81 mg via ORAL
  Filled 2017-04-16: qty 1

## 2017-04-16 NOTE — Progress Notes (Signed)
 Progress Note  Patient Name: Regina Morales Date of Encounter: 04/16/2017  Primary Cardiologist: Dr. Nishan  Subjective   56 year old female admitted late last night for chest pain has hx of PAF, CP neg myoview in 2009, GERD, DVT, and PE after childbirth, HTN, and recent polyp with postmenopausal bleeding. Underwent D&C procedure yesterday with no complications. Developed CP in PACU. Neg work up so far. Plan per Dr. Turner is for Nuc Stress this AM, CT angio chest and 2D echo.   The patient had a little bit of chest pain overnight but is feeling well this morning. No SOB or palpitations.  CT angio shows no evidence of PE troponins remain negative ECG remain unremarkable   Inpatient Medications    Scheduled Meds: . amLODipine  2.5 mg Oral QPM  . aspirin EC  81 mg Oral Daily  . atorvastatin  20 mg Oral q1800  . buPROPion  300 mg Oral QHS  . cholecalciferol  1,000 Units Oral Daily  . feeding supplement (ENSURE ENLIVE)  237 mL Oral BID BM  . ferrous sulfate  325 mg Oral Daily  . flecainide  50 mg Oral BID  . imipramine  25 mg Oral QHS  . metoprolol tartrate  25 mg Oral BID  . multivitamin with minerals  1 tablet Oral Daily  . regadenoson  0.4 mg Intravenous Once  . scopolamine  1 patch Transdermal Once  . sodium chloride flush  3 mL Intravenous Q12H   Continuous Infusions: . sodium chloride     PRN Meds: sodium chloride, acetaminophen, ibuprofen, nitroGLYCERIN, ondansetron (ZOFRAN) IV, sodium chloride flush   Vital Signs    Vitals:   04/16/17 0616 04/16/17 0622 04/16/17 0629 04/16/17 0727  BP: (!) 149/99 115/76 105/72 114/64  Pulse: 79 74 61 65  Resp:   16 16  Temp:    98.8 F (37.1 C)  TempSrc:    Oral  SpO2: 100% 100% 98% 100%  Weight:      Height:        Intake/Output Summary (Last 24 hours) at 04/16/17 0825 Last data filed at 04/16/17 0600  Gross per 24 hour  Intake              770 ml  Output             2225 ml  Net            -1455 ml   Filed  Weights   04/14/17 2123 04/15/17 0546 04/16/17 0402  Weight: 298 lb 1 oz (135.2 kg) (!) 302 lb 6.4 oz (137.2 kg) (!) 303 lb (137.4 kg)    Telemetry    NSR, HR 50-60s - Personally Reviewed   Physical Exam   GEN:  , obese female , NAD  HEENT: normal  Neck: no JVD, carotid bruits, or masses Cardiac: RR. no murmurs, rubs, or gallops,no edema. Intact distal pulses bilaterally.  Respiratory: clear to auscultation bilaterally, normal work of breathing GI: soft, nontender, nondistended, + BS MS: no deformity or atrophy  Skin: warm and dry, no rash Neuro: Alert and Oriented x 3, Strength and sensation are intact Psych:   Full affect  Labs    Chemistry  Recent Labs Lab 04/09/17 0930 04/15/17 0037 04/15/17 0654  NA 141 136 136  K 3.9 4.2 4.2  CL 107 104 105  CO2 26 24 21*  GLUCOSE 105* 186* 118*  BUN 17 20 19  CREATININE 0.77 1.03* 0.86  CALCIUM 9.2 9.0 9.0    PROT  --  6.5  --   ALBUMIN  --  3.9  --   AST  --  35  --   ALT  --  42  --   ALKPHOS  --  96  --   BILITOT  --  0.8  --   GFRNONAA >60 >60 >60  GFRAA >60 >60 >60  ANIONGAP 8 8 10     Hematology  Recent Labs Lab 04/14/17 1919 04/15/17 0037 04/15/17 0654  WBC 8.2 7.7 9.6  RBC 3.83* 3.75* 3.96  HGB 10.9* 10.5* 11.2*  HCT 33.3* 32.9* 34.2*  MCV 86.9 87.7 86.4  MCH 28.5 28.0 28.3  MCHC 32.7 31.9 32.7  RDW 13.7 13.4 13.2  PLT 255 270 250    Cardiac Enzymes  Recent Labs Lab 04/15/17 1200 04/15/17 1809 04/16/17 0052 04/16/17 0603  TROPONINI <0.03 <0.03 <0.03 <0.03   No results for input(s): TROPIPOC in the last 168 hours.   BNPNo results for input(s): BNP, PROBNP in the last 168 hours.   DDimer No results for input(s): DDIMER in the last 168 hours.   Radiology    Ct Angio Chest Pe W Or Wo Contrast  Result Date: 04/15/2017 CLINICAL DATA:  Subacute onset of generalized chest pain. Initial encounter. EXAM: CT ANGIOGRAPHY CHEST WITH CONTRAST TECHNIQUE: Multidetector CT imaging of the chest was  performed using the standard protocol during bolus administration of intravenous contrast. Multiplanar CT image reconstructions and MIPs were obtained to evaluate the vascular anatomy. CONTRAST:  100 mL of Isovue 370 IV contrast COMPARISON:  Chest radiograph performed 04/17/2016, and CTA of the chest performed 04/26/2015 FINDINGS: Cardiovascular:  There is no evidence of pulmonary embolus. The heart is normal in size. The thoracic aorta is grossly unremarkable. The great vessels are grossly unremarkable in appearance, though not well assessed due to artifact from the contrast bolus. Incidental note is made of a direct origin of the left vertebral artery from the aortic arch. Mediastinum/Nodes: The mediastinum is unremarkable in appearance. No mediastinal lymphadenopathy is seen. No pericardial effusion is identified. The visualized portions of the thyroid gland are unremarkable. No axillary lymphadenopathy is appreciated. Lungs/Pleura: Bilateral dependent subsegmental atelectasis is noted. No pleural effusion or pneumothorax is seen. No masses are identified. Upper Abdomen: The visualized portions of the liver and spleen are grossly unremarkable. Musculoskeletal: No acute osseous abnormalities are identified. The visualized musculature is unremarkable in appearance. Review of the MIP images confirms the above findings. IMPRESSION: 1. No evidence of pulmonary embolus. 2. Bilateral dependent subsegmental atelectasis noted. Lungs otherwise clear. Electronically Signed   By: Jeffery  Chang M.D.   On: 04/15/2017 01:50    Cardiac Studies   Echo and myoview pending this admission.  Patient Profile     56 year old female admitted late last night for chest pain has hx of PAF, CP neg myoview in 2009, GERD, DVT, and PE after childbirth, HTN, and recent polyp with postmenopausal bleeding. Underwent D&C procedure yesterday with no complications. Developed CP in PACU. Neg work up so far. Plan per Dr. Turner is for Nuc  Stress this AM, CT angio chest and 2D echo.  Assessment & Plan    1. Chest pain: negative cardiac enzymes and non ischemic EKG. Not currently in atrial fibrillation and has been maintaining SR.  2nd part of Lexiscan myoview today  Echo to be done  . CT angio chest has ruled out PE.  2. Persistent atrial fibrillation: Has not had any recent palpitations, in NSR on   tele.  -- cont Flecainide. Her CHADS2VASC score is 2 (female and HTN) and has not been on anticoagulation.  This will need to be addressed by her primary Cardiologist.  Would not start NOAC currently as she is just s/p D&C for endometrial polyp.  3. Hypertension: Well controlled on home meds.  4. Endometrial polyp:  s/p D&C earlier 04/14/2017.  Further treatment regarding this per GYN.  Hbg stable at 10.9   I anticipate that she will be able to be discharged today if the myoview is low risk    Meigan Pates, MD  04/16/2017 8:30 AM    [facility] [address],  [address] [place], [place]  [address] Pager [phone] Phone: [phone]; Fax: [phone]     

## 2017-04-16 NOTE — Progress Notes (Signed)
Pt states that her vaginal bleeding had stopped earlier today, but now has restarted and is heavier.  She also reports lower abdominal pain while urinating, which is new.  PRN ibuprofen has already been given tonight.  Dr. Alberteen SpindleFalk notified of the above via text page.  Will continue to monitor.  Alonza Bogusuvall, Chizara Mena Gray

## 2017-04-16 NOTE — Progress Notes (Signed)
At 0615 Patient c/o 8/10 mid chest pressure. BP was 149/99. Patient was put on 2L O2. 1 nitro was given. At 0621 chest pain was a 4. BP 115/76 another nitro was given. At 0628 chest pain was a 2. BP 105/72. 3rd nitro was given. EKG was obtained. Chest pressure was a 1 and then it resolved. Will continue to monitor closely.

## 2017-04-16 NOTE — Plan of Care (Deleted)
Problem: Physical Regulation: Goal: Ability to maintain clinical measurements within normal limits will improve Outcome: Progressing Remains on glucostabilizer, but blood sugars have been within target range (140-180) for 3 consecutive readings.

## 2017-04-16 NOTE — Progress Notes (Signed)
   Jhoselin A SwazilandJordan presented for a [employer]isc[employer] cardiolite today.  No immediate complications.  Stress imaging is pending at this time.  Ellsworth LennoxBrittany M Taci Sterling, PA-C 04/16/2017, 10:21 AM

## 2017-04-16 NOTE — Progress Notes (Signed)
  Stress Test showed:   There was no ST segment deviation noted during stress.  No T wave inversion was noted during stress.  Defect 1: There is a medium defect of moderate severity present in the mid anteroseptal, apical anterior and apical lateral location.  This is an intermediate risk study.  Findings consistent with ischemia.  Nuclear stress EF: 55%.  Abnormal study. Sensitivity and specificity is reduced by body habitus.    Reviewed the results and images with Dr. Mayford Knifeurner. Will plan for definitive evaluation with a cardiac catheterization tomorrow. The patient understands that risks include but are not limited to stroke (1 in 1000), death (1 in 1000), kidney failure [usually temporary] (1 in 500), bleeding (1 in 200), allergic reaction [possibly serious] (1 in 200). Scheduled for 04/17/2017 at 0730 with Dr. Eldridge DaceVaranasi.   Signed, Ellsworth LennoxBrittany M Strader, PA-C 04/16/2017, 3:58 PM Pager: [phone]

## 2017-04-17 ENCOUNTER — Encounter (HOSPITAL_COMMUNITY)
Admission: RE | Disposition: A | Discharge status: Home / Self Care | Payer: Self-pay | Source: Ambulatory Visit | Attending: Cardiovascular Disease

## 2017-04-17 ENCOUNTER — Observation Stay (HOSPITAL_BASED_OUTPATIENT_CLINIC_OR_DEPARTMENT_OTHER): Payer: Managed Care, Other (non HMO)

## 2017-04-17 DIAGNOSIS — I10 Essential (primary) hypertension: Secondary | ICD-10-CM | POA: Diagnosis not present

## 2017-04-17 DIAGNOSIS — R9439 Abnormal result of other cardiovascular function study: Secondary | ICD-10-CM | POA: Diagnosis not present

## 2017-04-17 DIAGNOSIS — I360 Nonrheumatic tricuspid (valve) stenosis: Secondary | ICD-10-CM | POA: Diagnosis not present

## 2017-04-17 DIAGNOSIS — N950 Postmenopausal bleeding: Secondary | ICD-10-CM | POA: Diagnosis not present

## 2017-04-17 DIAGNOSIS — R0789 Other chest pain: Secondary | ICD-10-CM | POA: Diagnosis not present

## 2017-04-17 HISTORY — PX: LEFT HEART CATH AND CORONARY ANGIOGRAPHY: CATH118249

## 2017-04-17 LAB — ECHOCARDIOGRAM COMPLETE
E decel time: 225 msec
EERAT: 5.11
FS: 32 % (ref 28–44)
Height: 67 in
IV/PV OW: 0.98
LA diam end sys: 39 mm
LA diam index: 1.62 cm/m2
LA vol A4C: 36.9 ml
LA vol index: 18 mL/m2
LA vol: 43.5 mL
LASIZE: 39 mm
LDCA: 3.8 cm2
LV E/e'average: 5.11
LV PW d: 10.2 mm — AB (ref 0.6–1.1)
LVEEMED: 5.11
LVELAT: 15.6 cm/s
LVOT VTI: 19.5 cm
LVOT diameter: 22 mm
LVOT peak grad rest: 4 mmHg
LVOTPV: 99.2 cm/s
LVOTSV: 74 mL
MV Dec: 225
MV pk A vel: 66.8 m/s
MV pk E vel: 79.7 m/s
MVPG: 3 mmHg
RV LATERAL S' VELOCITY: 13.3 cm/s
RV TAPSE: 27 mm
TDI e' lateral: 15.6
TDI e' medial: 16
Weight: 4848 oz

## 2017-04-17 SURGERY — LEFT HEART CATH AND CORONARY ANGIOGRAPHY
Anesthesia: LOCAL

## 2017-04-17 MED ORDER — HEPARIN SODIUM (PORCINE) 1000 UNIT/ML IJ SOLN
INTRAMUSCULAR | Status: AC
  Filled 2017-04-17: qty 1

## 2017-04-17 MED ORDER — ACETAMINOPHEN 325 MG PO TABS: 650.0000 mg | ORAL_TABLET | ORAL | Status: DC | PRN

## 2017-04-17 MED ORDER — HEPARIN (PORCINE) IN NACL 2-0.9 UNIT/ML-% IJ SOLN
INTRAMUSCULAR | Status: AC | PRN
Start: 2017-04-17 — End: 2017-04-17
  Administered 2017-04-17: 1000 mL

## 2017-04-17 MED ORDER — ONDANSETRON HCL 4 MG/2ML IJ SOLN: 4.0000 mg | Freq: Four times a day (QID) | INTRAMUSCULAR | Status: DC | PRN

## 2017-04-17 MED ORDER — FENTANYL CITRATE (PF) 100 MCG/2ML IJ SOLN
INTRAMUSCULAR | Status: DC | PRN
  Administered 2017-04-17: 25 ug via INTRAVENOUS

## 2017-04-17 MED ORDER — HEPARIN SODIUM (PORCINE) 1000 UNIT/ML IJ SOLN
INTRAMUSCULAR | Status: DC | PRN
  Administered 2017-04-17: 6000 [IU] via INTRAVENOUS

## 2017-04-17 MED ORDER — VERAPAMIL HCL 2.5 MG/ML IV SOLN
INTRAVENOUS | Status: AC
  Filled 2017-04-17: qty 2

## 2017-04-17 MED ORDER — LIDOCAINE HCL (PF) 1 % IJ SOLN
INTRAMUSCULAR | Status: DC | PRN
  Administered 2017-04-17: 2 mL

## 2017-04-17 MED ORDER — SODIUM CHLORIDE 0.9% FLUSH
3.0000 mL | Freq: Two times a day (BID) | INTRAVENOUS | Status: DC
  Administered 2017-04-17: 3 mL via INTRAVENOUS

## 2017-04-17 MED ORDER — MIDAZOLAM HCL 2 MG/2ML IJ SOLN
INTRAMUSCULAR | Status: DC | PRN
  Administered 2017-04-17: 2 mg via INTRAVENOUS

## 2017-04-17 MED ORDER — METOPROLOL TARTRATE 25 MG PO TABS: 25.0000 mg | ORAL_TABLET | Freq: Two times a day (BID) | ORAL | 9 refills | Status: DC

## 2017-04-17 MED ORDER — SODIUM CHLORIDE 0.9% FLUSH: 3.0000 mL | INTRAVENOUS | Status: DC | PRN

## 2017-04-17 MED ORDER — ASPIRIN 81 MG PO TBEC: 81.0000 mg | DELAYED_RELEASE_TABLET | Freq: Every day | ORAL | Status: DC

## 2017-04-17 MED ORDER — TRAMADOL HCL 50 MG PO TABS
50.0000 mg | ORAL_TABLET | Freq: Once | ORAL | Status: AC
  Administered 2017-04-17: 50 mg via ORAL
  Filled 2017-04-17: qty 1

## 2017-04-17 MED ORDER — LIDOCAINE HCL (PF) 1 % IJ SOLN
INTRAMUSCULAR | Status: AC
  Filled 2017-04-17: qty 30

## 2017-04-17 MED ORDER — AMLODIPINE BESYLATE 2.5 MG PO TABS: 2.5000 mg | ORAL_TABLET | Freq: Every evening | ORAL | 9 refills | Status: DC

## 2017-04-17 MED ORDER — VERAPAMIL HCL 2.5 MG/ML IV SOLN
INTRAVENOUS | Status: DC | PRN
  Administered 2017-04-17: 10 mL via INTRA_ARTERIAL

## 2017-04-17 MED ORDER — IOPAMIDOL (ISOVUE-370) INJECTION 76%
INTRAVENOUS | Status: DC | PRN
  Administered 2017-04-17: 50 mL via INTRA_ARTERIAL

## 2017-04-17 MED ORDER — FENTANYL CITRATE (PF) 100 MCG/2ML IJ SOLN
INTRAMUSCULAR | Status: AC
  Filled 2017-04-17: qty 2

## 2017-04-17 MED ORDER — FLECAINIDE ACETATE 50 MG PO TABS: 50.0000 mg | ORAL_TABLET | Freq: Two times a day (BID) | ORAL | 6 refills | Status: DC

## 2017-04-17 MED ORDER — HEPARIN (PORCINE) IN NACL 2-0.9 UNIT/ML-% IJ SOLN
INTRAMUSCULAR | Status: AC
  Filled 2017-04-17: qty 1000

## 2017-04-17 MED ORDER — IOPAMIDOL (ISOVUE-370) INJECTION 76%
INTRAVENOUS | Status: AC
  Filled 2017-04-17: qty 100

## 2017-04-17 MED ORDER — ASPIRIN 81 MG PO TBEC: 81.0000 mg | DELAYED_RELEASE_TABLET | Freq: Every day | ORAL | 9 refills | Status: DC

## 2017-04-17 MED ORDER — ATORVASTATIN CALCIUM 20 MG PO TABS: 20.0000 mg | ORAL_TABLET | Freq: Every day | ORAL | 9 refills | Status: DC

## 2017-04-17 MED ORDER — SODIUM CHLORIDE 0.9 % IV SOLN: 250.0000 mL | INTRAVENOUS | Status: DC | PRN

## 2017-04-17 MED ORDER — MIDAZOLAM HCL 2 MG/2ML IJ SOLN
INTRAMUSCULAR | Status: AC
  Filled 2017-04-17: qty 2

## 2017-04-17 SURGICAL SUPPLY — 11 items
CATH 5FR JL3.5 JR4 ANG PIG MP (CATHETERS) ×2 IMPLANT
DEVICE RAD COMP TR BAND LRG (VASCULAR PRODUCTS) ×2 IMPLANT
GLIDESHEATH SLEND SS 6F .021 (SHEATH) ×2 IMPLANT
GUIDEWIRE INQWIRE 1.5J.035X260 (WIRE) ×1 IMPLANT
HOVERMATT SINGLE USE (MISCELLANEOUS) ×2 IMPLANT
INQWIRE 1.5J .035X260CM (WIRE) ×2
KIT HEART LEFT (KITS) ×2 IMPLANT
PACK CARDIAC CATHETERIZATION (CUSTOM PROCEDURE TRAY) ×2 IMPLANT
SYR MEDRAD MARK V 150ML (SYRINGE) ×2 IMPLANT
TRANSDUCER W/STOPCOCK (MISCELLANEOUS) ×2 IMPLANT
TUBING CIL FLEX 10 FLL-RA (TUBING) ×2 IMPLANT

## 2017-04-17 NOTE — Progress Notes (Signed)
Dc instructions given to pt.  Pt verbalized understanding of all instructions.  No s/s of any acute distress.  Pain "tolerable"

## 2017-04-17 NOTE — H&P (View-Only) (Signed)
Progress Note  Patient Name: Regina Morales Date of Encounter: 04/16/2017  Primary Cardiologist: Dr. Eden Emms  Subjective   56 year old female admitted late last night for chest pain has hx of PAF, CP neg myoview in 2009, GERD, DVT, and PE after childbirth, HTN, and recent polyp with postmenopausal bleeding. Underwent D&C procedure yesterday with no complications. Developed CP in PACU. Neg work up so far. Plan per Dr. Mayford Knife is for Nuc Stress this AM, CT angio chest and 2D echo.   The patient had a little bit of chest pain overnight but is feeling well this morning. No SOB or palpitations.  CT angio shows no evidence of PE troponins remain negative ECG remain unremarkable   Inpatient Medications    Scheduled Meds: . amLODipine  2.5 mg Oral QPM  . aspirin EC  81 mg Oral Daily  . atorvastatin  20 mg Oral q1800  . buPROPion  300 mg Oral QHS  . cholecalciferol  1,000 Units Oral Daily  . feeding supplement (ENSURE ENLIVE)  237 mL Oral BID BM  . ferrous sulfate  325 mg Oral Daily  . flecainide  50 mg Oral BID  . imipramine  25 mg Oral QHS  . metoprolol tartrate  25 mg Oral BID  . multivitamin with minerals  1 tablet Oral Daily  . regadenoson  0.4 mg Intravenous Once  . scopolamine  1 patch Transdermal Once  . sodium chloride flush  3 mL Intravenous Q12H   Continuous Infusions: . sodium chloride     PRN Meds: sodium chloride, acetaminophen, ibuprofen, nitroGLYCERIN, ondansetron (ZOFRAN) IV, sodium chloride flush   Vital Signs    Vitals:   04/16/17 0616 04/16/17 0622 04/16/17 0629 04/16/17 0727  BP: (!) 149/99 115/76 105/72 114/64  Pulse: 79 74 61 65  Resp:   16 16  Temp:    98.8 F (37.1 C)  TempSrc:    Oral  SpO2: 100% 100% 98% 100%  Weight:      Height:        Intake/Output Summary (Last 24 hours) at 04/16/17 0825 Last data filed at 04/16/17 0600  Gross per 24 hour  Intake              770 ml  Output             2225 ml  Net            -1455 ml   Filed  Weights   04/14/17 2123 04/15/17 0546 04/16/17 0402  Weight: 298 lb 1 oz (135.2 kg) (!) 302 lb 6.4 oz (137.2 kg) (!) 303 lb (137.4 kg)    Telemetry    NSR, HR 50-60s - Personally Reviewed   Physical Exam   GEN:  , obese female , NAD  HEENT: normal  Neck: no JVD, carotid bruits, or masses Cardiac: RR. no murmurs, rubs, or gallops,no edema. Intact distal pulses bilaterally.  Respiratory: clear to auscultation bilaterally, normal work of breathing GI: soft, nontender, nondistended, + BS MS: no deformity or atrophy  Skin: warm and dry, no rash Neuro: Alert and Oriented x 3, Strength and sensation are intact Psych:   Full affect  Labs    Chemistry  Recent Labs Lab 04/09/17 0930 04/15/17 0037 04/15/17 0654  NA 141 136 136  K 3.9 4.2 4.2  CL 107 104 105  CO2 26 24 21*  GLUCOSE 105* 186* 118*  BUN 17 20 19   CREATININE 0.77 1.03* 0.86  CALCIUM 9.2 9.0 9.0  PROT  --  6.5  --   ALBUMIN  --  3.9  --   AST  --  35  --   ALT  --  42  --   ALKPHOS  --  96  --   BILITOT  --  0.8  --   GFRNONAA >60 >60 >60  GFRAA >60 >60 >60  ANIONGAP 8 8 10      Hematology  Recent Labs Lab 04/14/17 1919 04/15/17 0037 04/15/17 0654  WBC 8.2 7.7 9.6  RBC 3.83* 3.75* 3.96  HGB 10.9* 10.5* 11.2*  HCT 33.3* 32.9* 34.2*  MCV 86.9 87.7 86.4  MCH 28.5 28.0 28.3  MCHC 32.7 31.9 32.7  RDW 13.7 13.4 13.2  PLT 255 270 250    Cardiac Enzymes  Recent Labs Lab 04/15/17 1200 04/15/17 1809 04/16/17 0052 04/16/17 0603  TROPONINI <0.03 <0.03 <0.03 <0.03   No results for input(s): TROPIPOC in the last 168 hours.   BNPNo results for input(s): BNP, PROBNP in the last 168 hours.   DDimer No results for input(s): DDIMER in the last 168 hours.   Radiology    Ct Angio Chest Pe W Or Wo Contrast  Result Date: 04/15/2017 CLINICAL DATA:  Subacute onset of generalized chest pain. Initial encounter. EXAM: CT ANGIOGRAPHY CHEST WITH CONTRAST TECHNIQUE: Multidetector CT imaging of the chest was  performed using the standard protocol during bolus administration of intravenous contrast. Multiplanar CT image reconstructions and MIPs were obtained to evaluate the vascular anatomy. CONTRAST:  100 mL of Isovue 370 IV contrast COMPARISON:  Chest radiograph performed 04/17/2016, and CTA of the chest performed 04/26/2015 FINDINGS: Cardiovascular:  There is no evidence of pulmonary embolus. The heart is normal in size. The thoracic aorta is grossly unremarkable. The great vessels are grossly unremarkable in appearance, though not well assessed due to artifact from the contrast bolus. Incidental note is made of a direct origin of the left vertebral artery from the aortic arch. Mediastinum/Nodes: The mediastinum is unremarkable in appearance. No mediastinal lymphadenopathy is seen. No pericardial effusion is identified. The visualized portions of the thyroid gland are unremarkable. No axillary lymphadenopathy is appreciated. Lungs/Pleura: Bilateral dependent subsegmental atelectasis is noted. No pleural effusion or pneumothorax is seen. No masses are identified. Upper Abdomen: The visualized portions of the liver and spleen are grossly unremarkable. Musculoskeletal: No acute osseous abnormalities are identified. The visualized musculature is unremarkable in appearance. Review of the MIP images confirms the above findings. IMPRESSION: 1. No evidence of pulmonary embolus. 2. Bilateral dependent subsegmental atelectasis noted. Lungs otherwise clear. Electronically Signed   By: Roanna RaiderJeffery  Chang M.D.   On: 04/15/2017 01:50    Cardiac Studies   Echo and myoview pending this admission.  Patient Profile     56 year old female admitted late last night for chest pain has hx of PAF, CP neg myoview in 2009, GERD, DVT, and PE after childbirth, HTN, and recent polyp with postmenopausal bleeding. Underwent D&C procedure yesterday with no complications. Developed CP in PACU. Neg work up so far. Plan per Dr. Mayford Knifeurner is for Nuc  Stress this AM, CT angio chest and 2D echo.  Assessment & Plan    1. Chest pain: negative cardiac enzymes and non ischemic EKG. Not currently in atrial fibrillation and has been maintaining SR.  2nd part of Lexiscan myoview today  Echo to be done  . CT angio chest has ruled out PE.  2. Persistent atrial fibrillation: Has not had any recent palpitations, in NSR on  tele.  -- cont Flecainide. Her CHADS2VASC score is 2 (female and HTN) and has not been on anticoagulation.  This will need to be addressed by her primary Cardiologist.  Would not start NOAC currently as she is just s/p D&C for endometrial polyp.  3. Hypertension: Well controlled on home meds.  4. Endometrial polyp:  s/p D&C earlier 04/14/2017.  Further treatment regarding this per GYN.  Hbg stable at 10.9   I anticipate that she will be able to be discharged today if the myoview is low risk    Kristeen Miss, MD  04/16/2017 8:30 AM    [facility] [address] [place],  [address] [place], [place]  [address] Pager [phone] Phone: [phone]; Fax: [phone]

## 2017-04-17 NOTE — Progress Notes (Signed)
Pt tolerated TR band removal well. Site is a level 0. Education & handout given to pt. Will continue to monitor the pt. Sanda LingerMilam, Aaradhya Kysar R, RN

## 2017-04-17 NOTE — Progress Notes (Signed)
PA on call notified of pt c/o of increased bleeding overnight as well as pain with urination. Pt already given ibuprofen with little relief. New orders for tramadol. Per pt bleeding had stopped but restarted last night & it is slowing down but when she urinated the cramping increases in her pelvis & it burns. Will continue to monitor th e pt Corwin Kuiken, Colletta MarylandJalesa R, RN

## 2017-04-17 NOTE — Progress Notes (Signed)
  Echocardiogram 2D Echocardiogram has been performed.  Delcie RochENNINGTON, Naijah Lacek 04/17/2017, 4:24 PM

## 2017-04-17 NOTE — Interval H&P Note (Signed)
Cath Lab Visit (complete for each Cath Lab visit)  Clinical Evaluation Leading to the Procedure:   ACS: Yes.    Non-ACS:    Anginal Classification: CCS IV  Anti-ischemic medical therapy: Minimal Therapy (1 class of medications)  Non-Invasive Test Results: Intermediate-risk stress test findings: cardiac mortality 1-3%/year  Prior CABG: No previous CABG  Vaginal bleeding post op, may only do diagnostic.    History and Physical Interval Note:  04/17/2017 7:31 AM  Regina Morales  has presented today for surgery, with the diagnosis of abnormal stress  The various methods of treatment have been discussed with the patient and family. After consideration of risks, benefits and other options for treatment, the patient has consented to  Procedure(s): Left Heart Cath and Coronary Angiography (N/A) as a surgical intervention .  The patient's history has been reviewed, patient examined, no change in status, stable for surgery.  I have reviewed the patient's chart and labs.  Questions were answered to the patient's satisfaction.     Lance MussJayadeep Mckensey Berghuis

## 2017-04-17 NOTE — Discharge Summary (Signed)
Discharge Summary    Patient ID: Regina Morales,  MRN: [id number], DOB/AGE: 1961/05/06 56 y.o.  Admit date: 04/14/2017 Discharge date: 04/17/2017  Primary Care Provider: Clovis Riley, L.Dean Primary Cardiologist: Dr. Eden Emms  Discharge Diagnoses    Principal Problem:   Atypical chest pain Active Problems:   PAF (paroxysmal atrial fibrillation) (HCC)   Essential hypertension   Endometrial polyp   Persistent atrial fibrillation (HCC)   Abnormal nuclear stress test   Allergies Allergies  Allergen Reactions  . Codeine Nausea And Vomiting     History of Present Illness     The patient is a 56 year old female admitted for chest pain with a history of paroxysmal atria fibrillation, CP, neg myoview in 2009, GERD, DVT and PE after childbirth, hypertension, and recent polyp with postmenopausal bleeding. She had a D&C procedure done day of admission and developed CP in the PACU.  Hospital Course     Consultants: None  She was transferred to [facility] for cardiology evaluation for her chest pain. She reported that she was feeling short of breath and a pain that worsened with breathing that started when she woke in the PACU. The pain was severe and she was nauseated. She had been having some exertional CP for the last few weeks that improved with SL nitro at home. She had negative cardiac enzymes,  EKG NSR, AT angio ruled out PE, normal 2D echo. She had a Nuclear stress test which was intermediate and the decision was made for cardiac cath and had clean cors and normal LV function. Recommendation to diurese and lifestyle modifications. She has a history of persistent atrial fibrillation but remained in sinus rhythm this admission, continue Flecainide. CHADVASC is 2 (HTN, female). Plan to not start OAC at this point because she has had surgery. She has not been on [facility], it is unclear why, but this will be left up to Dr. Eden Emms when seen in clinic. Her BP is well controlled on home meds so  she will continue these. She did have some spotting and cramping to her suprapubic region which is to be expected after her procedure. She will follow-up with her OB/Gyn regarding this. Hbg stable 10.9 during this admission.   The patient has had an uncomplicated hospital course and is recovering well. The right radial catheter site is stable. She has been seen by Dr. Eden Emms today and deemed ready for discharge home. All follow-up appointments have been scheduled. . A work excuse note was provided as well. Discharge medications are listed below.  _____________  Discharge Vitals Blood pressure 133/76, pulse 67, temperature 98.5 F (36.9 C), temperature source Oral, resp. rate 17, height 5\' 7"  (1.702 m), weight (!) 303 lb (137.4 kg), SpO2 100 %.  Filed Weights   04/14/17 2123 04/15/17 0546 04/16/17 0402  Weight: 298 lb 1 oz (135.2 kg) (!) 302 lb 6.4 oz (137.2 kg) (!) 303 lb (137.4 kg)    Labs & Radiologic Studies     CBC  Recent Labs  04/15/17 0037 04/15/17 0654  WBC 7.7 9.6  NEUTROABS 6.9  --   HGB 10.5* 11.2*  HCT 32.9* 34.2*  MCV 87.7 86.4  PLT 270 250   Basic Metabolic Panel  Recent Labs  04/15/17 0037 04/15/17 0654  NA 136 136  K 4.2 4.2  CL 104 105  CO2 24 21*  GLUCOSE 186* 118*  BUN 20 19  CREATININE 1.03* 0.86  CALCIUM 9.0 9.0  MG 2.0  --  Liver Function Tests  Recent Labs  04/15/17 0037  AST 35  ALT 42  ALKPHOS 96  BILITOT 0.8  PROT 6.5  ALBUMIN 3.9   No results for input(s): LIPASE, AMYLASE in the last 72 hours. Cardiac Enzymes  Recent Labs  04/14/17 1919  04/15/17 1809 04/16/17 0052 04/16/17 0603  CKTOTAL 231  --   --   --   --   CKMB 4.0  --   --   --   --   TROPONINI <0.03  < > <0.03 <0.03 <0.03  < > = values in this interval not displayed. BNP Invalid input(s): POCBNP D-Dimer No results for input(s): DDIMER in the last 72 hours. Hemoglobin A1C No results for input(s): HGBA1C in the last 72 hours. Fasting Lipid Panel No  results for input(s): CHOL, HDL, LDLCALC, TRIG, CHOLHDL, LDLDIRECT in the last 72 hours. Thyroid Function Tests  Recent Labs  04/15/17 0038  TSH 0.606    Ct Angio Chest Pe W Or Wo Contrast  Result Date: 04/15/2017 CLINICAL DATA:  Subacute onset of generalized chest pain. Initial encounter. EXAM: CT ANGIOGRAPHY CHEST WITH CONTRAST TECHNIQUE: Multidetector CT imaging of the chest was performed using the standard protocol during bolus administration of intravenous contrast. Multiplanar CT image reconstructions and MIPs were obtained to evaluate the vascular anatomy. CONTRAST:  100 mL of Isovue 370 IV contrast COMPARISON:  Chest radiograph performed 04/17/2016, and CTA of the chest performed 04/26/2015 FINDINGS: Cardiovascular:  There is no evidence of pulmonary embolus. The heart is normal in size. The thoracic aorta is grossly unremarkable. The great vessels are grossly unremarkable in appearance, though not well assessed due to artifact from the contrast bolus. Incidental note is made of a direct origin of the left vertebral artery from the aortic arch. Mediastinum/Nodes: The mediastinum is unremarkable in appearance. No mediastinal lymphadenopathy is seen. No pericardial effusion is identified. The visualized portions of the thyroid gland are unremarkable. No axillary lymphadenopathy is appreciated. Lungs/Pleura: Bilateral dependent subsegmental atelectasis is noted. No pleural effusion or pneumothorax is seen. No masses are identified. Upper Abdomen: The visualized portions of the liver and spleen are grossly unremarkable. Musculoskeletal: No acute osseous abnormalities are identified. The visualized musculature is unremarkable in appearance. Review of the MIP images confirms the above findings. IMPRESSION: 1. No evidence of pulmonary embolus. 2. Bilateral dependent subsegmental atelectasis noted. Lungs otherwise clear. Electronically Signed   By: Roanna Raider M.D.   On: 04/15/2017 01:50   Nm Myocar  Multi W/spect W/wall Motion / Ef  Result Date: 04/16/2017  There was no ST segment deviation noted during stress.  No T wave inversion was noted during stress.  Defect 1: There is a medium defect of moderate severity present in the mid anteroseptal, apical anterior and apical lateral location.  This is an intermediate risk study.  Findings consistent with ischemia.  Nuclear stress EF: 55%.  Abnormal study. Sensitivity and specificity is reduced by body habitus.  Donato Schultz, MD     Diagnostic Studies/Procedures     04/17/2017 Left Heart Cath and Coronary Angiography  Conclusion     The left ventricular systolic function is normal.  LV end diastolic pressure is moderately elevated. LVEDP 25 mm Hg.  The left ventricular ejection fraction is 50-55% by visual estimate.  There is no aortic valve stenosis.  No angiographically apparent coronary artery disease.   Consider diuresis.  Continue preventive therapy.    04/17/2017 Cardiac catheterization  Procedures   Left Heart Cath and Coronary Angiography  Conclusion     The left ventricular systolic function is normal.  LV end diastolic pressure is moderately elevated. LVEDP 25 mm Hg.  The left ventricular ejection fraction is 50-55% by visual estimate.  There is no aortic valve stenosis.  No angiographically apparent coronary artery disease.   Consider diuresis.  Continue preventive therapy.     _____________    Disposition   Pt is being discharged home today in good condition.  Follow-up Plans & Appointments    Follow-up Information    Myna HidalgoOzan, Jennifer, DO Follow up in 2 week(s).   Specialty:  Obstetrics and Gynecology Contact information: [address] [place] [address] [phone]        [facility] Follow up.   Why:  The office will call you in the next few days to schedule a follow-up appointment. If you do not hear from  them please call their office Tuesday. Contact information: [address] [place] [phone]         Discharge Instructions    Diet - low sodium heart healthy    Complete by:  As directed    Increase activity slowly    Complete by:  As directed    May shower / Bathe    Complete by:  As directed       Discharge Medications   Allergies as of 04/17/2017      Reactions   Codeine Nausea And Vomiting      Medication List    TAKE these medications   amLODipine 2.5 MG tablet Commonly known as:  NORVASC Take 2.5 mg by mouth every evening. What changed:  Another medication with the same name was added. Make sure you understand how and when to take each.   amLODipine 2.5 MG tablet Commonly known as:  NORVASC Take 1 tablet (2.5 mg total) by mouth every evening. Start taking on:  04/18/2017 What changed:  You were already taking a medication with the same name, and this prescription was added. Make sure you understand how and when to take each.   aspirin 81 MG EC tablet Take 1 tablet (81 mg total) by mouth daily. What changed:  Another medication with the same name was added. Make sure you understand how and when to take each.   aspirin 81 MG EC tablet Take 1 tablet (81 mg total) by mouth daily. Start taking on:  04/18/2017 What changed:  You were already taking a medication with the same name, and this prescription was added. Make sure you understand how and when to take each.   atorvastatin 20 MG tablet Commonly known as:  LIPITOR Take 20 mg by mouth daily. What changed:  Another medication with the same name was added. Make sure you understand how and when to take each.   atorvastatin 20 MG tablet Commonly known as:  LIPITOR Take 1 tablet (20 mg total) by mouth daily at 6 PM. Start taking on:  04/18/2017 What changed:  You were already taking a medication with the same name, and this prescription was added. Make sure you  understand how and when to take each.   buPROPion 300 MG 24 hr tablet Commonly known as:  WELLBUTRIN XL Take 300 mg by mouth at bedtime.   butalbital-acetaminophen-caffeine 50-325-40 MG tablet Commonly known as:  FIORICET, ESGIC Take 1 tablet by mouth 2 (two) times daily as needed for migraine.   cholecalciferol 1000 units tablet Commonly known as:  VITAMIN D Take 1,000 Units by mouth daily.   ferrous sulfate 325 (65 FE) MG EC tablet Take 325 mg by mouth daily.   flecainide 50 MG tablet Commonly known as:  TAMBOCOR TAKE 1 TABLET TWICE A DAY What changed:  Another medication with the same name was added. Make sure you understand how and when to take each.   flecainide 50 MG tablet Commonly known as:  TAMBOCOR Take 1 tablet (50 mg total) by mouth 2 (two) times daily. What changed:  You were already taking a medication with the same name, and this prescription was added. Make sure you understand how and when to take each.   ibuprofen 200 MG tablet Commonly known as:  ADVIL,MOTRIN Take 800 mg by mouth every 8 (eight) hours as needed for moderate pain.   imipramine 25 MG tablet Commonly known as:  TOFRANIL Take 25 mg by mouth at bedtime.   metoprolol tartrate 25 MG tablet Commonly known as:  LOPRESSOR TAKE 1 TABLET TWICE A DAY What changed:  Another medication with the same name was added. Make sure you understand how and when to take each.   metoprolol tartrate 25 MG tablet Commonly known as:  LOPRESSOR Take 1 tablet (25 mg total) by mouth 2 (two) times daily. What changed:  You were already taking a medication with the same name, and this prescription was added. Make sure you understand how and when to take each.   multivitamin with minerals tablet Take 1 tablet by mouth daily. 50 + Vitamin   nitroGLYCERIN 0.4 MG SL tablet Commonly known as:  NITROSTAT Place 1 tablet (0.4 mg total) under the tongue every 5 (five) minutes as needed for chest pain (hold for SBP < 110).          Outstanding Labs/Studies   None  Duration of Discharge Encounter   Greater than 30 minutes including physician time.  Suan Halter PA-C 04/17/2017, 6:38 PM   Attending Note:   The patient was seen and examined.  Agree with assessment and plan as noted above.  Changes made to the above note as needed.  Patient seen and independently examined with tiffany Greene, {PA .   We discussed all aspects of the encounter. I agree with the assessment and plan as stated above.  1. Chest pain Ruled out for MI. myoview was abnormal .   Subsequent cath showed no significant CAD DC to home   2.  Hx of paroxysmal atrial fib:  Not on OAC and was not prior to this hospitalization Just had hysterectomy.   Will wait and defer to Dr. Eden Emms about [facility]   I have spent a total of 40 minutes with patient reviewing hospital  notes , telemetry, EKGs, labs and examining patient as well as establishing an assessment and plan that was discussed with the patient. > 50% of time was spent in direct patient care.    Vesta Mixer, Montez Hageman., MD, [facility] 04/18/2017, 5:03 PM [address],  [address] Office [phone] Pager [phone]

## 2017-04-17 NOTE — Progress Notes (Signed)
Progress Note  Patient Name: Regina Morales Date of Encounter: 04/17/2017  Primary Cardiologist: Dr. Eden Emms  Subjective   56 year old female admitted late last night for chest pain has hx of PAF, CP neg myoview in 2009, GERD, DVT, and PE after childbirth, HTN, and recent polyp with postmenopausal bleeding. Underwent D&C procedure yesterday with no complications. Developed CP in PACU. Neg work up so far    The patient had a little bit of chest pain overnight but is feeling well this morning. No SOB or palpitations.  CT angio shows no evidence of PE troponins remain negative ECG remain unremarkable  Cath this am shows normal LV function    Inpatient Medications    Scheduled Meds: . amLODipine  2.5 mg Oral QPM  . aspirin EC  81 mg Oral Daily  . atorvastatin  20 mg Oral q1800  . buPROPion  300 mg Oral QHS  . cholecalciferol  1,000 Units Oral Daily  . feeding supplement (ENSURE ENLIVE)  237 mL Oral BID BM  . ferrous sulfate  325 mg Oral Daily  . flecainide  50 mg Oral BID  . imipramine  25 mg Oral QHS  . metoprolol tartrate  25 mg Oral BID  . multivitamin with minerals  1 tablet Oral Daily  . regadenoson  0.4 mg Intravenous Once  . sodium chloride flush  3 mL Intravenous Q12H  . sodium chloride flush  3 mL Intravenous Q12H   Continuous Infusions: . sodium chloride    . sodium chloride     PRN Meds: sodium chloride, sodium chloride, acetaminophen, ibuprofen, nitroGLYCERIN, ondansetron (ZOFRAN) IV, sodium chloride flush, sodium chloride flush   Vital Signs    Vitals:   04/17/17 0807 04/17/17 0827 04/17/17 0842 04/17/17 0900  BP: 128/72 117/62 116/66 113/70  Pulse: 71 65 62 73  Resp: (!) 21 (!) 21 17 20   Temp:      TempSrc:      SpO2: 100% 100% 98% 100%  Weight:      Height:        Intake/Output Summary (Last 24 hours) at 04/17/17 0924 Last data filed at 04/17/17 0844  Gross per 24 hour  Intake           988.33 ml  Output             1650 ml  Net           -661.67 ml   Filed Weights   04/14/17 2123 04/15/17 0546 04/16/17 0402  Weight: 298 lb 1 oz (135.2 kg) (!) 302 lb 6.4 oz (137.2 kg) (!) 303 lb (137.4 kg)    Telemetry    NSR, HR 50-60s - Personally Reviewed   Physical Exam   GEN:  , obese female   HEENT: normal  Neck: no JVD, carotid bruits, or masses Cardiac: RR. no murmurs, rubs, or gallops,no edema. Intact distal pulses bilaterally.  Respiratory: clear to auscultation bilaterally, normal work of breathing GI: soft, nontender, nondistended, + BS MS: no deformity or atrophy .  Right radial cath site looks good. TR band is in place  Skin: warm and dry, no rash Neuro: Alert and Oriented x 3, Strength and sensation are intact Psych:   Full affect  Labs    Chemistry  Recent Labs Lab 04/15/17 0037 04/15/17 0654  NA 136 136  K 4.2 4.2  CL 104 105  CO2 24 21*  GLUCOSE 186* 118*  BUN 20 19  CREATININE 1.03* 0.86  CALCIUM  9.0 9.0  PROT 6.5  --   ALBUMIN 3.9  --   AST 35  --   ALT 42  --   ALKPHOS 96  --   BILITOT 0.8  --   GFRNONAA >60 >60  GFRAA >60 >60  ANIONGAP 8 10     Hematology  Recent Labs Lab 04/14/17 1919 04/15/17 0037 04/15/17 0654  WBC 8.2 7.7 9.6  RBC 3.83* 3.75* 3.96  HGB 10.9* 10.5* 11.2*  HCT 33.3* 32.9* 34.2*  MCV 86.9 87.7 86.4  MCH 28.5 28.0 28.3  MCHC 32.7 31.9 32.7  RDW 13.7 13.4 13.2  PLT 255 270 250    Cardiac Enzymes  Recent Labs Lab 04/15/17 1200 04/15/17 1809 04/16/17 0052 04/16/17 0603  TROPONINI <0.03 <0.03 <0.03 <0.03   No results for input(s): TROPIPOC in the last 168 hours.   BNPNo results for input(s): BNP, PROBNP in the last 168 hours.   DDimer No results for input(s): DDIMER in the last 168 hours.   Radiology    Nm Myocar Multi W/spect W/wall Motion / Ef  Result Date: 04/16/2017  There was no ST segment deviation noted during stress.  No T wave inversion was noted during stress.  Defect 1: There is a medium defect of moderate severity present in  the mid anteroseptal, apical anterior and apical lateral location.  This is an intermediate risk study.  Findings consistent with ischemia.  Nuclear stress EF: 55%.  Abnormal study. Sensitivity and specificity is reduced by body habitus.  Donato SchultzMark Skains, MD    Cardiac Studies   Echo and myoview pending this admission.  Patient Profile     56 year old female admitted late last night for chest pain has hx of PAF, CP neg myoview in 2009, GERD, DVT, and PE after childbirth, HTN, and recent polyp with postmenopausal bleeding. Underwent D&C procedure yesterday with no complications. Developed CP in PACU. Neg work up so far. Plan per Dr. Mayford Knifeurner is for Nuc Stress this AM, CT angio chest and 2D echo.  Assessment & Plan    1. Chest pain:   Normal cors by cath this am . CT angio chest has ruled out PE.  2. Persistent atrial fibrillation: Has not had any recent palpitations, in NSR on tele.  -- cont Flecainide. CHADS2VASC is 2 ( HTN, female)  Would not start OAC at this point - just had surgery Has not been on OAC.  Will leave that decision of the timing of initiating OAC up to Dr. Eden EmmsNishan in the clinic   3. Hypertension: Well controlled on home meds.  4. Endometrial polyp:  s/p D&C earlier 04/14/2017.  Further treatment regarding this per GYN.  Hbg stable at 10.9   Anticipate DC later today   Kristeen MissPhilip Nahser, MD  04/17/2017 9:24 AM    [facility] [address],  [address] [place]  [address] Pager [phone] Phone: [phone]; Fax: [phone]

## 2017-04-18 ENCOUNTER — Encounter (HOSPITAL_COMMUNITY): Payer: Self-pay | Admitting: Interventional Cardiology

## 2017-04-23 ENCOUNTER — Encounter: Payer: Self-pay | Admitting: Nurse Practitioner

## 2017-05-12 ENCOUNTER — Encounter: Payer: Self-pay | Admitting: Nurse Practitioner

## 2017-05-12 ENCOUNTER — Ambulatory Visit (INDEPENDENT_AMBULATORY_CARE_PROVIDER_SITE_OTHER): Payer: Managed Care, Other (non HMO) | Admitting: Nurse Practitioner

## 2017-05-12 VITALS — BP 128/80 | HR 77 | Ht 67.0 in | Wt 306.0 lb

## 2017-05-12 DIAGNOSIS — I480 Paroxysmal atrial fibrillation: Secondary | ICD-10-CM

## 2017-05-12 LAB — CBC
Hematocrit: 34 % (ref 34.0–46.6)
Hemoglobin: 11.4 g/dL (ref 11.1–15.9)
MCH: 28.4 pg (ref 26.6–33.0)
MCHC: 33.5 g/dL (ref 31.5–35.7)
MCV: 85 fL (ref 79–97)
Platelets: 299 10*3/uL (ref 150–379)
RBC: 4.02 x10E6/uL (ref 3.77–5.28)
RDW: 13.2 % (ref 12.3–15.4)
WBC: 6.6 10*3/uL (ref 3.4–10.8)

## 2017-05-12 LAB — BASIC METABOLIC PANEL
BUN/Creatinine Ratio: 21 (ref 9–23)
BUN: 16 mg/dL (ref 6–24)
CO2: 25 mmol/L (ref 20–29)
Calcium: 9.5 mg/dL (ref 8.7–10.2)
Chloride: 104 mmol/L (ref 96–106)
Creatinine, Ser: 0.76 mg/dL (ref 0.57–1.00)
GFR calc Af Amer: 103 mL/min/{1.73_m2} (ref >59)
GFR calc non Af Amer: 89 mL/min/{1.73_m2} (ref >59)
Glucose: 88 mg/dL (ref 65–99)
Potassium: 4.8 mmol/L (ref 3.5–5.2)
Sodium: 145 mmol/L — ABNORMAL HIGH (ref 134–144)

## 2017-05-12 MED ORDER — APIXABAN 5 MG PO TABS: 5.0000 mg | ORAL_TABLET | Freq: Two times a day (BID) | ORAL | 6 refills | Status: DC

## 2017-05-12 MED ORDER — APIXABAN 2.5 MG PO TABS: 5.0000 mg | ORAL_TABLET | Freq: Two times a day (BID) | ORAL | Status: DC

## 2017-05-12 NOTE — Progress Notes (Signed)
CARDIOLOGY OFFICE NOTE  Date:  05/12/2017    Regina Morales Date of Birth: [birth year] Medical Record #[id number]  PCP:  Clovis Riley, L.August Saucer, MD  Cardiologist:  Eden Emms  Chief Complaint  Patient presents with  . Follow-up    Post cath/hospital visit - seen for Dr. Eden Emms    History of Present Illness: Regina Morales is a 56 y.o. female who presents today for a post hospital visit. Seen for Dr. Eden Emms.   She has a history of chest pain with a history of paroxysmal atrial fibrillation, CP, neg myoview in 2009, GERD, DVT and PE after childbirth, hypertension, and recent polyp with postmenopausal bleeding. Probable OSA as well.   Seen here back in May - cleared for her GYN surgery. She was doing well clinically.   She had a D&C procedure done in mid June and developed CP in the PACU. Transferred to [facility]. She reported that she was feeling short of breath and a pain that worsened with breathing that started when she woke in the PACU. The pain was severe and she was nauseated. She had been having some exertional CP for the last few weeks that improved with SL nitro at home.  She had negative cardiac enzymes,  EKG NSR, AT angio ruled out PE, normal 2D echo. She had a Nuclear stress test which was intermediate and the decision was made for cardiac cath and had clean cors and normal LV function. Recommendation to diurese and lifestyle modifications. She has a history of persistent atrial fibrillation but remained in sinus rhythm this admission - she remains on Flecainide. CHADVASC is 2 (HTN, female). Plan to not start OAC at this point because she had had surgery. She had not been on [facility] prior and this was to be deferred to Dr. Eden Emms when seen back in clinic.    Comes in today. Here alone. Still with some chest pain. Has it about 3 times a week. Says she goes in and out of AF. Says she can feel it "when transitioning". In NSR today. She is using her CPAP. No more vaginal bleeding reported. She  is interested in NOAC therapy. Wearing her CPAP. Still struggling with her weight.   Past Medical History:  Diagnosis Date  . Anemia    hx years ago  . Buzzing in ear    "right; even after OR"  . Chest pain    a. 2009: neg MV  ([facility])  . Depression   . DVT (deep vein thrombosis) in pregnancy (HCC) 1980's   LLE  . Fibromyalgia    "dx'd many many years ago; I haven't had any problems w/it" (04/15/2017)  . GERD (gastroesophageal reflux disease)   . Hepatic steatosis   . High cholesterol   . History of blood transfusion    "low count after appendix surgery" unsure # of units transfused  . Hypertension   . Migraines    "maybe once/2 months" (04/15/2017)  . Obesity   . OSA on CPAP   . Persistent atrial fibrillation (HCC)    a. Dx 07/2013;  b. CHA2DS2VASc=1 (female);  c. 07/2013 Echo:  EF 50-55%, no rwma, mod LVH.  . Pulmonary embolism (HCC) ~ 1984   "after I had my last child"  . SVD (spontaneous vaginal delivery)    x 4    Past Surgical History:  Procedure Laterality Date  . APPENDECTOMY  1990's  . COLONOSCOPY    . DILATATION & CURETTAGE/HYSTEROSCOPY WITH MYOSURE N/A 04/14/2017   Procedure:  DILATATION & CURETTAGE/HYSTEROSCOPY WITH MYOSURE;  Surgeon: Myna Hidalgozan, Jennifer, DO;  Location: [facility];  Service: Gynecology;  Laterality: N/A;  Polypectomy  . DILATION AND CURETTAGE OF UTERUS  1983   mab  . INNER EAR SURGERY Right 2000's  . LEFT HEART CATH AND CORONARY ANGIOGRAPHY N/A 04/17/2017   Procedure: Left Heart Cath and Coronary Angiography;  Surgeon: Corky CraftsVaranasi, Jayadeep S, MD;  [facility];  Service: Cardiovascular;  Laterality: N/A;  . PLANTAR FASCIA RELEASE Right 07/2015  . TUBAL LIGATION  1985     Medications: Current Meds  Medication Sig  . amLODipine (NORVASC) 2.5 MG tablet Take 1 tablet (2.5 mg total) by mouth every evening.  Marland Kitchen. atorvastatin (LIPITOR) 20 MG tablet Take 1 tablet (20 mg total) by mouth daily at 6 PM.  . buPROPion (WELLBUTRIN XL) 300 MG 24 hr  tablet Take 300 mg by mouth at bedtime.   . butalbital-acetaminophen-caffeine (FIORICET, ESGIC) 50-325-40 MG tablet Take 1 tablet by mouth 2 (two) times daily as needed for migraine.  . cholecalciferol (VITAMIN D) 1000 units tablet Take 1,000 Units by mouth daily.  . ferrous sulfate 325 (65 FE) MG EC tablet Take 325 mg by mouth daily.  . flecainide (TAMBOCOR) 50 MG tablet Take 1 tablet (50 mg total) by mouth 2 (two) times daily.  Marland Kitchen. ibuprofen (ADVIL,MOTRIN) 200 MG tablet Take 800 mg by mouth every 8 (eight) hours as needed for moderate pain.  Marland Kitchen. imipramine (TOFRANIL) 25 MG tablet Take 25 mg by mouth at bedtime.   . metoprolol tartrate (LOPRESSOR) 25 MG tablet Take 1 tablet (25 mg total) by mouth 2 (two) times daily.  . Multiple Vitamins-Minerals (MULTIVITAMIN WITH MINERALS) tablet Take 1 tablet by mouth daily. 50 + Vitamin  . nitroGLYCERIN (NITROSTAT) 0.4 MG SL tablet Place 1 tablet (0.4 mg total) under the tongue every 5 (five) minutes as needed for chest pain (hold for SBP < 110).  . [DISCONTINUED] aspirin 81 MG EC tablet Take 1 tablet (81 mg total) by mouth daily.     Allergies: Allergies  Allergen Reactions  . Codeine Nausea And Vomiting    Social History: The patient  reports that she quit smoking about 26 years ago. Her smoking use included Cigarettes. She has a 8.00 pack-year smoking history. She has never used smokeless tobacco. She reports that she does not drink alcohol or use drugs.   Family History: The patient's family history includes Alcohol abuse in her father; Atrial fibrillation in her brother; Cardiomyopathy in her daughter; Congestive Heart Failure in her mother; Kidney failure in her mother; Lung cancer in her father; Sudden Cardiac Death in her daughter; Wolff Parkinson White syndrome in her grandchild.   Review of Systems: Please see the history of present illness.   Otherwise, the review of systems is positive for none.   All other systems are reviewed and negative.     Physical Exam: VS:  BP 128/80 (BP Location: Left Arm, Patient Position: Sitting, Cuff Size: Large)   Pulse 77   Ht 5\' 7"  (1.702 m)   Wt (!) 306 lb (138.8 kg)   BMI 47.93 kg/m  .  BMI Body mass index is 47.93 kg/m.  Wt Readings from Last 3 Encounters:  05/12/17 (!) 306 lb (138.8 kg)  04/16/17 (!) 303 lb (137.4 kg)  04/09/17 (!) 302 lb 6 oz (137.2 kg)    General: Pleasant. Morbidly obese. She is alert and in no acute distress.   HEENT: Normal.  Neck: Supple, no JVD,  carotid bruits, or masses noted.  Cardiac: Regular rate and rhythm. No murmurs, rubs, or gallops. No edema.  Respiratory:  Lungs are clear to auscultation bilaterally with normal work of breathing.  GI: Soft and nontender.  MS: No deformity or atrophy. Gait and ROM intact.  Skin: Warm and dry. Color is normal.  Neuro:  Strength and sensation are intact and no gross focal deficits noted.  Psych: Alert, appropriate and with normal affect.   LABORATORY DATA:  EKG:  EKG is ordered today. This shows NSR.  Lab Results  Component Value Date   WBC 9.6 04/15/2017   HGB 11.2 (L) 04/15/2017   HCT 34.2 (L) 04/15/2017   PLT 250 04/15/2017   GLUCOSE 118 (H) 04/15/2017   CHOL 186 04/23/2014   TRIG 122 04/23/2014   HDL 53 04/23/2014   LDLCALC 109 (H) 04/23/2014   ALT 42 04/15/2017   AST 35 04/15/2017   NA 136 04/15/2017   K 4.2 04/15/2017   CL 105 04/15/2017   CREATININE 0.86 04/15/2017   BUN 19 04/15/2017   CO2 21 (L) 04/15/2017   TSH 0.606 04/15/2017   INR 1.11 04/15/2017   HGBA1C 5.3 04/22/2014     BNP (last 3 results) No results for input(s): BNP in the last 8760 hours.  ProBNP (last 3 results) No results for input(s): PROBNP in the last 8760 hours.   Other Studies Reviewed Today:  Echo Study Conclusions 04/2017  - Left ventricle: The cavity size was normal. Systolic function was   normal. The estimated ejection fraction was in the range of 55%   to 60%. Wall motion was normal; there were no  regional wall   motion abnormalities. - Aortic valve: There was trivial regurgitation. - Left atrium: The atrium was mildly dilated.   Left Heart Cath and Coronary Angiography 04/2017  Conclusion     The left ventricular systolic function is normal.  LV end diastolic pressure is moderately elevated. LVEDP 25 mm Hg.  The left ventricular ejection fraction is 50-55% by visual estimate.  There is no aortic valve stenosis.  No angiographically apparent coronary artery disease.   Consider diuresis.  Continue preventive therapy.    CT CHEST ANGIOGRAM IMPRESSION: 1. No evidence of pulmonary embolus. 2. Bilateral dependent subsegmental atelectasis noted. Lungs otherwise clear.   Electronically Signed   By: Roanna Raider M.D.   On: 04/15/2017 01:50  Assessment/Plan:  1. Chest pain - post op GYN surgery for endometrial polyp - negative cardiac cath with no evidence of coronary disease - would defer back to PCP to look for other causes. Still needs CV risk factor modification - discussed at length.   2. PAF - she has been on chronic Flecainide - CHADSVASC of 2 (gender/HTN) - discussed with Dr. Eden Emms here in the office today - will start Eliquis 5 mg to take twice a day. See pharmacist in a month with repeat lab. Lab today as well.   3. Prior PE/DVT - most recent CT negative  4. Obesity - weight loss encouraged.   5. OSA:  F/u with primary for CPAP titration   6. HTN - controlled on her current regimen   Current medicines are reviewed with the patient today.  The patient does not have concerns regarding medicines other than what has been noted above.  The following changes have been made:  See above.  Labs/ tests ordered today include:    Orders Placed This Encounter  Procedures  . Basic metabolic panel  .  CBC  . EKG 12-Lead     Disposition:   FU with pharmacy in one month. See Dr. Eden Emms in 4 months.   Patient is agreeable to this plan and will call  if any problems develop in the interim.   SignedNorma Fredrickson, NP  05/12/2017 9:05 AM  [facility] [address] [place], [place]  [address] Phone: [phone] Fax: [phone]

## 2017-05-12 NOTE — Patient Instructions (Addendum)
We will be checking the following labs today - BMET and CBC today   Medication Instructions:    Continue with your current medicines. BUT  STOP aspirin after today's dose  START Eliquis 5 mg to take twice a day - we will give you samples - I have sent this to the drug store.    Testing/Procedures To Be Arranged:  N/A  Follow-Up:   See Pharmacist in one month to follow up on Eliquis  See Dr. Eden EmmsNishan in 4 months    Other Special Instructions:   N/A    If you need a refill on your cardiac medications before your next appointment, please call your pharmacy.   Call the [facility] office at [phone] if you have any questions, problems or concerns.

## 2017-05-26 ENCOUNTER — Telehealth: Payer: Self-pay

## 2017-05-26 NOTE — Telephone Encounter (Signed)
**Note De-Identified [name] Obfuscation** The pts Eliquis PA has been approved per message from covermy meds. Approval good from 04/12/17 until 05/22/18. Case ID: [id number].

## 2017-06-11 ENCOUNTER — Telehealth: Payer: Self-pay | Admitting: Cardiovascular Disease

## 2017-06-11 NOTE — Telephone Encounter (Signed)
New Message  Pt called requesting speak with Rn about her follow up date for her medication Eliquis. Please call back to discuss

## 2017-06-11 NOTE — Telephone Encounter (Signed)
Patient wants to reschedule her appointment.

## 2017-06-12 ENCOUNTER — Ambulatory Visit: Payer: Managed Care, Other (non HMO)

## 2017-08-14 ENCOUNTER — Other Ambulatory Visit: Payer: Self-pay | Admitting: *Deleted

## 2017-08-14 MED ORDER — APIXABAN 5 MG PO TABS: 5.0000 mg | ORAL_TABLET | Freq: Two times a day (BID) | ORAL | 1 refills | Status: DC

## 2017-09-08 NOTE — Progress Notes (Deleted)
CARDIOLOGY OFFICE NOTE  Date:  09/08/2017    Regina Morales Date of Birth: 1960-11-07 Medical Record #[id number]  PCP:  Clovis Riley, L.August Saucer, MD  Cardiologist:  Eden Emms  No chief complaint on file.   History of Present Illness: Regina Morales is a 56 y.o. female with a history of chest pain and  paroxysmal atrial fibrillation, CP, neg myoview in 2009, GERD, DVT and PE after childbirth, hypertension, and recent polyp with postmenopausal bleeding. Probable OSA as well.   She had a D&C procedure done in mid June 2018  and developed CP in the PACU. Transferred to [facility]. She reported that she was feeling short of breath and a pain that worsened with breathing that started when she woke in the PACU. She had negative cardiac enzymes,  EKG NSR, AT angio ruled out PE, normal 2D echo. She had a Nuclear stress test which was intermediate and the decision was made for cardiac cath and had clean cors and normal LV function. Recommendation to diurese and lifestyle modifications. She has a history of persistent atrial fibrillation but remained in sinus rhythm this admission - she remains on Flecainide. CHADVASC is 2 (HTN, female). Plan to not start OAC at this point because she had had surgery.    Comes in today. Here alone. Still with some chest pain. Has it about 3 times a week. Says she goes in and out of AF. Says she can feel it "when transitioning". In NSR today. She is using her CPAP. No more vaginal bleeding reported. She is interested in NOAC therapy. Wearing her CPAP. Still struggling with her weight.   Past Medical History:  Diagnosis Date  . Anemia    hx years ago  . Buzzing in ear    "right; even after OR"  . Chest pain    a. 2009: neg MV  ([facility])  . Depression   . DVT (deep vein thrombosis) in pregnancy (HCC) 1980's   LLE  . Fibromyalgia    "dx'd many many years ago; I haven't had any problems w/it" (04/15/2017)  . GERD (gastroesophageal reflux disease)   . Hepatic steatosis   .  High cholesterol   . History of blood transfusion    "low count after appendix surgery" unsure # of units transfused  . Hypertension   . Migraines    "maybe once/2 months" (04/15/2017)  . Obesity   . OSA on CPAP   . Persistent atrial fibrillation (HCC)    a. Dx 07/2013;  b. CHA2DS2VASc=1 (female);  c. 07/2013 Echo:  EF 50-55%, no rwma, mod LVH.  . Pulmonary embolism (HCC) ~ 1984   "after I had my last child"  . SVD (spontaneous vaginal delivery)    x 4    Past Surgical History:  Procedure Laterality Date  . APPENDECTOMY  1990's  . COLONOSCOPY    . DILATION AND CURETTAGE OF UTERUS  1983   mab  . INNER EAR SURGERY Right 2000's  . PLANTAR FASCIA RELEASE Right 07/2015  . TUBAL LIGATION  1985     Medications: No outpatient medications have been marked as taking for the 09/12/17 encounter (Appointment) with Wendall Stade, MD.     Allergies: Allergies  Allergen Reactions  . Codeine Nausea And Vomiting    Social History: The patient  reports that she quit smoking about 26 years ago. Her smoking use included cigarettes. She has a 8.00 pack-year smoking history. she has never used smokeless tobacco. She reports that she does  not drink alcohol or use drugs.   Family History: The patient's family history includes Alcohol abuse in her father; Atrial fibrillation in her brother; Cardiomyopathy in her daughter; Congestive Heart Failure in her mother; Kidney failure in her mother; Lung cancer in her father; Sudden Cardiac Death in her daughter; Wolff Parkinson White syndrome in her grandchild.   Review of Systems: Please see the history of present illness.   Otherwise, the review of systems is positive for none.   All other systems are reviewed and negative.   Physical Exam: VS:  There were no vitals taken for this visit. Marland Kitchen  BMI There is no height or weight on file to calculate BMI.  Wt Readings from Last 3 Encounters:  05/12/17 (!) 306 lb (138.8 kg)  04/16/17 (!) 303 lb (137.4 kg)   04/09/17 (!) 302 lb 6 oz (137.2 kg)    General: Pleasant. Morbidly obese. She is alert and in no acute distress.   HEENT: Normal.  Neck: Supple, no JVD, carotid bruits, or masses noted.  Cardiac: Regular rate and rhythm. No murmurs, rubs, or gallops. No edema.  Respiratory:  Lungs are clear to auscultation bilaterally with normal work of breathing.  GI: Soft and nontender.  MS: No deformity or atrophy. Gait and ROM intact.  Skin: Warm and dry. Color is normal.  Neuro:  Strength and sensation are intact and no gross focal deficits noted.  Psych: Alert, appropriate and with normal affect.   LABORATORY DATA:  EKG:  EKG is ordered today. This shows NSR.  Lab Results  Component Value Date   WBC 6.6 05/12/2017   HGB 11.4 05/12/2017   HCT 34.0 05/12/2017   PLT 299 05/12/2017   GLUCOSE 88 05/12/2017   CHOL 186 04/23/2014   TRIG 122 04/23/2014   HDL 53 04/23/2014   LDLCALC 109 (H) 04/23/2014   ALT 42 04/15/2017   AST 35 04/15/2017   NA 145 (H) 05/12/2017   K 4.8 05/12/2017   CL 104 05/12/2017   CREATININE 0.76 05/12/2017   BUN 16 05/12/2017   CO2 25 05/12/2017   TSH 0.606 04/15/2017   INR 1.11 04/15/2017   HGBA1C 5.3 04/22/2014     BNP (last 3 results) No results for input(s): BNP in the last 8760 hours.  ProBNP (last 3 results) No results for input(s): PROBNP in the last 8760 hours.   Other Studies Reviewed Today:  Echo Study Conclusions 04/2017  - Left ventricle: The cavity size was normal. Systolic function was   normal. The estimated ejection fraction was in the range of 55%   to 60%. Wall motion was normal; there were no regional wall   motion abnormalities. - Aortic valve: There was trivial regurgitation. - Left atrium: The atrium was mildly dilated.   Left Heart Cath and Coronary Angiography 04/2017  Conclusion     The left ventricular systolic function is normal.  LV end diastolic pressure is moderately elevated. LVEDP 25 mm Hg.  The left  ventricular ejection fraction is 50-55% by visual estimate.  There is no aortic valve stenosis.  No angiographically apparent coronary artery disease.   Consider diuresis.  Continue preventive therapy.    CT CHEST ANGIOGRAM IMPRESSION: 1. No evidence of pulmonary embolus. 2. Bilateral dependent subsegmental atelectasis noted. Lungs otherwise clear.   Electronically Signed   By: Roanna Raider M.D.   On: 04/15/2017 01:50  Assessment/Plan:  1. Chest pain - post op GYN surgery for endometrial polyp - negative cardiac cath with no  evidence of coronary disease - would defer back to PCP to look for other causes. Still needs CV risk factor modification - discussed at length.   2. PAF - she has been on chronic Flecainide - CHADSVASC of 2 (gender/HTN) - continue eliquis and flecainide  3. Prior PE/DVT - most recent CT negative  4. Obesity - weight loss encouraged.   5. OSA:  F/u with primary for CPAP titration   6. HTN - controlled on her current regimen  Charlton HawsPeter Freya Zobrist

## 2017-09-12 ENCOUNTER — Ambulatory Visit: Payer: Managed Care, Other (non HMO) | Admitting: Cardiovascular Disease

## 2017-09-24 ENCOUNTER — Telehealth: Payer: Self-pay | Admitting: Cardiovascular Disease

## 2017-09-24 NOTE — Telephone Encounter (Signed)
Patient reports having chest heaviness this morning around 11 AM while she was at work. She states that it started while she was sitting at her desk. She states that when she first stood up to go to the bathroom she felt SOB, chills, and a little lightheaded. She states that she has not had any Afib that she can tell when she is in it. She did not have any vitals to offer. She states that she is taking her meds as directed. Denies any NTG use. She states that it lasted for about 20 minutes and resolved on its own. She states that she knows that it lasted for about 20 minutes because that's how long she gets for her lunch and it was resolved by the end of her lunch. Patient denies any symptoms at this time or any recurrent episodes. Patient's cath 04/2017 clean with no evidence of CAD. Instructed the patient to continue to monitor her symptoms and let us know if they worsen. ER precautions reviewed. Patient verbalized understanding and thanked me for the call.

## 2017-09-24 NOTE — Telephone Encounter (Signed)
New message   Patient reporting chest heaviness today around 11am. States the episode lasted about 20 minutes, then it went away.  She is not currently sob. Please call.    Pt c/o Shortness Of Breath: STAT if SOB developed within the last 24 hours or pt is noticeably SOB on the phone  1. Are you currently SOB (can you hear that pt is SOB on the phone)? NO  2. How long have you been experiencing SOB? Today only  3. Are you SOB when sitting or when up moving around? Sitting to standing  4. Are you currently experiencing any other symptoms? Chills, lightheaded when episode happened

## 2017-09-29 NOTE — Telephone Encounter (Signed)
Left message for patient to call back  

## 2017-09-29 NOTE — Telephone Encounter (Signed)
This is not her heart call/fu with primary

## 2017-10-02 ENCOUNTER — Ambulatory Visit: Payer: Managed Care, Other (non HMO)

## 2017-10-02 ENCOUNTER — Ambulatory Visit (INDEPENDENT_AMBULATORY_CARE_PROVIDER_SITE_OTHER): Payer: Managed Care, Other (non HMO) | Admitting: *Deleted

## 2017-10-02 DIAGNOSIS — I480 Paroxysmal atrial fibrillation: Secondary | ICD-10-CM

## 2017-10-02 DIAGNOSIS — Z5181 Encounter for therapeutic drug level monitoring: Secondary | ICD-10-CM | POA: Diagnosis not present

## 2017-10-02 LAB — BASIC METABOLIC PANEL
BUN / CREAT RATIO: 15 (ref 9–23)
BUN: 12 mg/dL (ref 6–24)
CO2: 26 mmol/L (ref 20–29)
CREATININE: 0.8 mg/dL (ref 0.57–1.00)
Calcium: 9.6 mg/dL (ref 8.7–10.2)
Chloride: 100 mmol/L (ref 96–106)
GFR calc Af Amer: 97 mL/min/{1.73_m2} (ref >59)
GFR calc non Af Amer: 84 mL/min/{1.73_m2} (ref >59)
GLUCOSE: 96 mg/dL (ref 65–99)
Potassium: 4.3 mmol/L (ref 3.5–5.2)
Sodium: 141 mmol/L (ref 134–144)

## 2017-10-02 LAB — CBC
HEMOGLOBIN: 12 g/dL (ref 11.1–15.9)
Hematocrit: 35.8 % (ref 34.0–46.6)
MCH: 29.1 pg (ref 26.6–33.0)
MCHC: 33.5 g/dL (ref 31.5–35.7)
MCV: 87 fL (ref 79–97)
PLATELETS: 294 10*3/uL (ref 150–379)
RBC: 4.13 x10E6/uL (ref 3.77–5.28)
RDW: 13.7 % (ref 12.3–15.4)
WBC: 7.9 10*3/uL (ref 3.4–10.8)

## 2017-10-02 NOTE — Progress Notes (Signed)
Pt was started on Eliquis for PAF on first week of August. 2018   Reviewed patients medication list.  Pt is not currently on any combined P-gp and strong CYP3A4 inhibitors/inducers (ketoconazole, traconazole, ritonavir, carbamazepine, phenytoin, rifampin, St. John's wort).  Reviewed labs.  SCr 0.80 , Weight 139.09 kg.  Dose  Is  appropriate based on age, weight, and SCr.  Hgb 12.0 and HCT 35.8 A full discussion of the nature of anticoagulants has been carried out.  A benefit/risk analysis has been presented to the patient, so that they understand the justification for choosing anticoagulation with Eliquis at this time.  The need for compliance is stressed.  Pt is aware to take the medication twice daily.  Side effects of potential bleeding are discussed, including unusual colored urine or stools, coughing up blood or coffee ground emesis, nose bleeds or serious fall or head trauma.  Discussed signs and symptoms of stroke. The patient should avoid any OTC items containing aspirin or ibuprofen.  Avoid alcohol consumption.   Call if any signs of abnormal bleeding.  Discussed financial obligations and states is not having  any difficulty in obtaining medication.  Next lab test in 6 months. Pt states she has missed few dose of Eliquis  But is reminded to take Eliquis 5mg  every 12 hours  . States she has not had any bleeding  Denies any problems being on Eliquis Saw Dr Eden EmmsNishan in July and has return appt set up for January  Is on Diclofenac Sodium 75 mg bid prn for pain in arm Pt knows to monitor for any bleeding or extra bruising and she states she is only taking prn and it is not really helping her so she may stop taking it soon and see Orthopedic physician regarding elbow pain Pt also states she is to have dental extraction of 7 teeth in January and is instructed to get clearance from Dr Eden EmmsNishan regarding this and she states understanding . To obtain CBC and BMET today and will call with results Spoke with pt and  instructed she is on correct dose of Eliquis and to continue to take Eliquis 5mg  every 12 hours and made an appt for recheck in 6 months. Pt also instructed will need to call Dr Eden EmmsNishan to get clearance for dental extractions in May and she states understanding

## 2017-11-04 HISTORY — PX: BREAST CYST ASPIRATION: SHX578

## 2017-11-24 NOTE — Progress Notes (Signed)
CARDIOLOGY OFFICE NOTE  Date:  11/27/2017    Regina Morales Date of Birth: [birth year] Medical Record #[id number]  PCP:  Regina Morales, L.Regina Saucer, MD  Cardiologist:  Regina Morales  No chief complaint on file.   History of Present Illness: Regina Morales is a 57 y.o. female who presents today for f/U PAF and atypical chest pain.   She has a history of chest pain with a history of paroxysmal atrial fibrillation, CP, neg myoview in 2009, GERD, DVT and PE after childbirth, hypertension, and recent polyp with postmenopausal bleeding. Probable OSA as well.   D&C June with post op SSCP. CT negative PE Echo normal  Cath with no CAD and normal LV function 04/17/17 done by Dr Eldridge Dace  EDP noted to be elevated at 25 mmHg  She has a history of persistent atrial fibrillation but remained in sinus rhythm this admission - she remains on Flecainide. CHADVASC is 2 (HTN, female).   Says she goes in and out of AF. Says she can feel it "when transitioning". In NSR today. She is using her CPAP. No more vaginal bleeding reported. She is interested in NOAC therapy. Wearing her CPAP. Still struggling with her weight.   Started on eliquis 08/14/17 Renal function normal   Having lots of right arm issues Strained it while moving in September and still weak   Past Medical History:  Diagnosis Date  . Anemia    hx years ago  . Buzzing in ear    "right; even after OR"  . Chest pain    a. 2009: neg MV  ([facility])  . Depression   . DVT (deep vein thrombosis) in pregnancy (HCC) 1980's   LLE  . Fibromyalgia    "dx'd many many years ago; I haven't had any problems w/it" (04/15/2017)  . GERD (gastroesophageal reflux disease)   . Hepatic steatosis   . High cholesterol   . History of blood transfusion    "low count after appendix surgery" unsure # of units transfused  . Hypertension   . Migraines    "maybe once/2 months" (04/15/2017)  . Obesity   . OSA on CPAP   . Persistent atrial fibrillation (HCC)    a. Dx  07/2013;  b. CHA2DS2VASc=1 (female);  c. 07/2013 Echo:  EF 50-55%, no rwma, mod LVH.  . Pulmonary embolism (HCC) ~ 1984   "after I had my last child"  . SVD (spontaneous vaginal delivery)    x 4    Past Surgical History:  Procedure Laterality Date  . APPENDECTOMY  1990's  . COLONOSCOPY    . DILATATION & CURETTAGE/HYSTEROSCOPY WITH MYOSURE N/A 04/14/2017   Procedure: DILATATION & CURETTAGE/HYSTEROSCOPY WITH MYOSURE;  Surgeon: Regina Hidalgo, DO;  Location: [facility];  Service: Gynecology;  Laterality: N/A;  Polypectomy  . DILATION AND CURETTAGE OF UTERUS  1983   mab  . INNER EAR SURGERY Right 2000's  . LEFT HEART CATH AND CORONARY ANGIOGRAPHY N/A 04/17/2017   Procedure: Left Heart Cath and Coronary Angiography;  Surgeon: Regina Crafts, MD;  Location: [facility] INVASIVE CV LAB;  Service: Cardiovascular;  Laterality: N/A;  . PLANTAR FASCIA RELEASE Right 07/2015  . TUBAL LIGATION  1985     Medications: Current Meds  Medication Sig  . amLODipine (NORVASC) 2.5 MG tablet Take 1 tablet (2.5 mg total) by mouth every evening.  Regina Morales apixaban (ELIQUIS) 5 MG TABS tablet Take 1 tablet (5 mg total) by mouth 2 (two) times daily.  Regina Morales atorvastatin (LIPITOR) 20 MG  tablet Take 1 tablet (20 mg total) by mouth daily at 6 PM.  . buPROPion (WELLBUTRIN XL) 300 MG 24 hr tablet Take 300 mg by mouth at bedtime.   . butalbital-acetaminophen-caffeine (FIORICET, ESGIC) 50-325-40 MG tablet Take 1 tablet by mouth 2 (two) times daily as needed for migraine.  . cholecalciferol (VITAMIN D) 1000 units tablet Take 1,000 Units by mouth daily.  . cyclobenzaprine (FLEXERIL) 10 MG tablet Take 1 tablet by mouth 3 (three) times daily.  . diclofenac (VOLTAREN) 50 MG EC tablet Take 1 tablet by mouth 2 (two) times daily.  . ferrous sulfate 325 (65 FE) MG EC tablet Take 325 mg by mouth daily.  . flecainide (TAMBOCOR) 50 MG tablet Take 1 tablet (50 mg total) by mouth 2 (two) times daily.  Regina Morales imipramine (TOFRANIL) 25 MG tablet Take 25 mg by  mouth at bedtime.   . metoprolol tartrate (LOPRESSOR) 25 MG tablet Take 1 tablet (25 mg total) by mouth 2 (two) times daily.  . Multiple Vitamins-Minerals (MULTIVITAMIN WITH MINERALS) tablet Take 1 tablet by mouth daily. 50 + Vitamin  . nitroGLYCERIN (NITROSTAT) 0.4 MG SL tablet Place 1 tablet (0.4 mg total) under the tongue every 5 (five) minutes as needed for chest pain (hold for SBP < 110).  . ranitidine (ZANTAC) 300 MG tablet Take 300 mg by mouth daily.     Allergies: Allergies  Allergen Reactions  . Codeine Nausea And Vomiting    Social History: The patient  reports that she quit smoking about 27 years ago. Her smoking use included cigarettes. She has a 8.00 pack-year smoking history. she has never used smokeless tobacco. She reports that she does not drink alcohol or use drugs.   Family History: The patient's family history includes Alcohol abuse in her father; Atrial fibrillation in her brother; Cardiomyopathy in her daughter; Congestive Heart Failure in her mother; Kidney failure in her mother; Lung cancer in her father; Sudden Cardiac Death in her daughter; Wolff Parkinson White syndrome in her grandchild.   Review of Systems: Please see the history of present illness.   Otherwise, the review of systems is positive for none.   All other systems are reviewed and negative.   Physical Exam: VS:  BP 124/82   Pulse 76   Ht 5\' 7"  (1.702 m)   Wt (!) 313 lb 8 oz (142.2 kg)   SpO2 97%   BMI 49.10 kg/m  .  BMI Body mass index is 49.1 kg/m.  Wt Readings from Last 3 Encounters:  11/27/17 (!) 313 lb 8 oz (142.2 kg)  05/12/17 (!) 306 lb (138.8 kg)  04/16/17 (!) 303 lb (137.4 kg)   Affect appropriate Obese white female  HEENT: normal Neck supple with no adenopathy JVP normal no bruits no thyromegaly Lungs clear with no wheezing and good diaphragmatic motion Heart:  S1/S2 no murmur, no rub, gallop or click PMI normal Abdomen: benighn, BS positve, no tenderness, no AAA no  bruit.  No HSM or HJR Distal pulses intact with no bruits No edema Neuro non-focal Skin warm and dry No muscular weakness    LABORATORY DATA:  EKG: . 05/12/17 This shows NSR.  Lab Results  Component Value Date   WBC 7.9 10/02/2017   HGB 12.0 10/02/2017   HCT 35.8 10/02/2017   PLT 294 10/02/2017   GLUCOSE 96 10/02/2017   CHOL 186 04/23/2014   TRIG 122 04/23/2014   HDL 53 04/23/2014   LDLCALC 109 (H) 04/23/2014   ALT 42 04/15/2017  AST 35 04/15/2017   NA 141 10/02/2017   K 4.3 10/02/2017   CL 100 10/02/2017   CREATININE 0.80 10/02/2017   BUN 12 10/02/2017   CO2 26 10/02/2017   TSH 0.606 04/15/2017   INR 1.11 04/15/2017   HGBA1C 5.3 04/22/2014     BNP (last 3 results) No results for input(s): BNP in the last 8760 hours.  ProBNP (last 3 results) No results for input(s): PROBNP in the last 8760 hours.   Other Studies Reviewed Today:  Echo Study Conclusions 04/2017  - Left ventricle: The cavity size was normal. Systolic function was   normal. The estimated ejection fraction was in the range of 55%   to 60%. Wall motion was normal; there were no regional wall   motion abnormalities. - Aortic valve: There was trivial regurgitation. - Left atrium: The atrium was mildly dilated.   Left Heart Cath and Coronary Angiography 04/2017  Conclusion     The left ventricular systolic function is normal.  LV end diastolic pressure is moderately elevated. LVEDP 25 mm Hg.  The left ventricular ejection fraction is 50-55% by visual estimate.  There is no aortic valve stenosis.  No angiographically apparent coronary artery disease.   Consider diuresis.  Continue preventive therapy.    CT CHEST ANGIOGRAM IMPRESSION: 1. No evidence of pulmonary embolus. 2. Bilateral dependent subsegmental atelectasis noted. Lungs otherwise clear.   Electronically Signed   By: Roanna RaiderJeffery  Chang M.D.   On: 04/15/2017 01:50  Assessment/Plan:  1. Chest pain - non cardiac cath  04/17/17 no CAD   2. PAF - improved on eliquis and flecainide very infrequent palpitations  3. Prior PE/DVT - most recent CT negative 04/15/17 on eliquis for PAF   4. Obesity - weight loss encouraged. [facility]   5. OSA:  F/u with primary for CPAP titration   6. HTN - controlled on her current regimen  7. Ortho:  F/u Orlan LeavensOrtman has had MRI needs PT/OT    Charlton HawsPeter Nethaniel Mattie

## 2017-11-25 DIAGNOSIS — M778 Other enthesopathies, not elsewhere classified: Secondary | ICD-10-CM | POA: Insufficient documentation

## 2017-11-27 ENCOUNTER — Ambulatory Visit (INDEPENDENT_AMBULATORY_CARE_PROVIDER_SITE_OTHER): Payer: Managed Care, Other (non HMO) | Admitting: Cardiovascular Disease

## 2017-11-27 ENCOUNTER — Encounter: Payer: Self-pay | Admitting: Cardiovascular Disease

## 2017-11-27 VITALS — BP 124/82 | HR 76 | Ht 67.0 in | Wt 313.5 lb

## 2017-11-27 DIAGNOSIS — I480 Paroxysmal atrial fibrillation: Secondary | ICD-10-CM

## 2017-11-27 DIAGNOSIS — I10 Essential (primary) hypertension: Secondary | ICD-10-CM | POA: Diagnosis not present

## 2017-11-27 NOTE — Patient Instructions (Addendum)

## 2017-12-08 DIAGNOSIS — G5620 Lesion of ulnar nerve, unspecified upper limb: Secondary | ICD-10-CM | POA: Insufficient documentation

## 2017-12-17 ENCOUNTER — Telehealth: Payer: Self-pay

## 2017-12-17 NOTE — Telephone Encounter (Signed)
   [facility] Pre-operative Risk Assessment    Request for surgical clearance:  1. What type of surgery is being performed? Right Elbow;right elbow ulnar nerve release and or transposition  2. When is this surgery scheduled? 01/07/18  3. What type of clearance is required (medical clearance vs. Pharmacy clearance to hold med vs. Both)? Pharmacy clearance to hold  4. Are there any medications that need to be held prior to surgery and how long? Eliquis  5. Practice name and name of physician performing surgery? [facility] - Dr. Marga Hoots  6. What is your office phone and fax number? Phone #: [phone] Fax #: [phone] ATTN: Santiago Bur   7. Anesthesia type (None, local, MAC, general) ? General   Jacinta Shoe 12/17/2017, 12:08 PM  _________________________________________________________________   (provider comments below)

## 2017-12-17 NOTE — Telephone Encounter (Signed)
   Primary Cardiologist: No primary care provider on file.  Chart reviewed as part of pre-operative protocol coverage. Patient was contacted 12/17/2017 in reference to pre-operative risk assessment for pending surgery as outlined below.  Regina Morales was last seen on 11/27/2017 by Dr. Eden Morales.  Since that day, Regina Morales has done well. She had normal coronaries on cath in 04/2017.   Therefore, based on ACC/AHA guidelines, the patient would be at acceptable risk for the planned procedure without further cardiovascular testing.   I will route this recommendation to the requesting party via Epic fax function and remove from pre-op pool.  Please call with questions.  Will forward to our clinical pharmacist regarding the duration to hold eliquis.   [name], [place] 12/17/2017, 6:17 PM

## 2017-12-18 NOTE — Telephone Encounter (Signed)
PRE OP NOTE SENT VIA FAX .Zack Seal/CY

## 2017-12-18 NOTE — Telephone Encounter (Signed)
Patient with diagnosis of Afib with PE/DVT on Eliquis for anticoagulation.    Procedure: nerve release of elbow Date of procedure: 01/07/18  CHADS2-VASc score of  2 (CHF, HTN, AGE, DM2, stroke/tia x 2, CAD, AGE, female)  CrCl >17320ml/min  Per office protocol, patient can hold Eliquis for 24 hours prior to procedure.

## 2017-12-24 ENCOUNTER — Telehealth: Payer: Self-pay | Admitting: Cardiovascular Disease

## 2017-12-24 NOTE — Telephone Encounter (Signed)
New Message   Patient c/o Palpitations:  High priority if patient c/o lightheadedness, shortness of breath, or chest pain  1) How long have you had palpitations/irregular HR/ Afib? Are you having the symptoms now? Since last night at 5:30 and ended around 1 am  2) Are you currently experiencing lightheadedness, SOB or CP? Not at the moment but was having SOB  3) Do you have a history of afib (atrial fibrillation) or irregular heart rhythm? yes  4) Have you checked your BP or HR? (document readings if available): no  5) Are you experiencing any other symptoms? Dizziness

## 2017-12-24 NOTE — Telephone Encounter (Signed)
Patient complaining of having an A. FIB episode yesterday from 5:30 pm to 1:00 am. Patient stated she was dizzy and SOB at the time. Patient stated she is feeling better now, but she is concerned if and when it happens again. Made an appointment with the A. FIB clinic. Informed patient where to go for the A. FIB clinic and the code for the garage. Patient verbalized understanding and will be there tomorrow for her appointment.

## 2017-12-25 ENCOUNTER — Ambulatory Visit (HOSPITAL_COMMUNITY)
Admission: RE | Admit: 2017-12-25 | Discharge: 2017-12-25 | Disposition: A | Discharge status: Home / Self Care | Payer: Managed Care, Other (non HMO) | Source: Ambulatory Visit | Attending: Nurse Practitioner | Admitting: Nurse Practitioner

## 2017-12-25 ENCOUNTER — Encounter (HOSPITAL_COMMUNITY): Payer: Self-pay | Admitting: Nurse Practitioner

## 2017-12-25 VITALS — BP 132/84 | HR 86 | Ht 67.0 in | Wt 311.0 lb

## 2017-12-25 DIAGNOSIS — Z79899 Other long term (current) drug therapy: Secondary | ICD-10-CM | POA: Diagnosis not present

## 2017-12-25 DIAGNOSIS — I10 Essential (primary) hypertension: Secondary | ICD-10-CM | POA: Insufficient documentation

## 2017-12-25 DIAGNOSIS — E7800 Pure hypercholesterolemia, unspecified: Secondary | ICD-10-CM | POA: Diagnosis not present

## 2017-12-25 DIAGNOSIS — G4733 Obstructive sleep apnea (adult) (pediatric): Secondary | ICD-10-CM | POA: Diagnosis not present

## 2017-12-25 DIAGNOSIS — I480 Paroxysmal atrial fibrillation: Secondary | ICD-10-CM

## 2017-12-25 DIAGNOSIS — I4510 Unspecified right bundle-branch block: Secondary | ICD-10-CM | POA: Insufficient documentation

## 2017-12-25 DIAGNOSIS — Z7901 Long term (current) use of anticoagulants: Secondary | ICD-10-CM | POA: Insufficient documentation

## 2017-12-25 DIAGNOSIS — Z86718 Personal history of other venous thrombosis and embolism: Secondary | ICD-10-CM | POA: Insufficient documentation

## 2017-12-25 DIAGNOSIS — F329 Major depressive disorder, single episode, unspecified: Secondary | ICD-10-CM | POA: Insufficient documentation

## 2017-12-25 DIAGNOSIS — Z86711 Personal history of pulmonary embolism: Secondary | ICD-10-CM | POA: Diagnosis not present

## 2017-12-25 DIAGNOSIS — M797 Fibromyalgia: Secondary | ICD-10-CM | POA: Insufficient documentation

## 2017-12-25 DIAGNOSIS — Z87891 Personal history of nicotine dependence: Secondary | ICD-10-CM | POA: Diagnosis not present

## 2017-12-25 DIAGNOSIS — I440 Atrioventricular block, first degree: Secondary | ICD-10-CM | POA: Insufficient documentation

## 2017-12-25 DIAGNOSIS — E669 Obesity, unspecified: Secondary | ICD-10-CM | POA: Diagnosis not present

## 2017-12-25 DIAGNOSIS — K219 Gastro-esophageal reflux disease without esophagitis: Secondary | ICD-10-CM | POA: Insufficient documentation

## 2017-12-25 MED ORDER — DILTIAZEM HCL 30 MG PO TABS: ORAL_TABLET | ORAL | 1 refills | Status: DC

## 2017-12-25 NOTE — Patient Instructions (Signed)
Wear your CPAP nightly  Your physician has recommended you make the following change in your medication:  1)Cardizem 30mg  -- take 1 tablet every 4 hours AS NEEDED for AFIB heart rate >100 as long as top number of blood pressure >100.   If this does not convert you to normal rhythm either take your flecainide early or take an extra 50mg  of flecainide.

## 2017-12-25 NOTE — Progress Notes (Addendum)
Primary Care Physician: Clovis RileyMitchell, L.August Saucerean, MD Referring Physician: Dr. Nishan   Regina Morales is a 57 y.o. female with a h/o paroxysmal atrial fibrillation that is on flecainide  for an urgent visit in the afib clinic for an episode of afib 2 days prior in the evening. Pt states that she had worked more hours as usual and work had been very stressful that day. She has very few breakthrough episodes of afib on flecainide, which usually do not bother her but  this episode was concerning to the pt as she felt that it was faster than usual,and she felt very dizzy. This episode  lasted for hours which is longer than usual. She is back in SR today. She will need surgery on her rt elbow for nerve impingement the first week of March.  She does not use her cpap. No alcohol or caffeine, no exercise program, obese.   Today, she denies symptoms of palpitations, chest pain, shortness of breath, orthopnea, PND, lower extremity edema, dizziness, presyncope, syncope, or neurologic sequela. The patient is tolerating medications without difficulties and is otherwise without complaint today.   Past Medical History:  Diagnosis Date  . Anemia    hx years ago  . Buzzing in ear    "right; even after OR"  . Chest pain    a. 2009: neg MV  ([facility])  . Depression   . DVT (deep vein thrombosis) in pregnancy (HCC) 1980's   LLE  . Fibromyalgia    "dx'd many many years ago; I haven't had any problems w/it" (04/15/2017)  . GERD (gastroesophageal reflux disease)   . Hepatic steatosis   . High cholesterol   . History of blood transfusion    "low count after appendix surgery" unsure # of units transfused  . Hypertension   . Migraines    "maybe once/2 months" (04/15/2017)  . Obesity   . OSA on CPAP   . Persistent atrial fibrillation (HCC)    a. Dx 07/2013;  b. CHA2DS2VASc=1 (female);  c. 07/2013 Echo:  EF 50-55%, no rwma, mod LVH.  . Pulmonary embolism (HCC) ~ 1984   "after I had my last child"  . SVD (spontaneous  vaginal delivery)    x 4   Past Surgical History:  Procedure Laterality Date  . APPENDECTOMY  1990's  . COLONOSCOPY    . DILATATION & CURETTAGE/HYSTEROSCOPY WITH MYOSURE N/A 04/14/2017   Procedure: DILATATION & CURETTAGE/HYSTEROSCOPY WITH MYOSURE;  Surgeon: Myna Hidalgozan, Jennifer, DO;  Location: [facility];  Service: Gynecology;  Laterality: N/A;  Polypectomy  . DILATION AND CURETTAGE OF UTERUS  1983   mab  . INNER EAR SURGERY Right 2000's  . LEFT HEART CATH AND CORONARY ANGIOGRAPHY N/A 04/17/2017   Procedure: Left Heart Cath and Coronary Angiography;  Surgeon: Corky CraftsVaranasi, Jayadeep S, MD;  [facility] LAB;  Service: Cardiovascular;  Laterality: N/A;  . PLANTAR FASCIA RELEASE Right 07/2015  . TUBAL LIGATION  1985    Current Outpatient Medications  Medication Sig Dispense Refill  . amLODipine (NORVASC) 2.5 MG tablet Take 1 tablet (2.5 mg total) by mouth every evening. 30 tablet 9  . apixaban (ELIQUIS) 5 MG TABS tablet Take 1 tablet (5 mg total) by mouth 2 (two) times daily. 180 tablet 1  . atorvastatin (LIPITOR) 20 MG tablet Take 1 tablet (20 mg total) by mouth daily at 6 PM. 30 tablet 9  . buPROPion (WELLBUTRIN XL) 300 MG 24 hr tablet Take 300 mg by mouth at bedtime.     .Marland Kitchen  cholecalciferol (VITAMIN D) 1000 units tablet Take 1,000 Units by mouth daily.    . ferrous sulfate 325 (65 FE) MG EC tablet Take 325 mg by mouth daily.    . flecainide (TAMBOCOR) 50 MG tablet Take 1 tablet (50 mg total) by mouth 2 (two) times daily. 60 tablet 6  . imipramine (TOFRANIL) 25 MG tablet Take 25 mg by mouth at bedtime.     . metoprolol tartrate (LOPRESSOR) 25 MG tablet Take 1 tablet (25 mg total) by mouth 2 (two) times daily. 60 tablet 9  . Multiple Vitamins-Minerals (MULTIVITAMIN WITH MINERALS) tablet Take 1 tablet by mouth daily. 50 + Vitamin    . ranitidine (ZANTAC) 300 MG tablet Take 300 mg by mouth at bedtime.    . cyclobenzaprine (FLEXERIL) 10 MG tablet Take 1 tablet by mouth 3 (three) times daily.      . diclofenac (VOLTAREN) 50 MG EC tablet Take 1 tablet by mouth 2 (two) times daily.    Marland Kitchen diltiazem (CARDIZEM) 30 MG tablet Take 1 tablet every 4 hours AS NEEDED for AFIB heart rate >100 45 tablet 1   No current facility-administered medications for this encounter.     Allergies  Allergen Reactions  . Codeine Nausea And Vomiting    Social History   Socioeconomic History  . Marital status: Legally Separated    Spouse name: Not on file  . Number of children: 1  . Years of education: Not on file  . Highest education level: Not on file  Social Needs  . Financial resource strain: Not on file  . Food insecurity - worry: Not on file  . Food insecurity - inability: Not on file  . Transportation needs - medical: Not on file  . Transportation needs - non-medical: Not on file  Occupational History  . Not on file  Tobacco Use  . Smoking status: Former Smoker    Packs/day: 1.00    Years: 8.00    Pack years: 8.00    Types: Cigarettes    Last attempt to quit: 11/04/1990    Years since quitting: 27.1  . Smokeless tobacco: Never Used  Substance and Sexual Activity  . Alcohol use: No  . Drug use: No  . Sexual activity: Not Currently    Birth control/protection: Post-menopausal  Other Topics Concern  . Not on file  Social History Narrative   Lives in [place] with sister.  Does not routinely exercise.  Works in Teacher, early years/pre for [employer].    Family History  Problem Relation Age of Onset  . Lung cancer Father   . Alcohol abuse Father   . Kidney failure Mother        s/p renal Tx  . Congestive Heart Failure Mother   . Atrial fibrillation Brother        alive @ 36  . Sudden Cardiac Death Daughter        cardiac arrest  . Cardiomyopathy Daughter   . Evelene Croon Parkinson White syndrome Grandchild         granddaughter    ROS- All systems are reviewed and negative except as per the HPI above  Physical Exam: Vitals:   12/25/17 0927  BP: 132/84  Pulse: 86  Weight: (!) 311 lb (141.1 kg)   Height: 5\' 7"  (1.702 m)   Wt Readings from Last 3 Encounters:  12/25/17 (!) 311 lb (141.1 kg)  11/27/17 (!) 313 lb 8 oz (142.2 kg)  05/12/17 (!) 306 lb (138.8 kg)    Labs: Lab  Results  Component Value Date   NA 141 10/02/2017   K 4.3 10/02/2017   CL 100 10/02/2017   CO2 26 10/02/2017   GLUCOSE 96 10/02/2017   BUN 12 10/02/2017   CREATININE 0.80 10/02/2017   CALCIUM 9.6 10/02/2017   MG 2.0 04/15/2017   Lab Results  Component Value Date   INR 1.11 04/15/2017   Lab Results  Component Value Date   CHOL 186 04/23/2014   HDL 53 04/23/2014   LDLCALC 109 (H) 04/23/2014   TRIG 122 04/23/2014     GEN- The patient is well appearing, alert and oriented x 3 today.   Head- normocephalic, atraumatic Eyes-  Sclera clear, conjunctiva pink Ears- hearing intact Oropharynx- clear Neck- supple, no JVP Lymph- no cervical lymphadenopathy Lungs- Clear to ausculation bilaterally, normal work of breathing Heart- Regular rate and rhythm, no murmurs, rubs or gallops, PMI not laterally displaced GI- soft, NT, ND, + BS Extremities- no clubbing, cyanosis, or edema MS- no significant deformity or atrophy Skin- no rash or lesion Psych- euthymic mood, full affect Neuro- strength and sensation are intact  EKG-NSR at 86 bpm, IRBBB, pr int 202 ms, qrs int 96 ms, qtc 457 ms Epic records reviewed Echo-04/17/17-Study Conclusions  - Left ventricle: The cavity size was normal. Systolic function was   normal. The estimated ejection fraction was in the range of 55%   to 60%. Wall motion was normal; there were no regional wall   motion abnormalities. - Aortic valve: There was trivial regurgitation. - Left atrium: The atrium was mildly dilated.     Assessment and Plan: 1. Paroxysmal afib Usually well controlled on flecainide Discussed how to mange episodes of Afib I will Rx 30 mg Cardizem to use at onset of symptomatic afib, if HR over 100 and sys BP over 100 If that does not settle things  down, if time for flecainide go ahead and take, but may take one extra 50 mg  flecainide if necessary Strongly encouraged to use CPAP IF afib burden increases, consideration for increase to 100 mg bid but will need f/u EKG 3-5 days after increasing to see if IRBBB worsens Currently not the best ablation candidate due to obesity Weight loss/exercise encouraged   F/u with Dr. Eden Emms as scheduled  afib clinic as needed  Lupita Leash C. Matthew Fol[facility] [address] [place], [place] [address] [phone]

## 2018-01-26 ENCOUNTER — Other Ambulatory Visit: Payer: Self-pay | Admitting: Cardiovascular Disease

## 2018-03-05 ENCOUNTER — Other Ambulatory Visit: Payer: Self-pay | Admitting: *Deleted

## 2018-03-05 MED ORDER — APIXABAN 5 MG PO TABS: 5.0000 mg | ORAL_TABLET | Freq: Two times a day (BID) | ORAL | 1 refills | Status: DC

## 2018-04-28 ENCOUNTER — Telehealth: Payer: Self-pay

## 2018-04-28 NOTE — Telephone Encounter (Signed)
I received an Eliquis PA request from [facility] for the pts Eliquis.  I attempted to do the PA through covermymeds but was unable as I received a message stating "Patient Inactive" I called the pt pharmacy, [facility], and was given the info off of the pts insurance card as the card we have was scanned in on 04/17/2016 and may not be accurate.  I attempted the PA through covermymeds again and received the same message of "Patient Inactive".  I then called the pt who advised me that she has been out of work on disability and has just returned to work and that maybe her insurance has not been re-activated yet. She also states that she already picked up a 90 day supply of Eliquis using a card from the manufacturer and that as far as she knows her insurance will cover once re-activated and by the time she needs a refill.  I have advised her to contact me if a PA is required in the further. She verbalized understanding and thanked me for calling her.

## 2018-06-01 ENCOUNTER — Other Ambulatory Visit (HOSPITAL_COMMUNITY): Payer: Self-pay | Admitting: *Deleted

## 2018-06-01 MED ORDER — METOPROLOL TARTRATE 25 MG PO TABS: 25.0000 mg | ORAL_TABLET | Freq: Two times a day (BID) | ORAL | 6 refills | Status: DC

## 2018-06-01 MED ORDER — FLECAINIDE ACETATE 50 MG PO TABS: 50.0000 mg | ORAL_TABLET | Freq: Two times a day (BID) | ORAL | 6 refills | Status: DC

## 2018-06-22 ENCOUNTER — Telehealth: Payer: Self-pay

## 2018-06-22 NOTE — Telephone Encounter (Signed)
**Note De-Identified  Obfuscation** I have done an Eliquis PA through covermymeds. Key: [id number]

## 2018-08-07 ENCOUNTER — Ambulatory Visit (INDEPENDENT_AMBULATORY_CARE_PROVIDER_SITE_OTHER): Payer: Managed Care, Other (non HMO) | Admitting: Physician Assistant

## 2018-08-07 ENCOUNTER — Encounter: Payer: Self-pay | Admitting: Physician Assistant

## 2018-08-07 VITALS — BP 120/78 | HR 79 | Ht 67.0 in | Wt 302.1 lb

## 2018-08-07 DIAGNOSIS — Z5181 Encounter for therapeutic drug level monitoring: Secondary | ICD-10-CM | POA: Diagnosis not present

## 2018-08-07 DIAGNOSIS — G4733 Obstructive sleep apnea (adult) (pediatric): Secondary | ICD-10-CM | POA: Diagnosis not present

## 2018-08-07 DIAGNOSIS — Z9989 Dependence on other enabling machines and devices: Secondary | ICD-10-CM

## 2018-08-07 DIAGNOSIS — I480 Paroxysmal atrial fibrillation: Secondary | ICD-10-CM

## 2018-08-07 DIAGNOSIS — I10 Essential (primary) hypertension: Secondary | ICD-10-CM | POA: Diagnosis not present

## 2018-08-07 LAB — BASIC METABOLIC PANEL
BUN/Creatinine Ratio: 15 (ref 9–23)
BUN: 14 mg/dL (ref 6–24)
CALCIUM: 9.7 mg/dL (ref 8.7–10.2)
CHLORIDE: 100 mmol/L (ref 96–106)
CO2: 22 mmol/L (ref 20–29)
Creatinine, Ser: 0.93 mg/dL (ref 0.57–1.00)
GFR calc Af Amer: 80 mL/min/{1.73_m2} (ref >59)
GFR calc non Af Amer: 69 mL/min/{1.73_m2} (ref >59)
GLUCOSE: 92 mg/dL (ref 65–99)
Potassium: 4.2 mmol/L (ref 3.5–5.2)
Sodium: 139 mmol/L (ref 134–144)

## 2018-08-07 LAB — CBC
HEMATOCRIT: 36.6 % (ref 34.0–46.6)
HEMOGLOBIN: 12.6 g/dL (ref 11.1–15.9)
MCH: 29.6 pg (ref 26.6–33.0)
MCHC: 34.4 g/dL (ref 31.5–35.7)
MCV: 86 fL (ref 79–97)
Platelets: 335 10*3/uL (ref 150–450)
RBC: 4.25 x10E6/uL (ref 3.77–5.28)
RDW: 13.5 % (ref 12.3–15.4)
WBC: 7.9 10*3/uL (ref 3.4–10.8)

## 2018-08-07 LAB — TSH: TSH: 1 u[IU]/mL (ref 0.450–4.500)

## 2018-08-07 MED ORDER — METOPROLOL TARTRATE 50 MG PO TABS: 50.0000 mg | ORAL_TABLET | Freq: Two times a day (BID) | ORAL | 3 refills | Status: DC

## 2018-08-07 NOTE — Progress Notes (Signed)
Cardiology Office Note    Date:  08/07/2018   ID:  Regina Morales, DOB [birth year], MRN [id number]  PCP:  Asencion Gowda.August Saucer, MD  Cardiologist:   Dr. Eden Emms   Chief Complaint: atrial fibrillation   History of Present Illness:   Regina Morales is a 57 y.o. female with history of paroxysmal atrial fibrillation, normal cardiac cath in 2018 for chest pain, hypertension, hyperlipidemia, DVT and PE after childbirthand sleep apnea on CPAP presents for follow-up.  Admitted June 2018 with post op SSCP. CT negative PE Echo normal. Cath with no CAD and normal LV function 04/17/17 done by Dr Eldridge Dace. EDP noted to be elevated at 25 mmHg.  Here today for follow-up.  Patient complains of intermittent atrial fibrillation.  She was doing well up until few months ago when started to have having frequent episodes of atrial fibrillation. She can tell when he goes in A. fib.  She feels shortness of breath, chest pain, palpitation, lump in throat and dizziness.  She stays in atrial fibrillation for hours to days.  She takes as needed Cardizem with improvement in heart rate.  Otherwise, she takes metoprolol 25 mg twice daily and flecainide 50 mg twice daily.  Compliant with Eliquis.  No bleeding issue.  Denies orthopnea, PND, syncope, melena or blood in her stool or urine.  Avoids caffeine.  Past Medical History:  Diagnosis Date  . Anemia    hx years ago  . Buzzing in ear    "right; even after OR"  . Chest pain    a. 2009: neg MV  ([facility])  . Depression   . DVT (deep vein thrombosis) in pregnancy 1980's   LLE  . Fibromyalgia    "dx'd many many years ago; I haven't had any problems w/it" (04/15/2017)  . GERD (gastroesophageal reflux disease)   . Hepatic steatosis   . High cholesterol   . History of blood transfusion    "low count after appendix surgery" unsure # of units transfused  . Hypertension   . Migraines    "maybe once/2 months" (04/15/2017)  . Obesity   . OSA on CPAP   . Persistent atrial  fibrillation    a. Dx 07/2013;  b. CHA2DS2VASc=1 (female);  c. 07/2013 Echo:  EF 50-55%, no rwma, mod LVH.  . Pulmonary embolism (HCC) ~ 1984   "after I had my last child"  . SVD (spontaneous vaginal delivery)    x 4    Past Surgical History:  Procedure Laterality Date  . APPENDECTOMY  1990's  . COLONOSCOPY    . DILATATION & CURETTAGE/HYSTEROSCOPY WITH MYOSURE N/A 04/14/2017   Procedure: DILATATION & CURETTAGE/HYSTEROSCOPY WITH MYOSURE;  Surgeon: Myna Hidalgo, DO;  Location: [facility];  Service: Gynecology;  Laterality: N/A;  Polypectomy  . DILATION AND CURETTAGE OF UTERUS  1983   mab  . INNER EAR SURGERY Right 2000's  . LEFT HEART CATH AND CORONARY ANGIOGRAPHY N/A 04/17/2017   Procedure: Left Heart Cath and Coronary Angiography;  Surgeon: Corky Crafts, MD;  Location: [facility] INVASIVE CV LAB;  Service: Cardiovascular;  Laterality: N/A;  . PLANTAR FASCIA RELEASE Right 07/2015  . TUBAL LIGATION  1985    Current Medications: Prior to Admission medications   Medication Sig Start Date End Date Taking? Authorizing Provider  amLODipine (NORVASC) 2.5 MG tablet Take 1 tablet (2.5 mg total) by mouth every evening. 04/18/17  Yes Neva Seat, Tiffany, PA-C  apixaban (ELIQUIS) 5 MG TABS tablet Take 1 tablet (5 mg total) by mouth  2 (two) times daily. 03/05/18  Yes Wendall Stade, MD  atorvastatin (LIPITOR) 20 MG tablet Take 1 tablet (20 mg total) by mouth daily at 6 PM. 04/18/17  Yes Neva Seat, Tiffany, PA-C  buPROPion (WELLBUTRIN XL) 300 MG 24 hr tablet Take 300 mg by mouth at bedtime.    Yes [provider]  diltiazem (CARDIZEM) 30 MG tablet Take 1 tablet every 4 hours AS NEEDED for AFIB heart rate >100 12/25/17  Yes Newman Nip, NP  flecainide (TAMBOCOR) 50 MG tablet Take 1 tablet (50 mg total) by mouth 2 (two) times daily. 06/01/18  Yes Newman Nip, NP  imipramine (TOFRANIL) 25 MG tablet Take 25 mg by mouth at bedtime.  01/14/16  Yes [provider]  metoprolol tartrate (LOPRESSOR)  25 MG tablet Take 1 tablet (25 mg total) by mouth 2 (two) times daily. 06/01/18  Yes Newman Nip, NP  Multiple Vitamins-Minerals (MULTIVITAMIN WITH MINERALS) tablet Take 1 tablet by mouth daily. 50 + Vitamin   Yes [provider]  ranitidine (ZANTAC) 300 MG tablet Take 300 mg by mouth at bedtime.   Yes [provider]    Allergies:   Codeine   Social History   Socioeconomic History  . Marital status: Legally Separated    Spouse name: Not on file  . Number of children: 1  . Years of education: Not on file  . Highest education level: Not on file  Occupational History  . Not on file  Social Needs  . Financial resource strain: Not on file  . Food insecurity:    Worry: Not on file    Inability: Not on file  . Transportation needs:    Medical: Not on file    Non-medical: Not on file  Tobacco Use  . Smoking status: Former Smoker    Packs/day: 1.00    Years: 8.00    Pack years: 8.00    Types: Cigarettes    Last attempt to quit: 11/04/1990    Years since quitting: 27.7  . Smokeless tobacco: Never Used  Substance and Sexual Activity  . Alcohol use: No  . Drug use: No  . Sexual activity: Not Currently    Birth control/protection: Post-menopausal  Lifestyle  . Physical activity:    Days per week: Not on file    Minutes per session: Not on file  . Stress: Not on file  Relationships  . Social connections:    Talks on phone: Not on file    Gets together: Not on file    Attends religious service: Not on file    Active member of club or organization: Not on file    Attends meetings of clubs or organizations: Not on file    Relationship status: Not on file  Other Topics Concern  . Not on file  Social History Narrative   Lives in [place] with sister.  Does not routinely exercise.  Works in Teacher, early years/pre for [employer].     Family History:  The patient's family history includes Alcohol abuse in her father; Atrial fibrillation in her brother; Cardiomyopathy in her  daughter; Congestive Heart Failure in her mother; Kidney failure in her mother; Lung cancer in her father; Sudden Cardiac Death in her daughter; Wolff Parkinson White syndrome in her grandchild.   ROS:   Please see the history of present illness.    ROS All other systems reviewed and are negative.   PHYSICAL EXAM:   VS:  BP 120/78   Pulse 79  Ht 5\' 7"  (1.702 m)   Wt (!) 137 kg   SpO2 98%   BMI 47.32 kg/m    GEN: Well nourished, well developed, in no acute distress  HEENT: normal  Neck: no JVD, carotid bruits, or masses Cardiac: RRR; no murmurs, rubs, or gallops,no edema  Respiratory:  clear to auscultation bilaterally, normal work of breathing GI: soft, nontender, nondistended, + BS MS: no deformity or atrophy  Skin: warm and dry, no rash Neuro:  Alert and Oriented x 3, Strength and sensation are intact Psych: euthymic mood, full affect  Wt Readings from Last 3 Encounters:  08/07/18 (!) 137 kg  12/25/17 (!) 141.1 kg  11/27/17 (!) 142.2 kg      Studies/Labs Reviewed:   EKG:  EKG is ordered today.  The ekg ordered today demonstrates sinus rhythm at rate of 79 bpm  Recent Labs: 10/02/2017: BUN 12; Creatinine, Ser 0.80; Hemoglobin 12.0; Platelets 294; Potassium 4.3; Sodium 141   Lipid Panel    Component Value Date/Time   CHOL 186 04/23/2014 0018   TRIG 122 04/23/2014 0018   HDL 53 04/23/2014 0018   CHOLHDL 3.5 04/23/2014 0018   VLDL 24 04/23/2014 0018   LDLCALC 109 (H) 04/23/2014 0018    Additional studies/ records that were reviewed today include:   As summarized above    ASSESSMENT & PLAN:    1. Paroxysmal atrial fibrillation -Maintaining sinus rhythm today however recurrent breakthrough episodes for past few months lasting for hours to days.  She is symptomatic when goes in atrial fibrillation.  Cannot function for days, affecting her lifestyle.  Check electrolytes, hemoglobin and thyroid function test.  Stop amlodipine and increase Lopressor to 50 mg  twice daily.  Continue flecainide for now (seems failed)>> will have her follow-up in A. fib clinic and with Dr. Elberta Fortis for ablation/another antiarrhythmic drug.  2.  Obstructive sleep apnea -Compliant with CPAP   3.  Hypertension -Stable on current medication  Medication Adjustments/Labs and Tests Ordered: Current medicines are reviewed at length with the patient today.  Concerns regarding medicines are outlined above.  Medication changes, Labs and Tests ordered today are listed in the Patient Instructions below. Patient Instructions  Medication Instructions:  Your physician has recommended you make the following change in your medication:  1.  STOP the Amlodipine 2.  INCREASE the Lopressor to 50 mg twice a day  If you need a refill on your cardiac medications before your next appointment, please call your pharmacy.   Lab work: TODAY:  CBC, BMET, & TSH  If you have labs (blood work) drawn today and your tests are completely normal, you will receive your results only by: Marland Kitchen MyChart Message (if you have MyChart) OR . A paper copy in the mail If you have any lab test that is abnormal or we need to change your treatment, we will call you to review the results.  Testing/Procedures: None ordered  Follow-Up: At [facility], you and your health needs are our priority.  As part of our continuing mission to provide you with exceptional heart care, we have created designated Provider Care Teams.  These Care Teams include your primary Cardiologist (physician) and Advanced Practice Providers (APPs -  Physician Assistants and Nurse Practitioners) who all work together to provide you with the care you need, when you need it. You will need a follow up appointment in:  1st AVAILABLE WITH DR. Elberta Fortis AS A NEW PT FOR AFIB AND TO DISCUSS TIKOSYN  Your physician recommends  that you schedule a follow-up appointment in: 1 WEEK IN THE AFIB CLINIC WITH Sebastian Ache, NP   Any Other Special Instructions  Will Be Listed Below (If Applicable).       Lorelei Pont, [place]  08/07/2018 9:54 AM    [facility] [address] [place], [place], [place]  [address] Phone: [phone]; Fax: [phone]

## 2018-08-07 NOTE — Patient Instructions (Addendum)
Medication Instructions:  Your physician has recommended you make the following change in your medication:  1.  STOP the Amlodipine 2.  INCREASE the Lopressor to 50 mg twice a day  If you need a refill on your cardiac medications before your next appointment, please call your pharmacy.   Lab work: TODAY:  CBC, BMET, & TSH  If you have labs (blood work) drawn today and your tests are completely normal, you will receive your results only by: Marland Kitchen MyChart Message (if you have MyChart) OR . A paper copy in the mail If you have any lab test that is abnormal or we need to change your treatment, we will call you to review the results.  Testing/Procedures: None ordered  Follow-Up: At [facility], you and your health needs are our priority.  As part of our continuing mission to provide you with exceptional heart care, we have created designated Provider Care Teams.  These Care Teams include your primary Cardiologist (physician) and Advanced Practice Providers (APPs -  Physician Assistants and Nurse Practitioners) who all work together to provide you with the care you need, when you need it. You will need a follow up appointment in:  1st AVAILABLE WITH DR. Elberta Morales AS A NEW PT FOR AFIB AND TO DISCUSS TIKOSYN  Your physician recommends that you schedule a follow-up appointment in: 1 WEEK IN THE AFIB CLINIC WITH Regina Ache, NP   Any Other Special Instructions Will Be Listed Below (If Applicable).

## 2018-08-10 ENCOUNTER — Telehealth: Payer: Self-pay | Admitting: Physician Assistant

## 2018-08-10 NOTE — Telephone Encounter (Signed)
-----   Message from [place], [place] sent at 08/10/2018  6:37 AM EDT ----- All values are normal or within acceptable limits.   Medication changes / Follow up labs / Other changes or recommendations:    Follow up in afib clinic as schedule.    [place], [place] 08/10/2018 6:36 AM

## 2018-08-10 NOTE — Telephone Encounter (Signed)
Returned pts call.  She has been made aware of her lab results and she verbalized understanding.

## 2018-08-10 NOTE — Telephone Encounter (Signed)
New Message:    Patient is calling for labs results

## 2018-08-13 ENCOUNTER — Encounter: Payer: Self-pay | Admitting: *Deleted

## 2018-08-17 ENCOUNTER — Ambulatory Visit (HOSPITAL_COMMUNITY)
Admission: RE | Admit: 2018-08-17 | Discharge: 2018-08-17 | Disposition: A | Discharge status: Home / Self Care | Payer: Managed Care, Other (non HMO) | Source: Ambulatory Visit | Attending: Nurse Practitioner | Admitting: Nurse Practitioner

## 2018-08-17 ENCOUNTER — Encounter (HOSPITAL_COMMUNITY): Payer: Self-pay | Admitting: Nurse Practitioner

## 2018-08-17 VITALS — BP 129/74 | HR 69 | Ht 67.0 in | Wt 305.0 lb

## 2018-08-17 DIAGNOSIS — Z79899 Other long term (current) drug therapy: Secondary | ICD-10-CM | POA: Diagnosis not present

## 2018-08-17 DIAGNOSIS — M797 Fibromyalgia: Secondary | ICD-10-CM | POA: Insufficient documentation

## 2018-08-17 DIAGNOSIS — G4733 Obstructive sleep apnea (adult) (pediatric): Secondary | ICD-10-CM | POA: Diagnosis not present

## 2018-08-17 DIAGNOSIS — I10 Essential (primary) hypertension: Secondary | ICD-10-CM | POA: Insufficient documentation

## 2018-08-17 DIAGNOSIS — Z9889 Other specified postprocedural states: Secondary | ICD-10-CM | POA: Diagnosis not present

## 2018-08-17 DIAGNOSIS — I480 Paroxysmal atrial fibrillation: Secondary | ICD-10-CM | POA: Diagnosis not present

## 2018-08-17 DIAGNOSIS — Z9851 Tubal ligation status: Secondary | ICD-10-CM | POA: Diagnosis not present

## 2018-08-17 DIAGNOSIS — Z7901 Long term (current) use of anticoagulants: Secondary | ICD-10-CM | POA: Insufficient documentation

## 2018-08-17 DIAGNOSIS — Z87891 Personal history of nicotine dependence: Secondary | ICD-10-CM | POA: Diagnosis not present

## 2018-08-17 NOTE — Progress Notes (Signed)
Primary Care Physician: Clovis Riley, L.August Saucer, MD Referring Physician: Dr. Nishan   Regina Morales is a 57 y.o. female with a h/o paroxysmal atrial fibrillation that is on flecainide for a visit in the afib clinic to discuss more frequent afib episodes. This occurs during th day but a lot at night. She is using  cpap   She is avoiding caffeine, no alcohol. Struggles with weight loss. Vin Bhagat, PA, increased her BB on last visit there and since then afib has been quiet. I hesitate to increase flecainide to 100 mg bid for presence of a first degree AVB as well as IRBBB.   Today, she denies symptoms of palpitations, chest pain, shortness of breath, orthopnea, PND, lower extremity edema, dizziness, presyncope, syncope, or neurologic sequela. The patient is tolerating medications without difficulties and is otherwise without complaint today.   Past Medical History:  Diagnosis Date  . Anemia    hx years ago  . Buzzing in ear    "right; even after OR"  . Chest pain    a. 2009: neg MV  ([facility])  . Depression   . DVT (deep vein thrombosis) in pregnancy 1980's   LLE  . Fibromyalgia    "dx'd many many years ago; I haven't had any problems w/it" (04/15/2017)  . GERD (gastroesophageal reflux disease)   . Hepatic steatosis   . High cholesterol   . History of blood transfusion    "low count after appendix surgery" unsure # of units transfused  . Hypertension   . Migraines    "maybe once/2 months" (04/15/2017)  . Obesity   . OSA on CPAP   . Persistent atrial fibrillation    a. Dx 07/2013;  b. CHA2DS2VASc=1 (female);  c. 07/2013 Echo:  EF 50-55%, no rwma, mod LVH.  . Pulmonary embolism (HCC) ~ 1984   "after I had my last child"  . SVD (spontaneous vaginal delivery)    x 4   Past Surgical History:  Procedure Laterality Date  . APPENDECTOMY  1990's  . COLONOSCOPY    . DILATATION & CURETTAGE/HYSTEROSCOPY WITH MYOSURE N/A 04/14/2017   Procedure: DILATATION & CURETTAGE/HYSTEROSCOPY WITH MYOSURE;   Surgeon: Myna Hidalgo, DO;  Location: [facility];  Service: Gynecology;  Laterality: N/A;  Polypectomy  . DILATION AND CURETTAGE OF UTERUS  1983   mab  . INNER EAR SURGERY Right 2000's  . LEFT HEART CATH AND CORONARY ANGIOGRAPHY N/A 04/17/2017   Procedure: Left Heart Cath and Coronary Angiography;  Surgeon: Corky Crafts, MD;  Location: [facility] INVASIVE CV LAB;  Service: Cardiovascular;  Laterality: N/A;  . PLANTAR FASCIA RELEASE Right 07/2015  . TUBAL LIGATION  1985    Current Outpatient Medications  Medication Sig Dispense Refill  . apixaban (ELIQUIS) 5 MG TABS tablet Take 1 tablet (5 mg total) by mouth 2 (two) times daily. 180 tablet 1  . atorvastatin (LIPITOR) 20 MG tablet Take 1 tablet (20 mg total) by mouth daily at 6 PM. 30 tablet 9  . buPROPion (WELLBUTRIN XL) 300 MG 24 hr tablet Take 300 mg by mouth at bedtime.     Marland Kitchen diltiazem (CARDIZEM) 30 MG tablet Take 1 tablet every 4 hours AS NEEDED for AFIB heart rate >100 45 tablet 1  . flecainide (TAMBOCOR) 50 MG tablet Take 1 tablet (50 mg total) by mouth 2 (two) times daily. 60 tablet 6  . imipramine (TOFRANIL) 25 MG tablet Take 25 mg by mouth at bedtime.     . metoprolol tartrate (LOPRESSOR) 50  MG tablet Take 1 tablet (50 mg total) by mouth 2 (two) times daily. 180 tablet 3  . Multiple Vitamins-Minerals (MULTIVITAMIN WITH MINERALS) tablet Take 1 tablet by mouth daily. 50 + Vitamin    . ranitidine (ZANTAC) 300 MG tablet Take 300 mg by mouth at bedtime.     No current facility-administered medications for this encounter.     Allergies  Allergen Reactions  . Codeine Nausea And Vomiting    Social History   Socioeconomic History  . Marital status: Legally Separated    Spouse name: Not on file  . Number of children: 1  . Years of education: Not on file  . Highest education level: Not on file  Occupational History  . Not on file  Social Needs  . Financial resource strain: Not on file  . Food insecurity:    Worry: Not on file     Inability: Not on file  . Transportation needs:    Medical: Not on file    Non-medical: Not on file  Tobacco Use  . Smoking status: Former Smoker    Packs/day: 1.00    Years: 8.00    Pack years: 8.00    Types: Cigarettes    Last attempt to quit: 11/04/1990    Years since quitting: 27.8  . Smokeless tobacco: Never Used  Substance and Sexual Activity  . Alcohol use: No  . Drug use: No  . Sexual activity: Not Currently    Birth control/protection: Post-menopausal  Lifestyle  . Physical activity:    Days per week: Not on file    Minutes per session: Not on file  . Stress: Not on file  Relationships  . Social connections:    Talks on phone: Not on file    Gets together: Not on file    Attends religious service: Not on file    Active member of club or organization: Not on file    Attends meetings of clubs or organizations: Not on file    Relationship status: Not on file  . Intimate partner violence:    Fear of current or ex partner: Not on file    Emotionally abused: Not on file    Physically abused: Not on file    Forced sexual activity: Not on file  Other Topics Concern  . Not on file  Social History Narrative   Lives in [place] with sister.  Does not routinely exercise.  Works in Teacher, early years/pre for [employer].    Family History  Problem Relation Age of Onset  . Lung cancer Father   . Alcohol abuse Father   . Kidney failure Mother        s/p renal Tx  . Congestive Heart Failure Mother   . Atrial fibrillation Brother   . Sudden Cardiac Death Daughter        cardiac arrest  . Cardiomyopathy Daughter   . Evelene Croon Parkinson White syndrome Grandchild         granddaughter    ROS- All systems are reviewed and negative except as per the HPI above  Physical Exam: Vitals:   08/17/18 1533  BP: 129/74  Pulse: 69  SpO2: 99%  Weight: (!) 138.3 kg  Height: 5\' 7"  (1.702 m)   Wt Readings from Last 3 Encounters:  08/17/18 (!) 138.3 kg  08/07/18 (!) 137 kg  12/25/17 (!) 141.1 kg     Labs: Lab Results  Component Value Date   NA 139 08/07/2018   K 4.2 08/07/2018   CL  100 08/07/2018   CO2 22 08/07/2018   GLUCOSE 92 08/07/2018   BUN 14 08/07/2018   CREATININE 0.93 08/07/2018   CALCIUM 9.7 08/07/2018   MG 2.0 04/15/2017   Lab Results  Component Value Date   INR 1.11 04/15/2017   Lab Results  Component Value Date   CHOL 186 04/23/2014   HDL 53 04/23/2014   LDLCALC 109 (H) 04/23/2014   TRIG 122 04/23/2014     GEN- The patient is well appearing, alert and oriented x 3 today.   Head- normocephalic, atraumatic Eyes-  Sclera clear, conjunctiva pink Ears- hearing intact Oropharynx- clear Neck- supple, no JVP Lymph- no cervical lymphadenopathy Lungs- Clear to ausculation bilaterally, normal work of breathing Heart- Regular rate and rhythm, no murmurs, rubs or gallops, PMI not laterally displaced GI- soft, NT, ND, + BS Extremities- no clubbing, cyanosis, or edema MS- no significant deformity or atrophy Skin- no rash or lesion Psych- euthymic mood, full affect Neuro- strength and sensation are intact  EKG-NSR at 69 bpm, IRBBB, first degree AVB, pr int 222 ms, qrs int 98 ms, qtc 428 ms Epic records reviewed Echo-04/17/17-Study Conclusions  - Left ventricle: The cavity size was normal. Systolic function was   normal. The estimated ejection fraction was in the range of 55%   to 60%. Wall motion was normal; there were no regional wall   motion abnormalities. - Aortic valve: There was trivial regurgitation. - Left atrium: The atrium was mildly dilated.     Assessment and Plan: 1. Paroxysmal afib Recent increase in afib burden, but quiet after increase of BB Hesitate to increase flecainide to 100 mg bid for fear of worsening  interval changes Continue to use CPAP, avoid caffeine F/u with Dr. Elberta Fortis to see if ablation candidate vrs change in antiarrythmic Weight is a concern for maintaining SR in the long run  F/u with Dr. Elberta Fortis 10/24 afib  clinic as needed  Lupita Leash [facility] [address] [place], [place] [address] [phone]

## 2018-08-17 NOTE — Addendum Note (Signed)
Encounter addended by: Newman Nip, NP on: 08/17/2018 3:58 PM  Actions taken: Sign clinical note

## 2018-08-27 ENCOUNTER — Encounter: Payer: Self-pay | Admitting: Cardiology

## 2018-08-27 ENCOUNTER — Ambulatory Visit (INDEPENDENT_AMBULATORY_CARE_PROVIDER_SITE_OTHER): Payer: Managed Care, Other (non HMO) | Admitting: Cardiology

## 2018-08-27 VITALS — BP 122/76 | HR 80 | Ht 67.0 in | Wt 307.0 lb

## 2018-08-27 DIAGNOSIS — G4733 Obstructive sleep apnea (adult) (pediatric): Secondary | ICD-10-CM

## 2018-08-27 DIAGNOSIS — I480 Paroxysmal atrial fibrillation: Secondary | ICD-10-CM | POA: Diagnosis not present

## 2018-08-27 DIAGNOSIS — I10 Essential (primary) hypertension: Secondary | ICD-10-CM

## 2018-08-27 NOTE — Progress Notes (Signed)
Electrophysiology Office Note   Date:  08/27/2018   ID:  Regina Morales, DOB [birth year], MRN [id number]  PCP:  Asencion Gowda.August Saucer, MD  Cardiologist:  Eden Emms Primary Electrophysiologist:  Adryan Shin Jorja Loa, MD    No chief complaint on file.    History of Present Illness: Regina Morales is a 57 y.o. female who is being seen today for the evaluation of atrial fibrillation at the request of Regina Morales. Presenting today for electrophysiology evaluation.  She has a history of paroxysmal atrial fibrillation, hypertension, OSA on CPAP.  He takes flecainide for her atrial fibrillation, but has been having more frequent episodes.  She is avoiding caffeine and alcohol.  She does struggle with weight loss.  She has been in atrial fibrillation once since the increased dose of her beta-blocker.  This lasted for 24 hours.  She has extreme weakness, fatigue, and shortness of breath when this happens.  She also gets quite anxious, and has panic attacks.  He does tell me today that she is somewhat noncompliant with her CPAP.  Today, she denies symptoms of palpitations, chest pain, shortness of breath, orthopnea, PND, lower extremity edema, claudication, dizziness, presyncope, syncope, bleeding, or neurologic sequela. The patient is tolerating medications without difficulties.    Past Medical History:  Diagnosis Date  . Anemia    hx years ago  . Buzzing in ear    "right; even after OR"  . Chest pain    a. 2009: neg MV  ([facility])  . Depression   . DVT (deep vein thrombosis) in pregnancy 1980's   LLE  . Fibromyalgia    "dx'd many many years ago; I haven't had any problems w/it" (04/15/2017)  . GERD (gastroesophageal reflux disease)   . Hepatic steatosis   . High cholesterol   . History of blood transfusion    "low count after appendix surgery" unsure # of units transfused  . Hypertension   . Migraines    "maybe once/2 months" (04/15/2017)  . Obesity   . OSA on CPAP   . Persistent atrial  fibrillation    a. Dx 07/2013;  b. CHA2DS2VASc=1 (female);  c. 07/2013 Echo:  EF 50-55%, no rwma, mod LVH.  . Pulmonary embolism (HCC) ~ 1984   "after I had my last child"  . SVD (spontaneous vaginal delivery)    x 4   Past Surgical History:  Procedure Laterality Date  . APPENDECTOMY  1990's  . COLONOSCOPY    . DILATATION & CURETTAGE/HYSTEROSCOPY WITH MYOSURE N/A 04/14/2017   Procedure: DILATATION & CURETTAGE/HYSTEROSCOPY WITH MYOSURE;  Surgeon: Myna Hidalgo, DO;  Location: [facility];  Service: Gynecology;  Laterality: N/A;  Polypectomy  . DILATION AND CURETTAGE OF UTERUS  1983   mab  . INNER EAR SURGERY Right 2000's  . LEFT HEART CATH AND CORONARY ANGIOGRAPHY N/A 04/17/2017   Procedure: Left Heart Cath and Coronary Angiography;  Surgeon: Corky Crafts, MD;  Location: [facility] INVASIVE CV LAB;  Service: Cardiovascular;  Laterality: N/A;  . PLANTAR FASCIA RELEASE Right 07/2015  . TUBAL LIGATION  1985     Current Outpatient Medications  Medication Sig Dispense Refill  . apixaban (ELIQUIS) 5 MG TABS tablet Take 1 tablet (5 mg total) by mouth 2 (two) times daily. 180 tablet 1  . atorvastatin (LIPITOR) 20 MG tablet Take 1 tablet (20 mg total) by mouth daily at 6 PM. 30 tablet 9  . buPROPion (WELLBUTRIN XL) 300 MG 24 hr tablet Take 300 mg by mouth at bedtime.     Marland Kitchen  diltiazem (CARDIZEM) 30 MG tablet Take 1 tablet every 4 hours AS NEEDED for AFIB heart rate >100 45 tablet 1  . flecainide (TAMBOCOR) 50 MG tablet Take 1 tablet (50 mg total) by mouth 2 (two) times daily. 60 tablet 6  . imipramine (TOFRANIL) 25 MG tablet Take 25 mg by mouth at bedtime.     . metoprolol tartrate (LOPRESSOR) 50 MG tablet Take 1 tablet (50 mg total) by mouth 2 (two) times daily. 180 tablet 3  . Multiple Vitamins-Minerals (MULTIVITAMIN WITH MINERALS) tablet Take 1 tablet by mouth daily. 50 + Vitamin    . ranitidine (ZANTAC) 300 MG tablet Take 300 mg by mouth at bedtime.     No current facility-administered medications  for this visit.     Allergies:   Codeine   Social History:  The patient  reports that she quit smoking about 27 years ago. Her smoking use included cigarettes. She has a 8.00 pack-year smoking history. She has never used smokeless tobacco. She reports that she does not drink alcohol or use drugs.   Family History:  The patient's family history includes Alcohol abuse in her father; Atrial fibrillation in her brother; Cardiomyopathy in her daughter; Congestive Heart Failure in her mother; Kidney failure in her mother; Lung cancer in her father; Sudden Cardiac Death in her daughter; Wolff Parkinson White syndrome in her grandchild.    ROS:  Please see the history of present illness.   Otherwise, review of systems is positive for sweats, fatigue, palpitations, leg swelling, hearing loss, shortness of breath, diarrhea, depression, back pain, easy bruising, headaches.   All other systems are reviewed and negative.    PHYSICAL EXAM: VS:  BP 122/76   Pulse 80   Ht 5\' 7"  (1.702 m)   Wt (!) 307 lb (139.3 kg)   BMI 48.08 kg/m  , BMI Body mass index is 48.08 kg/m. GEN: Well nourished, well developed, in no acute distress  HEENT: normal  Neck: no JVD, carotid bruits, or masses Cardiac: RRR; no murmurs, rubs, or gallops,no edema  Respiratory:  clear to auscultation bilaterally, normal work of breathing GI: soft, nontender, nondistended, + BS MS: no deformity or atrophy  Skin: warm and dry Neuro:  Strength and sensation are intact Psych: euthymic mood, full affect  EKG:  EKG is ordered today. Personal review of the ekg ordered shows this rhythm, first-degree AV block, rate 80  Recent Labs: 08/07/2018: BUN 14; Creatinine, Ser 0.93; Hemoglobin 12.6; Platelets 335; Potassium 4.2; Sodium 139; TSH 1.000    Lipid Panel     Component Value Date/Time   CHOL 186 04/23/2014 0018   TRIG 122 04/23/2014 0018   HDL 53 04/23/2014 0018   CHOLHDL 3.5 04/23/2014 0018   VLDL 24 04/23/2014 0018   LDLCALC  109 (H) 04/23/2014 0018     Wt Readings from Last 3 Encounters:  08/27/18 (!) 307 lb (139.3 kg)  08/17/18 (!) 305 lb (138.3 kg)  08/07/18 (!) 302 lb 1.9 oz (137 kg)      Other studies Reviewed: Additional studies/ records that were reviewed today include: TTE 04/17/17  Review of the above records today demonstrates:  - Left ventricle: The cavity size was normal. Systolic function was   normal. The estimated ejection fraction was in the range of 55%   to 60%. Wall motion was normal; there were no regional wall   motion abnormalities. - Aortic valve: There was trivial regurgitation. - Left atrium: The atrium was mildly dilated.  LHC 04/17/17  The left ventricular systolic function is normal.  LV end diastolic pressure is moderately elevated. LVEDP 25 mm Hg.  The left ventricular ejection fraction is 50-55% by visual estimate.  There is no aortic valve stenosis.  No angiographically apparent coronary artery disease.   ASSESSMENT AND PLAN:  1.  Paroxysmal atrial fibrillation: Has been having an increased burden, but this has been improved with increased dose of her beta-blocker.  She is currently on flecainide 50 mg twice a day.  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  I did talk to her about the option of Multitak or dofetilide.  At this point, she would like to move forward with dofetilide loading in the hospital.  This patients CHA2DS2-VASc Score and unadjusted Ischemic Stroke Rate (% per year) is equal to 2.2 % stroke rate/year from a score of 2  Above score calculated as 1 point each if present [CHF, HTN, DM, Vascular=MI/PAD/Aortic Plaque, Age if 65-74, or Female] Above score calculated as 2 points each if present [Age > 75, or Stroke/TIA/TE]  2.  Obstructive sleep apnea: Encouraged CPAP compliance  3.  Hypertension: Well-controlled.  No changes.  Current medicines are reviewed at  length with the patient today.   The patient does not have concerns regarding her medicines.  The following changes were made today:  none  Labs/ tests ordered today include:  Orders Placed This Encounter  Procedures  . EKG 12-Lead   Case discussed with primary cardiologist  Disposition:   FU with Wentworth Edelen 3 months  Signed, Corra Kaine Jorja Loa, MD  08/27/2018 9:27 AM     [facility] [address] [place] [address] [phone] (office) [phone] (fax)

## 2018-08-27 NOTE — Patient Instructions (Signed)
Medication Instructions:  Your physician recommends that you continue on your current medications as directed. Please refer to the Current Medication list given to you today.  If you need a refill on your cardiac medications before your next appointment, please call your pharmacy.   Lab work: None ordered  Testing/Procedures: None ordered   Follow-Up: . To be determined  Please call and check on the cost of Dofetilide.   Let Dory Horn, RN know if it is affordable:  [phone]   Any Other Special Instructions Will Be Listed Below (If Applicable).  Dofetilide capsules What is this medicine? DOFETILIDE (doe FET il ide) is an antiarrhythmic drug. It helps make your heart beat regularly. This medicine also helps to slow rapid heartbeats. This medicine may be used for other purposes; ask your health care provider or pharmacist if you have questions. COMMON BRAND NAME(S): Tikosyn What should I tell my health care provider before I take this medicine? They need to know if you have any of these conditions: -heart disease -history of low levels of potassium or magnesium -kidney disease -liver disease -an unusual or allergic reaction to dofetilide, other medicines, foods, dyes, or preservatives -pregnant or trying to get pregnant -breast-feeding How should I use this medicine? Take this medicine by mouth with a glass of water. Follow the directions on the prescription label. You can take this medicine with or without food. Do not drink grapefruit juice with this medicine. Take your doses at regular intervals. Do not take your medicine more often than directed. Do not stop taking this medicine suddenly. This may cause serious, heart-related side effects. Your doctor will tell you how much medicine to take. If your doctor wants you to stop the medicine, the dose will be slowly lowered over time to avoid any side effects. Talk to your pediatrician regarding the use of this medicine in  children. Special care may be needed. Overdosage: If you think you have taken too much of this medicine contact a poison control center or emergency room at once. NOTE: This medicine is only for you. Do not share this medicine with others. What if I miss a dose? If you miss a dose, take it as soon as you can. If it is almost time for your next dose, take only that dose. Do not take double or extra doses. What may interact with this medicine? Do not take this medicine with any of the following medications: -cimetidine -dolutegravir -hydrochlorothiazide alone or in combination with other medicines -isavuconazonium -ketoconazole -megestrol -other medicines that prolong the QT interval (cause an abnormal heart rhythm) -prochlorperazine -trimethoprim alone or in combination with sulfamethoxazole -verapamil This medicine may also interact with the following medications: -amiloride -certain antidepressants like fluvoxamine or paroxetine -certain antiviral medicines for HIV or AIDS like atazanavir or darunavir -certain medicines for fungal infections like clotrimazole or miconazole -digoxin -diltiazem -dronabinol, THC -grapefruit juice -metformin -nefazodone -triamterene -zafirlukast This list may not describe all possible interactions. Give your health care provider a list of all the medicines, herbs, non-prescription drugs, or dietary supplements you use. Also tell them if you smoke, drink alcohol, or use illegal drugs. Some items may interact with your medicine. What should I watch for while using this medicine? Visit your doctor or health care professional for regular checks on your progress. Wear a medical ID bracelet or chain, and carry a card that describes your disease and details of your medicine and dosage times. Check your heart rate and blood pressure regularly while you are  taking this medicine. Ask your doctor or health care professional what your heart rate and blood pressure  should be, and when you should contact him or her. Your doctor or health care professional also may schedule regular tests to check your progress. You will be started on this medicine in a specialized facility for at least three days. You will be monitored to find the right dose of medicine for you. It is very important that you take your medicine exactly as prescribed when you leave the hospital. The correct dosing of this medicine is very important to treat your condition and prevent possible serious side effects. What side effects may I notice from receiving this medicine? Side effects that you should report to your doctor or health care professional as soon as possible: -allergic reactions like skin rash, itching or hives, swelling of the face, lips, or tongue -breathing problems -dizziness -fast or rapid beating of the heart -feeling faint or lightheaded -swelling of the ankles -unusually weak or tired -vomiting Side effects that usually do not require medical attention (report to your doctor or health care professional if they continue or are bothersome): -cough -diarrhea -difficulty sleeping -headache -nausea -stomach pain This list may not describe all possible side effects. Call your doctor for medical advice about side effects. You may report side effects to FDA at 1-800-FDA-1088. Where should I keep my medicine? Keep out of the reach of children. Store at room temperature between 15 and 30 degrees C (59 and 86 degrees F). Protect the medicine from moisture or humidity. Keep container tightly closed. Throw away any unused medicine after the expiration date. NOTE: This sheet is a summary. It may not cover all possible information. If you have questions about this medicine, talk to your doctor, pharmacist, or health care provider.  2018 Elsevier/Gold Standard (2016-09-02 16:10:96)

## 2018-08-28 NOTE — Telephone Encounter (Signed)
Pt returned call.  She is ready to begin Tikosyn. Pt aware AFib clinic will call her to arrange adx. Pt agreeable to plan.

## 2018-08-31 ENCOUNTER — Telehealth: Payer: Self-pay | Admitting: Pharmacist

## 2018-08-31 NOTE — Telephone Encounter (Signed)
Patient notified to stop flecainide 3 days prior to [facility] admission. Pt verbalized understanding.

## 2018-08-31 NOTE — Telephone Encounter (Signed)
Medication list reviewed in anticipation of upcoming Tikosyn initiation. Patient is currently taking flecainide which will need to be discontinued at least 2 days prior to admission. Pt is not taking any contraindicated or QTc prolonging medications.   Patient is anticoagulated on Eliquis on the appropriate dose. Please ensure that patient has not missed any anticoagulation doses in the 3 weeks prior to Tikosyn initiation.   Patient will need to be counseled to avoid use of Benadryl while on Tikosyn and in the 2-3 days prior to Tikosyn initiation.

## 2018-09-15 ENCOUNTER — Other Ambulatory Visit: Payer: Self-pay

## 2018-09-15 ENCOUNTER — Ambulatory Visit (HOSPITAL_COMMUNITY)
Admission: RE | Admit: 2018-09-15 | Discharge: 2018-09-15 | Disposition: A | Discharge status: Home / Self Care | Payer: Managed Care, Other (non HMO) | Source: Ambulatory Visit | Attending: Nurse Practitioner | Admitting: Nurse Practitioner

## 2018-09-15 ENCOUNTER — Inpatient Hospital Stay (HOSPITAL_COMMUNITY)
Admission: AD | Admit: 2018-09-15 | Discharge: 2018-09-18 | DRG: 309 | Disposition: A | Discharge status: Home / Self Care | Payer: Managed Care, Other (non HMO) | Source: Ambulatory Visit | Attending: Cardiology | Admitting: Cardiology

## 2018-09-15 ENCOUNTER — Encounter (HOSPITAL_COMMUNITY): Payer: Self-pay | Admitting: Nurse Practitioner

## 2018-09-15 VITALS — BP 128/84 | HR 76 | Ht 67.0 in | Wt 304.0 lb

## 2018-09-15 DIAGNOSIS — K219 Gastro-esophageal reflux disease without esophagitis: Secondary | ICD-10-CM | POA: Diagnosis present

## 2018-09-15 DIAGNOSIS — M797 Fibromyalgia: Secondary | ICD-10-CM | POA: Diagnosis present

## 2018-09-15 DIAGNOSIS — Z6841 Body Mass Index (BMI) 40.0 and over, adult: Secondary | ICD-10-CM | POA: Diagnosis not present

## 2018-09-15 DIAGNOSIS — Z811 Family history of alcohol abuse and dependence: Secondary | ICD-10-CM

## 2018-09-15 DIAGNOSIS — I10 Essential (primary) hypertension: Secondary | ICD-10-CM | POA: Diagnosis present

## 2018-09-15 DIAGNOSIS — Z885 Allergy status to narcotic agent status: Secondary | ICD-10-CM | POA: Diagnosis not present

## 2018-09-15 DIAGNOSIS — Z86718 Personal history of other venous thrombosis and embolism: Secondary | ICD-10-CM | POA: Diagnosis not present

## 2018-09-15 DIAGNOSIS — I480 Paroxysmal atrial fibrillation: Secondary | ICD-10-CM | POA: Diagnosis not present

## 2018-09-15 DIAGNOSIS — Z23 Encounter for immunization: Secondary | ICD-10-CM

## 2018-09-15 DIAGNOSIS — K760 Fatty (change of) liver, not elsewhere classified: Secondary | ICD-10-CM | POA: Diagnosis present

## 2018-09-15 DIAGNOSIS — Z5181 Encounter for therapeutic drug level monitoring: Secondary | ICD-10-CM

## 2018-09-15 DIAGNOSIS — E7800 Pure hypercholesterolemia, unspecified: Secondary | ICD-10-CM | POA: Diagnosis present

## 2018-09-15 DIAGNOSIS — G4733 Obstructive sleep apnea (adult) (pediatric): Secondary | ICD-10-CM | POA: Diagnosis present

## 2018-09-15 DIAGNOSIS — F419 Anxiety disorder, unspecified: Secondary | ICD-10-CM | POA: Diagnosis present

## 2018-09-15 DIAGNOSIS — Z87891 Personal history of nicotine dependence: Secondary | ICD-10-CM | POA: Diagnosis not present

## 2018-09-15 DIAGNOSIS — I4819 Other persistent atrial fibrillation: Secondary | ICD-10-CM | POA: Diagnosis not present

## 2018-09-15 DIAGNOSIS — E785 Hyperlipidemia, unspecified: Secondary | ICD-10-CM | POA: Diagnosis present

## 2018-09-15 DIAGNOSIS — Z86711 Personal history of pulmonary embolism: Secondary | ICD-10-CM

## 2018-09-15 DIAGNOSIS — F329 Major depressive disorder, single episode, unspecified: Secondary | ICD-10-CM | POA: Diagnosis present

## 2018-09-15 DIAGNOSIS — Z79899 Other long term (current) drug therapy: Secondary | ICD-10-CM | POA: Diagnosis not present

## 2018-09-15 DIAGNOSIS — Z7901 Long term (current) use of anticoagulants: Secondary | ICD-10-CM

## 2018-09-15 LAB — BASIC METABOLIC PANEL
Anion gap: 9 (ref 5–15)
BUN: 11 mg/dL (ref 6–20)
CHLORIDE: 105 mmol/L (ref 98–111)
CO2: 24 mmol/L (ref 22–32)
Calcium: 9.2 mg/dL (ref 8.9–10.3)
Creatinine, Ser: 1.09 mg/dL — ABNORMAL HIGH (ref 0.44–1.00)
GFR calc non Af Amer: 56 mL/min — ABNORMAL LOW (ref >60)
Glucose, Bld: 84 mg/dL (ref 70–99)
Potassium: 4.3 mmol/L (ref 3.5–5.1)
Sodium: 138 mmol/L (ref 135–145)

## 2018-09-15 LAB — MAGNESIUM: Magnesium: 2.2 mg/dL (ref 1.7–2.4)

## 2018-09-15 MED ORDER — SODIUM CHLORIDE 0.9 % IV SOLN: 250.0000 mL | INTRAVENOUS | Status: DC | PRN

## 2018-09-15 MED ORDER — IMIPRAMINE HCL 25 MG PO TABS
25.0000 mg | ORAL_TABLET | Freq: Every day | ORAL | Status: DC
  Administered 2018-09-15 – 2018-09-17 (×3): 25 mg via ORAL
  Filled 2018-09-15 (×3): qty 1

## 2018-09-15 MED ORDER — DOFETILIDE 500 MCG PO CAPS: 500.0000 ug | ORAL_CAPSULE | Freq: Two times a day (BID) | ORAL | Status: DC

## 2018-09-15 MED ORDER — APIXABAN 5 MG PO TABS
5.0000 mg | ORAL_TABLET | Freq: Two times a day (BID) | ORAL | Status: DC
  Administered 2018-09-15 – 2018-09-18 (×6): 5 mg via ORAL
  Filled 2018-09-15 (×6): qty 1

## 2018-09-15 MED ORDER — SODIUM CHLORIDE 0.9% FLUSH
3.0000 mL | Freq: Two times a day (BID) | INTRAVENOUS | Status: DC
  Administered 2018-09-15 – 2018-09-18 (×6): 3 mL via INTRAVENOUS

## 2018-09-15 MED ORDER — SODIUM CHLORIDE 0.9% FLUSH: 3.0000 mL | INTRAVENOUS | Status: DC | PRN

## 2018-09-15 MED ORDER — METOPROLOL TARTRATE 50 MG PO TABS
50.0000 mg | ORAL_TABLET | Freq: Two times a day (BID) | ORAL | Status: DC
  Administered 2018-09-15 – 2018-09-18 (×6): 50 mg via ORAL
  Filled 2018-09-15 (×6): qty 1

## 2018-09-15 MED ORDER — ATORVASTATIN CALCIUM 20 MG PO TABS
20.0000 mg | ORAL_TABLET | Freq: Every day | ORAL | Status: DC
  Administered 2018-09-16 – 2018-09-17 (×2): 20 mg via ORAL
  Filled 2018-09-15 (×2): qty 1

## 2018-09-15 MED ORDER — DOFETILIDE 500 MCG PO CAPS
500.0000 ug | ORAL_CAPSULE | Freq: Two times a day (BID) | ORAL | Status: DC
  Administered 2018-09-15 – 2018-09-18 (×6): 500 ug via ORAL
  Filled 2018-09-15 (×6): qty 1

## 2018-09-15 MED ORDER — ADULT MULTIVITAMIN W/MINERALS CH
1.0000 | ORAL_TABLET | Freq: Every day | ORAL | Status: DC
  Administered 2018-09-16 – 2018-09-18 (×3): 1 via ORAL
  Filled 2018-09-15 (×3): qty 1

## 2018-09-15 MED ORDER — FAMOTIDINE 20 MG PO TABS
40.0000 mg | ORAL_TABLET | Freq: Every day | ORAL | Status: DC
  Administered 2018-09-15 – 2018-09-17 (×3): 40 mg via ORAL
  Filled 2018-09-15 (×3): qty 2

## 2018-09-15 MED ORDER — INFLUENZA VAC SPLIT QUAD 0.5 ML IM SUSY: 0.5000 mL | PREFILLED_SYRINGE | INTRAMUSCULAR | Status: DC

## 2018-09-15 MED ORDER — BUPROPION HCL ER (XL) 150 MG PO TB24
300.0000 mg | ORAL_TABLET | Freq: Every day | ORAL | Status: DC
  Administered 2018-09-15 – 2018-09-17 (×3): 300 mg via ORAL
  Filled 2018-09-15 (×3): qty 2

## 2018-09-15 MED ORDER — APIXABAN 5 MG PO TABS: 5.0000 mg | ORAL_TABLET | Freq: Two times a day (BID) | ORAL | Status: DC

## 2018-09-15 NOTE — H&P (Addendum)
Cardiology Admission History and Physical:   Patient ID: Regina Morales MRN: [id number]; DOB: [birth year]   Admission date: 09/15/2018  Primary Care Provider: Clovis Riley, L.August Saucer, MD Primary Cardiologist: [name], MD  Primary Electrophysiologist:  Will Jorja Loa, MD   Chief Complaint:  Joice Lofts intiation  Patient Profile:   Regina Morales is a 57 y.o. female with PMHx of fibromyalgia, HLD, OSA w/CPAP, DVT/PE (w/pregnancy), GERD, and AFib  History of Present Illness:   [name] *is admitted today for Tikosyn initiation.  She failed Flecainide with increasing frequency and duration of her AF episodes associate with weakness, fatigue and SOB.  She was seen by Dr. Elberta Fortis for rhythm control options, her flecainide was not up-titrated with baseline conduction disease and stopped last week in preparation for Tikosyn (last dose Friday 09/10/18)  She is on Eliquis for a/c, and reports compliance without missed doses for the last 3 weeks +. Her last dose of fl;ecainide was Friday evening, none for 3 days.  We discussed potential risks and benefots of Tikosyn, and Tikosyn protocol, she is agreeable to proceed.  Past Medical History:  Diagnosis Date  . Anemia    hx years ago  . Buzzing in ear    "right; even after OR"  . Chest pain    a. 2009: neg MV  ([facility])  . Depression   . DVT (deep vein thrombosis) in pregnancy 1980's   LLE  . Fibromyalgia    "dx'd many many years ago; I haven't had any problems w/it" (04/15/2017)  . GERD (gastroesophageal reflux disease)   . Hepatic steatosis   . High cholesterol   . History of blood transfusion    "low count after appendix surgery" unsure # of units transfused  . Hypertension   . Migraines    "maybe once/2 months" (04/15/2017)  . Obesity   . OSA on CPAP   . Persistent atrial fibrillation    a. Dx 07/2013;  b. CHA2DS2VASc=1 (female);  c. 07/2013 Echo:  EF 50-55%, no rwma, mod LVH.  . Pulmonary embolism (HCC) ~ 1984   "after I had  my last child"  . SVD (spontaneous vaginal delivery)    x 4    Past Surgical History:  Procedure Laterality Date  . APPENDECTOMY  1990's  . COLONOSCOPY    . DILATATION & CURETTAGE/HYSTEROSCOPY WITH MYOSURE N/A 04/14/2017   Procedure: DILATATION & CURETTAGE/HYSTEROSCOPY WITH MYOSURE;  Surgeon: Myna Hidalgo, DO;  Location: [facility];  Service: Gynecology;  Laterality: N/A;  Polypectomy  . DILATION AND CURETTAGE OF UTERUS  1983   mab  . INNER EAR SURGERY Right 2000's  . LEFT HEART CATH AND CORONARY ANGIOGRAPHY N/A 04/17/2017   Procedure: Left Heart Cath and Coronary Angiography;  Surgeon: Corky Crafts, MD;  Location: [facility] INVASIVE CV LAB;  Service: Cardiovascular;  Laterality: N/A;  . PLANTAR FASCIA RELEASE Right 07/2015  . TUBAL LIGATION  1985     Medications Prior to Admission: Prior to Admission medications   Medication Sig Start Date End Date Taking? Authorizing Provider  apixaban (ELIQUIS) 5 MG TABS tablet Take 1 tablet (5 mg total) by mouth 2 (two) times daily. 03/05/18   Wendall Stade, MD  atorvastatin (LIPITOR) 20 MG tablet Take 1 tablet (20 mg total) by mouth daily at 6 PM. 04/18/17   Neva Seat, Tiffany, PA-C  buPROPion (WELLBUTRIN XL) 300 MG 24 hr tablet Take 300 mg by mouth at bedtime.     [provider]  diltiazem (CARDIZEM) 30 MG tablet  Take 1 tablet every 4 hours AS NEEDED for AFIB heart rate >100 12/25/17   Newman Niparroll, Donna C, NP  imipramine (TOFRANIL) 25 MG tablet Take 25 mg by mouth at bedtime.  01/14/16   [provider]  metoprolol tartrate (LOPRESSOR) 50 MG tablet Take 1 tablet (50 mg total) by mouth 2 (two) times daily. 08/07/18 11/05/18  Manson PasseyBhagat, Bhavinkumar, PA  Multiple Vitamins-Minerals (MULTIVITAMIN WITH MINERALS) tablet Take 1 tablet by mouth daily. 50 + Vitamin    [provider]  ranitidine (ZANTAC) 300 MG tablet Take 300 mg by mouth at bedtime.    [provider]     Allergies:    Allergies  Allergen Reactions  . Codeine  Nausea And Vomiting    Social History:   Social History   Socioeconomic History  . Marital status: Legally Separated    Spouse name: Not on file  . Number of children: 1  . Years of education: Not on file  . Highest education level: Not on file  Occupational History  . Not on file  Social Needs  . Financial resource strain: Not on file  . Food insecurity:    Worry: Not on file    Inability: Not on file  . Transportation needs:    Medical: Not on file    Non-medical: Not on file  Tobacco Use  . Smoking status: Former Smoker    Packs/day: 1.00    Years: 8.00    Pack years: 8.00    Types: Cigarettes    Last attempt to quit: 11/04/1990    Years since quitting: 27.8  . Smokeless tobacco: Never Used  Substance and Sexual Activity  . Alcohol use: No  . Drug use: No  . Sexual activity: Not Currently    Birth control/protection: Post-menopausal  Lifestyle  . Physical activity:    Days per week: Not on file    Minutes per session: Not on file  . Stress: Not on file  Relationships  . Social connections:    Talks on phone: Not on file    Gets together: Not on file    Attends religious service: Not on file    Active member of club or organization: Not on file    Attends meetings of clubs or organizations: Not on file    Relationship status: Not on file  . Intimate partner violence:    Fear of current or ex partner: Not on file    Emotionally abused: Not on file    Physically abused: Not on file    Forced sexual activity: Not on file  Other Topics Concern  . Not on file  Social History Narrative   [place] in [place] with sister.  Does not routinely exercise.  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    Family History:   The patient's family history includes Alcohol abuse in her father; Atrial fibrillation in her brother; Cardiomyopathy in her daughter; Congestive Heart Failure in her mother; Kidney failure in her mother; Lung cancer in her father; Sudden Cardiac Death in her daughter;  Wolff Parkinson White syndrome in her grandchild.    ROS:  Please see the history of present illness.  All other ROS reviewed and negative.     Physical Exam/Data:   Vitals:   09/15/18 1133  BP: (!) 145/81  Pulse: 75  Temp: 98.2 F (36.8 C)  TempSrc: Oral  SpO2: 99%  Weight: (!) 136.6 kg  Height: 5\' 7"  (1.702 m)   No intake or output data  in the 24 hours ending 09/15/18 1230 Filed Weights   09/15/18 1133  Weight: (!) 136.6 kg   Body mass index is 47.16 kg/m.  General:  Well nourished, well developed, in no acute distress HEENT: normal Lymph: no adenopathy Neck: no JVD Endocrine:  No thryomegaly Vascular: No carotid bruits Cardiac: RRR; no murmurs, gallops or rubs Lungs:  CTA b/l, no wheezing, rhonchi or rales  Abd: soft, nontender, no hepatomegaly  Ext: no edema Musculoskeletal:  No deformities Skin: warm and dry  Neuro:  no  gross focal abnormalities noted Psych:  Normal affect    EKG:  The ECG that was done today was personally reviewed and demonstrates  SR 76bpm, QTc  Relevant CV Studies:  04/17/17 TTE Study Conclusions - Left ventricle: The cavity size was normal. Systolic function was   normal. The estimated ejection fraction was in the range of 55%   to 60%. Wall motion was normal; there were no regional wall   motion abnormalities. - Aortic valve: There was trivial regurgitation. - Left atrium: The atrium was mildly dilated  Laboratory Data:  Chemistry Recent Labs  Lab 09/15/18 1026  NA 138  K 4.3  CL 105  CO2 24  GLUCOSE 84  BUN 11  CREATININE 1.09*  CALCIUM 9.2  GFRNONAA 56*  GFRAA >60  ANIONGAP 9    No results for input(s): PROT, ALBUMIN, AST, ALT, ALKPHOS, BILITOT in the last 168 hours. HematologyNo results for input(s): WBC, RBC, HGB, HCT, MCV, MCH, MCHC, RDW, PLT in the last 168 hours. Cardiac EnzymesNo results for input(s): TROPONINI in the last 168 hours. No results for input(s): TROPIPOC in the last 168 hours.  BNPNo  results for input(s): BNP, PROBNP in the last 168 hours.  DDimer No results for input(s): DDIMER in the last 168 hours.  Radiology/Studies:  No results found.  Assessment and Plan:   1. Paroxysmal Afib     CHA2DS2Vasc is 2, on eliquis     K+ 4.3     Mag 2.2     Creat 1.09 (Calc CrCl is 126)     QTc is OK  She arrives in [place]  2. OSA     CPAP while here  3. Anxiety     Continue home meds   For questions or updates, please contact [facility] Please consult [email or web address] for contact info under        Signed, Sheilah Pigeon, PA-C  09/15/2018 12:30 PM    I have seen, examined the patient, and reviewed the above assessment and plan.  Changes to above are made where necessary.  On exam, RRR.  Will plan to admit for initiation of tikosyn.  Pt reports compliance with eliquis.  Will follow QT closely.  Co Sign: Hillis Range, MD 09/15/2018 4:00 PM

## 2018-09-15 NOTE — Care Management (Signed)
#   9.  [employer] [employer]   @ [facility] #          [id number]  1. TIKOSYN  125 MCG  250 MCG   500 MCG BID COVER- : NONE FORMULARY PRIOR APPROVAL- YES # [id number]  2. DOFETILIDE  125 MCG  250 MCG  500 MCG BID COVER- YES CO-PAY- $ 10.00 FOR EACH PRESCRIPTION / 30 DAY SUPPLY TIER- NO PRIOR APPROVAL- NO  PREFERRED PHARMACY : YES [facility] M/O 90 DAY SUPPLY M/O $ 20.00

## 2018-09-15 NOTE — Progress Notes (Signed)
Primary Care Physician: Clovis Riley, L.August Saucer, MD Referring Physician: Dr. Eden Emms EP: Dr. Camnitz   Regina Morales is a 57 y.o. female with a h/o paroxysmal atrial fibrillation that has recently failed flecainide, off drug since Friday, and is in the afib clinic for admit for Tikosyn, referred by Dr. Elberta Fortis. She is in SR today.   Today, she denies symptoms of palpitations, chest pain, shortness of breath, orthopnea, PND, lower extremity edema, dizziness, presyncope, syncope, or neurologic sequela. The patient is tolerating medications without difficulties and is otherwise without complaint today.   Past Medical History:  Diagnosis Date  . Anemia    hx years ago  . Buzzing in ear    "right; even after OR"  . Chest pain    a. 2009: neg MV  ([facility])  . Depression   . DVT (deep vein thrombosis) in pregnancy 1980's   LLE  . Fibromyalgia    "dx'd many many years ago; I haven't had any problems w/it" (04/15/2017)  . GERD (gastroesophageal reflux disease)   . Hepatic steatosis   . High cholesterol   . History of blood transfusion    "low count after appendix surgery" unsure # of units transfused  . Hypertension   . Migraines    "maybe once/2 months" (04/15/2017)  . Obesity   . OSA on CPAP   . Persistent atrial fibrillation    a. Dx 07/2013;  b. CHA2DS2VASc=1 (female);  c. 07/2013 Echo:  EF 50-55%, no rwma, mod LVH.  . Pulmonary embolism (HCC) ~ 1984   "after I had my last child"  . SVD (spontaneous vaginal delivery)    x 4   Past Surgical History:  Procedure Laterality Date  . APPENDECTOMY  1990's  . COLONOSCOPY    . DILATATION & CURETTAGE/HYSTEROSCOPY WITH MYOSURE N/A 04/14/2017   Procedure: DILATATION & CURETTAGE/HYSTEROSCOPY WITH MYOSURE;  Surgeon: Myna Hidalgo, DO;  Location: [facility];  Service: Gynecology;  Laterality: N/A;  Polypectomy  . DILATION AND CURETTAGE OF UTERUS  1983   mab  . INNER EAR SURGERY Right 2000's  . LEFT HEART CATH AND CORONARY ANGIOGRAPHY N/A 04/17/2017     Procedure: Left Heart Cath and Coronary Angiography;  Surgeon: Corky Crafts, MD;  Location: [facility] INVASIVE CV LAB;  Service: Cardiovascular;  Laterality: N/A;  . PLANTAR FASCIA RELEASE Right 07/2015  . TUBAL LIGATION  1985    Current Outpatient Medications  Medication Sig Dispense Refill  . apixaban (ELIQUIS) 5 MG TABS tablet Take 1 tablet (5 mg total) by mouth 2 (two) times daily. 180 tablet 1  . atorvastatin (LIPITOR) 20 MG tablet Take 1 tablet (20 mg total) by mouth daily at 6 PM. 30 tablet 9  . buPROPion (WELLBUTRIN XL) 300 MG 24 hr tablet Take 300 mg by mouth at bedtime.     Marland Kitchen diltiazem (CARDIZEM) 30 MG tablet Take 1 tablet every 4 hours AS NEEDED for AFIB heart rate >100 45 tablet 1  . imipramine (TOFRANIL) 25 MG tablet Take 25 mg by mouth at bedtime.     . metoprolol tartrate (LOPRESSOR) 50 MG tablet Take 1 tablet (50 mg total) by mouth 2 (two) times daily. 180 tablet 3  . Multiple Vitamins-Minerals (MULTIVITAMIN WITH MINERALS) tablet Take 1 tablet by mouth daily. 50 + Vitamin    . ranitidine (ZANTAC) 300 MG tablet Take 300 mg by mouth at bedtime.     No current facility-administered medications for this encounter.     Allergies  Allergen Reactions  .  Codeine Nausea And Vomiting    Social History   Socioeconomic History  . Marital status: Legally Separated    Spouse name: Not on file  . Number of children: 1  . Years of education: Not on file  . Highest education level: Not on file  Occupational History  . Not on file  Social Needs  . Financial resource strain: Not on file  . Food insecurity:    Worry: Not on file    Inability: Not on file  . Transportation needs:    Medical: Not on file    Non-medical: Not on file  Tobacco Use  . Smoking status: Former Smoker    Packs/day: 1.00    Years: 8.00    Pack years: 8.00    Types: Cigarettes    Last attempt to quit: 11/04/1990    Years since quitting: 27.8  . Smokeless tobacco: Never Used  Substance and Sexual  Activity  . Alcohol use: No  . Drug use: No  . Sexual activity: Not Currently    Birth control/protection: Post-menopausal  Lifestyle  . Physical activity:    Days per week: Not on file    Minutes per session: Not on file  . Stress: Not on file  Relationships  . Social connections:    Talks on phone: Not on file    Gets together: Not on file    Attends religious service: Not on file    Active member of club or organization: Not on file    Attends meetings of clubs or organizations: Not on file    Relationship status: Not on file  . Intimate partner violence:    Fear of current or ex partner: Not on file    Emotionally abused: Not on file    Physically abused: Not on file    Forced sexual activity: Not on file  Other Topics Concern  . Not on file  Social History Narrative   Lives in [place] with sister.  Does not routinely exercise.  Works in Teacher, early years/pre for [employer].    Family History  Problem Relation Age of Onset  . Lung cancer Father   . Alcohol abuse Father   . Kidney failure Mother        s/p renal Tx  . Congestive Heart Failure Mother   . Atrial fibrillation Brother   . Sudden Cardiac Death Daughter        cardiac arrest  . Cardiomyopathy Daughter   . Evelene Croon Parkinson White syndrome Grandchild         granddaughter    ROS- All systems are reviewed and negative except as per the HPI above  Physical Exam: Vitals:   09/15/18 1030  BP: 128/84  Pulse: 76  Weight: (!) 137.9 kg  Height: 5\' 7"  (1.702 m)   Wt Readings from Last 3 Encounters:  09/15/18 (!) 137.9 kg  08/27/18 (!) 139.3 kg  08/17/18 (!) 138.3 kg    Labs: Lab Results  Component Value Date   NA 139 08/07/2018   K 4.2 08/07/2018   CL 100 08/07/2018   CO2 22 08/07/2018   GLUCOSE 92 08/07/2018   BUN 14 08/07/2018   CREATININE 0.93 08/07/2018   CALCIUM 9.7 08/07/2018   MG 2.0 04/15/2017   Lab Results  Component Value Date   INR 1.11 04/15/2017   Lab Results  Component Value Date   CHOL 186  04/23/2014   HDL 53 04/23/2014   LDLCALC 109 (H) 04/23/2014   TRIG 122 04/23/2014  GEN- The patient is well appearing, alert and oriented x 3 today.   Head- normocephalic, atraumatic Eyes-  Sclera clear, conjunctiva pink Ears- hearing intact Oropharynx- clear Neck- supple, no JVP Lymph- no cervical lymphadenopathy Lungs- Clear to ausculation bilaterally, normal work of breathing Heart- Regular rate and rhythm, no murmurs, rubs or gallops, PMI not laterally displaced GI- soft, NT, ND, + BS Extremities- no clubbing, cyanosis, or edema MS- no significant deformity or atrophy Skin- no rash or lesion Psych- euthymic mood, full affect Neuro- strength and sensation are intact  EKG-NSR at 76 bpm , pr int 168 ms, qrs int 98 ms, qtc 436 ms Epic records reviewed Echo-04/17/17-Study Conclusions  - Left ventricle: The cavity size was normal. Systolic function was   normal. The estimated ejection fraction was in the range of 55%   to 60%. Wall motion was normal; there were no regional wall   motion abnormalities. - Aortic valve: There was trivial regurgitation. - Left atrium: The atrium was mildly dilated.     Assessment and Plan: 1. Paroxysmal afib Recent increase in afib burden,despite flecainide use, now off flecainide x 3 days and is here for Tikosyn admit Can afford drug, will cost $20 a month Drugs screened by PharmD and not on any qtc prolonging drugs Qtc is acceptable at 436 ms No benadryl use No missed doses of eliquis 5 mg bid Bmet/mag today  To 6E 27  Donna C. [facility] [address] [place], [place] [address] [phone]

## 2018-09-15 NOTE — Progress Notes (Signed)
Pharmacy Review for Dofetilide (Tikosyn) Initiation  Admit Complaint: 57 y.o. female admitted 09/15/2018 with atrial fibrillation to be initiated on dofetilide.   Assessment:  Patient Exclusion Criteria: If any screening criteria checked as "Yes", then  patient  should NOT receive dofetilide until criteria item is corrected. If "Yes" please indicate correction plan.  YES  NO Patient  Exclusion Criteria Correction Plan  []  [x]  Baseline QTc interval is greater than or equal to 440 msec. IF above YES box checked dofetilide contraindicated unless patient has ICD; then may proceed if QTc 500-550 msec or with known ventricular conduction abnormalities may proceed with QTc 550-600 msec. QTc =  436   []  [x]  Magnesium level is less than 1.8 mEq/l : Last magnesium:  Lab Results  Component Value Date   MG 2.2 09/15/2018         []  [x]  Potassium level is less than 4 mEq/l : Last potassium:  Lab Results  Component Value Date   K 4.3 09/15/2018         []  [x]  Patient is known or suspected to have a digoxin level greater than 2 ng/ml: No results found for: DIGOXIN    []  [x]  Creatinine clearance less than 20 ml/min (calculated using Cockcroft-Gault, actual body weight and serum creatinine): Estimated Creatinine Clearance: 84.3 mL/min (A) (by C-G formula based on SCr of 1.09 mg/dL (H)).    []  [x]  Patient has received drugs known to prolong the QT intervals within the last 48 hours (phenothiazines, tricyclics or tetracyclic antidepressants, erythromycin, H-1 antihistamines, cisapride, fluoroquinolones, azithromycin). Drugs not listed above may have an, as yet, undetected potential to prolong the QT interval, updated information on QT prolonging agents is available at this website:QT prolonging agents   []  [x]  Patient received a dose of hydrochlorothiazide (Oretic) alone or in any combination including triamterene (Dyazide, Maxzide) in the last 48 hours.   []  [x]  Patient received a medication known  to increase dofetilide plasma concentrations prior to initial dofetilide dose:  . Trimethoprim (Primsol, Proloprim) in the last 36 hours . Verapamil (Calan, Verelan) in the last 36 hours or a sustained release dose in the last 72 hours . Megestrol (Megace) in the last 5 days  . Cimetidine (Tagamet) in the last 6 hours . Ketoconazole (Nizoral) in the last 24 hours . Itraconazole (Sporanox) in the last 48 hours  . Prochlorperazine (Compazine) in the last 36 hours    []  [x]  Patient is known to have a history of torsades de pointes; congenital or acquired long QT syndromes.   []  [x]  Patient has received a Class 1 antiarrhythmic with less than 2 half-lives since last dose. (Disopyramide, Quinidine, Procainamide, Lidocaine, Mexiletine, Flecainide, Propafenone)   []  [x]  Patient has received amiodarone therapy in the past 3 months or amiodarone level is greater than 0.3 ng/ml.    Patient has been appropriately anticoagulated with Eliquis.  Ordering provider was confirmed at [email or web address] if they are not listed on the [facility] Authorized Prescribers list.  Goal of Therapy: Follow renal function, electrolytes, potential drug interactions, and dose adjustment. Provide education and 1 week supply at discharge.  Plan:  [x]   Physician selected initial dose within range recommended for patients level of renal function - will monitor for response.  []   Physician selected initial dose outside of range recommended for patients level of renal function - will discuss if the dose should be altered at this time.   Select One Calculated CrCl  Dose q12h  [x]  > 60 ml/min 500  mcg  []  40-60 ml/min 250 mcg  []  20-40 ml/min 125 mcg   2. Follow up QTc after the first 5 doses, renal function, electrolytes (K & Mg) daily x 3     days, dose adjustment, success of initiation and facilitate 1 week discharge supply as     clinically indicated.  3. Initiate Tikosyn education video (Call [phone] and ask for Tikosyn  Video # 116).  4. Place Enrollment Form on the chart for discharge supply of dofetilide.   Armandina StammerBATCHELDER,Shanette Tamargo J 12:53 PM 09/15/2018

## 2018-09-16 ENCOUNTER — Encounter (HOSPITAL_COMMUNITY): Payer: Self-pay

## 2018-09-16 DIAGNOSIS — I480 Paroxysmal atrial fibrillation: Secondary | ICD-10-CM

## 2018-09-16 DIAGNOSIS — Z79899 Other long term (current) drug therapy: Secondary | ICD-10-CM

## 2018-09-16 DIAGNOSIS — Z5181 Encounter for therapeutic drug level monitoring: Secondary | ICD-10-CM

## 2018-09-16 LAB — MAGNESIUM: MAGNESIUM: 2.2 mg/dL (ref 1.7–2.4)

## 2018-09-16 LAB — BASIC METABOLIC PANEL
ANION GAP: 6 (ref 5–15)
BUN: 13 mg/dL (ref 6–20)
CHLORIDE: 105 mmol/L (ref 98–111)
CO2: 26 mmol/L (ref 22–32)
Calcium: 9 mg/dL (ref 8.9–10.3)
Creatinine, Ser: 0.92 mg/dL (ref 0.44–1.00)
GFR calc Af Amer: >60 mL/min (ref >60)
GFR calc non Af Amer: >60 mL/min (ref >60)
GLUCOSE: 127 mg/dL — AB (ref 70–99)
Potassium: 4 mmol/L (ref 3.5–5.1)
SODIUM: 137 mmol/L (ref 135–145)

## 2018-09-16 MED ORDER — ACETAMINOPHEN 325 MG PO TABS
650.0000 mg | ORAL_TABLET | Freq: Four times a day (QID) | ORAL | Status: DC | PRN
  Administered 2018-09-16 – 2018-09-17 (×2): 650 mg via ORAL
  Filled 2018-09-16 (×2): qty 2

## 2018-09-16 NOTE — Progress Notes (Signed)
Progress Note  Patient Name: Regina Morales Date of Encounter: 09/16/2018  Primary Cardiologist: [name], MD   Subjective   Feeling well.  No complaint today.  Inpatient Medications    Scheduled Meds: . apixaban  5 mg Oral BID  . atorvastatin  20 mg Oral q1800  . buPROPion  300 mg Oral QHS  . dofetilide  500 mcg Oral BID  . famotidine  40 mg Oral QHS  . imipramine  25 mg Oral QHS  . Influenza vac split quadrivalent PF  0.5 mL Intramuscular Tomorrow-1000  . metoprolol tartrate  50 mg Oral BID  . multivitamin with minerals  1 tablet Oral Daily  . sodium chloride flush  3 mL Intravenous Q12H   Continuous Infusions: . sodium chloride     PRN Meds: sodium chloride, sodium chloride flush   Vital Signs    Vitals:   09/15/18 1133 09/15/18 2053 09/16/18 0025 09/16/18 0602  BP: (!) 145/81 112/61  107/78  Pulse: 75 71  61  Resp:  18 19 18   Temp: 98.2 F (36.8 C) 98.1 F (36.7 C)  98 F (36.7 C)  TempSrc: Oral Oral  Oral  SpO2: 99% 98% 98% 99%  Weight: (!) 136.6 kg   (!) 137.9 kg  Height: 5\' 7"  (1.702 m)       Intake/Output Summary (Last 24 hours) at 09/16/2018 0759 Last data filed at 09/15/2018 1437 Gross per 24 hour  Intake 3 ml  Output -  Net 3 ml   Filed Weights   09/15/18 1133 09/16/18 0602  Weight: (!) 136.6 kg (!) 137.9 kg    Telemetry    Sinus rhythm- Personally Reviewed  ECG    Sinus rhythm- Personally Reviewed  Physical Exam   GEN: No acute distress.   Neck: No JVD Cardiac: RRR, no murmurs, rubs, or gallops.  Respiratory: Clear to auscultation bilaterally. GI: Soft, nontender, non-distended  MS: No edema; No deformity. Neuro:  Nonfocal  Psych: Normal affect   Labs    Chemistry Recent Labs  Lab 09/15/18 1026 09/16/18 0412  NA 138 137  K 4.3 4.0  CL 105 105  CO2 24 26  GLUCOSE 84 127*  BUN 11 13  CREATININE 1.09* 0.92  CALCIUM 9.2 9.0  GFRNONAA 56* >60  GFRAA >60 >60  ANIONGAP 9 6     HematologyNo results for  input(s): WBC, RBC, HGB, HCT, MCV, MCH, MCHC, RDW, PLT in the last 168 hours.  Cardiac EnzymesNo results for input(s): TROPONINI in the last 168 hours. No results for input(s): TROPIPOC in the last 168 hours.   BNPNo results for input(s): BNP, PROBNP in the last 168 hours.   DDimer No results for input(s): DDIMER in the last 168 hours.   Radiology    No results found.  Cardiac Studies   TTE 04/17/18 - Left ventricle: The cavity size was normal. Systolic function was   normal. The estimated ejection fraction was in the range of 55%   to 60%. Wall motion was normal; there were no regional wall   motion abnormalities. - Aortic valve: There was trivial regurgitation. - Left atrium: The atrium was mildly dilated.  Patient Profile     57 y.o. female with a history of fibromyalgia, hyperlipidemia, OSA on CPAP, who presents for initiation of dofetilide for atrial fibrillation.  Assessment & Plan    1.  Paroxysmal atrial fibrillation: Currently on Eliquis.  First dose of dofetilide yesterday.   keep potassium greater than 4, magnesium  greater than 2.  QTC acceptable after first dose.  This patients CHA2DS2-VASc Score and unadjusted Ischemic Stroke Rate (% per year) is equal to 2.2 % stroke rate/year from a score of 2  Above score calculated as 1 point each if present [CHF, HTN, DM, Vascular=MI/PAD/Aortic Plaque, Age if 65-74, or Female] Above score calculated as 2 points each if present [Age > 75, or Stroke/TIA/TE]  2.  OSA: Continue CPAP  3.  Anxiety: Continue home meds  For questions or updates, please contact [facility] Please consult [email or web address] for contact info under        Signed,  Jorja LoaMartin , MD  09/16/2018, 7:59 AM

## 2018-09-16 NOTE — Progress Notes (Signed)
Set pat up on CPAP full face mask due to patient states that's what she wears at home.  Tolerating well.

## 2018-09-16 NOTE — Discharge Instructions (Addendum)
You have an appointment set up with the Atrial Fibrillation Clinic.  Multiple studies have shown that being followed by a dedicated atrial fibrillation clinic in addition to the standard care you receive from your other physicians improves health. We believe that enrollment in the atrial fibrillation clinic will allow us to better care for you.  ° °The phone number to the Atrial Fibrillation Clinic is [phone]. The clinic is staffed Monday through Friday from 8:30am to 5pm. ° °Parking Directions: The clinic is located in the Heart and Vascular Building connected to [facility]. °1)From [address] turn on to [address] and go to the 3rd entrance  (Heart and Vascular entrance) on the right. °2)Look to the right for Heart &Vascular Parking Garage. °3)A code for the entrance is required please call the clinic to receive this.   °4)Take the elevators to the 1st floor. Registration is in the room with the glass walls at the end of the hallway. ° °If you have any trouble parking or locating the clinic, please don’t hesitate to call [phone]. °Information on my medicine - ELIQUIS® (apixaban) ° °Why was Eliquis® prescribed for you? °Eliquis® was prescribed for you to reduce the risk of a blood clot forming that can cause a stroke if you have a medical condition called atrial fibrillation (a type of irregular heartbeat). ° °What do You need to know about Eliquis® ? °Take your Eliquis® TWICE DAILY - one tablet in the morning and one tablet in the evening with or without food. If you have difficulty swallowing the tablet whole please discuss with your pharmacist how to take the medication safely. ° °Take Eliquis® exactly as prescribed by your doctor and DO NOT stop taking Eliquis® without talking to the doctor who prescribed the medication.  Stopping may increase your risk of developing a stroke.  Refill your prescription before you run out. ° °After discharge, you should have regular check-up  appointments with your healthcare provider that is prescribing your Eliquis®.  In the future your dose may need to be changed if your kidney function or weight changes by a significant amount or as you get older. ° °What do you do if you miss a dose? °If you miss a dose, take it as soon as you remember on the same day and resume taking twice daily.  Do not take more than one dose of ELIQUIS at the same time to make up a missed dose. ° °Important Safety Information °A possible side effect of Eliquis® is bleeding. You should call your healthcare provider right away if you experience any of the following: °? Bleeding from an injury or your nose that does not stop. °? Unusual colored urine (red or dark brown) or unusual colored stools (red or black). °? Unusual bruising for unknown reasons. °? A serious fall or if you hit your head (even if there is no bleeding). ° °Some medicines may interact with Eliquis® and might increase your risk of bleeding or clotting while on Eliquis®. To help avoid this, consult your healthcare provider or pharmacist prior to using any new prescription or non-prescription medications, including herbals, vitamins, non-steroidal anti-inflammatory drugs (NSAIDs) and supplements. ° °This website has more information on Eliquis® (apixaban): http://www.eliquis.com/eliquis/home ° °

## 2018-09-17 ENCOUNTER — Encounter (HOSPITAL_COMMUNITY)
Admission: AD | Disposition: A | Discharge status: Home / Self Care | Payer: Self-pay | Source: Ambulatory Visit | Attending: Cardiology

## 2018-09-17 LAB — BASIC METABOLIC PANEL
Anion gap: 8 (ref 5–15)
BUN: 11 mg/dL (ref 6–20)
CHLORIDE: 104 mmol/L (ref 98–111)
CO2: 26 mmol/L (ref 22–32)
Calcium: 8.9 mg/dL (ref 8.9–10.3)
Creatinine, Ser: 0.9 mg/dL (ref 0.44–1.00)
GFR calc non Af Amer: >60 mL/min (ref >60)
Glucose, Bld: 112 mg/dL — ABNORMAL HIGH (ref 70–99)
POTASSIUM: 4.2 mmol/L (ref 3.5–5.1)
Sodium: 138 mmol/L (ref 135–145)

## 2018-09-17 LAB — MAGNESIUM: Magnesium: 2.2 mg/dL (ref 1.7–2.4)

## 2018-09-17 SURGERY — CARDIOVERSION
Anesthesia: General

## 2018-09-17 NOTE — Progress Notes (Addendum)
Progress Note  Patient Name: Regina Morales Date of Encounter: 09/17/2018  Primary Cardiologist: [name], MD   Subjective   Feeling well.  No complaints again today today.  Inpatient Medications    Scheduled Meds: . apixaban  5 mg Oral BID  . atorvastatin  20 mg Oral q1800  . buPROPion  300 mg Oral QHS  . dofetilide  500 mcg Oral BID  . famotidine  40 mg Oral QHS  . imipramine  25 mg Oral QHS  . Influenza vac split quadrivalent PF  0.5 mL Intramuscular Tomorrow-1000  . metoprolol tartrate  50 mg Oral BID  . multivitamin with minerals  1 tablet Oral Daily  . sodium chloride flush  3 mL Intravenous Q12H   Continuous Infusions: . sodium chloride     PRN Meds: sodium chloride, acetaminophen, sodium chloride flush   Vital Signs    Vitals:   09/16/18 1551 09/16/18 2109 09/16/18 2143 09/17/18 0639  BP: 112/63 116/60 111/60 136/82  Pulse: 63 68 62 62  Resp:   18 18  Temp: 97.9 F (36.6 C)  97.9 F (36.6 C) 98.2 F (36.8 C)  TempSrc: Oral  Oral Oral  SpO2: 99%  96% 100%  Weight:    (!) 137.8 kg  Height:        Intake/Output Summary (Last 24 hours) at 09/17/2018 1610 Last data filed at 09/16/2018 1850 Gross per 24 hour  Intake 582 ml  Output -  Net 582 ml   Filed Weights   09/15/18 1133 09/16/18 0602 09/17/18 0639  Weight: (!) 136.6 kg (!) 137.9 kg (!) 137.8 kg    Telemetry    Sinus rhythm- Personally Reviewed  ECG    Sinus rhythm, QTc is OK, - Personally Reviewed with dr. Elberta Fortis  Physical Exam   GEN: No acute distress.   Neck: No JVD Cardiac: RRR, no murmurs, rubs, or gallops.  Respiratory:CTA b.l. GI: Soft, nontender, non-distended, obese MS: No edema; No deformity. Neuro:  Nonfocal  Psych: Normal affect   Labs    Chemistry Recent Labs  Lab 09/15/18 1026 09/16/18 0412 09/17/18 0426  NA 138 137 138  K 4.3 4.0 4.2  CL 105 105 104  CO2 24 26 26   GLUCOSE 84 127* 112*  BUN 11 13 11   CREATININE 1.09* 0.92 0.90  CALCIUM 9.2  9.0 8.9  GFRNONAA 56* >60 >60  GFRAA >60 >60 >60  ANIONGAP 9 6 8      HematologyNo results for input(s): WBC, RBC, HGB, HCT, MCV, MCH, MCHC, RDW, PLT in the last 168 hours.  Cardiac EnzymesNo results for input(s): TROPONINI in the last 168 hours. No results for input(s): TROPIPOC in the last 168 hours.   BNPNo results for input(s): BNP, PROBNP in the last 168 hours.   DDimer No results for input(s): DDIMER in the last 168 hours.   Radiology    No results found.  Cardiac Studies   TTE 04/17/18 - Left ventricle: The cavity size was normal. Systolic function was   normal. The estimated ejection fraction was in the range of 55%   to 60%. Wall motion was normal; there were no regional wall   motion abnormalities. - Aortic valve: There was trivial regurgitation. - Left atrium: The atrium was mildly dilated.  Patient Profile     57 y.o. female with a history of fibromyalgia, hyperlipidemia, OSA on CPAP, who presents for initiation of dofetilide for atrial fibrillation.  Assessment & Plan    1.  Paroxysmal  atrial fibrillation:       CHA2DS2Vasc is 2, on eliquis, appropriately dosed      K+ 4.2      Mag 2.2      Creat 0.90, stable      QT looks good  Anticipate discharge tomorrow       2.  OSA: Continue CPAP  3.  Anxiety: Continue home meds  4. Morbid obesity: diet and exercise encouraged.  For questions or updates, please contact [facility] Please consult [email or web address] for contact info under        Signed, Sheilah PigeonRenee Lynn Ursuy, PA-C  09/17/2018, 9:37 AM    I have seen and examined this patient with Regina Dowseenee Morales.  Agree with above, note added to reflect my findings.  On exam, RRR, no murmurs, lungs clear.  Patient admitted to the hospital for dofetilide loading.  QTC remains stable.  Continue current dose.  Regina Morales M. Erial Fikes MD 09/17/2018 10:28 AM

## 2018-09-17 NOTE — Care Management Note (Signed)
Case Management Note  Patient Details  Name: Celese A SwazilandJordan MRN: [id number] Date of Birth: 11/02/1961  Subjective/Objective:  Pt presented for Tikosyn Load. Plan for home once stable. Benefits Check completed for Tikosyn.                   Action/Plan: Pt will need a Rx for 14 day supply Tikosyn to be filled [facility]. Pt will need an additional Rx for 90 day supply via [facility]. No further needs from CM at this time.   Expected Discharge Date:                  Expected Discharge Plan:  Home/Self Care  In-House Referral:  NA  Discharge planning Services  CM Consult, Medication Assistance  Post Acute Care Choice:  NA Choice offered to:  NA  DME Arranged:  N/A DME Agency:  NA  HH Arranged:  NA HH Agency:  NA  Status of Service:  Completed, signed off  If discussed at Long Length of Stay Meetings, dates discussed:    Additional Comments:  Gala LewandowskyGraves-Bigelow, [place] Denison Kaye, RN 09/17/2018, 1:04 PM

## 2018-09-18 LAB — BASIC METABOLIC PANEL
ANION GAP: 8 (ref 5–15)
BUN: 8 mg/dL (ref 6–20)
CO2: 26 mmol/L (ref 22–32)
Calcium: 8.9 mg/dL (ref 8.9–10.3)
Chloride: 105 mmol/L (ref 98–111)
Creatinine, Ser: 0.95 mg/dL (ref 0.44–1.00)
GFR calc Af Amer: >60 mL/min (ref >60)
Glucose, Bld: 95 mg/dL (ref 70–99)
POTASSIUM: 4.4 mmol/L (ref 3.5–5.1)
SODIUM: 139 mmol/L (ref 135–145)

## 2018-09-18 LAB — MAGNESIUM: MAGNESIUM: 2.3 mg/dL (ref 1.7–2.4)

## 2018-09-18 MED ORDER — DOFETILIDE 500 MCG PO CAPS: 500.0000 ug | ORAL_CAPSULE | Freq: Two times a day (BID) | ORAL | 0 refills | Status: DC

## 2018-09-18 MED ORDER — DOFETILIDE 500 MCG PO CAPS: 500.0000 ug | ORAL_CAPSULE | Freq: Two times a day (BID) | ORAL | 2 refills | Status: DC

## 2018-09-18 MED ORDER — DOFETILIDE 500 MCG PO CAPS: 500.0000 ug | ORAL_CAPSULE | Freq: Two times a day (BID) | ORAL | 6 refills | Status: DC

## 2018-09-18 MED FILL — TIKOSYN 500 MCG CAPS: 500 | 14 days supply | Qty: 28 | Fill #0

## 2018-09-18 NOTE — Discharge Summary (Addendum)
ELECTROPHYSIOLOGY PROCEDURE DISCHARGE SUMMARY    Patient ID: Regina Morales,  MRN: [id number], DOB/AGE: Mar 02, 1961 57 y.o.  Admit date: 09/15/2018 Discharge date: 09/18/2018  Primary Care Physician: Regina Morales, L.Regina Saucer, MD  Primary Cardiologist: Dr. Eden Morales Electrophysiologist: Dr. Novella Morales  Primary Discharge Diagnosis:  1.  Paroxysmal AFib status post Tikosyn loading this admission  Secondary Discharge Diagnosis:  1. Figbomyalgia 2. Obesity 3. OSA w/CPAP  Allergies  Allergen Reactions  . Codeine Nausea And Vomiting     Procedures This Admission:  1.  Tikosyn loading    Brief HPI: Regina Morales is a 57 y.o. female with a past medical history as noted above.  They is followed by EP in the outpatient setting for atrial fibrillation.  Risks, benefits, and alternatives to Tikosyn were reviewed with the patient who wished to proceed.    Hospital Course:  The patient was admitted and Tikosyn was initiated.  Renal function and electrolytes were followed during the hospitalization.  Her QTc remained stable.  She converted to SR with drug and did not required DCCV, maintaining SR through her stay.  On the day of discharge, she feels well, no CP or SOB, she was examined by Dr  Regina Morales who considered them stable for discharge to [facility].  Follow-up has been arranged with the AFib clinic in 1 week and with Dr Regina Morales.   Tikosyn teaching completed, pt Regina Morales with 2 week supply of medicine given mail away regular RX  Physical Exam: Vitals:   09/17/18 0639 09/17/18 1251 09/17/18 2122 09/18/18 0429  BP: 136/82 122/79 (!) 114/41 115/63  Pulse: 62 63 76 (!) 58  Resp: 18     Temp: 98.2 F (36.8 C) 98.4 F (36.9 C) 97.7 F (36.5 C) 97.9 F (36.6 C)  TempSrc: Oral Oral Oral Oral  SpO2: 100% 100% 99% 99%  Weight: (!) 137.8 kg   (!) 137.8 kg  Height:        GEN- The patient is well appearing, alert and oriented x 3 today.   HEENT: normocephalic, atraumatic;  sclera clear, conjunctiva pink; hearing intact; oropharynx clear; neck supple, no JVP Lymph- no cervical lymphadenopathy Lungs- CTA b/l, normal work of breathing.  No wheezes, rales, rhonchi Heart- RRR, no murmurs, rubs or gallops, PMI not laterally displaced GI- soft, non-tender, non-distended Extremities- no clubbing, cyanosis, or edema MS- no significant deformity or atrophy Skin- warm and dry, no rash or lesion Psych- euthymic mood, full affect Neuro- strength and sensation are intact   Labs:   Lab Results  Component Value Date   WBC 7.9 08/07/2018   HGB 12.6 08/07/2018   HCT 36.6 08/07/2018   MCV 86 08/07/2018   PLT 335 08/07/2018    Recent Labs  Lab 09/18/18 0426  NA 139  K 4.4  CL 105  CO2 26  BUN 8  CREATININE 0.95  CALCIUM 8.9  GLUCOSE 95     Discharge Medications:  Allergies as of 09/18/2018      Reactions   Codeine Nausea And Vomiting      Medication List    TAKE these medications   apixaban 5 MG Tabs tablet Commonly known as:  ELIQUIS Take 1 tablet (5 mg total) by mouth 2 (two) times daily.   atorvastatin 20 MG tablet Commonly known as:  LIPITOR Take 1 tablet (20 mg total) by mouth daily at 6 PM.   buPROPion 300 MG 24 hr tablet Commonly known as:  WELLBUTRIN XL Take 300  mg by mouth at bedtime.   diltiazem 30 MG tablet Commonly known as:  CARDIZEM Take 1 tablet every 4 hours AS NEEDED for AFIB heart rate >100   dofetilide 500 MCG capsule Commonly known as:  TIKOSYN Take 1 capsule (500 mcg total) by mouth 2 (two) times daily.   imipramine 25 MG tablet Commonly known as:  TOFRANIL Take 25 mg by mouth at bedtime.   metoprolol tartrate 50 MG tablet Commonly known as:  LOPRESSOR Take 1 tablet (50 mg total) by mouth 2 (two) times daily.   multivitamin with minerals tablet Take 1 tablet by mouth daily. 50 + Vitamin   ranitidine 300 MG tablet Commonly known as:  ZANTAC Take 300 mg by mouth at bedtime.       Disposition:   [facility]  Discharge Instructions    Diet - low sodium heart healthy   Complete by:  As directed    Increase activity slowly   Complete by:  As directed      Follow-up Information    [facility] Follow up.   Specialty:  Cardiology Why:  09/25/18 @ 8:30AM Contact information: [address] [id number] [place] [address]  [id number] [phone]       Regina Morales, Regina Stith Martin, MD Follow up.   Specialty:  Cardiology Why:  10/16/18 @ 4:00PM Contact information: [address] [place] [address] [phone]           Duration of Discharge Encounter: Greater than 30 minutes including physician time.  Regina FredricksonSigned, Regina Ursuy, PA-C 09/18/2018 12:27 PM    I have seen and examined this patient with Regina Dowseenee Morales.  Agree with above, note added to reflect my findings.  On exam, RRR, no murmurs, lungs clear. Admitted to the hospital for tikosyn loading. QTc remained stable. Plan for discharge today with follow up in AF clinic.    Regina Milligan M. Melton Walls MD 09/18/2018 1:04 PM

## 2018-09-24 ENCOUNTER — Other Ambulatory Visit: Payer: Self-pay | Admitting: Cardiovascular Disease

## 2018-09-24 NOTE — Telephone Encounter (Signed)
Pt last saw Dr Elberta Fortisamnitz on 08/27/18, last labs 09/18/18 Creat 0.95, age 57, weight 137.9kg, based on specified criteria pt is on appropriate dosage of Eliquis 5mg  BID.  Will refill rx.

## 2018-09-25 ENCOUNTER — Ambulatory Visit (HOSPITAL_COMMUNITY)
Admission: RE | Admit: 2018-09-25 | Discharge: 2018-09-25 | Disposition: A | Discharge status: Home / Self Care | Payer: Managed Care, Other (non HMO) | Source: Ambulatory Visit | Attending: Nurse Practitioner | Admitting: Nurse Practitioner

## 2018-09-25 ENCOUNTER — Encounter (HOSPITAL_COMMUNITY): Payer: Self-pay | Admitting: Nurse Practitioner

## 2018-09-25 VITALS — BP 118/74 | HR 69 | Ht 67.0 in | Wt 305.0 lb

## 2018-09-25 DIAGNOSIS — I10 Essential (primary) hypertension: Secondary | ICD-10-CM | POA: Insufficient documentation

## 2018-09-25 DIAGNOSIS — I480 Paroxysmal atrial fibrillation: Secondary | ICD-10-CM | POA: Insufficient documentation

## 2018-09-25 DIAGNOSIS — E7800 Pure hypercholesterolemia, unspecified: Secondary | ICD-10-CM | POA: Insufficient documentation

## 2018-09-25 DIAGNOSIS — M797 Fibromyalgia: Secondary | ICD-10-CM | POA: Insufficient documentation

## 2018-09-25 DIAGNOSIS — Z7901 Long term (current) use of anticoagulants: Secondary | ICD-10-CM | POA: Insufficient documentation

## 2018-09-25 DIAGNOSIS — Z87891 Personal history of nicotine dependence: Secondary | ICD-10-CM | POA: Insufficient documentation

## 2018-09-25 DIAGNOSIS — K219 Gastro-esophageal reflux disease without esophagitis: Secondary | ICD-10-CM | POA: Insufficient documentation

## 2018-09-25 DIAGNOSIS — G4733 Obstructive sleep apnea (adult) (pediatric): Secondary | ICD-10-CM | POA: Diagnosis not present

## 2018-09-25 DIAGNOSIS — Z86711 Personal history of pulmonary embolism: Secondary | ICD-10-CM | POA: Diagnosis not present

## 2018-09-25 NOTE — Progress Notes (Signed)
Primary Care Physician: Clovis Riley, L.August Saucer, MD Referring Physician: Dr. Eden Emms EP: Dr. Camnitz   Regina Morales is a 57 y.o. female with a h/o paroxysmal atrial fibrillation that has recently failed flecainide, off drug since Friday, and is in the afib clinic for admit for Tikosyn, referred by Dr. Elberta Fortis. She is in SR today.   F/u in afib clinic one week after tikosyn load. She in in SR with stable QTc.  Today, she denies symptoms of palpitations, chest pain, shortness of breath, orthopnea, PND, lower extremity edema, dizziness, presyncope, syncope, or neurologic sequela. The patient is tolerating medications without difficulties and is otherwise without complaint today.   Past Medical History:  Diagnosis Date  . Anemia    hx years ago  . Buzzing in ear    "right; even after OR"  . Chest pain    a. 2009: neg MV  ([facility])  . Depression   . DVT (deep vein thrombosis) in pregnancy 1980's   LLE  . Fibromyalgia    "dx'd many many years ago; I haven't had any problems w/it" (04/15/2017)  . GERD (gastroesophageal reflux disease)   . Hepatic steatosis   . High cholesterol   . History of blood transfusion    "low count after appendix surgery" unsure # of units transfused  . Hypertension   . Migraines    "maybe once/2 months" (04/15/2017)  . Obesity   . OSA on CPAP   . Persistent atrial fibrillation    a. Dx 07/2013;  b. CHA2DS2VASc=1 (female);  c. 07/2013 Echo:  EF 50-55%, no rwma, mod LVH.  . Pulmonary embolism (HCC) ~ 1984   "after I had my last child"  . SVD (spontaneous vaginal delivery)    x 4   Past Surgical History:  Procedure Laterality Date  . APPENDECTOMY  1990's  . COLONOSCOPY    . DILATATION & CURETTAGE/HYSTEROSCOPY WITH MYOSURE N/A 04/14/2017   Procedure: DILATATION & CURETTAGE/HYSTEROSCOPY WITH MYOSURE;  Surgeon: Myna Hidalgo, DO;  Location: [facility];  Service: Gynecology;  Laterality: N/A;  Polypectomy  . DILATION AND CURETTAGE OF UTERUS  1983   mab  . INNER EAR  SURGERY Right 2000's  . LEFT HEART CATH AND CORONARY ANGIOGRAPHY N/A 04/17/2017   Procedure: Left Heart Cath and Coronary Angiography;  Surgeon: Corky Crafts, MD;  Location: [facility] INVASIVE CV LAB;  Service: Cardiovascular;  Laterality: N/A;  . PLANTAR FASCIA RELEASE Right 07/2015  . TUBAL LIGATION  1985    Current Outpatient Medications  Medication Sig Dispense Refill  . atorvastatin (LIPITOR) 20 MG tablet Take 1 tablet (20 mg total) by mouth daily at 6 PM. 30 tablet 9  . buPROPion (WELLBUTRIN XL) 300 MG 24 hr tablet Take 300 mg by mouth at bedtime.     Marland Kitchen diltiazem (CARDIZEM) 30 MG tablet Take 1 tablet every 4 hours AS NEEDED for AFIB heart rate >100 45 tablet 1  . dofetilide (TIKOSYN) 500 MCG capsule Take 1 capsule (500 mcg total) by mouth 2 (two) times daily. 180 capsule 2  . ELIQUIS 5 MG TABS tablet TAKE 1 TABLET(5 MG) BY MOUTH TWICE DAILY 180 tablet 1  . imipramine (TOFRANIL) 25 MG tablet Take 25 mg by mouth at bedtime.     . metoprolol tartrate (LOPRESSOR) 50 MG tablet Take 1 tablet (50 mg total) by mouth 2 (two) times daily. 180 tablet 3  . Multiple Vitamins-Minerals (MULTIVITAMIN WITH MINERALS) tablet Take 1 tablet by mouth daily. 50 + Vitamin    .  ranitidine (ZANTAC) 300 MG tablet Take 300 mg by mouth at bedtime.     No current facility-administered medications for this encounter.     Allergies  Allergen Reactions  . Codeine Nausea And Vomiting    Social History   Socioeconomic History  . Marital status: Legally Separated    Spouse name: Not on file  . Number of children: 1  . Years of education: Not on file  . Highest education level: Not on file  Occupational History  . Not on file  Social Needs  . Financial resource strain: Not on file  . Food insecurity:    Worry: Not on file    Inability: Not on file  . Transportation needs:    Medical: Not on file    Non-medical: Not on file  Tobacco Use  . Smoking status: Former Smoker    Packs/day: 1.00    Years:  8.00    Pack years: 8.00    Types: Cigarettes    Last attempt to quit: 11/04/1990    Years since quitting: 27.9  . Smokeless tobacco: Never Used  Substance and Sexual Activity  . Alcohol use: Not on file  . Drug use: No  . Sexual activity: Not Currently    Birth control/protection: Post-menopausal  Lifestyle  . Physical activity:    Days per week: Not on file    Minutes per session: Not on file  . Stress: Not on file  Relationships  . Social connections:    Talks on phone: Not on file    Gets together: Not on file    Attends religious service: Not on file    Active member of club or organization: Not on file    Attends meetings of clubs or organizations: Not on file    Relationship status: Not on file  . Intimate partner violence:    Fear of current or ex partner: Not on file    Emotionally abused: Not on file    Physically abused: Not on file    Forced sexual activity: Not on file  Other Topics Concern  . Not on file  Social History Narrative   [place].  Does not routinely exercise.  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    Family History  Problem Relation Age of Onset  . Lung cancer Father   . Alcohol abuse Father   . Kidney failure Mother        s/p renal Tx  . Congestive Heart Failure Mother   . Atrial fibrillation Brother   . Sudden Cardiac Death Daughter        cardiac arrest  . Cardiomyopathy Daughter   . Evelene CroonWolff Parkinson White syndrome Grandchild         granddaughter    ROS- All systems are reviewed and negative except as per the HPI above  Physical Exam: Vitals:   09/25/18 0848  BP: 118/74  Pulse: 69  Weight: (!) 138.3 kg  Height: 5\' 7"  (1.702 m)   Wt Readings from Last 3 Encounters:  09/25/18 (!) 138.3 kg  09/18/18 (!) 137.8 kg  09/15/18 (!) 137.9 kg    Labs: Lab Results  Component Value Date   NA 139 09/18/2018   K 4.4 09/18/2018   CL 105 09/18/2018   CO2 26 09/18/2018   GLUCOSE 95 09/18/2018   BUN 8 09/18/2018   CREATININE  0.95 09/18/2018   CALCIUM 8.9 09/18/2018   MG 2.3 09/18/2018   Lab Results  Component Value  Date   INR 1.11 04/15/2017   Lab Results  Component Value Date   CHOL 186 04/23/2014   HDL 53 04/23/2014   LDLCALC 109 (H) 04/23/2014   TRIG 122 04/23/2014     GEN- The patient is well appearing, alert and oriented x 3 today.   Head- normocephalic, atraumatic Eyes-  Sclera clear, conjunctiva pink Ears- hearing intact Oropharynx- clear Neck- supple, no JVP Lymph- no cervical lymphadenopathy Lungs- Clear to ausculation bilaterally, normal work of breathing Heart- Regular rate and rhythm, no murmurs, rubs or gallops, PMI not laterally displaced GI- soft, NT, ND, + BS Extremities- no clubbing, cyanosis, or edema MS- no significant deformity or atrophy Skin- no rash or lesion Psych- euthymic mood, full affect Neuro- strength and sensation are intact  EKG-NSR at 69 bpm , pr int 216 ms, qrs int 88 ms, qtc 445 ms Epic records reviewed Echo-04/17/17-Study Conclusions  - Left ventricle: The cavity size was normal. Systolic function was   normal. The estimated ejection fraction was in the range of 55%   to 60%. Wall motion was normal; there were no regional wall   motion abnormalities. - Aortic valve: There was trivial regurgitation. - Left atrium: The atrium was mildly dilated.     Assessment and Plan: 1. Paroxysmal afib Recent increase in afib burden,despite flecainide use, now off flecainide x 3 days and  recently underwent tikosyn load General precautions re tikosyn use Can afford drug, will cost $20 a month Qtc is acceptable at 445 ms No benadryl use No missed doses of eliquis 5 mg bid Bmet/mag today  F/u with Dr. Elberta Fortis in one month  Elvina Sidle. [facility] [address] [place], [place] [address] [phone]

## 2018-10-05 ENCOUNTER — Other Ambulatory Visit: Payer: Self-pay

## 2018-10-05 MED ORDER — APIXABAN 5 MG PO TABS: ORAL_TABLET | ORAL | 1 refills | Status: DC

## 2018-10-05 NOTE — Telephone Encounter (Signed)
Pt last saw Dr Camnitz on 08/27/18, last labs 09/18/18 Creat 0.95, age 57, weight 137.9kg, based on specified criteria pt is on appropriate dosage of Eliquis 5mg BID.  Will refill rx.  

## 2018-10-12 ENCOUNTER — Encounter: Payer: Self-pay | Admitting: *Deleted

## 2018-10-16 ENCOUNTER — Encounter: Payer: Self-pay | Admitting: Cardiology

## 2018-10-16 ENCOUNTER — Ambulatory Visit (INDEPENDENT_AMBULATORY_CARE_PROVIDER_SITE_OTHER): Payer: Managed Care, Other (non HMO) | Admitting: Cardiology

## 2018-10-16 VITALS — BP 140/80 | HR 63 | Ht 67.0 in | Wt 306.0 lb

## 2018-10-16 DIAGNOSIS — I480 Paroxysmal atrial fibrillation: Secondary | ICD-10-CM

## 2018-10-16 DIAGNOSIS — I10 Essential (primary) hypertension: Secondary | ICD-10-CM | POA: Diagnosis not present

## 2018-10-16 DIAGNOSIS — G4733 Obstructive sleep apnea (adult) (pediatric): Secondary | ICD-10-CM | POA: Diagnosis not present

## 2018-10-16 NOTE — Progress Notes (Signed)
Electrophysiology Office Note   Date:  10/16/2018   ID:  Cendy A Morales, DOB 03/06/1961, MRN [id number]  PCP:  Asencion GowdaMitchell, L.August Saucerean, MD  Cardiologist:  Eden EmmsNishan Primary Electrophysiologist:  Dr Elberta Fortisamnitz    CC: Follow up for paroxsymal atrial fibrillation.   History of Present Illness: Regina Morales is a 57 y.o. female who is being seen today for the evaluation of atrial fibrillation at the request of Rudi CocoDonna Carroll. Presenting today for electrophysiology evaluation.  She has a history of paroxysmal atrial fibrillation, hypertension, OSA on CPAP.  She took flecainide for her atrial fibrillation, but had more frequent episodes.  She is avoiding caffeine and alcohol.  She does struggle with weight loss.  She has been in atrial fibrillation once since the increased dose of her beta-blocker.  She was admitted to the hospital 09/15/2018 for dofetilide loading.  Today, denies symptoms of palpitations, chest pain, shortness of breath, orthopnea, PND, lower extremity edema, claudication, dizziness, presyncope, syncope, bleeding, or neurologic sequela. The patient is tolerating medications without difficulties. She states that since starting the dofetilide, she has only had one brief episode of afib which lasted less than 5 minutes. She reports that she feels like she has more energy and much less SOB.    Past Medical History:  Diagnosis Date  . Anemia    hx years ago  . Buzzing in ear    "right; even after OR"  . Chest pain    a. 2009: neg MV  ([facility])  . Depression   . DVT (deep vein thrombosis) in pregnancy 1980's   LLE  . Fibromyalgia    "dx'd many many years ago; I haven't had any problems w/it" (04/15/2017)  . GERD (gastroesophageal reflux disease)   . Hepatic steatosis   . High cholesterol   . History of blood transfusion    "low count after appendix surgery" unsure # of units transfused  . Hypertension   . Migraines    "maybe once/2 months" (04/15/2017)  . Obesity   . OSA on CPAP     . Persistent atrial fibrillation    a. Dx 07/2013;  b. CHA2DS2VASc=1 (female);  c. 07/2013 Echo:  EF 50-55%, no rwma, mod LVH.  . Pulmonary embolism (HCC) ~ 1984   "after I had my last child"  . SVD (spontaneous vaginal delivery)    x 4   Past Surgical History:  Procedure Laterality Date  . APPENDECTOMY  1990's  . COLONOSCOPY    . DILATATION & CURETTAGE/HYSTEROSCOPY WITH MYOSURE N/A 04/14/2017   Procedure: DILATATION & CURETTAGE/HYSTEROSCOPY WITH MYOSURE;  Surgeon: Myna Hidalgozan, Jennifer, DO;  Location: [facility];  Service: Gynecology;  Laterality: N/A;  Polypectomy  . DILATION AND CURETTAGE OF UTERUS  1983   mab  . INNER EAR SURGERY Right 2000's  . LEFT HEART CATH AND CORONARY ANGIOGRAPHY N/A 04/17/2017   Procedure: Left Heart Cath and Coronary Angiography;  Surgeon: Corky CraftsVaranasi, Jayadeep S, MD;  [facility];  Service: Cardiovascular;  Laterality: N/A;  . PLANTAR FASCIA RELEASE Right 07/2015  . TUBAL LIGATION  1985     Current Outpatient Medications  Medication Sig Dispense Refill  . apixaban (ELIQUIS) 5 MG TABS tablet TAKE 1 TABLET(5 MG) BY MOUTH TWICE DAILY 180 tablet 1  . atorvastatin (LIPITOR) 20 MG tablet Take 1 tablet (20 mg total) by mouth daily at 6 PM. 30 tablet 9  . buPROPion (WELLBUTRIN XL) 300 MG 24 hr tablet Take 300 mg by mouth at bedtime.     .Marland Kitchen  diltiazem (CARDIZEM) 30 MG tablet Take 1 tablet every 4 hours AS NEEDED for AFIB heart rate >100 45 tablet 1  . dofetilide (TIKOSYN) 500 MCG capsule Take 1 capsule (500 mcg total) by mouth 2 (two) times daily. 180 capsule 2  . imipramine (TOFRANIL) 25 MG tablet Take 25 mg by mouth at bedtime.     . metoprolol tartrate (LOPRESSOR) 50 MG tablet Take 1 tablet (50 mg total) by mouth 2 (two) times daily. 180 tablet 3  . Multiple Vitamins-Minerals (MULTIVITAMIN WITH MINERALS) tablet Take 1 tablet by mouth daily. 50 + Vitamin    . ranitidine (ZANTAC) 300 MG tablet Take 300 mg by mouth at bedtime.     No current  facility-administered medications for this visit.     Allergies:   Codeine   Social History:  The patient  reports that she quit smoking about 27 years ago. Her smoking use included cigarettes. She has a 8.00 pack-year smoking history. She has never used smokeless tobacco. She reports that she does not use drugs.   Family History:  The patient's family history includes Alcohol abuse in her father; Atrial fibrillation in her brother; Cardiomyopathy in her daughter; Congestive Heart Failure in her mother; Kidney failure in her mother; Lung cancer in her father; Sudden Cardiac Death in her daughter; Wolff Parkinson White syndrome in her grandchild.   ROS:  Please see the history of present illness. All other systems are reviewed and negative.   PHYSICAL EXAM: VS:  BP 140/80   Pulse 63   Ht 5\' 7"  (1.702 m)   Wt (!) 306 lb (138.8 kg)   SpO2 98%   BMI 47.93 kg/m  , BMI Body mass index is 47.93 kg/m. GEN: Well nourished obese female, well developed, in no acute distress  HEENT: normal  Neck: no JVD, carotid bruits, or masses Cardiac: RRR; no murmurs, rubs, or gallops,no edema  Respiratory:  clear to auscultation bilaterally, normal work of breathing MS: no deformity or atrophy  Skin: warm and dry Neuro:  Strength and sensation are intact Psych: euthymic mood, full affect  EKG:  EKG is ordered today. Personal review of the ekg ordered shows sinus rhythm, 1st degree AV block, PR 208, QRS 94, QTc 452   Recent Labs: 08/07/2018: Hemoglobin 12.6; Platelets 335; TSH 1.000 09/18/2018: BUN 8; Creatinine, Ser 0.95; Magnesium 2.3; Potassium 4.4; Sodium 139    Lipid Panel     Component Value Date/Time   CHOL 186 04/23/2014 0018   TRIG 122 04/23/2014 0018   HDL 53 04/23/2014 0018   CHOLHDL 3.5 04/23/2014 0018   VLDL 24 04/23/2014 0018   LDLCALC 109 (H) 04/23/2014 0018     Wt Readings from Last 3 Encounters:  10/16/18 (!) 306 lb (138.8 kg)  09/25/18 (!) 305 lb (138.3 kg)  09/18/18 (!)  303 lb 12.8 oz (137.8 kg)      Other studies Reviewed: Additional studies/ records that were reviewed today include: TTE 04/17/17  Review of the above records today demonstrates:  - Left ventricle: The cavity size was normal. Systolic function was   normal. The estimated ejection fraction was in the range of 55%   to 60%. Wall motion was normal; there were no regional wall   motion abnormalities. - Aortic valve: There was trivial regurgitation. - Left atrium: The atrium was mildly dilated.  LHC 04/17/17  The left ventricular systolic function is normal.  LV end diastolic pressure is moderately elevated. LVEDP 25 mm Hg.  The left ventricular  ejection fraction is 50-55% by visual estimate.  There is no aortic valve stenosis.  No angiographically apparent coronary artery disease.   ASSESSMENT AND PLAN:  1.  Paroxysmal atrial fibrillation: S/p dofetilide loading 09/2018. She reports that overall she has felt much better symptomatically, only one very brief episode. QT stable Continue dofetilide BID Continue Eliquis 5mg  BID Bmet/mag next visit  This patients CHA2DS2-VASc Score and unadjusted Ischemic Stroke Rate (% per year) is equal to 2.2 % stroke rate/year from a score of 2  Above score calculated as 1 point each if present [CHF, HTN, DM, Vascular=MI/PAD/Aortic Plaque, Age if 65-74, or Female] Above score calculated as 2 points each if present [Age > 75, or Stroke/TIA/TE]  2.  Obstructive sleep apnea: Patient reports she has been compliant with her CPAP  3.  Hypertension: BP borderline today. Patient agrees to keep track of her BP at home and contact us with persistently high readings. Lifestyle modifications encouraged.  4. Obesity Body mass index is 47.93 kg/m. We discussed lifestyle modifications in detail including diet and physical activity.  Current medicines are reviewed at length with the patient today.   The patient does not have concerns regarding her  medicines.  The following changes were made today:  none  Labs/ tests ordered today include:  No orders of the defined types were placed in this encounter.   Disposition:   FU with Temple Ewart 6 months  Sherrie Mustache, [place]  10/16/2018 12:50 PM     [facility] [address] [place] [address] [phone] (office) [phone] (fax)  I have seen and examined this patient with Jorja Loa.  Agree with above, note added to reflect my findings.  On exam, RRR, no murmurs, lungs clear.  He is follow-up from hospitalization for dofetilide loading.  She remains in sinus rhythm.  Has had minimal atrial fibrillation since her load and is feeling much improved without weakness, fatigue, or shortness of breath.  She is able to do all of her daily activities.  QTC remains stable.  No changes.  Harald Quevedo M. Natesha Hassey MD 10/16/2018 3:09 PM

## 2018-10-16 NOTE — Patient Instructions (Signed)
Medication Instructions:  Your physician recommends that you continue on your current medications as directed. Please refer to the Current Medication list given to you today.  * If you need a refill on your cardiac medications before your next appointment, please call your pharmacy.   Labwork: Your physician recommends that you return for lab work at your next visit with Dr. Elberta Fortisamnitz.  BMET & Magnesium levle   Testing/Procedures: None ordered  Follow-Up: Your physician wants you to follow-up in: 6 months with Dr. Elberta Fortisamnitz.  You will receive a reminder letter in the mail two months in advance. If you don't receive a letter, please call our office to schedule the follow-up appointment.   Thank you for choosing [facility]!!   Dory HornSherri Ayzia Day, RN [phone]

## 2018-10-19 NOTE — Addendum Note (Signed)
Addended by: Brendaly Townsel H on: 10/19/2018 11:25 AM   Modules accepted: Orders  

## 2018-10-26 ENCOUNTER — Ambulatory Visit (HOSPITAL_COMMUNITY)
Admission: EM | Admit: 2018-10-26 | Discharge: 2018-10-26 | Disposition: A | Discharge status: Home / Self Care | Payer: Managed Care, Other (non HMO) | Attending: Family Medicine | Admitting: Family Medicine

## 2018-10-26 ENCOUNTER — Encounter (HOSPITAL_COMMUNITY): Payer: Self-pay | Admitting: Emergency Medicine

## 2018-10-26 ENCOUNTER — Other Ambulatory Visit: Payer: Self-pay

## 2018-10-26 DIAGNOSIS — N939 Abnormal uterine and vaginal bleeding, unspecified: Secondary | ICD-10-CM | POA: Diagnosis not present

## 2018-10-26 LAB — POCT I-STAT, CHEM 8
BUN: 16 mg/dL (ref 6–20)
CALCIUM ION: 1.17 mmol/L (ref 1.15–1.40)
CHLORIDE: 102 mmol/L (ref 98–111)
CREATININE: 0.9 mg/dL (ref 0.44–1.00)
GLUCOSE: 87 mg/dL (ref 70–99)
HCT: 42 % (ref 36.0–46.0)
Hemoglobin: 14.3 g/dL (ref 12.0–15.0)
Potassium: 4.3 mmol/L (ref 3.5–5.1)
Sodium: 138 mmol/L (ref 135–145)
TCO2: 28 mmol/L (ref 22–32)

## 2018-10-26 LAB — POCT URINALYSIS DIP (DEVICE)
BILIRUBIN URINE: NEGATIVE
Glucose, UA: NEGATIVE mg/dL
Ketones, ur: NEGATIVE mg/dL
NITRITE: NEGATIVE
PH: 6 (ref 5.0–8.0)
Protein, ur: NEGATIVE mg/dL
Specific Gravity, Urine: 1.01 (ref 1.005–1.030)
Urobilinogen, UA: 0.2 mg/dL (ref 0.0–1.0)

## 2018-10-26 NOTE — ED Triage Notes (Addendum)
Pt reports vaginal bleeding that started last Thursday.  She is post menopausal and had a uterine mass removed from her uterus last year.  She is having associated lower abdominal and back pain.  Pt was recently in the hospital for a loading dose of Tikosyn.  She states she recently had her kidney function tested because of this.

## 2018-10-26 NOTE — Discharge Instructions (Addendum)
Bleeding does appear to be coming from the uterus. Please follow up with women's clinic or OBGYN for further evaluation of pain and bleeding I do not want to start you on medicine to stop the bleeding given your eliquis and a fib. Please monitor for now If having worsening pain please go to emergency room

## 2018-10-28 NOTE — ED Provider Notes (Signed)
[facility]    CSN: [id number] Arrival date & time: 10/26/18  1507     History   Chief Complaint Chief Complaint  Patient presents with  . Vaginal Bleeding    HPI Regina Morales is a 57 y.o. female history of A. fib on Eliquis presenting today for evaluation of vaginal bleeding.  Patient states that for the past 4 to 5 days she has had vaginal bleeding.  Bleeding has been relatively light, but she has noticed this every time she has gone to the bathroom and wiped.  She has a history of a previous polyp removal and had spotting at that time, and has been concerned as this bleeding is more than normal.  She has been postmenopausal for years.  She is having some mild lower abdominal discomfort associated with this.  She states that the bleeding is definitely vaginal and not rectal.  Bowel movements normal.  HPI  Past Medical History:  Diagnosis Date  . Anemia    hx years ago  . Buzzing in ear    "right; even after OR"  . Chest pain    a. 2009: neg MV  ([facility])  . Depression   . DVT (deep vein thrombosis) in pregnancy 1980's   LLE  . Fibromyalgia    "dx'd many many years ago; I haven't had any problems w/it" (04/15/2017)  . GERD (gastroesophageal reflux disease)   . Hepatic steatosis   . High cholesterol   . History of blood transfusion    "low count after appendix surgery" unsure # of units transfused  . Hypertension   . Migraines    "maybe once/2 months" (04/15/2017)  . Obesity   . OSA on CPAP   . Persistent atrial fibrillation    a. Dx 07/2013;  b. CHA2DS2VASc=1 (female);  c. 07/2013 Echo:  EF 50-55%, no rwma, mod LVH.  . Pulmonary embolism (HCC) ~ 1984   "after I had my last child"  . SVD (spontaneous vaginal delivery)    x 4    Patient Active Problem List   Diagnosis Date Noted  . Visit for monitoring Tikosyn therapy 09/15/2018  . 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 Tendinitis of right elbow 11/25/2017  . Abnormal nuclear stress test   . Essential  hypertension   . Endometrial polyp   . Persistent atrial fibrillation   . Normocytic anemia 04/24/2014  . Depression 04/22/2014  . Migraine 04/22/2014  . PAF (paroxysmal atrial fibrillation) (HCC)   . GERD (gastroesophageal reflux disease)   . Atypical chest pain   . Obesity     Past Surgical History:  Procedure Laterality Date  . APPENDECTOMY  1990's  . COLONOSCOPY    . DILATATION & CURETTAGE/HYSTEROSCOPY WITH MYOSURE N/A 04/14/2017   Procedure: DILATATION & CURETTAGE/HYSTEROSCOPY WITH MYOSURE;  Surgeon: Myna Hidalgozan, Jennifer, DO;  Location: [facility];  Service: Gynecology;  Laterality: N/A;  Polypectomy  . DILATION AND CURETTAGE OF UTERUS  1983   mab  . INNER EAR SURGERY Right 2000's  . LEFT HEART CATH AND CORONARY ANGIOGRAPHY N/A 04/17/2017   Procedure: Left Heart Cath and Coronary Angiography;  Surgeon: Corky CraftsVaranasi, Jayadeep S, MD;  [facility];  Service: Cardiovascular;  Laterality: N/A;  . PLANTAR FASCIA RELEASE Right 07/2015  . TUBAL LIGATION  1985    OB History   No obstetric history on file.      Home Medications    Prior to Admission medications   Medication Sig Start Date End Date Taking?  Authorizing Provider  apixaban (ELIQUIS) 5 MG TABS tablet TAKE 1 TABLET(5 MG) BY MOUTH TWICE DAILY 10/05/18  Yes Wendall Stade, MD  atorvastatin (LIPITOR) 20 MG tablet Take 1 tablet (20 mg total) by mouth daily at 6 PM. 04/18/17  Yes Neva Seat, Tiffany, PA-C  buPROPion (WELLBUTRIN XL) 300 MG 24 hr tablet Take 300 mg by mouth at bedtime.    Yes [provider]  dofetilide (TIKOSYN) 500 MCG capsule Take 1 capsule (500 mcg total) by mouth 2 (two) times daily. 09/18/18  Yes Sheilah Pigeon, PA-C  imipramine (TOFRANIL) 25 MG tablet Take 25 mg by mouth at bedtime.  01/14/16  Yes [provider]  metoprolol tartrate (LOPRESSOR) 50 MG tablet Take 1 tablet (50 mg total) by mouth 2 (two) times daily. 08/07/18 11/05/18 Yes Bhagat, Bhavinkumar, PA  Multiple Vitamins-Minerals  (MULTIVITAMIN WITH MINERALS) tablet Take 1 tablet by mouth daily. 50 + Vitamin   Yes [provider]  ranitidine (ZANTAC) 300 MG tablet Take 300 mg by mouth at bedtime.   Yes [provider]  diltiazem (CARDIZEM) 30 MG tablet Take 1 tablet every 4 hours AS NEEDED for AFIB heart rate >100 12/25/17   Newman Nip, NP    Family History Family History  Problem Relation Age of Onset  . Lung cancer Father   . Alcohol abuse Father   . Kidney failure Mother        s/p renal Tx  . Congestive Heart Failure Mother   . Atrial fibrillation Brother   . Sudden Cardiac Death Daughter        cardiac arrest  . Cardiomyopathy Daughter   . Evelene Croon Parkinson White syndrome Grandchild         granddaughter    Social History Social History   Tobacco Use  . Smoking status: Former Smoker    Packs/day: 1.00    Years: 8.00    Pack years: 8.00    Types: Cigarettes    Last attempt to quit: 11/04/1990    Years since quitting: 28.0  . Smokeless tobacco: Never Used  Substance Use Topics  . Alcohol use: Not on file  . Drug use: No     Allergies   Codeine   Review of Systems Review of Systems  Constitutional: Negative for fever.  Respiratory: Negative for shortness of breath.   Cardiovascular: Negative for chest pain.  Gastrointestinal: Positive for abdominal pain. Negative for diarrhea, nausea and vomiting.  Genitourinary: Positive for vaginal bleeding. Negative for dysuria, flank pain, genital sores, hematuria, menstrual problem, vaginal discharge and vaginal pain.  Musculoskeletal: Negative for back pain.  Skin: Negative for rash.  Neurological: Negative for dizziness, light-headedness and headaches.     Physical Exam Triage Vital Signs ED Triage Vitals  Enc Vitals Group     BP 10/26/18 1533 (!) 151/83     Pulse Rate 10/26/18 1533 81     Resp 10/26/18 1533 16     Temp 10/26/18 1533 98.1 F (36.7 C)     Temp Source 10/26/18 1533 Oral     SpO2 10/26/18 1533 100 %      Weight --      Height --      Head Circumference --      Peak Flow --      Pain Score 10/26/18 1551 6     Pain Loc --      Pain Edu? --      Excl. in GC? --    No data  found.  Updated Vital Signs BP (!) 151/83 (BP Location: Left Arm)   Pulse 81   Temp 98.1 F (36.7 C) (Oral)   Resp 16   SpO2 100%   Visual Acuity Right Eye Distance:   Left Eye Distance:   Bilateral Distance:    Right Eye Near:   Left Eye Near:    Bilateral Near:     Physical Exam Vitals signs and nursing note reviewed.  Constitutional:      Appearance: She is well-developed.     Comments: No acute distress  HENT:     Head: Normocephalic and atraumatic.     Nose: Nose normal.  Eyes:     Conjunctiva/sclera: Conjunctivae normal.  Neck:     Musculoskeletal: Neck supple.  Cardiovascular:     Rate and Rhythm: Normal rate.  Pulmonary:     Effort: Pulmonary effort is normal. No respiratory distress.  Abdominal:     General: There is no distension.     Comments: Abdomen soft, nondistended, tender to palpation to right and left lower quadrants and suprapubic area.  Negative rebound, no focal tenderness.  Negative McBurney's, negative Rovsing's.  Genitourinary:    Comments: Normal external female genitalia, no external sources of bleeding, on speculum exam red blood in vagina, seen exiting cervical os, no cervical erythema or other discharge observed.  No adnexal masses palpated.  No cervical motion tenderness. Musculoskeletal: Normal range of motion.  Skin:    General: Skin is warm and dry.  Neurological:     Mental Status: She is alert and oriented to person, place, and time.      UC Treatments / Results  Labs (all labs ordered are listed, but only abnormal results are displayed) Labs Reviewed  POCT URINALYSIS DIP (DEVICE) - Abnormal; Notable for the following components:      Result Value   Hgb urine dipstick LARGE (*)    Leukocytes, UA TRACE (*)    All other components within normal limits    I-STAT CHEM 8, ED  POCT I-STAT, CHEM 8    EKG None  Radiology No results found.  Procedures Procedures (including critical care time)  Medications Ordered in UC Medications - No data to display  Initial Impression / Assessment and Plan / UC Course  I have reviewed the triage vital signs and the nursing notes.  Pertinent labs & imaging results that were available during my care of the patient were reviewed by me and considered in my medical decision making (see chart for details).     57 year old female with abnormal vaginal bleeding.  Appears to be coming from uterus.  Given patient on Eliquis, hemoglobin stable and bleeding is light, will opt to continue to monitor bleeding.  Will hold off on Megace or Provera to avoid increasing thrombo-embolic risk.  Will have patient follow-up with OB/GYN for further evaluation of causes of this bleeding.  In the meantime continue to monitor pain and bleeding, go to emergency room if symptoms worsening, developing worsening pain, heavy bleeding, lightheadedness or dizziness.Discussed strict return precautions. Patient verbalized understanding and is agreeable with plan.  Final Clinical Impressions(s) / UC Diagnoses   Final diagnoses:  Vaginal bleeding     Discharge Instructions     Bleeding does appear to be coming from the uterus. Please follow up with women's clinic or OBGYN for further evaluation of pain and bleeding I do not want to start you on medicine to stop the bleeding given your eliquis and a fib. Please monitor  for now If having worsening pain please go to emergency room   ED Prescriptions    None     Controlled Substance Prescriptions [place] Controlled Substance Registry consulted? Not Applicable   Lew Dawes, [place] 10/28/18 7829

## 2018-10-29 ENCOUNTER — Telehealth (HOSPITAL_COMMUNITY): Payer: Self-pay | Admitting: *Deleted

## 2018-10-29 NOTE — Telephone Encounter (Signed)
Pt cld reporting vaginal bleeding x 1 week, heavy at times.  She is postmenopausal x 7 years and wanted to know if her Eliquis could be causing this.  Pt has gone to UC and ruled out UTI, Hgb issues, Kidney issues.   Per Dr. Johney FrameAllred since not post ablation within 3 months, not post dccv, Tikosyn over 1 month, pt can discontinue Eliquis x 3 days only and then restart.  Pt should also see GYN ASAP to determine what is causing bleeding.  Pt understood advice and will restart after 3 days.

## 2018-11-12 ENCOUNTER — Encounter (HOSPITAL_COMMUNITY): Payer: Self-pay

## 2018-11-12 ENCOUNTER — Ambulatory Visit (INDEPENDENT_AMBULATORY_CARE_PROVIDER_SITE_OTHER): Payer: BLUE CROSS/BLUE SHIELD

## 2018-11-12 ENCOUNTER — Other Ambulatory Visit: Payer: Self-pay

## 2018-11-12 ENCOUNTER — Ambulatory Visit (HOSPITAL_COMMUNITY)
Admission: EM | Admit: 2018-11-12 | Discharge: 2018-11-12 | Disposition: A | Discharge status: Home / Self Care | Payer: BLUE CROSS/BLUE SHIELD | Attending: Family Medicine | Admitting: Family Medicine

## 2018-11-12 DIAGNOSIS — R05 Cough: Secondary | ICD-10-CM | POA: Diagnosis not present

## 2018-11-12 DIAGNOSIS — J209 Acute bronchitis, unspecified: Secondary | ICD-10-CM

## 2018-11-12 DIAGNOSIS — R0602 Shortness of breath: Secondary | ICD-10-CM | POA: Diagnosis not present

## 2018-11-12 DIAGNOSIS — N6002 Solitary cyst of left breast: Secondary | ICD-10-CM | POA: Diagnosis not present

## 2018-11-12 MED ORDER — HYDROCODONE-HOMATROPINE 5-1.5 MG/5ML PO SYRP: 5.0000 mL | ORAL_SOLUTION | Freq: Every evening | ORAL | 0 refills | Status: DC | PRN

## 2018-11-12 MED ORDER — DOXYCYCLINE HYCLATE 100 MG PO CAPS: 100.0000 mg | ORAL_CAPSULE | Freq: Two times a day (BID) | ORAL | 0 refills | Status: AC

## 2018-11-12 MED ORDER — PREDNISONE 50 MG PO TABS: 50.0000 mg | ORAL_TABLET | Freq: Every day | ORAL | 0 refills | Status: AC

## 2018-11-12 MED ORDER — BENZONATATE 200 MG PO CAPS: 200.0000 mg | ORAL_CAPSULE | Freq: Three times a day (TID) | ORAL | 0 refills | Status: AC | PRN

## 2018-11-12 NOTE — ED Provider Notes (Signed)
[facility]    CSN: [id number] Arrival date & time: 11/12/18  1659     History   Chief Complaint Chief Complaint  Patient presents with  . Cough    HPI Regina Morales is a 58 y.o. female history of paroxysmal A. fib, hypertension, previous PE, presenting today for evaluation of a cough.  Patient states that she has had a cough for the past month, but symptoms have worsened over the past week.  She states that she has had some nasal congestion off and on, but her main complaint is the cough.  She is having difficulty sleeping at night due to the cough waking her up.  Cough is occasionally productive.  Denies any fevers.  She has had sore throat related to this, but feels it is secondary to cough.  Patient is a previous smoker, smoked for approximately 7 years, quit 20 years ago.  HPI  Past Medical History:  Diagnosis Date  . Anemia    hx years ago  . Buzzing in ear    "right; even after OR"  . Chest pain    a. 2009: neg MV  ([facility])  . Depression   . DVT (deep vein thrombosis) in pregnancy 1980's   LLE  . Fibromyalgia    "dx'd many many years ago; I haven't had any problems w/it" (04/15/2017)  . GERD (gastroesophageal reflux disease)   . Hepatic steatosis   . High cholesterol   . History of blood transfusion    "low count after appendix surgery" unsure # of units transfused  . Hypertension   . Migraines    "maybe once/2 months" (04/15/2017)  . Obesity   . OSA on CPAP   . Persistent atrial fibrillation    a. Dx 07/2013;  b. CHA2DS2VASc=1 (female);  c. 07/2013 Echo:  EF 50-55%, no rwma, mod LVH.  . Pulmonary embolism (HCC) ~ 1984   "after I had my last child"  . SVD (spontaneous vaginal delivery)    x 4    Patient Active Problem List   Diagnosis Date Noted  . Visit for monitoring Tikosyn therapy 09/15/2018  . 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 Tendinitis of right elbow 11/25/2017  . Abnormal nuclear stress test   . Essential hypertension   .  Endometrial polyp   . Persistent atrial fibrillation   . Normocytic anemia 04/24/2014  . Depression 04/22/2014  . Migraine 04/22/2014  . PAF (paroxysmal atrial fibrillation) (HCC)   . GERD (gastroesophageal reflux disease)   . Atypical chest pain   . Obesity     Past Surgical History:  Procedure Laterality Date  . APPENDECTOMY  1990's  . COLONOSCOPY    . DILATATION & CURETTAGE/HYSTEROSCOPY WITH MYOSURE N/A 04/14/2017   Procedure: DILATATION & CURETTAGE/HYSTEROSCOPY WITH MYOSURE;  Surgeon: Myna Hidalgo, DO;  Location: [facility];  Service: Gynecology;  Laterality: N/A;  Polypectomy  . DILATION AND CURETTAGE OF UTERUS  1983   mab  . INNER EAR SURGERY Right 2000's  . LEFT HEART CATH AND CORONARY ANGIOGRAPHY N/A 04/17/2017   Procedure: Left Heart Cath and Coronary Angiography;  Surgeon: Corky Crafts, MD;  Location: [facility];  Service: Cardiovascular;  Laterality: N/A;  . PLANTAR FASCIA RELEASE Right 07/2015  . TUBAL LIGATION  1985    OB History   No obstetric history on file.      Home Medications    Prior to Admission medications   Medication Sig Start Date End Date Taking? Authorizing Provider  apixaban (ELIQUIS) 5 MG TABS tablet TAKE 1 TABLET(5 MG) BY MOUTH TWICE DAILY 10/05/18   Wendall StadeNishan, Peter C, MD  atorvastatin (LIPITOR) 20 MG tablet Take 1 tablet (20 mg total) by mouth daily at 6 PM. 04/18/17   Neva SeatGreene, Tiffany, PA-C  benzonatate (TESSALON) 200 MG capsule Take 1 capsule (200 mg total) by mouth 3 (three) times daily as needed for up to 7 days for cough. During day 11/12/18 11/19/18  ,  C, PA-C  buPROPion (WELLBUTRIN XL) 300 MG 24 hr tablet Take 300 mg by mouth at bedtime.     [provider]  diltiazem (CARDIZEM) 30 MG tablet Take 1 tablet every 4 hours AS NEEDED for AFIB heart rate >100 12/25/17   Newman Niparroll, Donna C, NP  dofetilide (TIKOSYN) 500 MCG capsule Take 1 capsule (500 mcg total) by mouth 2 (two) times daily. 09/18/18   Sheilah PigeonUrsuy, [name] Lynn,  PA-C  doxycycline (VIBRAMYCIN) 100 MG capsule Take 1 capsule (100 mg total) by mouth 2 (two) times daily for 10 days. 11/12/18 11/22/18  ,  C, PA-C  HYDROcodone-homatropine (HYCODAN) 5-1.5 MG/5ML syrup Take 5 mLs by mouth at bedtime as needed for cough. 11/12/18   ,  C, PA-C  imipramine (TOFRANIL) 25 MG tablet Take 25 mg by mouth at bedtime.  01/14/16   [provider]  metoprolol tartrate (LOPRESSOR) 50 MG tablet Take 1 tablet (50 mg total) by mouth 2 (two) times daily. 08/07/18 11/05/18  Manson PasseyBhagat, Bhavinkumar, PA  Multiple Vitamins-Minerals (MULTIVITAMIN WITH MINERALS) tablet Take 1 tablet by mouth daily. 50 + Vitamin    [provider]  predniSONE (DELTASONE) 50 MG tablet Take 1 tablet (50 mg total) by mouth daily for 5 days. 11/12/18 11/17/18  ,  C, PA-C  ranitidine (ZANTAC) 300 MG tablet Take 300 mg by mouth at bedtime.    [provider]    Family History Family History  Problem Relation Age of Onset  . Lung cancer Father   . Alcohol abuse Father   . Kidney failure Mother        s/p renal Tx  . Congestive Heart Failure Mother   . Atrial fibrillation Brother   . Sudden Cardiac Death Daughter        cardiac arrest  . Cardiomyopathy Daughter   . Evelene CroonWolff Parkinson White syndrome Grandchild         granddaughter    Social History Social History   Tobacco Use  . Smoking status: Former Smoker    Packs/day: 1.00    Years: 8.00    Pack years: 8.00    Types: Cigarettes    Last attempt to quit: 11/04/1990    Years since quitting: 28.0  . Smokeless tobacco: Never Used  Substance Use Topics  . Alcohol use: Not on file  . Drug use: No     Allergies   Codeine   Review of Systems Review of Systems  Constitutional: Negative for activity change, appetite change, chills, fatigue and fever.  HENT: Positive for congestion, rhinorrhea and sore throat. Negative for ear pain, sinus pressure and trouble swallowing.   Eyes: Negative for  discharge and redness.  Respiratory: Positive for cough. Negative for chest tightness and shortness of breath.   Cardiovascular: Negative for chest pain.  Gastrointestinal: Negative for abdominal pain, diarrhea, nausea and vomiting.  Musculoskeletal: Negative for myalgias.  Skin: Negative for rash.  Neurological: Negative for dizziness, light-headedness and headaches.     Physical Exam Triage Vital Signs ED Triage Vitals  Enc  Vitals Group     BP 11/12/18 1744 (!) 142/87     Pulse Rate 11/12/18 1744 74     Resp 11/12/18 1744 18     Temp 11/12/18 1744 98.8 F (37.1 C)     Temp Source 11/12/18 1744 Oral     SpO2 11/12/18 1744 95 %     Weight 11/12/18 1754 (!) 303 lb (137.4 kg)     Height --      Head Circumference --      Peak Flow --      Pain Score 11/12/18 1754 5     Pain Loc --      Pain Edu? --      Excl. in GC? --    No data found.  Updated Vital Signs BP (!) 142/87 (BP Location: Left Arm)   Pulse 74   Temp 98.8 F (37.1 C) (Oral)   Resp 18   Wt (!) 303 lb (137.4 kg)   SpO2 95%   BMI 47.46 kg/m   Visual Acuity Right Eye Distance:   Left Eye Distance:   Bilateral Distance:    Right Eye Near:   Left Eye Near:    Bilateral Near:     Physical Exam Vitals signs and nursing note reviewed.  Constitutional:      General: She is not in acute distress.    Appearance: She is well-developed.  HENT:     Head: Normocephalic and atraumatic.     Ears:     Comments: Bilateral ears without tenderness to palpation of external auricle, tragus and mastoid, EAC's without erythema or swelling, TM's with good bony landmarks and cone of light. Non erythematous.    Nose:     Comments: Nasal mucosa pink, nonswollen turbinates    Mouth/Throat:     Comments: Oral mucosa pink and moist, no tonsillar enlargement or exudate. Posterior pharynx patent and nonerythematous, no uvula deviation or swelling. Normal phonation. Eyes:     Conjunctiva/sclera: Conjunctivae normal.  Neck:      Musculoskeletal: Neck supple.  Cardiovascular:     Rate and Rhythm: Normal rate and regular rhythm.     Heart sounds: No murmur.  Pulmonary:     Effort: Pulmonary effort is normal. No respiratory distress.     Breath sounds: Normal breath sounds.     Comments: Breathing comfortably at rest, CTABL, no wheezing, rales or other adventitious sounds auscultated; frequent cough in room, cough triggered by deep inspiration, Abdominal:     Palpations: Abdomen is soft.     Tenderness: There is no abdominal tenderness.  Skin:    General: Skin is warm and dry.  Neurological:     Mental Status: She is alert.      UC Treatments / Results  Labs (all labs ordered are listed, but only abnormal results are displayed) Labs Reviewed - No data to display  EKG None  Radiology Dg Chest 2 View  Result Date: 11/12/2018 CLINICAL DATA:  Dry cough and shortness of Breath EXAM: CHEST - 2 VIEW COMPARISON:  04/15/2017 CT, 04/17/2016 plain film FINDINGS: Cardiac shadows within normal limits. The lungs are mildly hyperaerated bilaterally. Mild scarring is noted bilaterally stable from previous exams. No bony abnormality is noted. 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 Electronically Signed   By: Alcide Clever M.D.   On: 11/12/2018 18:35    Procedures Procedures (including critical care time)  Medications Ordered in UC Medications - No data to display  Initial Impression / Assessment and Plan /  UC Course  I have reviewed the triage vital signs and the nursing notes.  Pertinent labs & imaging results that were available during my care of the patient were reviewed by me and considered in my medical decision making (see chart for details).     Chest x-ray suggestive of mild COPD, no signs of pneumonia.  Given length of symptoms will cover for atypical respiratory illness and will treat for bronchitis, will provide doxycycline, prednisone, Tessalon and Hycodan.  Tessalon for day, Hycodan for  nighttime.  Discussed drowsiness regarding Hycodan.  Checked drug interactions with Tikosyn and Eliquis without any interactions.Discussed strict return precautions. Patient verbalized understanding and is agreeable with plan.  Final Clinical Impressions(s) / UC Diagnoses   Final diagnoses:  Acute bronchitis, unspecified organism     Discharge Instructions     No pneumonia on chest x-ray I am treating you for bronchitis Begin doxycycline twice daily for 10 days Prednisone daily for 5 days, take with food in AM if you are able Tessalon for cough during day Hycodan for cough at nighttime- will cause drowsiness  Please follow-up if your symptoms are not resolving, developing worsening shortness of breath, difficulty breathing, chest discomfort, fevers    ED Prescriptions    Medication Sig Dispense Auth. Provider   predniSONE (DELTASONE) 50 MG tablet Take 1 tablet (50 mg total) by mouth daily for 5 days. 5 tablet ,  C, PA-C   benzonatate (TESSALON) 200 MG capsule Take 1 capsule (200 mg total) by mouth 3 (three) times daily as needed for up to 7 days for cough. During day 28 capsule ,  C, PA-C   HYDROcodone-homatropine (HYCODAN) 5-1.5 MG/5ML syrup Take 5 mLs by mouth at bedtime as needed for cough. 70 mL ,  C, PA-C   doxycycline (VIBRAMYCIN) 100 MG capsule Take 1 capsule (100 mg total) by mouth 2 (two) times daily for 10 days. 20 capsule ,  C, PA-C     Controlled Substance Prescriptions [place] Controlled Substance Registry consulted? Yes, I have consulted the [place] Controlled Substances Registry for this patient, and feel the risk/benefit ratio today is favorable for proceeding with this prescription for a controlled substance.   Lew Dawes,  C, [place] 11/12/18 1912

## 2018-11-12 NOTE — ED Triage Notes (Signed)
Pt cc coughing and congestion. Pt state she has been going threw a coughing attack. X 1 month or more.Regina Morales

## 2018-11-12 NOTE — Discharge Instructions (Signed)
No pneumonia on chest x-ray I am treating you for bronchitis Begin doxycycline twice daily for 10 days Prednisone daily for 5 days, take with food in AM if you are able Tessalon for cough during day Hycodan for cough at nighttime- will cause drowsiness  Please follow-up if your symptoms are not resolving, developing worsening shortness of breath, difficulty breathing, chest discomfort, fevers

## 2018-11-13 ENCOUNTER — Encounter: Payer: Managed Care, Other (non HMO) | Admitting: Obstetrics and Gynecology

## 2018-11-16 ENCOUNTER — Ambulatory Visit: Payer: Managed Care, Other (non HMO) | Admitting: Cardiology

## 2019-01-07 DIAGNOSIS — G5762 Lesion of plantar nerve, left lower limb: Secondary | ICD-10-CM | POA: Diagnosis not present

## 2019-01-07 DIAGNOSIS — M7751 Other enthesopathy of right foot: Secondary | ICD-10-CM | POA: Diagnosis not present

## 2019-01-07 DIAGNOSIS — M7752 Other enthesopathy of left foot: Secondary | ICD-10-CM | POA: Diagnosis not present

## 2019-01-07 DIAGNOSIS — G5761 Lesion of plantar nerve, right lower limb: Secondary | ICD-10-CM | POA: Diagnosis not present

## 2019-01-20 ENCOUNTER — Encounter: Payer: BLUE CROSS/BLUE SHIELD | Admitting: Podiatry

## 2019-01-26 NOTE — Progress Notes (Signed)
This encounter was created in error - please disregard.

## 2019-02-02 ENCOUNTER — Ambulatory Visit (HOSPITAL_COMMUNITY)
Admission: EM | Admit: 2019-02-02 | Discharge: 2019-02-02 | Disposition: A | Discharge status: Home / Self Care | Payer: BLUE CROSS/BLUE SHIELD | Attending: Family Medicine | Admitting: Family Medicine

## 2019-02-02 ENCOUNTER — Ambulatory Visit (INDEPENDENT_AMBULATORY_CARE_PROVIDER_SITE_OTHER): Payer: BLUE CROSS/BLUE SHIELD

## 2019-02-02 ENCOUNTER — Encounter (HOSPITAL_COMMUNITY): Payer: Self-pay | Admitting: Emergency Medicine

## 2019-02-02 DIAGNOSIS — M25551 Pain in right hip: Secondary | ICD-10-CM | POA: Diagnosis not present

## 2019-02-02 DIAGNOSIS — M25561 Pain in right knee: Secondary | ICD-10-CM

## 2019-02-02 DIAGNOSIS — M79672 Pain in left foot: Secondary | ICD-10-CM | POA: Diagnosis not present

## 2019-02-02 DIAGNOSIS — S8991XA Unspecified injury of right lower leg, initial encounter: Secondary | ICD-10-CM

## 2019-02-02 DIAGNOSIS — M25572 Pain in left ankle and joints of left foot: Secondary | ICD-10-CM

## 2019-02-02 DIAGNOSIS — W108XXA Fall (on) (from) other stairs and steps, initial encounter: Secondary | ICD-10-CM | POA: Diagnosis not present

## 2019-02-02 DIAGNOSIS — S99922A Unspecified injury of left foot, initial encounter: Secondary | ICD-10-CM | POA: Diagnosis not present

## 2019-02-02 DIAGNOSIS — I10 Essential (primary) hypertension: Secondary | ICD-10-CM

## 2019-02-02 NOTE — ED Triage Notes (Signed)
Pt states this morning she was walking own the stairs, her knee gave out and she fell down the stairs. C/o R knee pain, L foot pain.

## 2019-02-02 NOTE — ED Provider Notes (Signed)
[facility]    CSN: [id number] Arrival date & time: 02/02/19  1524     History   Chief Complaint No chief complaint on file.   HPI Regina Morales is a 58 y.o. female.   Patient is a 58 year old female that presents today for fall.  This occurred this morning.  She tripped while walking on the stairs.  She fell twisting the right knee and landing on the right knee.  She also injured the left foot at the same time.  Reports she landed in a very unusual position.  She denies hitting her head or any loss of consciousness.  She is also having some mild right hip pain.  She is able to ambulate.  Denies any numbness, tingling, paresthesias or loss of bowel or bladder function.  Denies any neck or back pain. She has not taken anything for the pain.   ROS per HPI      Past Medical History:  Diagnosis Date  . Anemia    hx years ago  . Buzzing in ear    "right; even after OR"  . Chest pain    a. 2009: neg MV  ([facility])  . Depression   . DVT (deep vein thrombosis) in pregnancy 1980's   LLE  . Fibromyalgia    "dx'd many many years ago; I haven't had any problems w/it" (04/15/2017)  . GERD (gastroesophageal reflux disease)   . Hepatic steatosis   . High cholesterol   . History of blood transfusion    "low count after appendix surgery" unsure # of units transfused  . Hypertension   . Migraines    "maybe once/2 months" (04/15/2017)  . Obesity   . OSA on CPAP   . Persistent atrial fibrillation    a. Dx 07/2013;  b. CHA2DS2VASc=1 (female);  c. 07/2013 Echo:  EF 50-55%, no rwma, mod LVH.  . Pulmonary embolism (HCC) ~ 1984   "after I had my last child"  . SVD (spontaneous vaginal delivery)    x 4    Patient Active Problem List   Diagnosis Date Noted  . Visit for monitoring Tikosyn therapy 09/15/2018  . 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 Tendinitis of right elbow 11/25/2017  . Abnormal nuclear stress test   . Essential hypertension   . Endometrial polyp   .  Persistent atrial fibrillation   . Normocytic anemia 04/24/2014  . Depression 04/22/2014  . Migraine 04/22/2014  . PAF (paroxysmal atrial fibrillation) (HCC)   . GERD (gastroesophageal reflux disease)   . Atypical chest pain   . Obesity     Past Surgical History:  Procedure Laterality Date  . APPENDECTOMY  1990's  . COLONOSCOPY    . DILATATION & CURETTAGE/HYSTEROSCOPY WITH MYOSURE N/A 04/14/2017   Procedure: DILATATION & CURETTAGE/HYSTEROSCOPY WITH MYOSURE;  Surgeon: Myna Hidalgo, DO;  Location: [facility];  Service: Gynecology;  Laterality: N/A;  Polypectomy  . DILATION AND CURETTAGE OF UTERUS  1983   mab  . INNER EAR SURGERY Right 2000's  . LEFT HEART CATH AND CORONARY ANGIOGRAPHY N/A 04/17/2017   Procedure: Left Heart Cath and Coronary Angiography;  Surgeon: Corky Crafts, MD;  Location: [facility] INVASIVE CV LAB;  Service: Cardiovascular;  Laterality: N/A;  . PLANTAR FASCIA RELEASE Right 07/2015  . TUBAL LIGATION  1985    OB History   No obstetric history on file.      Home Medications    Prior to Admission medications   Medication Sig Start Date End  Date Taking? Authorizing Provider  apixaban (ELIQUIS) 5 MG TABS tablet TAKE 1 TABLET(5 MG) BY MOUTH TWICE DAILY 10/05/18   Wendall Stade, MD  atorvastatin (LIPITOR) 20 MG tablet Take 1 tablet (20 mg total) by mouth daily at 6 PM. 04/18/17   Neva Seat, Tiffany, PA-C  buPROPion (WELLBUTRIN XL) 300 MG 24 hr tablet Take 300 mg by mouth at bedtime.     [provider]  diltiazem (CARDIZEM) 30 MG tablet Take 1 tablet every 4 hours AS NEEDED for AFIB heart rate >100 12/25/17   Newman Nip, NP  dofetilide (TIKOSYN) 500 MCG capsule Take 1 capsule (500 mcg total) by mouth 2 (two) times daily. 09/18/18   Sheilah Pigeon, PA-C  imipramine (TOFRANIL) 25 MG tablet Take 25 mg by mouth at bedtime.  01/14/16   [provider]  metoprolol tartrate (LOPRESSOR) 50 MG tablet Take 1 tablet (50 mg total) by mouth 2 (two) times  daily. 08/07/18 11/05/18  Manson Passey, PA  Multiple Vitamins-Minerals (MULTIVITAMIN WITH MINERALS) tablet Take 1 tablet by mouth daily. 50 + Vitamin    [provider]  ranitidine (ZANTAC) 300 MG tablet Take 300 mg by mouth at bedtime.    [provider]    Family History Family History  Problem Relation Age of Onset  . Lung cancer Father   . Alcohol abuse Father   . Kidney failure Mother        s/p renal Tx  . Congestive Heart Failure Mother   . Atrial fibrillation Brother   . Sudden Cardiac Death Daughter        cardiac arrest  . Cardiomyopathy Daughter   . Evelene Croon Parkinson White syndrome Grandchild         granddaughter    Social History Social History   Tobacco Use  . Smoking status: Former Smoker    Packs/day: 1.00    Years: 8.00    Pack years: 8.00    Types: Cigarettes    Last attempt to quit: 11/04/1990    Years since quitting: 28.2  . Smokeless tobacco: Never Used  Substance Use Topics  . Alcohol use: Not on file  . Drug use: No     Allergies   Codeine   Review of Systems Review of Systems   Physical Exam Triage Vital Signs ED Triage Vitals  Enc Vitals Group     BP 02/02/19 1536 (!) 163/92     Pulse Rate 02/02/19 1536 89     Resp 02/02/19 1536 16     Temp 02/02/19 1536 98.1 F (36.7 C)     Temp src --      SpO2 02/02/19 1536 100 %     Weight --      Height --      Head Circumference --      Peak Flow --      Pain Score 02/02/19 1537 8     Pain Loc --      Pain Edu? --      Excl. in GC? --    No data found.  Updated Vital Signs BP (!) 163/92   Pulse 89   Temp 98.1 F (36.7 C)   Resp 16   SpO2 100%   Visual Acuity Right Eye Distance:   Left Eye Distance:   Bilateral Distance:    Right Eye Near:   Left Eye Near:    Bilateral Near:     Physical Exam Vitals signs and nursing note reviewed.  Constitutional:  Appearance: Normal appearance.  HENT:     Head: Normocephalic and atraumatic.     Nose: Nose  normal.     Mouth/Throat:     Pharynx: Oropharynx is clear.  Eyes:     Conjunctiva/sclera: Conjunctivae normal.  Neck:     Musculoskeletal: Normal range of motion.  Pulmonary:     Effort: Pulmonary effort is normal.  Musculoskeletal:        General: Swelling, tenderness and signs of injury present. No deformity.     Comments: Pt able to ambulate in the room  TTP of the right hip/buttocks, no crepitus, bruising or deformity. Good ROM Tender to right knee, medial and lateral. Mild swelling. No bruising or deformity.more pain elicited with flexion of the knee. No laxity.  Mild tenderness to the left foot, mostly in the mid foot. No bruising, swelling, deformity. No ankle pain.    Skin:    General: Skin is warm and dry.  Neurological:     Mental Status: She is alert.  Psychiatric:        Mood and Affect: Mood normal.      UC Treatments / Results  Labs (all labs ordered are listed, but only abnormal results are displayed) Labs Reviewed - No data to display  EKG None  Radiology Dg Knee Complete 4 Views Right  Result Date: 02/02/2019 CLINICAL DATA:  Right knee pain after fall. EXAM: RIGHT KNEE - COMPLETE 4+ VIEW COMPARISON:  January 31, 2016. FINDINGS: No acute fracture or dislocation. No joint effusion. Unchanged mild medial and lateral compartment joint space narrowing. Bone mineralization is normal. Soft tissues are unremarkable. IMPRESSION: 1.  No acute osseous abnormality. Electronically Signed   By: Obie Dredge M.D.   On: 02/02/2019 16:17   Dg Foot Complete Left  Result Date: 02/02/2019 CLINICAL DATA:  Larey Seat down some steps today when RIGHT knee gave out, injury to RIGHT knee and LEFT foot post fall EXAM: LEFT FOOT - COMPLETE 3+ VIEW COMPARISON:  None FINDINGS: Low normal osseous demineralization. Joint spaces preserved. No acute fracture, dislocation, or bone destruction. IMPRESSION: No acute osseous abnormalities. Electronically Signed   By: Ulyses Southward M.D.   On:  02/02/2019 16:19    Procedures Procedures (including critical care time)  Medications Ordered in UC Medications - No data to display  Initial Impression / Assessment and Plan / UC Course  I have reviewed the triage vital signs and the nursing notes.  Pertinent labs & imaging results that were available during my care of the patient were reviewed by me and considered in my medical decision making (see chart for details).     X rays normal RICE Tylenol for pain.  Follow up as needed for continued or worsening symptoms  Final Clinical Impressions(s) / UC Diagnoses   Final diagnoses:  Fall (on) (from) other stairs and steps, initial encounter  Acute pain of right knee  Pain of right hip joint  Pain of joint of left ankle and foot     Discharge Instructions     Your x-rays were normal You can rest, ice, elevate Tylenol as needed for pain Follow up as needed for continued or worsening symptoms     ED Prescriptions    None     Controlled Substance Prescriptions [place] Controlled Substance Registry consulted? Not Applicable   Janace Aris, NP 02/03/19 [phone]

## 2019-02-02 NOTE — Discharge Instructions (Addendum)
Your x-rays were normal You can rest, ice, elevate Tylenol as needed for pain Follow up as needed for continued or worsening symptoms

## 2019-02-24 DIAGNOSIS — G5761 Lesion of plantar nerve, right lower limb: Secondary | ICD-10-CM | POA: Diagnosis not present

## 2019-02-24 DIAGNOSIS — M84375A Stress fracture, left foot, initial encounter for fracture: Secondary | ICD-10-CM | POA: Diagnosis not present

## 2019-02-24 DIAGNOSIS — G5762 Lesion of plantar nerve, left lower limb: Secondary | ICD-10-CM | POA: Diagnosis not present

## 2019-02-24 DIAGNOSIS — T148XXA Other injury of unspecified body region, initial encounter: Secondary | ICD-10-CM | POA: Diagnosis not present

## 2019-02-25 ENCOUNTER — Telehealth (HOSPITAL_COMMUNITY): Payer: Self-pay | Admitting: *Deleted

## 2019-02-25 NOTE — Telephone Encounter (Addendum)
Pt called in stating she fell down steps and fractured both of her feet. Saw orthopedics today they wrote prescription for meloxicam 7.5mg  once a day - pt wanting to make sure she can take this medication with her daily meds. Instructed pt I will review with Rudi Coco NP and be back in touch with her.

## 2019-02-26 DIAGNOSIS — M84375D Stress fracture, left foot, subsequent encounter for fracture with routine healing: Secondary | ICD-10-CM | POA: Diagnosis not present

## 2019-02-26 DIAGNOSIS — T148XXD Other injury of unspecified body region, subsequent encounter: Secondary | ICD-10-CM | POA: Diagnosis not present

## 2019-02-26 NOTE — Telephone Encounter (Signed)
Regina Nip, NP  Shona Simpson, RN  Caller: Unspecified [name], 4:47 PM)        using ths with elliquis will be her at risk of bleeding. She may be able to use a few days with food but I would not like her to use a lot. Tylenol would be ok. Ice/heat topical agents may be better long term solution.    Patient notified of above. Pt verbalized understanding. She will contact the orthopedic again as she already takes tylenol without benefit. Pt currently off zantac encouraged she take PPI while on meloxicam.

## 2019-03-01 DIAGNOSIS — M19071 Primary osteoarthritis, right ankle and foot: Secondary | ICD-10-CM | POA: Diagnosis not present

## 2019-03-01 DIAGNOSIS — M84375D Stress fracture, left foot, subsequent encounter for fracture with routine healing: Secondary | ICD-10-CM | POA: Diagnosis not present

## 2019-03-03 ENCOUNTER — Ambulatory Visit (INDEPENDENT_AMBULATORY_CARE_PROVIDER_SITE_OTHER): Payer: BLUE CROSS/BLUE SHIELD | Admitting: Podiatry

## 2019-03-03 ENCOUNTER — Ambulatory Visit (INDEPENDENT_AMBULATORY_CARE_PROVIDER_SITE_OTHER): Payer: BLUE CROSS/BLUE SHIELD

## 2019-03-03 ENCOUNTER — Other Ambulatory Visit: Payer: Self-pay

## 2019-03-03 VITALS — BP 177/99 | HR 79

## 2019-03-03 DIAGNOSIS — M7742 Metatarsalgia, left foot: Secondary | ICD-10-CM | POA: Diagnosis not present

## 2019-03-03 DIAGNOSIS — M79672 Pain in left foot: Secondary | ICD-10-CM

## 2019-03-03 DIAGNOSIS — G5762 Lesion of plantar nerve, left lower limb: Secondary | ICD-10-CM

## 2019-03-03 DIAGNOSIS — G5763 Lesion of plantar nerve, bilateral lower limbs: Secondary | ICD-10-CM | POA: Diagnosis not present

## 2019-03-03 DIAGNOSIS — G5792 Unspecified mononeuropathy of left lower limb: Secondary | ICD-10-CM | POA: Diagnosis not present

## 2019-03-03 DIAGNOSIS — G5791 Unspecified mononeuropathy of right lower limb: Secondary | ICD-10-CM | POA: Diagnosis not present

## 2019-03-03 DIAGNOSIS — M7741 Metatarsalgia, right foot: Secondary | ICD-10-CM | POA: Diagnosis not present

## 2019-03-03 DIAGNOSIS — M79671 Pain in right foot: Secondary | ICD-10-CM

## 2019-03-03 MED ORDER — GABAPENTIN 100 MG PO CAPS: 100.0000 mg | ORAL_CAPSULE | Freq: Three times a day (TID) | ORAL | 0 refills | Status: DC

## 2019-03-03 NOTE — Progress Notes (Signed)
DG  °

## 2019-03-03 NOTE — Progress Notes (Signed)
HPI: 58 year old female presents the office today as a new patient referral from Dr. Elijah Birk for evaluation of bilateral forefoot pain.  The patient has had persistent bilateral forefoot pain for several years now.  Patient denies injury.  Gradual onset.  She has been treated conservatively without any significant improvement of her symptoms.  She has tried shoe gear modifications, orthotics, anti-inflammatory injections without any significant alleviation of symptoms for the patient.  Patient is very limited with oral medication due to chronic A. fib that is currently being treated with Tikosyn, and antiarrhythmic oral medication.  She presents for further treatment evaluation  Past Medical History:  Diagnosis Date  . Anemia    hx years ago  . Buzzing in ear    "right; even after OR"  . Chest pain    a. 2009: neg MV  ([facility])  . Depression   . DVT (deep vein thrombosis) in pregnancy 1980's   LLE  . Fibromyalgia    "dx'd many many years ago; I haven't had any problems w/it" (04/15/2017)  . GERD (gastroesophageal reflux disease)   . Hepatic steatosis   . High cholesterol   . History of blood transfusion    "low count after appendix surgery" unsure # of units transfused  . Hypertension   . Migraines    "maybe once/2 months" (04/15/2017)  . Obesity   . OSA on CPAP   . Persistent atrial fibrillation    a. Dx 07/2013;  b. CHA2DS2VASc=1 (female);  c. 07/2013 Echo:  EF 50-55%, no rwma, mod LVH.  . Pulmonary embolism (HCC) ~ 1984   "after I had my last child"  . SVD (spontaneous vaginal delivery)    x 4     Physical Exam: General: The patient is alert and oriented x3 in no acute distress.  Dermatology: Skin is warm, dry and supple bilateral lower extremities. Negative for open lesions or macerations.  Vascular: Palpable pedal pulses bilaterally. No edema or erythema noted. Capillary refill within normal limits.  Neurological: Epicritic and protective threshold grossly intact bilaterally.    Musculoskeletal Exam: Range of motion within normal limits to all pedal and ankle joints bilateral. Muscle strength 5/5 in all groups bilateral.  Pain on palpation noted to the bilateral forefoot diffusely.  There is also exquisite pain on palpation to the second interspace of the left foot as well as the second and third interspace of the right foot possibly consistent with Morton's neuroma.  Radiographic Exam:  Normal osseous mineralization. Joint spaces preserved. No fracture/dislocation/boney destruction, with exception of a small avulsion type fracture of the base of the proximal phalanx right great toe medial aspect best visualized on medial oblique view.  Assessment: 1.  Morton's metatarsalgia both feet 2.  Neuritis of both feet   Plan of Care:  1. Patient evaluated. X-Rays reviewed.  2.  Prescription for gabapentin 100 mg 3 times daily to see if this improves the patient's symptoms 3.  Also today we are going to order an MRI bilateral foot.  Medically necessary.  Patient has had symptoms for several years and despite multiple conservative modalities there is been no improvement in her symptoms.  Pain is constant at a 8/10 on a daily basis and keeps her up at night. 4.  Return to clinic in 3-4 weeks to review MRI results and to see if the gabapentin helped improve the patient's symptoms      Felecia Shelling, DPM [facility]  Dr. Felecia Shelling, DPM  [address], [place] [address]                Office [phone]  Fax [phone]

## 2019-03-04 ENCOUNTER — Telehealth: Payer: Self-pay | Admitting: *Deleted

## 2019-03-04 ENCOUNTER — Other Ambulatory Visit: Payer: Self-pay | Admitting: Podiatry

## 2019-03-04 DIAGNOSIS — G5792 Unspecified mononeuropathy of left lower limb: Secondary | ICD-10-CM

## 2019-03-04 DIAGNOSIS — M7741 Metatarsalgia, right foot: Secondary | ICD-10-CM

## 2019-03-04 DIAGNOSIS — M7742 Metatarsalgia, left foot: Secondary | ICD-10-CM

## 2019-03-04 DIAGNOSIS — G5762 Lesion of plantar nerve, left lower limb: Secondary | ICD-10-CM

## 2019-03-04 DIAGNOSIS — G5763 Lesion of plantar nerve, bilateral lower limbs: Secondary | ICD-10-CM

## 2019-03-04 DIAGNOSIS — G5791 Unspecified mononeuropathy of right lower limb: Secondary | ICD-10-CM

## 2019-03-04 NOTE — Telephone Encounter (Signed)
-----   Message from Felecia Shelling, DPM sent at 03/03/2019  4:53 PM EDT ----- Regarding: MRI b/l forefoot Please order MRI bilateral forefoot without contrast  Diagnosis: Severe bilateral forefoot pain.  Metatarsalgia bilateral.  Morton's neuroma bilateral.  Thanks, Dr. Logan Bores

## 2019-03-04 NOTE — Telephone Encounter (Signed)
Orders to J. Quintana, RN for pre-cert, and faxed to [facility]. 

## 2019-03-08 DIAGNOSIS — M84375D Stress fracture, left foot, subsequent encounter for fracture with routine healing: Secondary | ICD-10-CM | POA: Diagnosis not present

## 2019-03-08 DIAGNOSIS — T148XXA Other injury of unspecified body region, initial encounter: Secondary | ICD-10-CM | POA: Diagnosis not present

## 2019-03-17 ENCOUNTER — Other Ambulatory Visit: Payer: Self-pay

## 2019-03-17 ENCOUNTER — Ambulatory Visit
Admission: RE | Admit: 2019-03-17 | Discharge: 2019-03-17 | Disposition: A | Discharge status: Home / Self Care | Payer: BLUE CROSS/BLUE SHIELD | Source: Ambulatory Visit | Attending: Podiatry | Admitting: Podiatry

## 2019-03-17 DIAGNOSIS — G5763 Lesion of plantar nerve, bilateral lower limbs: Secondary | ICD-10-CM

## 2019-03-17 DIAGNOSIS — G5791 Unspecified mononeuropathy of right lower limb: Secondary | ICD-10-CM | POA: Diagnosis not present

## 2019-03-17 DIAGNOSIS — G5792 Unspecified mononeuropathy of left lower limb: Secondary | ICD-10-CM

## 2019-03-17 MED ORDER — GADOBENATE DIMEGLUMINE 529 MG/ML IV SOLN
20.0000 mL | Freq: Once | INTRAVENOUS | Status: AC | PRN
  Administered 2019-03-17: 20 mL via INTRAVENOUS

## 2019-03-22 ENCOUNTER — Emergency Department (HOSPITAL_COMMUNITY): Payer: BLUE CROSS/BLUE SHIELD

## 2019-03-22 ENCOUNTER — Emergency Department (HOSPITAL_COMMUNITY)
Admission: EM | Admit: 2019-03-22 | Discharge: 2019-03-23 | Disposition: A | Discharge status: Home / Self Care | Payer: BLUE CROSS/BLUE SHIELD | Attending: Emergency Medicine | Admitting: Emergency Medicine

## 2019-03-22 ENCOUNTER — Encounter (HOSPITAL_COMMUNITY): Payer: Self-pay

## 2019-03-22 ENCOUNTER — Other Ambulatory Visit: Payer: Self-pay

## 2019-03-22 DIAGNOSIS — R600 Localized edema: Secondary | ICD-10-CM | POA: Diagnosis not present

## 2019-03-22 DIAGNOSIS — Z7901 Long term (current) use of anticoagulants: Secondary | ICD-10-CM | POA: Insufficient documentation

## 2019-03-22 DIAGNOSIS — I4811 Longstanding persistent atrial fibrillation: Secondary | ICD-10-CM | POA: Insufficient documentation

## 2019-03-22 DIAGNOSIS — R609 Edema, unspecified: Secondary | ICD-10-CM | POA: Diagnosis not present

## 2019-03-22 DIAGNOSIS — R079 Chest pain, unspecified: Secondary | ICD-10-CM | POA: Diagnosis not present

## 2019-03-22 DIAGNOSIS — R0602 Shortness of breath: Secondary | ICD-10-CM | POA: Diagnosis not present

## 2019-03-22 DIAGNOSIS — I10 Essential (primary) hypertension: Secondary | ICD-10-CM | POA: Insufficient documentation

## 2019-03-22 DIAGNOSIS — Z79899 Other long term (current) drug therapy: Secondary | ICD-10-CM | POA: Insufficient documentation

## 2019-03-22 DIAGNOSIS — Z87891 Personal history of nicotine dependence: Secondary | ICD-10-CM | POA: Diagnosis not present

## 2019-03-22 DIAGNOSIS — R0789 Other chest pain: Secondary | ICD-10-CM | POA: Diagnosis not present

## 2019-03-22 LAB — BASIC METABOLIC PANEL
Anion gap: 12 (ref 5–15)
BUN: 13 mg/dL (ref 6–20)
CO2: 24 mmol/L (ref 22–32)
Calcium: 9.6 mg/dL (ref 8.9–10.3)
Chloride: 105 mmol/L (ref 98–111)
Creatinine, Ser: 0.7 mg/dL (ref 0.44–1.00)
GFR calc Af Amer: >60 mL/min (ref >60)
GFR calc non Af Amer: >60 mL/min (ref >60)
Glucose, Bld: 94 mg/dL (ref 70–99)
Potassium: 3.7 mmol/L (ref 3.5–5.1)
Sodium: 141 mmol/L (ref 135–145)

## 2019-03-22 LAB — TROPONIN I: Troponin I: <0.03 ng/mL (ref <0.03)

## 2019-03-22 LAB — I-STAT BETA HCG BLOOD, ED (MC, WL, AP ONLY): I-stat hCG, quantitative: <5 m[IU]/mL (ref <5)

## 2019-03-22 LAB — CBC
HCT: 37.5 % (ref 36.0–46.0)
Hemoglobin: 11.9 g/dL — ABNORMAL LOW (ref 12.0–15.0)
MCH: 29 pg (ref 26.0–34.0)
MCHC: 31.7 g/dL (ref 30.0–36.0)
MCV: 91.2 fL (ref 80.0–100.0)
Platelets: 308 10*3/uL (ref 150–400)
RBC: 4.11 MIL/uL (ref 3.87–5.11)
RDW: 12.7 % (ref 11.5–15.5)
WBC: 9.9 10*3/uL (ref 4.0–10.5)
nRBC: 0 % (ref 0.0–0.2)

## 2019-03-22 LAB — PROTIME-INR
INR: 1 (ref 0.8–1.2)
Prothrombin Time: 13.3 seconds (ref 11.4–15.2)

## 2019-03-22 LAB — BRAIN NATRIURETIC PEPTIDE: B Natriuretic Peptide: 56 pg/mL (ref 0.0–100.0)

## 2019-03-22 NOTE — ED Triage Notes (Signed)
Patient c/o CP that started today, but bilateral leg swelling and SOB two days ago. Hx of Afib and taking Eloquis.

## 2019-03-22 NOTE — ED Provider Notes (Signed)
[facility] EMERGENCY DEPARTMENT Provider Note   CSN: [id number] Arrival date & time: 03/22/19  1930    History   Chief Complaint Chief Complaint  Patient presents with   Chest Pain    HPI Regina Morales is a 58 y.o. female.   The history is provided by the patient.  She has history of hypertension, hyperlipidemia, atrial fibrillation, pulmonary embolism and comes in because of leg swelling over the last several days.  She also had some left lateral chest pain which is been present since mid afternoon.  Pain is a dull ache which is constant and not affected by any movement.  She does have some chest pain when her heart goes into atrial fibrillation, but this is different.  There is no associated dyspnea, nausea, diaphoresis.  Of note, she is anticoagulated on apixaban.  Also, she had a recent fracture of her left ankle.  Past Medical History:  Diagnosis Date   Anemia    hx years ago   Buzzing in ear    "right; even after OR"   Chest pain    a. 2009: neg MV  ([facility])   Depression    DVT (deep vein thrombosis) in pregnancy 1980's   LLE   Fibromyalgia    "dx'd many many years ago; I haven't had any problems w/it" (04/15/2017)   GERD (gastroesophageal reflux disease)    Hepatic steatosis    High cholesterol    History of blood transfusion    "low count after appendix surgery" unsure # of units transfused   Hypertension    Migraines    "maybe once/2 months" (04/15/2017)   Obesity    OSA on CPAP    Persistent atrial fibrillation    a. Dx 07/2013;  b. CHA2DS2VASc=1 (female);  c. 07/2013 Echo:  EF 50-55%, no rwma, mod LVH.   Pulmonary embolism (HCC) ~ 1984   "after I had my last child"   SVD (spontaneous vaginal delivery)    x 4    Patient Active Problem List   Diagnosis Date Noted   Visit for monitoring Tikosyn therapy 09/15/2018   Ulnar nerve neuropathy 12/08/2017   Tendinitis of right elbow 11/25/2017   Abnormal nuclear stress  test    Essential hypertension    Endometrial polyp    Persistent atrial fibrillation    Normocytic anemia 04/24/2014   Depression 04/22/2014   Migraine 04/22/2014   PAF (paroxysmal atrial fibrillation) (HCC)    GERD (gastroesophageal reflux disease)    Atypical chest pain    Obesity     Past Surgical History:  Procedure Laterality Date   APPENDECTOMY  1990's   COLONOSCOPY     DILATATION & CURETTAGE/HYSTEROSCOPY WITH MYOSURE N/A 04/14/2017   Procedure: DILATATION & CURETTAGE/HYSTEROSCOPY WITH MYOSURE;  Surgeon: Myna Hidalgo, DO;  Location: [facility];  Service: Gynecology;  Laterality: N/A;  Polypectomy   DILATION AND CURETTAGE OF UTERUS  1983   mab   INNER EAR SURGERY Right 2000's   LEFT HEART CATH AND CORONARY ANGIOGRAPHY N/A 04/17/2017   Procedure: Left Heart Cath and Coronary Angiography;  Surgeon: Corky Crafts, MD;  Location: [facility] INVASIVE CV LAB;  Service: Cardiovascular;  Laterality: N/A;   PLANTAR FASCIA RELEASE Right 07/2015   TUBAL LIGATION  1985     OB History   No obstetric history on file.      Home Medications    Prior to Admission medications   Medication Sig Start Date End Date Taking? Authorizing Provider  ALPRAZolam (XANAX) 0.25 MG tablet TK 1 T PO  BID PRN 02/26/19   [provider]  apixaban (ELIQUIS) 5 MG TABS tablet TAKE 1 TABLET(5 MG) BY MOUTH TWICE DAILY 10/05/18   Wendall Stade, MD  atorvastatin (LIPITOR) 20 MG tablet Take 1 tablet (20 mg total) by mouth daily at 6 PM. 04/18/17   Neva Seat, Tiffany, PA-C  buPROPion (WELLBUTRIN XL) 300 MG 24 hr tablet Take 300 mg by mouth at bedtime.     [provider]  diltiazem (CARDIZEM) 30 MG tablet Take 1 tablet every 4 hours AS NEEDED for AFIB heart rate >100 12/25/17   Newman Nip, NP  dofetilide (TIKOSYN) 500 MCG capsule Take 1 capsule (500 mcg total) by mouth 2 (two) times daily. 09/18/18   Sheilah Pigeon, PA-C  gabapentin (NEURONTIN) 100 MG capsule Take 1 capsule  (100 mg total) by mouth 3 (three) times daily. 03/03/19   Felecia Shelling, DPM  imipramine (TOFRANIL) 25 MG tablet Take 25 mg by mouth at bedtime.  01/14/16   [provider]  meloxicam (MOBIC) 7.5 MG tablet TK 1 T PO QD 02/25/19   [provider]  metoprolol tartrate (LOPRESSOR) 50 MG tablet Take 1 tablet (50 mg total) by mouth 2 (two) times daily. 08/07/18 11/05/18  Manson Passey, PA  Multiple Vitamins-Minerals (MULTIVITAMIN WITH MINERALS) tablet Take 1 tablet by mouth daily. 50 + Vitamin    [provider]  ranitidine (ZANTAC) 300 MG tablet Take 300 mg by mouth at bedtime.    [provider]    Family History Family History  Problem Relation Age of Onset   Lung cancer Father    Alcohol abuse Father    Kidney failure Mother        s/p renal Tx   Congestive Heart Failure Mother    Atrial fibrillation Brother    Sudden Cardiac Death Daughter        cardiac arrest   Cardiomyopathy Daughter    Evelene Croon Parkinson White syndrome Grandchild         granddaughter    Social History Social History   Tobacco Use   Smoking status: Former Smoker    Packs/day: 1.00    Years: 8.00    Pack years: 8.00    Types: Cigarettes    Last attempt to quit: 11/04/1990    Years since quitting: 28.3   Smokeless tobacco: Never Used  Substance Use Topics   Alcohol use: Not on file   Drug use: No     Allergies   Codeine   Review of Systems Review of Systems  All other systems reviewed and are negative.    Physical Exam Updated Vital Signs BP (!) 162/92    Pulse 80    Temp 98.2 F (36.8 C) (Oral)    Resp 18    Ht 5\' 7"  (1.702 m)    Wt (!) 145.2 kg    SpO2 100%    BMI 50.12 kg/m   Physical Exam Vitals signs and nursing note reviewed.    Morbidly obese 58 year old female, resting comfortably and in no acute distress. Vital signs are significant for elevated blood pressure. Oxygen saturation is 100%, which is normal. Head is normocephalic and  atraumatic. PERRLA, EOMI. Oropharynx is clear. Neck is nontender and supple without adenopathy or JVD. Back is nontender and there is no CVA tenderness. Lungs are clear without rales, wheezes, or rhonchi. Chest is nontender. Heart has regular rate and rhythm without murmur.  Abdomen is soft, flat, nontender without masses or hepatosplenomegaly and peristalsis is normoactive. Extremities have 2-3+ edema, full range of motion is present.  Cam walker present on the left. Skin is warm and dry without rash. Neurologic: Mental status is normal, cranial nerves are intact, there are no motor or sensory deficits.  ED Treatments / Results  Labs (all labs ordered are listed, but only abnormal results are displayed) Labs Reviewed  CBC - Abnormal; Notable for the following components:      Result Value   Hemoglobin 11.9 (*)    All other components within normal limits  BASIC METABOLIC PANEL  TROPONIN I  PROTIME-INR  BRAIN NATRIURETIC PEPTIDE  TROPONIN I  I-STAT BETA HCG BLOOD, ED ([facility], [facility], [facility] ONLY)    EKG Sinus rhythm 72 bpm.  Normal axis.  Normal intervals.  No ST or T changes.  When compared with ECG of October 16, 2018, no significant changes are seen.  Radiology Dg Chest 2 View  Result Date: 03/22/2019 CLINICAL DATA:  Chest pain with shortness of breath EXAM: CHEST - 2 VIEW COMPARISON:  11/12/2018 FINDINGS: There is no pneumothorax. No large pleural effusion. Scarring in atelectasis is noted at the lung bases. The lungs are mildly hyperexpanded. The cardiac silhouette is stable. IMPRESSION: 1. No acute cardiopulmonary process identified. 2. Scattered airspace opacities bilaterally favored to represent pleuroparenchymal scarring or atelectasis. There is an increasing opacity overlying the sixth rib posteriorly. A 4-6 week follow-up two-view chest x-ray is recommended to confirm resolution of this finding. Electronically Signed   By: Katherine Mantlehristopher  Green M.D.   On: 03/22/2019 22:53     Procedures Procedures   Medications Ordered in ED Medications - No data to display   Initial Impression / Assessment and Plan / ED Course  I have reviewed the triage vital signs and the nursing notes.  Pertinent labs & imaging results that were available during my care of the patient were reviewed by me and considered in my medical decision making (see chart for details).  Peripheral edema.  Atypical chest pain.  Troponin is normal.  Chest x-ray does show a density over the sixth rib, recommendation repeat PA and lateral chest x-ray in 4-6 weeks.  Will check delta troponin.  Doubt pulmonary embolism with patient anticoagulated on apixaban.  Pneumonia effectively ruled out by chest x-ray.  Doubt aortic dissection.  Review of past records confirms outpatient management of atrial fibrillation.  Also, CT angiogram of chest as recently as June 2018 showed no evidence of aortic aneurysm.  Repeat troponin is normal.  She is sent home with prescription for furosemide and advised close consultation with her primary care provider and cardiologist regarding how long to continue taking furosemide.  She is advised to have her chest x-ray repeated per radiologist recommendation.  Final Clinical Impressions(s) / ED Diagnoses   Final diagnoses:  Peripheral edema  Atypical chest pain    ED Discharge Orders         Ordered    furosemide (LASIX) 40 MG tablet  Daily     03/23/19 0104           Dione BoozeGlick, Aislinn Feliz, MD 03/23/19 [phone]

## 2019-03-23 DIAGNOSIS — M84375D Stress fracture, left foot, subsequent encounter for fracture with routine healing: Secondary | ICD-10-CM | POA: Diagnosis not present

## 2019-03-23 DIAGNOSIS — T148XXA Other injury of unspecified body region, initial encounter: Secondary | ICD-10-CM | POA: Diagnosis not present

## 2019-03-23 LAB — TROPONIN I: Troponin I: <0.03 ng/mL (ref <0.03)

## 2019-03-23 MED ORDER — FUROSEMIDE 40 MG PO TABS: 40.0000 mg | ORAL_TABLET | Freq: Every day | ORAL | 0 refills | Status: DC

## 2019-03-23 NOTE — Discharge Instructions (Addendum)
Please take the furosemide (Lasix) once a day.  Monitor your weight every day.  At the end of this week, talk with your primary care provider or your cardiologist for directions as to whether to continue taking the furosemide.  The radiologist saw a spot that he was concerned about, and suggests you get a two view chest x-ray in 4-6 weeks.

## 2019-03-24 ENCOUNTER — Other Ambulatory Visit (HOSPITAL_COMMUNITY): Payer: Self-pay | Admitting: *Deleted

## 2019-03-24 ENCOUNTER — Other Ambulatory Visit: Payer: Self-pay

## 2019-03-24 ENCOUNTER — Ambulatory Visit (INDEPENDENT_AMBULATORY_CARE_PROVIDER_SITE_OTHER): Payer: BLUE CROSS/BLUE SHIELD | Admitting: Podiatry

## 2019-03-24 ENCOUNTER — Encounter: Payer: Self-pay | Admitting: Podiatry

## 2019-03-24 VITALS — Temp 97.5°F

## 2019-03-24 DIAGNOSIS — M109 Gout, unspecified: Secondary | ICD-10-CM

## 2019-03-24 DIAGNOSIS — M7751 Other enthesopathy of right foot: Secondary | ICD-10-CM | POA: Diagnosis not present

## 2019-03-24 DIAGNOSIS — G5763 Lesion of plantar nerve, bilateral lower limbs: Secondary | ICD-10-CM

## 2019-03-24 DIAGNOSIS — M7752 Other enthesopathy of left foot: Secondary | ICD-10-CM

## 2019-03-24 DIAGNOSIS — G5791 Unspecified mononeuropathy of right lower limb: Secondary | ICD-10-CM

## 2019-03-24 MED ORDER — POTASSIUM CHLORIDE CRYS ER 20 MEQ PO TBCR: 20.0000 meq | EXTENDED_RELEASE_TABLET | Freq: Every day | ORAL | 3 refills | Status: DC

## 2019-03-24 NOTE — Progress Notes (Signed)
ar

## 2019-03-25 ENCOUNTER — Telehealth (HOSPITAL_COMMUNITY): Payer: Self-pay | Admitting: *Deleted

## 2019-03-25 DIAGNOSIS — G3184 Mild cognitive impairment, so stated: Secondary | ICD-10-CM | POA: Diagnosis not present

## 2019-03-25 DIAGNOSIS — E559 Vitamin D deficiency, unspecified: Secondary | ICD-10-CM | POA: Diagnosis not present

## 2019-03-25 DIAGNOSIS — R634 Abnormal weight loss: Secondary | ICD-10-CM | POA: Diagnosis not present

## 2019-03-25 DIAGNOSIS — M5417 Radiculopathy, lumbosacral region: Secondary | ICD-10-CM | POA: Diagnosis not present

## 2019-03-25 DIAGNOSIS — G609 Hereditary and idiopathic neuropathy, unspecified: Secondary | ICD-10-CM | POA: Diagnosis not present

## 2019-03-25 DIAGNOSIS — G603 Idiopathic progressive neuropathy: Secondary | ICD-10-CM | POA: Diagnosis not present

## 2019-03-25 DIAGNOSIS — M109 Gout, unspecified: Secondary | ICD-10-CM | POA: Diagnosis not present

## 2019-03-25 DIAGNOSIS — R413 Other amnesia: Secondary | ICD-10-CM | POA: Diagnosis not present

## 2019-03-25 DIAGNOSIS — G43909 Migraine, unspecified, not intractable, without status migrainosus: Secondary | ICD-10-CM | POA: Diagnosis not present

## 2019-03-25 DIAGNOSIS — G5603 Carpal tunnel syndrome, bilateral upper limbs: Secondary | ICD-10-CM | POA: Diagnosis not present

## 2019-03-25 NOTE — Telephone Encounter (Signed)
Ok to use both of these medications with Tikosyn.

## 2019-03-25 NOTE — Telephone Encounter (Signed)
Patient notified

## 2019-03-25 NOTE — Telephone Encounter (Signed)
Patient wanted to check on 2 new medications her neurologist wants to prescribe to ensure no interaction with tikosyn. 1) Trokendi XR she will start with 25mg  and increase eventually to 100mg  a day  2) Maxalt PRN for migraines.  Please review/advise.

## 2019-03-26 LAB — ANA, IFA COMPREHENSIVE PANEL
Anti Nuclear Antibody (ANA): NEGATIVE
ENA SM Ab Ser-aCnc: <1 AI
SM/RNP: <1 AI
SSA (Ro) (ENA) Antibody, IgG: <1 AI
SSB (La) (ENA) Antibody, IgG: <1 AI
Scleroderma (Scl-70) (ENA) Antibody, IgG: <1 AI
ds DNA Ab: <1 IU/mL

## 2019-03-26 LAB — C-REACTIVE PROTEIN: CRP: 9.2 mg/L — ABNORMAL HIGH (ref <8.0)

## 2019-03-26 LAB — SEDIMENTATION RATE: Sed Rate: 19 mm/h (ref 0–30)

## 2019-03-26 LAB — RHEUMATOID FACTOR: Rheumatoid fact SerPl-aCnc: <14 IU/mL (ref <14)

## 2019-03-26 LAB — URIC ACID: Uric Acid, Serum: 5.1 mg/dL (ref 2.5–7.0)

## 2019-03-29 NOTE — Progress Notes (Signed)
HPI: 58 year old female presents the office today for follow up evaluation of bilateral foot pain. She states her pain is unchanged. She notes edema that caused her to go the ED two days ago. There are no modifying factors noted. She has been using her custom orthotics as directed. Patient is here for further evaluation and treatment.   Past Medical History:  Diagnosis Date   Anemia    hx years ago   Buzzing in ear    "right; even after OR"   Chest pain    a. 2009: neg MV  ([facility])   Depression    DVT (deep vein thrombosis) in pregnancy 1980's   LLE   Fibromyalgia    "dx'd many many years ago; I haven't had any problems w/it" (04/15/2017)   GERD (gastroesophageal reflux disease)    Hepatic steatosis    High cholesterol    History of blood transfusion    "low count after appendix surgery" unsure # of units transfused   Hypertension    Migraines    "maybe once/2 months" (04/15/2017)   Obesity    OSA on CPAP    Persistent atrial fibrillation    a. Dx 07/2013;  b. CHA2DS2VASc=1 (female);  c. 07/2013 Echo:  EF 50-55%, no rwma, mod LVH.   Pulmonary embolism (HCC) ~ 1984   "after I had my last child"   SVD (spontaneous vaginal delivery)    x 4     Physical Exam: General: The patient is alert and oriented x3 in no acute distress.  Dermatology: Skin is warm, dry and supple bilateral lower extremities. Negative for open lesions or macerations.  Vascular: Palpable pedal pulses bilaterally. No edema or erythema noted. Capillary refill within normal limits.  Neurological: Epicritic and protective threshold grossly intact bilaterally.   Musculoskeletal Exam: Range of motion within normal limits to all pedal and ankle joints bilateral. Muscle strength 5/5 in all groups bilateral.  Pain on palpation to the second interspace of the left foot as well as the second and third interspace of the right foot possibly consistent with Morton's neuroma. Pain with palpation noted to  the 2nd MPJ of the bilateral feet.   MRI Impression:  Right foot:  1. Possible gout arthropathy with medial irregularity and erosion of first metatarsal head. There is also some low-level marginal edema medially along both sides of the second MTP joint, and in the base of the proximal phalanx of the great toe, which could also be due to arthropathy such as rheumatoid arthropathy. 2. Mild hallux valgus. 3. Low-grade edema in the interosseous muscles between the first and second and second and third metatarsals, potentially from low-grade myositis. 4. Subtle edema in the abductor hallucis muscle, likewise neurogenic or from mild myositis. 5. Dorsal subcutaneous edema in the foot.  Left foot:  1. Suspected small erosions along the lateral heads of the fourth and fifth metatarsals with associated low-grade marrow edema. 2. Mild intermetatarsal bursitis between the heads of the first and second, and third and fourth metatarsals. 3. Low-grade edema in the interosseous muscles between the first, second, and third metatarsals, low-grade myositis not excluded although this could be neurogenic.   Assessment: 1. Morton's metatarsalgia both feet 2. 2nd MPJ capsulitis bilaterally   Plan of Care:  1. Patient evaluated. MRIs reviewed.  2.  Arthritic panel ordered. Will call patient with results.  3. Injection of 0.5 mLs Celestone Soluspan injected into the 2nd MPJ of the bilateral feet.  4. Continue using custom orthotics.  5. Patient may require temporary FMLA or short term disability in the future.  6. Return to clinic as needed.   Does tech support for work.     Felecia Shelling, DPM [facility]  Dr. Felecia Shelling, DPM    [address] [place], [place] [address]                Office [phone]  Fax [phone]

## 2019-03-30 ENCOUNTER — Telehealth: Payer: Self-pay | Admitting: Cardiology

## 2019-03-30 NOTE — Telephone Encounter (Signed)
New Message:    Patient states some one called her concerning some medications. I did not see a note.

## 2019-03-30 NOTE — Telephone Encounter (Signed)
Af clinic did not call patient today.

## 2019-03-30 NOTE — Telephone Encounter (Signed)
Pt has appt with Dr. Elberta Fortis 5/29

## 2019-03-30 NOTE — Telephone Encounter (Signed)
Spoke to pt. She has stopped her Lasix & K+, legs have improved.  She will wait for visit Friday, w/ Camnitz, to determine if she needs to restart temporarily. Pt with no complaints today.

## 2019-03-31 ENCOUNTER — Ambulatory Visit (INDEPENDENT_AMBULATORY_CARE_PROVIDER_SITE_OTHER): Payer: BLUE CROSS/BLUE SHIELD | Admitting: Physician Assistant

## 2019-03-31 ENCOUNTER — Encounter: Payer: Self-pay | Admitting: Physician Assistant

## 2019-03-31 ENCOUNTER — Other Ambulatory Visit: Payer: Self-pay

## 2019-03-31 ENCOUNTER — Ambulatory Visit: Payer: BLUE CROSS/BLUE SHIELD | Admitting: Podiatry

## 2019-03-31 VITALS — BP 155/91 | HR 81 | Temp 98.3°F | Resp 16 | Wt 326.0 lb

## 2019-03-31 DIAGNOSIS — G43709 Chronic migraine without aura, not intractable, without status migrainosus: Secondary | ICD-10-CM

## 2019-03-31 DIAGNOSIS — I10 Essential (primary) hypertension: Secondary | ICD-10-CM

## 2019-03-31 DIAGNOSIS — I480 Paroxysmal atrial fibrillation: Secondary | ICD-10-CM | POA: Diagnosis not present

## 2019-03-31 DIAGNOSIS — K219 Gastro-esophageal reflux disease without esophagitis: Secondary | ICD-10-CM | POA: Diagnosis not present

## 2019-03-31 DIAGNOSIS — F325 Major depressive disorder, single episode, in full remission: Secondary | ICD-10-CM | POA: Diagnosis not present

## 2019-03-31 DIAGNOSIS — G6289 Other specified polyneuropathies: Secondary | ICD-10-CM

## 2019-03-31 MED ORDER — PANTOPRAZOLE SODIUM 40 MG PO TBEC: 40.0000 mg | DELAYED_RELEASE_TABLET | Freq: Every day | ORAL | 3 refills | Status: DC

## 2019-03-31 NOTE — Patient Instructions (Addendum)
Instructions sent to MyChart.   It was very nice meeting you today! I look forward to working with you in managing your health!  I have sent in a prescription for the Pantoprazole for you to take daily for 2 weeks for the reflux. Start a daily probiotic like Align or Digestive advantage to help with stools.  Follow-up with me in a couple of weeks to let me know how things are going.  Also keep me updated on what the specialists are doing for you so that I can make sure nothing further is needed on our end.  Once all this craziness with COVID calms down, we will get you in for a check up.

## 2019-03-31 NOTE — Progress Notes (Signed)
Virtual Visit via Video   I connected with patient on 03/31/19 at  9:00 AM EDT by a video enabled telemedicine application and verified that I am speaking with the correct person using two identifiers.  Location patient: Home Location provider: [facility], Office Persons participating in the virtual visit: Patient, Provider, CMA (Patina Moore)  I discussed the limitations of evaluation and management by telemedicine and the availability of in person appointments. The patient expressed understanding and agreed to proceed.  Subjective:   HPI:   Patient presents today via Doxy.Me to establish care. Denies any true acute concerns today for us to address but has been seeing specialist for several chronic and more acute issues recently. Patient has been sen by both Podiatry and Neurology recently and diagnosed with peripheral neuropathy. States she recently had a nerve conduction study with Neurology to further assess the potential neuropathy, as well as lab work to assess for common causes. Has not received full results yet. Notes podiatry is getting and MRI of her bilateral feet to further assess significant pain in these areas.  Hypertension: - Patient is currently on a regimen of Diltiazem 30 mg QD and Lopressor 50 mg BID. Endorses taking medications daily as directed. Patient denies chest pain, palpitations, lightheadedness, dizziness, vision changes or frequent headaches.  PAF: - Followed by Cardiology, Dr. Elberta Fortisamnitz. Is currently on a regimen of Tikosyn 500 mcg BID, Eliquis 5 mg BID. Denies bleeding or bruising. Notes she is asymptomatic at present.   Migraines: - On Trokendi XR 50 mg daily, recently started by Neurology. Also on Maxalt for abortive therapy. Notes improvement in headaches.  GERD: - is on longstanding Protonix daily. Notes resolution of symptoms without breakthrough as long as she takes her medication.  Depression: - Patient not currently on medication for  this. Notes mood stable overall. Feels the Trokendi does help with this as well. Denies SI/HI.  ROS:   See pertinent positives and negatives per HPI.  Patient Active Problem List   Diagnosis Date Noted  . Visit for monitoring Tikosyn therapy 09/15/2018  . 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 Tendinitis of right elbow 11/25/2017  . Abnormal nuclear stress test   . Essential hypertension   . Endometrial polyp   . Persistent atrial fibrillation   . Normocytic anemia 04/24/2014  . Depression 04/22/2014  . Migraine 04/22/2014  . PAF (paroxysmal atrial fibrillation) (HCC)   . GERD (gastroesophageal reflux disease)   . Atypical chest pain   . Obesity     Social History   Tobacco Use  . Smoking status: Former Smoker    Packs/day: 1.00    Years: 8.00    Pack years: 8.00    Types: Cigarettes    Last attempt to quit: 11/04/1990    Years since quitting: 28.4  . Smokeless tobacco: Never Used  Substance Use Topics  . Alcohol use: Not Currently    Current Outpatient Medications:  .  apixaban (ELIQUIS) 5 MG TABS tablet, TAKE 1 TABLET(5 MG) BY MOUTH TWICE DAILY, Disp: 180 tablet, Rfl: 1 .  atorvastatin (LIPITOR) 20 MG tablet, Take 1 tablet (20 mg total) by mouth daily at 6 PM., Disp: 30 tablet, Rfl: 9 .  diltiazem (CARDIZEM) 30 MG tablet, Take 1 tablet every 4 hours AS NEEDED for AFIB heart rate >100, Disp: 45 tablet, Rfl: 1 .  dofetilide (TIKOSYN) 500 MCG capsule, Take 1 capsule (500 mcg total) by mouth 2 (two) times daily., Disp: 180 capsule, Rfl: 2 .  metoprolol  tartrate (LOPRESSOR) 50 MG tablet, Take 1 tablet (50 mg total) by mouth 2 (two) times daily., Disp: 180 tablet, Rfl: 3 .  rizatriptan (MAXALT) 10 MG tablet, Take 10 mg by mouth as needed for migraine. May repeat in 2 hours if needed, Disp: , Rfl:  .  Topiramate ER (TROKENDI XR) 25 MG CP24, 7 days 25 mg, 7 days 50 mg, 7 days 100 mg, then 200 mg, Disp: , Rfl:  .  furosemide (LASIX) 40 MG tablet, Take 1 tablet (40 mg total) by  mouth daily. (Patient not taking: Reported on 03/31/2019), Disp: 15 tablet, Rfl: 0 .  potassium chloride SA (K-DUR) 20 MEQ tablet, Take 1 tablet (20 mEq total) by mouth daily. (Patient not taking: Reported on 03/31/2019), Disp: 30 tablet, Rfl: 3  Allergies  Allergen Reactions  . Codeine Nausea And Vomiting    Objective:   There were no vitals taken for this visit.  Patient is well-developed, well-nourished in no acute distress.  Resting comfortably at home.  Head is normocephalic, atraumatic.  No labored breathing.  Speech is clear and coherent with logical content.  Patient is alert and oriented at baseline.   Assessment and Plan:   1. Gastroesophageal reflux disease without esophagitis Refill Protonix. GERD diet reviewed with patient. Follow-up if any change in symptoms. - pantoprazole (PROTONIX) 40 MG tablet; Take 1 tablet (40 mg total) by mouth daily.  Dispense: 30 tablet; Refill: 3  2. Essential hypertension BP mildly elevated today secondary to pain. Patient has not taken all medications today. Encouraged her to take as directed. Restart DASH diet. Recheck at scheduled follow-up.  3. Major depressive disorder unspecified whether recurrent (HCC) Declines wanting intervention today as she notes she feels Topamax is helping. Will follow closely.   4. PAF (paroxysmal atrial fibrillation) (HCC) Continue management per Cardiology.  5. Chronic migraine without aura without status migrainosus, not intractable Continue management per Neurology  6. Other polyneuropathy Being assessed further by Neurology. Discussed potential for Gabapentin but she wants to follow with specialist first.     Piedad Climes, PA-C 03/31/2019

## 2019-03-31 NOTE — Progress Notes (Signed)
I have discussed the procedure for the virtual visit with the patient who has given consent to proceed with assessment and treatment.   Loc Feinstein S Williamson [place], CMA     

## 2019-04-02 ENCOUNTER — Telehealth (INDEPENDENT_AMBULATORY_CARE_PROVIDER_SITE_OTHER): Payer: BLUE CROSS/BLUE SHIELD | Admitting: Cardiology

## 2019-04-02 ENCOUNTER — Other Ambulatory Visit: Payer: Self-pay

## 2019-04-02 ENCOUNTER — Encounter: Payer: Self-pay | Admitting: Physician Assistant

## 2019-04-02 VITALS — BP 135/78 | HR 89 | Wt 332.0 lb

## 2019-04-02 DIAGNOSIS — I480 Paroxysmal atrial fibrillation: Secondary | ICD-10-CM | POA: Diagnosis not present

## 2019-04-02 DIAGNOSIS — G5762 Lesion of plantar nerve, left lower limb: Secondary | ICD-10-CM | POA: Diagnosis not present

## 2019-04-02 DIAGNOSIS — M84375D Stress fracture, left foot, subsequent encounter for fracture with routine healing: Secondary | ICD-10-CM | POA: Diagnosis not present

## 2019-04-02 DIAGNOSIS — Z79899 Other long term (current) drug therapy: Secondary | ICD-10-CM

## 2019-04-02 NOTE — Telephone Encounter (Signed)
Virtual Visit Pre-Appointment Phone Call  "(Name), I am calling you today to discuss your upcoming appointment. We are currently trying to limit exposure to the virus that causes COVID-19 by seeing patients at home rather than in the office."  1. "What is the BEST phone number to call the day of the visit?" - include this in appointment notes  2. "Do you have or have access to (through a family member/friend) a smartphone with video capability that we can use for your visit?" a. If yes - list this number in appt notes as "cell" (if different from BEST phone #) and list the appointment type as a VIDEO visit in appointment notes b. If no - list the appointment type as a PHONE visit in appointment notes  3. Confirm consent - "In the setting of the current Covid19 crisis, you are scheduled for a (phone or video) visit with your provider on (date) at (time).  Just as we do with many in-office visits, in order for you to participate in this visit, we must obtain consent.  If you'd like, I can send this to your mychart (if signed up) or email for you to review.  Otherwise, I can obtain your verbal consent now.  All virtual visits are billed to your insurance company just like a normal visit would be.  By agreeing to a virtual visit, we'd like you to understand that the technology does not allow for your provider to perform an examination, and thus may limit your provider's ability to fully assess your condition. If your provider identifies any concerns that need to be evaluated in person, we will make arrangements to do so.  Finally, though the technology is pretty good, we cannot assure that it will always work on either your or our end, and in the setting of a video visit, we may have to convert it to a phone-only visit.  In either situation, we cannot ensure that we have a secure connection.  Are you willing to proceed?" STAFF: Did the patient verbally acknowledge consent to telehealth visit? Document  YES/NO here: YES  4. Advise patient to be prepared - "Two hours prior to your appointment, go ahead and check your blood pressure, pulse, oxygen saturation, and your weight (if you have the equipment to check those) and write them all down. When your visit starts, your provider will ask you for this information. If you have an Apple Watch or Kardia device, please plan to have heart rate information ready on the day of your appointment. Please have a pen and paper handy nearby the day of the visit as well."  5. Give patient instructions for MyChart download to smartphone OR Doximity/[email or web address] as below if video visit (depending on what platform provider is using)  6. Inform patient they will receive a phone call 15 minutes prior to their appointment time (may be from unknown caller ID) so they should be prepared to answer    TELEPHONE CALL NOTE  Regina Morales has been deemed a candidate for a follow-up tele-health visit to limit community exposure during the Covid-19 pandemic. I spoke with the patient via phone to ensure availability of phone/video source, confirm preferred email & phone number, and discuss instructions and expectations.  I reminded Regina Morales to be prepared with any vital sign and/or heart rhythm information that could potentially be obtained via home monitoring, at the time of her visit. I reminded Regina Morales to expect a phone call prior to  her visit.  Baird Lyons, RN 04/02/2019 7:50 AM   INSTRUCTIONS FOR DOWNLOADING THE MYCHART APP TO SMARTPHONE  - The patient must first make sure to have activated MyChart and know their login information - If Apple, go to [employer] and type in MyChart in the search bar and download the app. If Android, ask patient to go to [employer] and type in [place] in the search bar and download the app. The app is free but as with any other app downloads, their phone may require them to verify saved payment information or  Apple/Android password.  - The patient will need to then log into the app with their MyChart username and password, and select [facility] as their healthcare provider to link the account. When it is time for your visit, go to the MyChart app, find appointments, and click Begin Video Visit. Be sure to Select Allow for your device to access the Microphone and Camera for your visit. You will then be connected, and your provider will be with you shortly.  **If they have any issues connecting, or need assistance please contact MyChart service desk [phone])**  **If using a computer, in order to ensure the best quality for their visit they will need to use either of the following Internet Browsers: [employer] or Google Chrome**  IF USING DOXIMITY or DOXY.ME - The patient will receive a link just prior to their visit by text.     FULL LENGTH CONSENT FOR TELE-HEALTH VISIT   I hereby voluntarily request, consent and authorize [facility] and its employed or contracted physicians, physician assistants, nurse practitioners or other licensed health care professionals (the Practitioner), to provide me with telemedicine health care services (the "Services") as deemed necessary by the treating Practitioner. I acknowledge and consent to receive the Services by the Practitioner via telemedicine. I understand that the telemedicine visit will involve communicating with the Practitioner through live audiovisual communication technology and the disclosure of certain medical information by electronic transmission. I acknowledge that I have been given the opportunity to request an in-person assessment or other available alternative prior to the telemedicine visit and am voluntarily participating in the telemedicine visit.  I understand that I have the right to withhold or withdraw my consent to the use of telemedicine in the course of my care at any time, without affecting my right to future care  or treatment, and that the Practitioner or I may terminate the telemedicine visit at any time. I understand that I have the right to inspect all information obtained and/or recorded in the course of the telemedicine visit and may receive copies of available information for a reasonable fee.  I understand that some of the potential risks of receiving the Services via telemedicine include:  Marland Kitchen Delay or interruption in medical evaluation due to technological equipment failure or disruption; . Information transmitted may not be sufficient (e.g. poor resolution of images) to allow for appropriate medical decision making by the Practitioner; and/or  . In rare instances, security protocols could fail, causing a breach of personal health information.  Furthermore, I acknowledge that it is my responsibility to provide information about my medical history, conditions and care that is complete and accurate to the best of my ability. I acknowledge that Practitioner's advice, recommendations, and/or decision may be based on factors not within their control, such as incomplete or inaccurate data provided by me or distortions of diagnostic images or specimens that may result from electronic transmissions.  I understand that the practice of medicine is not an exact science and that Practitioner makes no warranties or guarantees regarding treatment outcomes. I acknowledge that I will receive a copy of this consent concurrently upon execution via email to the email address I last provided but may also request a printed copy by calling the office of [facility].    I understand that my insurance will be billed for this visit.   I have read or had this consent read to me. . I understand the contents of this consent, which adequately explains the benefits and risks of the Services being provided via telemedicine.  . I have been provided ample opportunity to ask questions regarding this consent and the Services and have had  my questions answered to my satisfaction. . I give my informed consent for the services to be provided through the use of telemedicine in my medical care  By participating in this telemedicine visit I agree to the above.

## 2019-04-02 NOTE — Addendum Note (Signed)
Addended by: Baird Lyons on: 04/02/2019 01:51 PM   Modules accepted: Orders

## 2019-04-02 NOTE — Progress Notes (Signed)
Electrophysiology TeleHealth Note   Due to national recommendations of social distancing due to COVID 19, an audio/video telehealth visit is felt to be most appropriate for this patient at this time.  See Epic message for the patient's consent to telehealth for [facility].   Date:  04/02/2019   ID:  Regina Morales, DOB 1961/03/14, MRN [id number]  Location: patient's home  Provider location: [address] [place]  Evaluation Performed: Follow-up visit  PCP:  Waldon Merl, PA-C  Cardiologist:  [name], MD  Electrophysiologist:  Dr Elberta Fortis  Chief Complaint: Atrial fibrillation  History of Present Illness:    Regina Morales is a 58 y.o. female who presents via audio/video conferencing for a telehealth visit today.  Since last being seen in our clinic, the patient reports doing very well.  Today, she denies symptoms of palpitations, chest pain, shortness of breath,  lower extremity edema, dizziness, presyncope, or syncope.  The patient is otherwise without complaint today.  The patient denies symptoms of fevers, chills, cough, or new SOB worrisome for COVID 19.  She has a history of paroxysmal atrial fibrillation, hypertension, OSA on CPAP.  She was loaded on dofetilide 09/15/2018.  She presented to the emergency room this month for lower extremity swelling.  She was put on Lasix.  She has since stopped the Lasix as she thought she was getting dehydrated.  She is quite a bit overweight from her usual weight.  It sounds that she is potentially 30 to 40 pounds positive.  I do think this is likely some fluid weight.  She does get some shortness of breath and certainly has lower extremity edema per her history.  She is also been having some chest pressure, but she feels that this is all due to anxiety.  She has also noted a bit more palpitations.  Today, denies symptoms of orthopnea, PND, lower extremity edema, claudication, dizziness, presyncope, syncope,  bleeding, or neurologic sequela. The patient is tolerating medications without difficulties.    Past Medical History:  Diagnosis Date  . Anemia    hx years ago  . Buzzing in ear    "right; even after OR"  . Chest pain    a. 2009: neg MV  ([facility])  . Depression   . DVT (deep vein thrombosis) in pregnancy 1980's   LLE  . Fibromyalgia    "dx'd many many years ago; I haven't had any problems w/it" (04/15/2017)  . GERD (gastroesophageal reflux disease)   . Hepatic steatosis   . High cholesterol   . History of blood transfusion    "low count after appendix surgery" unsure # of units transfused  . Hypertension   . Migraines    "maybe once/2 months" (04/15/2017)  . Obesity   . OSA on CPAP   . Persistent atrial fibrillation    a. Dx 07/2013;  b. CHA2DS2VASc=1 (female);  c. 07/2013 Echo:  EF 50-55%, no rwma, mod LVH.  . Pulmonary embolism (HCC) ~ 1984   "after I had my last child"  . SVD (spontaneous vaginal delivery)    x 4    Past Surgical History:  Procedure Laterality Date  . APPENDECTOMY  1990's  . COLONOSCOPY    . DILATATION & CURETTAGE/HYSTEROSCOPY WITH MYOSURE N/A 04/14/2017   Procedure: DILATATION & CURETTAGE/HYSTEROSCOPY WITH MYOSURE;  Surgeon: Myna Hidalgo, DO;  Location: [facility];  Service: Gynecology;  Laterality: N/A;  Polypectomy  . DILATION AND CURETTAGE OF UTERUS  1983  mab  . [place] EAR SURGERY Right 2000's  . LEFT HEART CATH AND CORONARY ANGIOGRAPHY N/A 04/17/2017   Procedure: Left Heart Cath and Coronary Angiography;  Surgeon: Corky Crafts, MD;  Location: [facility] LAB;  Service: Cardiovascular;  Laterality: N/A;  . PLANTAR FASCIA RELEASE Right 07/2015  . TUBAL LIGATION  1985    Current Outpatient Medications  Medication Sig Dispense Refill  . apixaban (ELIQUIS) 5 MG TABS tablet TAKE 1 TABLET(5 MG) BY MOUTH TWICE DAILY 180 tablet 1  . atorvastatin (LIPITOR) 20 MG tablet Take 1 tablet (20 mg total) by mouth daily at 6 PM. 30 tablet 9  . diltiazem  (CARDIZEM) 30 MG tablet Take 1 tablet every 4 hours AS NEEDED for AFIB heart rate >100 45 tablet 1  . dofetilide (TIKOSYN) 500 MCG capsule Take 1 capsule (500 mcg total) by mouth 2 (two) times daily. 180 capsule 2  . pantoprazole (PROTONIX) 40 MG tablet Take 1 tablet (40 mg total) by mouth daily. 30 tablet 3  . potassium chloride SA (K-DUR) 20 MEQ tablet Take 1 tablet (20 mEq total) by mouth daily. 30 tablet 3  . rizatriptan (MAXALT) 10 MG tablet Take 10 mg by mouth as needed for migraine. May repeat in 2 hours if needed    . Topiramate ER (TROKENDI XR) 25 MG CP24 7 days 25 mg, 7 days 50 mg, 7 days 100 mg, then 200 mg    . furosemide (LASIX) 40 MG tablet Take 1 tablet (40 mg total) by mouth daily. (Patient not taking: Reported on 04/02/2019) 15 tablet 0  . metoprolol tartrate (LOPRESSOR) 50 MG tablet Take 1 tablet (50 mg total) by mouth 2 (two) times daily. 180 tablet 3   No current facility-administered medications for this visit.     Allergies:   Codeine   Social History:  The patient  reports that she quit smoking about 28 years ago. Her smoking use included cigarettes. She has a 8.00 pack-year smoking history. She has never used smokeless tobacco. She reports previous alcohol use. She reports that she does not use drugs.   Family History:  The patient's  family history includes Alcohol abuse in her father; Atrial fibrillation in her brother; Cardiomyopathy in her daughter; Congestive Heart Failure in her mother; Kidney failure in her mother; Lung cancer in her father; Sudden Cardiac Death in her daughter; Wolff Parkinson White syndrome in her grandchild.   ROS:  Please see the history of present illness.   All other systems are personally reviewed and negative.    Exam:    Vital Signs:  BP 135/78   Pulse 89   Wt (!) 332 lb (150.6 kg)   BMI 52.00 kg/m   Well appearing, alert and conversant, regular work of breathing,  good skin color Eyes- anicteric, neuro- grossly intact, skin- no  apparent rash or lesions or cyanosis, mouth- oral mucosa is pink   Labs/Other Tests and Data Reviewed:    Recent Labs: 08/07/2018: TSH 1.000 09/18/2018: Magnesium 2.3 03/22/2019: B Natriuretic Peptide 56.0; BUN 13; Creatinine, Ser 0.70; Hemoglobin 11.9; Platelets 308; Potassium 3.7; Sodium 141   Wt Readings from Last 3 Encounters:  04/02/19 (!) 332 lb (150.6 kg)  03/31/19 (!) 326 lb (147.9 kg)  03/22/19 (!) 320 lb (145.2 kg)     Other studies personally reviewed: Additional studies/ records that were reviewed today include: ECG 03/29/2019 personally reviewed Review of the above records today demonstrates: Sinus rhythm, left axis deviation   ASSESSMENT & PLAN:  1.  Paroxysmal atrial fibrillation: Currently on dofetilide, Eliquis.  She does need a basic metabolic and magnesium.  QTC has remained stable.  It does sound like she is having more palpitations, but if she is volume overloaded this can contribute.  We Bellamy Judson plan for diuresis.  This patients CHA2DS2-VASc Score and unadjusted Ischemic Stroke Rate (% per year) is equal to 2.2 % stroke rate/year from a score of 2  Above score calculated as 1 point each if present [CHF, HTN, DM, Vascular=MI/PAD/Aortic Plaque, Age if 65-74, or Female] Above score calculated as 2 points each if present [Age > 75, or Stroke/TIA/TE]  2.  Obstructive sleep apnea: CPAP compliance encouraged  3.  Hypertension: Well-controlled  4.  Morbid obesity: BMI in the mid 40s.  Lifestyle modifications encouraged.  5.  Lower extremity edema: Patient remains volume overloaded.  She has been taking Lasix.  I have asked her to take Lasix for the next week and to send a MyChart message with her weight.  It does sound like she is over her dry weight by potentially 40 to 50 pounds.  We Maja Mccaffery continue with diuresis.  COVID 19 screen The patient denies symptoms of COVID 19 at this time.  The importance of social distancing was discussed today.  Follow-up:  3 months    Current medicines are reviewed at length with the patient today.   The patient does not have concerns regarding her medicines.  The following changes were made today:  none  Labs/ tests ordered today include:  No orders of the defined types were placed in this encounter.    Patient Risk:  after full review of this patients clinical status, I feel that they are at moderate risk at this time.  Today, I have spent 9 minutes with the patient with telehealth technology discussing AF, LE edema .    Signed, Georgian Mcclory Jorja LoaMartin Lennyx Verdell, MD  04/02/2019 10:30 AM     [facility] [address] [place] [address] [phone] (office) [phone] (fax)

## 2019-04-05 ENCOUNTER — Emergency Department (HOSPITAL_COMMUNITY): Payer: BC Managed Care – PPO

## 2019-04-05 ENCOUNTER — Telehealth: Payer: Self-pay

## 2019-04-05 ENCOUNTER — Emergency Department (HOSPITAL_COMMUNITY)
Admission: EM | Admit: 2019-04-05 | Discharge: 2019-04-05 | Disposition: A | Discharge status: Home / Self Care | Payer: BC Managed Care – PPO | Attending: Emergency Medicine | Admitting: Emergency Medicine

## 2019-04-05 ENCOUNTER — Encounter (HOSPITAL_COMMUNITY): Payer: Self-pay

## 2019-04-05 ENCOUNTER — Other Ambulatory Visit: Payer: Self-pay

## 2019-04-05 DIAGNOSIS — I10 Essential (primary) hypertension: Secondary | ICD-10-CM | POA: Diagnosis not present

## 2019-04-05 DIAGNOSIS — Z79899 Other long term (current) drug therapy: Secondary | ICD-10-CM | POA: Insufficient documentation

## 2019-04-05 DIAGNOSIS — M545 Low back pain, unspecified: Secondary | ICD-10-CM

## 2019-04-05 DIAGNOSIS — R109 Unspecified abdominal pain: Secondary | ICD-10-CM | POA: Diagnosis not present

## 2019-04-05 DIAGNOSIS — Z7901 Long term (current) use of anticoagulants: Secondary | ICD-10-CM | POA: Diagnosis not present

## 2019-04-05 DIAGNOSIS — Z87891 Personal history of nicotine dependence: Secondary | ICD-10-CM | POA: Diagnosis not present

## 2019-04-05 DIAGNOSIS — R52 Pain, unspecified: Secondary | ICD-10-CM | POA: Diagnosis not present

## 2019-04-05 DIAGNOSIS — M5442 Lumbago with sciatica, left side: Secondary | ICD-10-CM | POA: Diagnosis not present

## 2019-04-05 LAB — URINALYSIS, ROUTINE W REFLEX MICROSCOPIC
Bilirubin Urine: NEGATIVE
Glucose, UA: NEGATIVE mg/dL
Hgb urine dipstick: NEGATIVE
Ketones, ur: NEGATIVE mg/dL
Leukocytes,Ua: NEGATIVE
Nitrite: NEGATIVE
Protein, ur: NEGATIVE mg/dL
Specific Gravity, Urine: 1.011 (ref 1.005–1.030)
pH: 5 (ref 5.0–8.0)

## 2019-04-05 MED ORDER — KETOROLAC TROMETHAMINE 30 MG/ML IJ SOLN
15.0000 mg | Freq: Once | INTRAMUSCULAR | Status: AC
  Administered 2019-04-05: 07:00:00 15 mg via INTRAVENOUS
  Filled 2019-04-05: qty 1

## 2019-04-05 MED ORDER — HYDROMORPHONE HCL 1 MG/ML IJ SOLN
0.5000 mg | Freq: Once | INTRAMUSCULAR | Status: AC
  Administered 2019-04-05: 0.5 mg via INTRAVENOUS
  Filled 2019-04-05: qty 1

## 2019-04-05 MED ORDER — DEXAMETHASONE SODIUM PHOSPHATE 10 MG/ML IJ SOLN
10.0000 mg | Freq: Once | INTRAMUSCULAR | Status: AC
  Administered 2019-04-05: 07:00:00 10 mg via INTRAVENOUS
  Filled 2019-04-05: qty 1

## 2019-04-05 MED ORDER — LIDOCAINE 5 % EX PTCH: 1.0000 | MEDICATED_PATCH | CUTANEOUS | 0 refills | Status: DC

## 2019-04-05 MED ORDER — KETOROLAC TROMETHAMINE 30 MG/ML IJ SOLN
15.0000 mg | Freq: Once | INTRAMUSCULAR | Status: AC
  Administered 2019-04-05: 15 mg via INTRAVENOUS
  Filled 2019-04-05: qty 1

## 2019-04-05 MED ORDER — LIDOCAINE 5 % EX PTCH
1.0000 | MEDICATED_PATCH | CUTANEOUS | Status: DC
  Administered 2019-04-05: 05:00:00 1 via TRANSDERMAL
  Filled 2019-04-05: qty 1

## 2019-04-05 MED ORDER — HYDROCODONE-ACETAMINOPHEN 5-325 MG PO TABS: 1.0000 | ORAL_TABLET | Freq: Four times a day (QID) | ORAL | 0 refills | Status: DC | PRN

## 2019-04-05 MED ORDER — OXYCODONE-ACETAMINOPHEN 5-325 MG PO TABS
1.0000 | ORAL_TABLET | Freq: Once | ORAL | Status: AC
  Administered 2019-04-05: 06:00:00 1 via ORAL
  Filled 2019-04-05: qty 1

## 2019-04-05 MED ORDER — HYDROMORPHONE HCL 1 MG/ML IJ SOLN
1.0000 mg | Freq: Once | INTRAMUSCULAR | Status: AC
  Administered 2019-04-05: 05:00:00 1 mg via INTRAVENOUS
  Filled 2019-04-05: qty 1

## 2019-04-05 MED ORDER — METHOCARBAMOL 500 MG PO TABS: 500.0000 mg | ORAL_TABLET | Freq: Two times a day (BID) | ORAL | 0 refills | Status: DC

## 2019-04-05 MED ORDER — HYDROMORPHONE HCL 1 MG/ML IJ SOLN
0.5000 mg | Freq: Once | INTRAMUSCULAR | Status: AC
  Administered 2019-04-05: 07:00:00 0.5 mg via INTRAVENOUS
  Filled 2019-04-05: qty 1

## 2019-04-05 MED ORDER — ONDANSETRON HCL 4 MG/2ML IJ SOLN
4.0000 mg | Freq: Once | INTRAMUSCULAR | Status: AC
  Administered 2019-04-05: 05:00:00 4 mg via INTRAVENOUS
  Filled 2019-04-05: qty 2

## 2019-04-05 MED ORDER — ONDANSETRON HCL 4 MG/2ML IJ SOLN
4.0000 mg | Freq: Once | INTRAMUSCULAR | Status: AC
  Administered 2019-04-05: 4 mg via INTRAVENOUS
  Filled 2019-04-05: qty 2

## 2019-04-05 NOTE — ED Notes (Signed)
Patient transported to X-ray 

## 2019-04-05 NOTE — ED Notes (Signed)
Pt is now able to tolerate standing up and walking a short distance with notably lesser pain.

## 2019-04-05 NOTE — Discharge Instructions (Signed)
As we discussed, your x-ray did not show any acute broken bones.  There was mention of some degenerative changes such as disc space narrowing.  This could be contributing to your pain.  If you continue to have symptoms or your pain gets worse, you may need to follow-up with referred neurosurgery for further management.   Use lidocaine patches as directed.  Take pain medications as directed for break through pain. Do not drive or operate machinery while taking this medication.   Muscle relaxers to help with spasms.  Do not take at the same time as pain medication.  Return to the Emergency Department immediately for any worsening back pain, neck pain, difficulty walking, numbness/weaknss of your arms or legs, urinary or bowel accidents, fever or any other worsening or concerning symptoms.

## 2019-04-05 NOTE — ED Provider Notes (Signed)
Care assumed from [facility], PA-C at shift change with XR and re-evaluation pending.   In brief, this patient is a 58 y.o. F with PMH/o fibromyalgia, dyslipidemia, HTN, who presents for evaluation of left flank pain that began today at approximately 1AM. Her pain does not radiate to the anterior aspect. No associated urinary complaints. Her pain is worse with movement. Please see note from previous provider for full history/physical exam.    Physical Exam  BP (!) 186/77 (BP Location: Right Arm)   Pulse (!) 51   Temp 98.1 F (36.7 C) (Oral)   Resp 19   Ht 5\' 7"  (1.702 m)   Wt (!) 150 kg   SpO2 94%   BMI 51.79 kg/m   Physical Exam  2+ radial and DP pulses bilaterally LLE is CAM walker boot.  MAE, Follows commands. Normal strength  Abdomen is soft, non-distended, non-tender. No rigidity, No guarding. No peritoneal signs.  ED Course/Procedures     Procedures  MDM    PLAN: Patient getting XRs. If XR unremarkable, plan to ambulate and dispo home with pain meds.   MDM:  XRs reviewed. Degenerative change as described. Disc space narrowing noted to L4-L5. Trace anterolisthesis at L3-L4.  Reevaluation of patient.  Patient reports pain is improved but does report that it occurs again with movement.  Will attempt to ambulate patient in the ED.  Attempted to ambulate patient in the ED but she was having difficulty secondary to pain. Plan for additional pain medications and reassess.   Reevaluation after additional analgesics.  Patient was able to ambulate in her room a few steps.  She still reports pain but states it is improved.  Patient states she is wants to go home.  Will give short course of pain medication as well as muscle relaxant with pain.  She does have a history of diabetes will hold off on steroids.  Discussed with patient regarding her x-ray findings.  If she continues to have symptoms, she may need outpatient follow-up with neurosurgery for further evaluation/management  of her pain. At this time, patient exhibits no emergent life-threatening condition that require further evaluation in ED or admission.  No indication for further imaging here in ED.  No indication for MRI.  Portions of this note were generated with Scientist, clinical (histocompatibility and immunogenetics). Dictation errors may occur despite best attempts at proofreading.     1. Acute left-sided low back pain, unspecified whether sciatica present       Maxwell Caul, PA-C 04/05/19 1330    Mancel Bale, MD 04/05/19 1729

## 2019-04-05 NOTE — ED Notes (Signed)
Attempted to ambulate pt. Pt was unable to get out of bed or ambulate due to pain.

## 2019-04-05 NOTE — ED Triage Notes (Signed)
Per ems : Pt coming from home c/o left flank pain that started at 0130 this morning. Pt A&Ox4 and ambulatory with assistance and pain. Hx of HTN

## 2019-04-05 NOTE — ED Notes (Signed)
Discharge instructions reviewed with patient. Patient verbalizes understanding. VSS.   

## 2019-04-05 NOTE — ED Provider Notes (Signed)
[facility]-EMERGENCY DEPT Provider Note   CSN: [id number] Arrival date & time: 04/05/19  0248    History   Chief Complaint Chief Complaint  Patient presents with  . Flank Pain    left     HPI Regina Morales is a 58 y.o. female.     58 year old female with a history of fibromyalgia, dyslipidemia, hypertension, atrial fibrillation (on Tikosyn and Eliquis) presents to the emergency department for evaluation of left flank pain.  She states that she got up around 1 AM to use the bathroom.  She was getting back into her bed when she felt sudden onset of a pain in her left flank.  This radiates towards her left buttock.  It is worse with movement.  No medications taken prior to arrival for pain.  Able to ambulate with assistance in triage, though this aggravated discomfort.  No history of fall, trauma, bowel or bladder incontinence, extremity numbness or paresthesias, extremity weakness, dysuria, hematuria, fever.  Denies history of prior back issues.  The history is provided by the patient. No language interpreter was used.  Flank Pain     Past Medical History:  Diagnosis Date  . Anemia    hx years ago  . Buzzing in ear    "right; even after OR"  . Chest pain    a. 2009: neg MV  ([facility])  . Depression   . DVT (deep vein thrombosis) in pregnancy 1980's   LLE  . Fibromyalgia    "dx'd many many years ago; I haven't had any problems w/it" (04/15/2017)  . GERD (gastroesophageal reflux disease)   . Hepatic steatosis   . High cholesterol   . History of blood transfusion    "low count after appendix surgery" unsure # of units transfused  . Hypertension   . Migraines    "maybe once/2 months" (04/15/2017)  . Obesity   . OSA on CPAP   . Persistent atrial fibrillation    a. Dx 07/2013;  b. CHA2DS2VASc=1 (female);  c. 07/2013 Echo:  EF 50-55%, no rwma, mod LVH.  . Pulmonary embolism (HCC) ~ 1984   "after I had my last child"  . SVD (spontaneous vaginal delivery)     x 4    Patient Active Problem List   Diagnosis Date Noted  . Visit for monitoring Tikosyn therapy 09/15/2018  . 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 Tendinitis of right elbow 11/25/2017  . Abnormal nuclear stress test   . Essential hypertension   . Endometrial polyp   . Persistent atrial fibrillation   . Normocytic anemia 04/24/2014  . Depression 04/22/2014  . Migraine 04/22/2014  . PAF (paroxysmal atrial fibrillation) (HCC)   . GERD (gastroesophageal reflux disease)   . Atypical chest pain   . Obesity     Past Surgical History:  Procedure Laterality Date  . APPENDECTOMY  1990's  . COLONOSCOPY    . DILATATION & CURETTAGE/HYSTEROSCOPY WITH MYOSURE N/A 04/14/2017   Procedure: DILATATION & CURETTAGE/HYSTEROSCOPY WITH MYOSURE;  Surgeon: Myna Hidalgo, DO;  Location: [facility];  Service: Gynecology;  Laterality: N/A;  Polypectomy  . DILATION AND CURETTAGE OF UTERUS  1983   mab  . INNER EAR SURGERY Right 2000's  . LEFT HEART CATH AND CORONARY ANGIOGRAPHY N/A 04/17/2017   Procedure: Left Heart Cath and Coronary Angiography;  Surgeon: Corky Crafts, MD;  Location: [facility] INVASIVE CV LAB;  Service: Cardiovascular;  Laterality: N/A;  . PLANTAR FASCIA RELEASE Right 07/2015  . TUBAL LIGATION  1985     OB History   No obstetric history on file.      Home Medications    Prior to Admission medications   Medication Sig Start Date End Date Taking? Authorizing Provider  apixaban (ELIQUIS) 5 MG TABS tablet TAKE 1 TABLET(5 MG) BY MOUTH TWICE DAILY Patient taking differently: Take 5 mg by mouth 2 (two) times daily.  10/05/18  Yes Wendall StadeNishan, Peter C, MD  atorvastatin (LIPITOR) 20 MG tablet Take 1 tablet (20 mg total) by mouth daily at 6 PM. 04/18/17  Yes Neva SeatGreene, Tiffany, PA-C  diltiazem (CARDIZEM) 30 MG tablet Take 1 tablet every 4 hours AS NEEDED for AFIB heart rate >100 12/25/17  Yes Newman Niparroll, Donna C, NP  dofetilide (TIKOSYN) 500 MCG capsule Take 1 capsule (500 mcg total) by mouth 2 (two)  times daily. 09/18/18  Yes Sheilah PigeonUrsuy, [name] Lynn, PA-C  furosemide (LASIX) 40 MG tablet Take 1 tablet (40 mg total) by mouth daily. 03/23/19  Yes Dione BoozeGlick, David, MD  metoprolol tartrate (LOPRESSOR) 50 MG tablet Take 1 tablet (50 mg total) by mouth 2 (two) times daily. 08/07/18 04/05/19 Yes Bhagat, Bhavinkumar, PA  pantoprazole (PROTONIX) 40 MG tablet Take 1 tablet (40 mg total) by mouth daily. 03/31/19  Yes Waldon MerlMartin, William C, PA-C  potassium chloride SA (K-DUR) 20 MEQ tablet Take 1 tablet (20 mEq total) by mouth daily. 03/24/19  Yes Newman Niparroll, Donna C, NP  rizatriptan (MAXALT) 10 MG tablet Take 10 mg by mouth as needed for migraine. May repeat in 2 hours if needed   Yes [provider]  Topiramate ER (TROKENDI XR) 50 MG CP24 Take 50 mg by mouth daily.   Yes [provider]    Family History Family History  Problem Relation Age of Onset  . Lung cancer Father   . Alcohol abuse Father   . Kidney failure Mother        s/p renal Tx  . Congestive Heart Failure Mother   . Atrial fibrillation Brother   . Sudden Cardiac Death Daughter        cardiac arrest  . Cardiomyopathy Daughter   . Evelene CroonWolff Parkinson White syndrome Grandchild         granddaughter    Social History Social History   Tobacco Use  . Smoking status: Former Smoker    Packs/day: 1.00    Years: 8.00    Pack years: 8.00    Types: Cigarettes    Last attempt to quit: 11/04/1990    Years since quitting: 28.4  . Smokeless tobacco: Never Used  Substance Use Topics  . Alcohol use: Not Currently  . Drug use: No     Allergies   Codeine   Review of Systems Review of Systems  Genitourinary: Positive for flank pain.  Ten systems reviewed and are negative for acute change, except as noted in the HPI.    Physical Exam Updated Vital Signs BP (!) 154/91   Pulse (!) 55   Temp 98.1 F (36.7 C) (Oral)   Resp 16   Ht 5\' 7"  (1.702 m)   Wt (!) 150 kg   SpO2 95%   BMI 51.79 kg/m   Physical Exam Vitals signs and  nursing note reviewed.  Constitutional:      General: She is not in acute distress.    Appearance: She is well-developed. She is not diaphoretic.     Comments: Nontoxic appearing and in NAD  HENT:     Head: Normocephalic and atraumatic.  Eyes:  General: No scleral icterus.    Conjunctiva/sclera: Conjunctivae normal.  Neck:     Musculoskeletal: Normal range of motion.  Cardiovascular:     Rate and Rhythm: Normal rate and regular rhythm.     Pulses: Normal pulses.  Pulmonary:     Effort: Pulmonary effort is normal. No respiratory distress.     Comments: Respirations even and unlabored Musculoskeletal: Normal range of motion.     Lumbar back: She exhibits tenderness and pain. She exhibits no bony tenderness.       Back:     Comments: No tenderness to palpation to the thoracic or lumbosacral midline.  No bony deformities, step-offs, crepitus.  Skin:    General: Skin is warm and dry.     Coloration: Skin is not pale.     Findings: No erythema or rash.  Neurological:     Mental Status: She is alert and oriented to person, place, and time.     Coordination: Coordination normal.     Comments: Moving all extremities, but cries out in pain when rolling laterally in the bed.  Psychiatric:        Behavior: Behavior normal.      ED Treatments / Results  Labs (all labs ordered are listed, but only abnormal results are displayed) Labs Reviewed  URINALYSIS, ROUTINE W REFLEX MICROSCOPIC - Abnormal; Notable for the following components:      Result Value   Color, Urine STRAW (*)    All other components within normal limits    EKG None  Radiology No results found.  Procedures Procedures (including critical care time)  Medications Ordered in ED Medications  lidocaine (LIDODERM) 5 % 1 patch (1 patch Transdermal Patch Applied 04/05/19 0440)  dexamethasone (DECADRON) injection 10 mg (has no administration in time range)  HYDROmorphone (DILAUDID) injection 0.5 mg (has no  administration in time range)  ketorolac (TORADOL) 30 MG/ML injection 15 mg (has no administration in time range)  ketorolac (TORADOL) 30 MG/ML injection 15 mg (15 mg Intravenous Given 04/05/19 0437)  HYDROmorphone (DILAUDID) injection 1 mg (1 mg Intravenous Given 04/05/19 0437)  ondansetron (ZOFRAN) injection 4 mg (4 mg Intravenous Given 04/05/19 0436)  oxyCODONE-acetaminophen (PERCOCET/ROXICET) 5-325 MG per tablet 1 tablet (1 tablet Oral Given 04/05/19 0548)     Initial Impression / Assessment and Plan / ED Course  I have reviewed the triage vital signs and the nursing notes.  Pertinent labs & imaging results that were available during my care of the patient were reviewed by me and considered in my medical decision making (see chart for details).        91:45 AM 58 year old female with a history of fibromyalgia presents to the emergency department for left flank/low back pain which began when she was ambulating back to her bed after voiding tonight.  She is neurovascularly intact without red flags or signs concerning for cauda equina.  Plan to obtain urinalysis to exclude renal causes, though suspect MSK etiology given worsening pain with movement.  Patient morbidly obese which may also be contributing to pain onset.  Will treat with Dilaudid, Toradol, Robaxin, Lidoderm patch and reassess.  4:40 AM Medications administered.  5:44 AM Pain currently rated 2/10 down from 10/10 on arrival. Will attempt ambulation.   6:44 AM Patient reports inability to ambulate 2/2 pain. She has been ordered to receive IV Decadron and additional dose of IV Dilaudid. Will obtain Xray given persistent symptoms/decreased mobility. Patient signed out to Gwendalyn Ege, PA-C at shift change who will  reassess and disposition appropriately.   Final Clinical Impressions(s) / ED Diagnoses   Final diagnoses:  Acute left-sided low back pain, unspecified whether sciatica present    ED Discharge Orders    None        Antony Madura, PA-C 04/05/19 1610    Palumbo, April, MD 04/05/19 [id number]

## 2019-04-05 NOTE — ED Notes (Signed)
Patient given cup of water.

## 2019-04-05 NOTE — Telephone Encounter (Signed)
    COVID-19 Pre-Screening Questions:  . In the past 7 to 10 days have you had a cough,  shortness of breath, headache, congestion, fever (100 or greater) body aches, chills, sore throat, or sudden loss of taste or sense of smell? . Have you been around anyone with known Covid 19. . Have you been around anyone who is awaiting Covid 19 test results in the past 7 to 10 days? . Have you been around anyone who has been exposed to Covid 19, or has mentioned symptoms of Covid 19 within the past 7 to 10 days?  If you have any concerns/questions about symptoms patients report during screening (either on the phone or at threshold). Contact the provider seeing the patient or DOD for further guidance.  If neither are available contact a member of the leadership team.           Pt answered NO to all pre-screening questions. 

## 2019-04-05 NOTE — ED Notes (Signed)
Bed: IW58 Expected date:  Expected time:  Means of arrival:  Comments: 24F back pain

## 2019-04-05 NOTE — ED Notes (Addendum)
This nurse in pt room to ambulate pt per MD order. On assessment pt laying in bed, pt able to rotate to side of bed independent. This nurse to assist pt in standing up. Pt stood up with assistance, reports she is unable to walk d/t pain. This nurse to notify EDP

## 2019-04-05 NOTE — ED Notes (Addendum)
Pt placed on purewick due to frequent urination. Pt knows to call staff if urine specimen is available. Pt has call bell within reach.

## 2019-04-06 ENCOUNTER — Telehealth: Payer: Self-pay | Admitting: Podiatry

## 2019-04-06 ENCOUNTER — Encounter: Payer: Self-pay | Admitting: Physician Assistant

## 2019-04-06 ENCOUNTER — Ambulatory Visit (INDEPENDENT_AMBULATORY_CARE_PROVIDER_SITE_OTHER): Payer: BC Managed Care – PPO | Admitting: Physician Assistant

## 2019-04-06 VITALS — BP 144/94 | HR 78 | Temp 98.3°F

## 2019-04-06 DIAGNOSIS — M5416 Radiculopathy, lumbar region: Secondary | ICD-10-CM | POA: Diagnosis not present

## 2019-04-06 DIAGNOSIS — M546 Pain in thoracic spine: Secondary | ICD-10-CM | POA: Diagnosis not present

## 2019-04-06 DIAGNOSIS — M4310 Spondylolisthesis, site unspecified: Secondary | ICD-10-CM

## 2019-04-06 DIAGNOSIS — M47816 Spondylosis without myelopathy or radiculopathy, lumbar region: Secondary | ICD-10-CM | POA: Diagnosis not present

## 2019-04-06 MED ORDER — GABAPENTIN 100 MG PO CAPS: ORAL_CAPSULE | ORAL | 0 refills | Status: DC

## 2019-04-06 NOTE — Patient Instructions (Addendum)
Please take the Gabapentin as directed, titrating up on the dose. Tylenol for breakthrough pain. If pain is severe you can take the hydrocodone the ER provider gave you, taking a Zofran beforehand (I have sent in a script) can help prevent any nausea or vomiting.  We are still waiting to hear back from your Neurologist and Podiatrist. I know Regina Morales has you scheduled on Thursday with Neurosurgery who will be able to get you some answers and some longer-lasting relief.  IF anything worsens, please go to the ER.

## 2019-04-06 NOTE — Progress Notes (Signed)
I have discussed the procedure for the virtual visit with the patient who has given consent to proceed with assessment and treatment.   Noralyn Karim S Anusha Claus, CMA     

## 2019-04-06 NOTE — Telephone Encounter (Signed)
Pt called stating that she had some labs done previously and has not received any results. Pt calling to follow up and see if results are in. Please give patient a call.

## 2019-04-06 NOTE — Progress Notes (Signed)
Virtual Visit via Video   I connected with patient on 04/06/19 at 10:20 AM EDT by a video enabled telemedicine application and verified that I am speaking with the correct person using two identifiers.  Location patient: Home [facility] Persons participating in the virtual visit: Patient, Provider, CMA (Patina Moore)  I discussed the limitations of evaluation and management by telemedicine and the availability of in person appointments. The patient expressed understanding and agreed to proceed.  Subjective:   HPI:   Patient presents via Doxy.Me today for ER follow-up. Patient seen in ER yesterday for acute left-sided back pain with radiation into hip that started abruptly the night before. Pain was so severe and she was not able to move so EMS was called and [facility]. ER assessment included examination, negative urinarlysis and xray lumbar spine (degenerative changes with disc space narrowing at L4-5. Trace anterolisthesis L3-4. Lower lumbar facet arthropathy). Patient was given several rounds of pain medication in attempt to get her up and ambulating. Patient notes she was able to eventually ambulate but with severe pain. Patient was discharged home to follow-up with Neurosurgery. Was given Rx for Hydrocodone which she has intolerance to. .   Since discharge, patient endorses continued significant pain in the Left lumbar region with continue radiation into her LLE. Denies saddle anesthesia or change to bowel or bladder habits but does note some mild tingling in the leg, above her baseline neuropathy. Has not been able to hear back from the specialist yet. Notes again that she was given hydrocodone which she cannot tolerate due to vomiting. States she iterated this to ER staff even when she was given pain medicine in the ER. Notes she was still vomiting on discharge and was given an emesis bag to use on the ride home.   ROS:   See pertinent  positives and negatives per HPI.  Patient Active Problem List   Diagnosis Date Noted  . Visit for monitoring Tikosyn therapy 09/15/2018  . 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 Tendinitis of right elbow 11/25/2017  . Abnormal nuclear stress test   . Essential hypertension   . Endometrial polyp   . Persistent atrial fibrillation   . Normocytic anemia 04/24/2014  . Depression 04/22/2014  . Migraine 04/22/2014  . PAF (paroxysmal atrial fibrillation) (HCC)   . GERD (gastroesophageal reflux disease)   . Atypical chest pain   . Obesity     Social History   Tobacco Use  . Smoking status: Former Smoker    Packs/day: 1.00    Years: 8.00    Pack years: 8.00    Types: Cigarettes    Last attempt to quit: 11/04/1990    Years since quitting: 28.4  . Smokeless tobacco: Never Used  Substance Use Topics  . Alcohol use: Not Currently    Current Outpatient Medications:  .  apixaban (ELIQUIS) 5 MG TABS tablet, TAKE 1 TABLET(5 MG) BY MOUTH TWICE DAILY (Patient taking differently: Take 5 mg by mouth 2 (two) times daily. ), Disp: 180 tablet, Rfl: 1 .  atorvastatin (LIPITOR) 20 MG tablet, Take 1 tablet (20 mg total) by mouth daily at 6 PM., Disp: 30 tablet, Rfl: 9 .  diltiazem (CARDIZEM) 30 MG tablet, Take 1 tablet every 4 hours AS NEEDED for AFIB heart rate >100, Disp: 45 tablet, Rfl: 1 .  dofetilide (TIKOSYN) 500 MCG capsule, Take 1 capsule (500 mcg total) by mouth 2 (two) times daily., Disp: 180 capsule, Rfl: 2 .  furosemide (LASIX) 40 MG tablet, Take 1 tablet (40 mg total) by mouth daily., Disp: 15 tablet, Rfl: 0 .  methocarbamol (ROBAXIN) 500 MG tablet, Take 1 tablet (500 mg total) by mouth 2 (two) times daily., Disp: 16 tablet, Rfl: 0 .  metoprolol tartrate (LOPRESSOR) 50 MG tablet, Take 1 tablet (50 mg total) by mouth 2 (two) times daily., Disp: 180 tablet, Rfl: 3 .  pantoprazole (PROTONIX) 40 MG tablet, Take 1 tablet (40 mg total) by mouth daily., Disp: 30 tablet, Rfl: 3 .  potassium chloride  SA (K-DUR) 20 MEQ tablet, Take 1 tablet (20 mEq total) by mouth daily., Disp: 30 tablet, Rfl: 3 .  rizatriptan (MAXALT) 10 MG tablet, Take 10 mg by mouth as needed for migraine. May repeat in 2 hours if needed, Disp: , Rfl:  .  Topiramate ER (TROKENDI XR) 50 MG CP24, Take 50 mg by mouth daily., Disp: , Rfl:  .  lidocaine (LIDODERM) 5 %, Place 1 patch onto the skin daily. Remove & Discard patch within 12 hours or as directed by MD (Patient not taking: Reported on 04/06/2019), Disp: 10 patch, Rfl: 0  Allergies  Allergen Reactions  . Codeine Nausea And Vomiting    Objective:   BP (!) 144/94   Pulse 78   Temp 98.3 F (36.8 C) (Oral)   Patient is well-developed, well-nourished in mild-mod painful distress.  Resting in bed at home.  Head is normocephalic, atraumatic.  No labored breathing.  Speech is clear and coherent with logical contest.  Patient is alert and oriented at baseline.   Assessment and Plan:    1. Acute right-sided thoracic back pain 2. Lumbar spondylosis 3. Anterolisthesis 4. Lumbar radiculopathy  Urgent referral placed to Neurosurgery for further investigation. She needs MRI, there is no question of this. Unable to tolerate multiple medications due to allergy, intolerance and current chronic medications. Will start her on Gabapentin. Can still utilize the hydrocodone if needed with a preemptive zofran. Will consider steroids as well but want to be mindful of this with heart history. Strict ER precautions reviewed with patient.  - Ambulatory referral to Neurosurgery    Piedad Climes, PA-C 04/06/2019

## 2019-04-07 ENCOUNTER — Other Ambulatory Visit: Payer: BLUE CROSS/BLUE SHIELD

## 2019-04-08 DIAGNOSIS — M5440 Lumbago with sciatica, unspecified side: Secondary | ICD-10-CM | POA: Diagnosis not present

## 2019-04-09 ENCOUNTER — Ambulatory Visit (INDEPENDENT_AMBULATORY_CARE_PROVIDER_SITE_OTHER): Payer: BC Managed Care – PPO | Admitting: Physician Assistant

## 2019-04-09 ENCOUNTER — Other Ambulatory Visit: Payer: Self-pay

## 2019-04-09 ENCOUNTER — Encounter: Payer: Self-pay | Admitting: Physician Assistant

## 2019-04-09 ENCOUNTER — Encounter: Payer: Self-pay | Admitting: Podiatry

## 2019-04-09 ENCOUNTER — Ambulatory Visit (INDEPENDENT_AMBULATORY_CARE_PROVIDER_SITE_OTHER): Payer: BC Managed Care – PPO | Admitting: Podiatry

## 2019-04-09 VITALS — Temp 96.2°F | Resp 16

## 2019-04-09 DIAGNOSIS — G5791 Unspecified mononeuropathy of right lower limb: Secondary | ICD-10-CM | POA: Diagnosis not present

## 2019-04-09 DIAGNOSIS — K047 Periapical abscess without sinus: Secondary | ICD-10-CM

## 2019-04-09 DIAGNOSIS — G5792 Unspecified mononeuropathy of left lower limb: Secondary | ICD-10-CM

## 2019-04-09 DIAGNOSIS — G5763 Lesion of plantar nerve, bilateral lower limbs: Secondary | ICD-10-CM

## 2019-04-09 DIAGNOSIS — M109 Gout, unspecified: Secondary | ICD-10-CM

## 2019-04-09 MED ORDER — AMOXICILLIN-POT CLAVULANATE 875-125 MG PO TABS: 1.0000 | ORAL_TABLET | Freq: Two times a day (BID) | ORAL | 0 refills | Status: DC

## 2019-04-09 NOTE — Patient Instructions (Addendum)
Please take the antibiotic as directed with a little something on the stomach. Tylenol and Anbesol (topically) for pain. The steroids you are on for your back should actually help with inflammation. Follow-up with your dentist if symptoms are not improving/resolving.    Dental Abscess  A dental abscess is an area of pus in or around a tooth. It comes from an infection. It can cause pain and other symptoms. Treatment will help with symptoms and prevent the infection from spreading. Follow these instructions at home: Medicines  Take over-the-counter and prescription medicines only as told by your dentist.  If you were prescribed an antibiotic medicine, take it as told by your dentist. Do not stop taking it even if you start to feel better.  If you were prescribed a gel that has numbing medicine in it, use it exactly as told.  Do not drive or use heavy machinery (like a Surveyor, mining) while taking prescription pain medicine. General instructions  Rinse out your mouth often with salt water. ? To make salt water, dissolve -1 tsp of salt in 1 cup of warm water.  Eat a soft diet while your mouth is healing.  Drink enough fluid to keep your urine pale yellow.  Do not apply heat to the outside of your mouth.  Do not use any products that contain nicotine or tobacco. These include cigarettes and e-cigarettes. If you need help quitting, ask your doctor.  Keep all follow-up visits as told by your dentist. This is important. Prevent an abscess  Brush your teeth every morning and every night. Use fluoride toothpaste.  Floss your teeth each day.  Get dental cleanings as often as told by your dentist.  Think about getting dental sealant put on teeth that have deep holes (decay).  Drink water that has fluoride in it. ? Most tap water has fluoride. ? Check the label on bottled water to see if it has fluoride in it.  Drink water instead of sugary drinks.  Eat healthy meals and  snacks.  Wear a mouth guard or face shield when you play sports. Contact a doctor if:  Your pain is worse, and medicine does not help. Get help right away if:  You have a fever or chills.  Your symptoms suddenly get worse.  You have a very bad headache.  You have problems breathing or swallowing.  You have trouble opening your mouth.  You have swelling in your neck or close to your eye. Summary  A dental abscess is an area of pus in or around a tooth. It is caused by an infection.  Treatment will help with symptoms and prevent the infection from spreading.  Take over-the-counter and prescription medicines only as told by your dentist.  To prevent an abscess, take good care of your teeth. Brush your teeth every morning and night. Use floss every day.  Get dental cleanings as often as told by your dentist. This information is not intended to replace advice given to you by your health care provider. Make sure you discuss any questions you have with your health care provider. Document Released: 03/07/2015 Document Revised: 06/23/2017 Document Reviewed: 06/23/2017 Elsevier Interactive Patient Education  2019 [employer].

## 2019-04-09 NOTE — Progress Notes (Signed)
I have discussed the procedure for the virtual visit with the patient who has given consent to proceed with assessment and treatment.   Regina Morales S Nikolaos Maddocks, CMA     

## 2019-04-09 NOTE — Progress Notes (Signed)
Virtual Visit via Video   I connected with patient on 04/09/19 at 10:20 AM EDT by a video enabled telemedicine application and verified that I am speaking with the correct person using two identifiers.  Location patient: Home [facility] provider: [facility] Persons participating in the virtual visit: Patient, Provider, CMA (Patina Moore)  I discussed the limitations of evaluation and management by telemedicine and the availability of in person appointments. The patient expressed understanding and agreed to proceed.  Subjective:   HPI:   Patient presents via Doxy.Me today for pain in tooth of lower mouth waking her up from sleep this morning. Notes poor dentition with multiple cavities. Is scheduled to see the dentist soon to get teeth pulled. Has upper dentures. Denies fever, chills. Denies injury to mouth or recent chipped tooth.  ROS:   See pertinent positives and negatives per HPI.  Patient Active Problem List   Diagnosis Date Noted  . Visit for monitoring Tikosyn therapy 09/15/2018  . 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 Tendinitis of right elbow 11/25/2017  . Abnormal nuclear stress test   . Essential hypertension   . Endometrial polyp   . Persistent atrial fibrillation   . Normocytic anemia 04/24/2014  . Depression 04/22/2014  . Migraine 04/22/2014  . PAF (paroxysmal atrial fibrillation) (HCC)   . GERD (gastroesophageal reflux disease)   . Atypical chest pain   . Obesity     Social History   Tobacco Use  . Smoking status: Former Smoker    Packs/day: 1.00    Years: 8.00    Pack years: 8.00    Types: Cigarettes    Last attempt to quit: 11/04/1990    Years since quitting: 28.4  . Smokeless tobacco: Never Used  Substance Use Topics  . Alcohol use: Not Currently    Current Outpatient Medications:  .  apixaban (ELIQUIS) 5 MG TABS tablet, TAKE 1 TABLET(5 MG) BY MOUTH TWICE DAILY (Patient taking differently: Take 5 mg by mouth 2 (two) times daily.  ), Disp: 180 tablet, Rfl: 1 .  diltiazem (CARDIZEM) 30 MG tablet, Take 1 tablet every 4 hours AS NEEDED for AFIB heart rate >100, Disp: 45 tablet, Rfl: 1 .  dofetilide (TIKOSYN) 500 MCG capsule, Take 1 capsule (500 mcg total) by mouth 2 (two) times daily., Disp: 180 capsule, Rfl: 2 .  furosemide (LASIX) 40 MG tablet, Take 1 tablet (40 mg total) by mouth daily., Disp: 15 tablet, Rfl: 0 .  gabapentin (NEURONTIN) 100 MG capsule, Take 1 tablet twice daily x 1 day, then increase to 1 tablet TID x 1 day, then increase to 1 tablet AM, 1 tab Noon and 3 tab each evening., Disp: 60 capsule, Rfl: 0 .  lidocaine (LIDODERM) 5 %, Place 1 patch onto the skin daily. Remove & Discard patch within 12 hours or as directed by MD, Disp: 10 patch, Rfl: 0 .  methocarbamol (ROBAXIN) 500 MG tablet, Take 1 tablet (500 mg total) by mouth 2 (two) times daily., Disp: 16 tablet, Rfl: 0 .  pantoprazole (PROTONIX) 40 MG tablet, Take 1 tablet (40 mg total) by mouth daily., Disp: 30 tablet, Rfl: 3 .  potassium chloride SA (K-DUR) 20 MEQ tablet, Take 1 tablet (20 mEq total) by mouth daily., Disp: 30 tablet, Rfl: 3 .  rizatriptan (MAXALT) 10 MG tablet, Take 10 mg by mouth as needed for migraine. May repeat in 2 hours if needed, Disp: , Rfl:  .  Topiramate ER (TROKENDI XR) 50 MG CP24, Take 50  mg by mouth daily., Disp: , Rfl:  .  atorvastatin (LIPITOR) 20 MG tablet, Take 1 tablet (20 mg total) by mouth daily at 6 PM. (Patient not taking: Reported on 04/09/2019), Disp: 30 tablet, Rfl: 9 .  metoprolol tartrate (LOPRESSOR) 50 MG tablet, Take 1 tablet (50 mg total) by mouth 2 (two) times daily., Disp: 180 tablet, Rfl: 3  Allergies  Allergen Reactions  . Codeine Nausea And Vomiting    Objective:   There were no vitals taken for this visit.  Patient is well-developed, well-nourished in no acute distress.  Resting comfortably in chair at home.  Head is normocephalic, atraumatic.  No labored breathing.  Speech is clear and coherent with  logical contest.  Patient is alert and oriented at baseline.   Assessment and Plan:   1. Dental abscess Symptoms concerning for this. She has very poor dentition even notable on limited examination. Start Augmentin BID x 10 days. Continue tylenol. Recommended orajel or Anbesol. Follow-up with Dentist.     Piedad Climes, PA-C 04/09/2019

## 2019-04-12 ENCOUNTER — Encounter: Payer: Self-pay | Admitting: Physician Assistant

## 2019-04-12 ENCOUNTER — Telehealth: Payer: Self-pay | Admitting: *Deleted

## 2019-04-12 NOTE — Progress Notes (Signed)
   HPI: 58 year old female presents the office today for follow up evaluation of bilateral foot pain. She states her pain has worsened. She states the left foot is worse than the right and has associated swelling. She describes the pain has aching and shooting. She has been elevating the feet, using the inserts and taking Tylenol for treatment. She reports injuring her back earlier in the week. Patient is here for further evaluation and treatment.   Past Medical History:  Diagnosis Date  . Anemia    hx years ago  . Buzzing in ear    "right; even after OR"  . Chest pain    a. 2009: neg MV  ([facility])  . Depression   . DVT (deep vein thrombosis) in pregnancy 1980's   LLE  . Fibromyalgia    "dx'd many many years ago; I haven't had any problems w/it" (04/15/2017)  . GERD (gastroesophageal reflux disease)   . Hepatic steatosis   . High cholesterol   . History of blood transfusion    "low count after appendix surgery" unsure # of units transfused  . Hypertension   . Migraines    "maybe once/2 months" (04/15/2017)  . Obesity   . OSA on CPAP   . Persistent atrial fibrillation    a. Dx 07/2013;  b. CHA2DS2VASc=1 (female);  c. 07/2013 Echo:  EF 50-55%, no rwma, mod LVH.  . Pulmonary embolism ([facility]) ~ 1984   "after I had my last child"  . SVD (spontaneous vaginal delivery)    x 4     Physical Exam: General: The patient is alert and oriented x3 in no acute distress.  Dermatology: Skin is warm, dry and supple bilateral lower extremities. Negative for open lesions or macerations.  Vascular: Palpable pedal pulses bilaterally. No edema or erythema noted. Capillary refill within normal limits.  Neurological: Epicritic and protective threshold grossly intact bilaterally.   Musculoskeletal Exam: Range of motion within normal limits to all pedal and ankle joints bilateral. Muscle strength 5/5 in all groups bilateral.  Pain on palpation to the second interspace of the left foot as well as the second  and third interspace of the right foot possibly consistent with Morton's neuroma. Pain with palpation noted to the 2nd MPJ of the bilateral feet.   Assessment: 1. Morton's metatarsalgia both feet 2. 2nd MPJ capsulitis bilaterally 3. Acute back injury 4. Generalized lower edema pain bilaterally   Plan of Care:  1. Patient evaluated. 2. Patient scheduled for MRI of back.  3. Explained that patient's pain is likely coming from the back.  4. Continue follow up with neurosurgeon and PCP.  5. Return to clinic as needed.   Does tech support for work.     Edrick Kins, DPM [facility]  Dr. Edrick Kins, DPM    [address], [place] [address]                Office [phone]  Fax [phone]

## 2019-04-12 NOTE — Telephone Encounter (Signed)
    COVID-19 Pre-Screening Questions:  . In the past 7 to 10 days have you had a cough,  shortness of breath, headache, congestion, fever (100 or greater) body aches, chills, sore throat, or sudden loss of taste or sense of smell? . Have you been around anyone with known Covid 19. . Have you been around anyone who is awaiting Covid 19 test results in the past 7 to 10 days? . Have you been around anyone who has been exposed to Covid 19, or has mentioned symptoms of Covid 19 within the past 7 to 10 days?  If you have any concerns/questions about symptoms patients report during screening (either on the phone or at threshold). Contact the provider seeing the patient or DOD for further guidance.  If neither are available contact a member of the leadership team.           Contacted patient via phone call. No to all Covid 19 questions. Has a mask. KB  

## 2019-04-14 ENCOUNTER — Other Ambulatory Visit: Payer: BC Managed Care – PPO

## 2019-04-14 ENCOUNTER — Other Ambulatory Visit: Payer: Self-pay | Admitting: Neurosurgery

## 2019-04-14 ENCOUNTER — Encounter: Payer: Self-pay | Admitting: Physician Assistant

## 2019-04-14 DIAGNOSIS — G629 Polyneuropathy, unspecified: Secondary | ICD-10-CM | POA: Insufficient documentation

## 2019-04-14 DIAGNOSIS — M5440 Lumbago with sciatica, unspecified side: Secondary | ICD-10-CM

## 2019-04-14 MED ORDER — PREGABALIN 50 MG PO CAPS: ORAL_CAPSULE | ORAL | 0 refills | Status: DC

## 2019-04-16 ENCOUNTER — Telehealth: Payer: Self-pay | Admitting: Physician Assistant

## 2019-04-16 NOTE — Telephone Encounter (Signed)
Can send the medica record request to HIM and they will get all the records to [facility]. I did not take her out on disability so I will not complete the forms. Those are for the provider that wrote her out (specialist)

## 2019-04-16 NOTE — Telephone Encounter (Signed)
I have placed forms from [employer] claims in the bin up front with a charge sheet.

## 2019-04-16 NOTE — Telephone Encounter (Signed)
Scanned document to Danae Chen to email to HIM department for records request

## 2019-04-16 NOTE — Telephone Encounter (Signed)
Sent to the ROI dept. By email

## 2019-04-16 NOTE — Telephone Encounter (Signed)
Paperwork in your folder for review 

## 2019-04-27 ENCOUNTER — Encounter: Payer: Self-pay | Admitting: Physician Assistant

## 2019-04-27 ENCOUNTER — Telehealth: Payer: Self-pay | Admitting: Physician Assistant

## 2019-04-27 NOTE — Telephone Encounter (Signed)
I have placed FMLA forms from [employer] in the bin upfront with a charge sheet

## 2019-04-27 NOTE — Telephone Encounter (Signed)
Notes printed and placed in fax bin in nursing station for pickup and fax by office staff

## 2019-04-27 NOTE — Telephone Encounter (Signed)
Spoke with patient about FMLA paperwork.  Patient states due to her being out on FMLA by Dr Amalia Hailey. [place] request records from all her doctors when she has an appointment. They are pertaining to the 04/06/19 appointment.  She has been out on FMLA since 03/27/19. Her potential return back to work date is 06/08/19 but the MRI is pending and has a follow up appointment on 05/06/19 with Neurologist.

## 2019-04-28 NOTE — Telephone Encounter (Signed)
Picked up from back, faxed, and sent to scan

## 2019-04-29 DIAGNOSIS — Z1159 Encounter for screening for other viral diseases: Secondary | ICD-10-CM | POA: Diagnosis not present

## 2019-04-29 DIAGNOSIS — F330 Major depressive disorder, recurrent, mild: Secondary | ICD-10-CM | POA: Diagnosis not present

## 2019-04-29 DIAGNOSIS — M48061 Spinal stenosis, lumbar region without neurogenic claudication: Secondary | ICD-10-CM | POA: Diagnosis not present

## 2019-04-29 DIAGNOSIS — R279 Unspecified lack of coordination: Secondary | ICD-10-CM | POA: Diagnosis not present

## 2019-04-29 DIAGNOSIS — G629 Polyneuropathy, unspecified: Secondary | ICD-10-CM | POA: Diagnosis not present

## 2019-04-29 DIAGNOSIS — Q820 Hereditary lymphedema: Secondary | ICD-10-CM | POA: Diagnosis not present

## 2019-04-29 DIAGNOSIS — Z6841 Body Mass Index (BMI) 40.0 and over, adult: Secondary | ICD-10-CM | POA: Diagnosis not present

## 2019-04-29 DIAGNOSIS — N281 Cyst of kidney, acquired: Secondary | ICD-10-CM | POA: Diagnosis not present

## 2019-04-29 DIAGNOSIS — F332 Major depressive disorder, recurrent severe without psychotic features: Secondary | ICD-10-CM | POA: Diagnosis not present

## 2019-04-29 DIAGNOSIS — I480 Paroxysmal atrial fibrillation: Secondary | ICD-10-CM | POA: Diagnosis not present

## 2019-04-29 DIAGNOSIS — R262 Difficulty in walking, not elsewhere classified: Secondary | ICD-10-CM | POA: Diagnosis not present

## 2019-04-29 DIAGNOSIS — M79672 Pain in left foot: Secondary | ICD-10-CM | POA: Diagnosis not present

## 2019-04-29 DIAGNOSIS — G8929 Other chronic pain: Secondary | ICD-10-CM | POA: Diagnosis not present

## 2019-04-29 DIAGNOSIS — R600 Localized edema: Secondary | ICD-10-CM | POA: Diagnosis not present

## 2019-04-29 DIAGNOSIS — N189 Chronic kidney disease, unspecified: Secondary | ICD-10-CM | POA: Diagnosis not present

## 2019-04-29 DIAGNOSIS — Z743 Need for continuous supervision: Secondary | ICD-10-CM | POA: Diagnosis not present

## 2019-04-29 DIAGNOSIS — M4807 Spinal stenosis, lumbosacral region: Secondary | ICD-10-CM | POA: Diagnosis not present

## 2019-04-29 DIAGNOSIS — M545 Low back pain: Secondary | ICD-10-CM | POA: Diagnosis not present

## 2019-04-29 DIAGNOSIS — I10 Essential (primary) hypertension: Secondary | ICD-10-CM | POA: Diagnosis not present

## 2019-04-29 DIAGNOSIS — I872 Venous insufficiency (chronic) (peripheral): Secondary | ICD-10-CM | POA: Diagnosis not present

## 2019-04-29 DIAGNOSIS — K5900 Constipation, unspecified: Secondary | ICD-10-CM | POA: Diagnosis not present

## 2019-04-29 DIAGNOSIS — M797 Fibromyalgia: Secondary | ICD-10-CM | POA: Diagnosis not present

## 2019-04-29 DIAGNOSIS — E876 Hypokalemia: Secondary | ICD-10-CM | POA: Diagnosis not present

## 2019-04-29 DIAGNOSIS — M1990 Unspecified osteoarthritis, unspecified site: Secondary | ICD-10-CM | POA: Diagnosis not present

## 2019-04-29 DIAGNOSIS — M5136 Other intervertebral disc degeneration, lumbar region: Secondary | ICD-10-CM | POA: Diagnosis not present

## 2019-04-29 DIAGNOSIS — M47816 Spondylosis without myelopathy or radiculopathy, lumbar region: Secondary | ICD-10-CM | POA: Diagnosis not present

## 2019-04-29 DIAGNOSIS — F419 Anxiety disorder, unspecified: Secondary | ICD-10-CM | POA: Diagnosis not present

## 2019-04-29 DIAGNOSIS — E785 Hyperlipidemia, unspecified: Secondary | ICD-10-CM | POA: Diagnosis not present

## 2019-04-29 DIAGNOSIS — M7989 Other specified soft tissue disorders: Secondary | ICD-10-CM | POA: Diagnosis not present

## 2019-04-29 DIAGNOSIS — I2119 ST elevation (STEMI) myocardial infarction involving other coronary artery of inferior wall: Secondary | ICD-10-CM | POA: Diagnosis not present

## 2019-04-29 DIAGNOSIS — M479 Spondylosis, unspecified: Secondary | ICD-10-CM | POA: Diagnosis not present

## 2019-04-29 DIAGNOSIS — R0601 Orthopnea: Secondary | ICD-10-CM | POA: Diagnosis not present

## 2019-04-29 DIAGNOSIS — R609 Edema, unspecified: Secondary | ICD-10-CM | POA: Diagnosis not present

## 2019-04-29 DIAGNOSIS — F329 Major depressive disorder, single episode, unspecified: Secondary | ICD-10-CM | POA: Diagnosis not present

## 2019-04-29 DIAGNOSIS — M79671 Pain in right foot: Secondary | ICD-10-CM | POA: Diagnosis not present

## 2019-04-30 MED ORDER — TOPIRAMATE 100 MG PO TABS
100.00 | ORAL_TABLET | ORAL | Status: DC
Start: 2019-04-30 — End: 2019-04-30

## 2019-04-30 MED ORDER — BISACODYL 5 MG PO TBEC
10.00 | DELAYED_RELEASE_TABLET | ORAL | Status: DC
Start: ? — End: 2019-04-30

## 2019-04-30 MED ORDER — PREGABALIN 50 MG PO CAPS
50.00 | ORAL_CAPSULE | ORAL | Status: DC
Start: 2019-04-30 — End: 2019-04-30

## 2019-04-30 MED ORDER — VITAMIN D (ERGOCALCIFEROL) 1.25 MG (50000 UT) PO CAPS
50000.00 | ORAL_CAPSULE | ORAL | Status: DC
Start: 2019-05-01 — End: 2019-04-30

## 2019-04-30 MED ORDER — SORBITOL 70 % PO SOLN
30.00 | ORAL | Status: DC
Start: ? — End: 2019-04-30

## 2019-04-30 MED ORDER — ONDANSETRON 4 MG PO TBDP
4.00 | ORAL_TABLET | ORAL | Status: DC
Start: ? — End: 2019-04-30

## 2019-04-30 MED ORDER — CLONIDINE HCL 0.1 MG PO TABS
0.10 | ORAL_TABLET | ORAL | Status: DC
Start: ? — End: 2019-04-30

## 2019-04-30 MED ORDER — FUROSEMIDE 20 MG PO TABS: 20.0000 mg | ORAL_TABLET | Freq: Every day | ORAL | 0 refills | Status: DC

## 2019-04-30 MED ORDER — ACETAMINOPHEN 325 MG PO TABS
650.00 | ORAL_TABLET | ORAL | Status: DC
Start: ? — End: 2019-04-30

## 2019-04-30 MED ORDER — GUAIFENESIN-DM 100-10 MG/5ML PO SYRP
5.00 | ORAL_SOLUTION | ORAL | Status: DC
Start: ? — End: 2019-04-30

## 2019-04-30 MED ORDER — PANTOPRAZOLE SODIUM 40 MG PO TBEC
40.00 | DELAYED_RELEASE_TABLET | ORAL | Status: DC
Start: 2019-04-30 — End: 2019-04-30

## 2019-04-30 MED ORDER — ZOLPIDEM TARTRATE 5 MG PO TABS
5.00 | ORAL_TABLET | ORAL | Status: DC
Start: ? — End: 2019-04-30

## 2019-04-30 MED ORDER — DOFETILIDE 500 MCG PO CAPS
500.00 | ORAL_CAPSULE | ORAL | Status: DC
Start: 2019-04-30 — End: 2019-04-30

## 2019-04-30 MED ORDER — ONDANSETRON HCL 4 MG/2ML IJ SOLN
4.00 | INTRAMUSCULAR | Status: DC
Start: ? — End: 2019-04-30

## 2019-04-30 MED ORDER — APIXABAN 5 MG PO TABS
5.00 | ORAL_TABLET | ORAL | Status: DC
Start: 2019-04-30 — End: 2019-04-30

## 2019-04-30 MED ORDER — ALUMINUM-MAGNESIUM-SIMETHICONE 200-200-20 MG/5ML PO SUSP
30.00 | ORAL | Status: DC
Start: ? — End: 2019-04-30

## 2019-04-30 MED ORDER — TIZANIDINE HCL 4 MG PO TABS
4.00 | ORAL_TABLET | ORAL | Status: DC
Start: ? — End: 2019-04-30

## 2019-04-30 MED ORDER — METOPROLOL TARTRATE 50 MG PO TABS
50.00 | ORAL_TABLET | ORAL | Status: DC
Start: 2019-04-30 — End: 2019-04-30

## 2019-04-30 MED ORDER — TRAMADOL HCL 50 MG PO TABS
50.00 | ORAL_TABLET | ORAL | Status: DC
Start: ? — End: 2019-04-30

## 2019-05-03 DIAGNOSIS — M797 Fibromyalgia: Secondary | ICD-10-CM | POA: Insufficient documentation

## 2019-05-09 DIAGNOSIS — E876 Hypokalemia: Secondary | ICD-10-CM | POA: Diagnosis not present

## 2019-05-09 DIAGNOSIS — R32 Unspecified urinary incontinence: Secondary | ICD-10-CM | POA: Diagnosis not present

## 2019-05-09 DIAGNOSIS — G629 Polyneuropathy, unspecified: Secondary | ICD-10-CM | POA: Diagnosis not present

## 2019-05-09 DIAGNOSIS — J441 Chronic obstructive pulmonary disease with (acute) exacerbation: Secondary | ICD-10-CM | POA: Diagnosis not present

## 2019-05-09 DIAGNOSIS — G603 Idiopathic progressive neuropathy: Secondary | ICD-10-CM | POA: Diagnosis not present

## 2019-05-09 DIAGNOSIS — I4891 Unspecified atrial fibrillation: Secondary | ICD-10-CM | POA: Diagnosis not present

## 2019-05-09 DIAGNOSIS — J449 Chronic obstructive pulmonary disease, unspecified: Secondary | ICD-10-CM | POA: Diagnosis not present

## 2019-05-09 DIAGNOSIS — M545 Low back pain: Secondary | ICD-10-CM | POA: Diagnosis not present

## 2019-05-09 DIAGNOSIS — G4700 Insomnia, unspecified: Secondary | ICD-10-CM | POA: Diagnosis not present

## 2019-05-09 DIAGNOSIS — F332 Major depressive disorder, recurrent severe without psychotic features: Secondary | ICD-10-CM | POA: Diagnosis not present

## 2019-05-09 DIAGNOSIS — I480 Paroxysmal atrial fibrillation: Secondary | ICD-10-CM | POA: Diagnosis not present

## 2019-05-09 DIAGNOSIS — N390 Urinary tract infection, site not specified: Secondary | ICD-10-CM | POA: Diagnosis not present

## 2019-05-09 DIAGNOSIS — G8929 Other chronic pain: Secondary | ICD-10-CM | POA: Diagnosis not present

## 2019-05-09 DIAGNOSIS — Z743 Need for continuous supervision: Secondary | ICD-10-CM | POA: Diagnosis not present

## 2019-05-09 DIAGNOSIS — M797 Fibromyalgia: Secondary | ICD-10-CM | POA: Diagnosis not present

## 2019-05-09 DIAGNOSIS — M1990 Unspecified osteoarthritis, unspecified site: Secondary | ICD-10-CM | POA: Diagnosis not present

## 2019-05-09 DIAGNOSIS — R279 Unspecified lack of coordination: Secondary | ICD-10-CM | POA: Diagnosis not present

## 2019-05-09 DIAGNOSIS — G43909 Migraine, unspecified, not intractable, without status migrainosus: Secondary | ICD-10-CM | POA: Diagnosis not present

## 2019-05-09 DIAGNOSIS — G4733 Obstructive sleep apnea (adult) (pediatric): Secondary | ICD-10-CM | POA: Diagnosis not present

## 2019-05-09 DIAGNOSIS — M5417 Radiculopathy, lumbosacral region: Secondary | ICD-10-CM | POA: Diagnosis not present

## 2019-05-09 DIAGNOSIS — F329 Major depressive disorder, single episode, unspecified: Secondary | ICD-10-CM | POA: Diagnosis not present

## 2019-05-09 DIAGNOSIS — R0600 Dyspnea, unspecified: Secondary | ICD-10-CM | POA: Diagnosis not present

## 2019-05-09 DIAGNOSIS — F319 Bipolar disorder, unspecified: Secondary | ICD-10-CM | POA: Diagnosis not present

## 2019-05-09 DIAGNOSIS — Z20828 Contact with and (suspected) exposure to other viral communicable diseases: Secondary | ICD-10-CM | POA: Diagnosis not present

## 2019-05-09 DIAGNOSIS — Z87891 Personal history of nicotine dependence: Secondary | ICD-10-CM | POA: Diagnosis not present

## 2019-05-09 DIAGNOSIS — Z79899 Other long term (current) drug therapy: Secondary | ICD-10-CM | POA: Diagnosis not present

## 2019-05-09 DIAGNOSIS — J189 Pneumonia, unspecified organism: Secondary | ICD-10-CM | POA: Diagnosis not present

## 2019-05-09 DIAGNOSIS — J984 Other disorders of lung: Secondary | ICD-10-CM | POA: Diagnosis not present

## 2019-05-09 DIAGNOSIS — I509 Heart failure, unspecified: Secondary | ICD-10-CM | POA: Diagnosis not present

## 2019-05-09 DIAGNOSIS — Z7901 Long term (current) use of anticoagulants: Secondary | ICD-10-CM | POA: Diagnosis not present

## 2019-05-09 DIAGNOSIS — Q820 Hereditary lymphedema: Secondary | ICD-10-CM | POA: Diagnosis not present

## 2019-05-09 DIAGNOSIS — R400 Somnolence: Secondary | ICD-10-CM | POA: Diagnosis not present

## 2019-05-09 DIAGNOSIS — R0602 Shortness of breath: Secondary | ICD-10-CM | POA: Diagnosis not present

## 2019-05-09 DIAGNOSIS — T887XXA Unspecified adverse effect of drug or medicament, initial encounter: Secondary | ICD-10-CM | POA: Diagnosis not present

## 2019-05-09 DIAGNOSIS — M4807 Spinal stenosis, lumbosacral region: Secondary | ICD-10-CM | POA: Diagnosis not present

## 2019-05-09 DIAGNOSIS — R262 Difficulty in walking, not elsewhere classified: Secondary | ICD-10-CM | POA: Diagnosis not present

## 2019-05-09 DIAGNOSIS — M5136 Other intervertebral disc degeneration, lumbar region: Secondary | ICD-10-CM | POA: Diagnosis not present

## 2019-05-09 DIAGNOSIS — R300 Dysuria: Secondary | ICD-10-CM | POA: Diagnosis not present

## 2019-05-09 DIAGNOSIS — L299 Pruritus, unspecified: Secondary | ICD-10-CM | POA: Diagnosis not present

## 2019-05-09 DIAGNOSIS — R05 Cough: Secondary | ICD-10-CM | POA: Diagnosis not present

## 2019-05-09 DIAGNOSIS — G3184 Mild cognitive impairment, so stated: Secondary | ICD-10-CM | POA: Diagnosis not present

## 2019-05-09 DIAGNOSIS — I872 Venous insufficiency (chronic) (peripheral): Secondary | ICD-10-CM | POA: Diagnosis not present

## 2019-05-09 DIAGNOSIS — J42 Unspecified chronic bronchitis: Secondary | ICD-10-CM | POA: Diagnosis not present

## 2019-05-09 DIAGNOSIS — R609 Edema, unspecified: Secondary | ICD-10-CM | POA: Diagnosis not present

## 2019-05-10 DIAGNOSIS — R262 Difficulty in walking, not elsewhere classified: Secondary | ICD-10-CM | POA: Diagnosis not present

## 2019-05-10 DIAGNOSIS — M5136 Other intervertebral disc degeneration, lumbar region: Secondary | ICD-10-CM | POA: Diagnosis not present

## 2019-05-10 DIAGNOSIS — G8929 Other chronic pain: Secondary | ICD-10-CM | POA: Diagnosis not present

## 2019-05-10 DIAGNOSIS — M545 Low back pain: Secondary | ICD-10-CM | POA: Diagnosis not present

## 2019-05-11 ENCOUNTER — Other Ambulatory Visit: Payer: BC Managed Care – PPO

## 2019-05-18 ENCOUNTER — Inpatient Hospital Stay: Payer: BC Managed Care – PPO | Admitting: Physician Assistant

## 2019-05-18 DIAGNOSIS — G8929 Other chronic pain: Secondary | ICD-10-CM | POA: Diagnosis not present

## 2019-05-18 DIAGNOSIS — M545 Low back pain: Secondary | ICD-10-CM | POA: Diagnosis not present

## 2019-05-18 DIAGNOSIS — M5136 Other intervertebral disc degeneration, lumbar region: Secondary | ICD-10-CM | POA: Diagnosis not present

## 2019-05-18 DIAGNOSIS — R262 Difficulty in walking, not elsewhere classified: Secondary | ICD-10-CM | POA: Diagnosis not present

## 2019-05-19 DIAGNOSIS — M5136 Other intervertebral disc degeneration, lumbar region: Secondary | ICD-10-CM | POA: Diagnosis not present

## 2019-05-19 DIAGNOSIS — G8929 Other chronic pain: Secondary | ICD-10-CM | POA: Diagnosis not present

## 2019-05-19 DIAGNOSIS — R262 Difficulty in walking, not elsewhere classified: Secondary | ICD-10-CM | POA: Diagnosis not present

## 2019-05-19 DIAGNOSIS — M545 Low back pain: Secondary | ICD-10-CM | POA: Diagnosis not present

## 2019-05-25 DIAGNOSIS — F332 Major depressive disorder, recurrent severe without psychotic features: Secondary | ICD-10-CM | POA: Diagnosis not present

## 2019-05-26 DIAGNOSIS — G8929 Other chronic pain: Secondary | ICD-10-CM | POA: Diagnosis not present

## 2019-05-26 DIAGNOSIS — M545 Low back pain: Secondary | ICD-10-CM | POA: Diagnosis not present

## 2019-05-26 DIAGNOSIS — M5136 Other intervertebral disc degeneration, lumbar region: Secondary | ICD-10-CM | POA: Diagnosis not present

## 2019-05-27 ENCOUNTER — Other Ambulatory Visit: Payer: Self-pay | Admitting: Physician Assistant

## 2019-06-02 DIAGNOSIS — M5136 Other intervertebral disc degeneration, lumbar region: Secondary | ICD-10-CM | POA: Diagnosis not present

## 2019-06-02 DIAGNOSIS — M797 Fibromyalgia: Secondary | ICD-10-CM | POA: Diagnosis not present

## 2019-06-02 DIAGNOSIS — M545 Low back pain: Secondary | ICD-10-CM | POA: Diagnosis not present

## 2019-06-02 DIAGNOSIS — G8929 Other chronic pain: Secondary | ICD-10-CM | POA: Diagnosis not present

## 2019-06-03 DIAGNOSIS — R0600 Dyspnea, unspecified: Secondary | ICD-10-CM | POA: Diagnosis not present

## 2019-06-03 DIAGNOSIS — I480 Paroxysmal atrial fibrillation: Secondary | ICD-10-CM | POA: Diagnosis not present

## 2019-06-03 DIAGNOSIS — R0602 Shortness of breath: Secondary | ICD-10-CM | POA: Diagnosis not present

## 2019-06-03 DIAGNOSIS — R400 Somnolence: Secondary | ICD-10-CM | POA: Diagnosis not present

## 2019-06-03 DIAGNOSIS — Z87891 Personal history of nicotine dependence: Secondary | ICD-10-CM | POA: Diagnosis not present

## 2019-06-03 DIAGNOSIS — J42 Unspecified chronic bronchitis: Secondary | ICD-10-CM | POA: Diagnosis not present

## 2019-06-14 DIAGNOSIS — R0602 Shortness of breath: Secondary | ICD-10-CM | POA: Diagnosis not present

## 2019-06-14 DIAGNOSIS — R0600 Dyspnea, unspecified: Secondary | ICD-10-CM | POA: Diagnosis not present

## 2019-06-16 DIAGNOSIS — G4700 Insomnia, unspecified: Secondary | ICD-10-CM | POA: Diagnosis not present

## 2019-06-16 DIAGNOSIS — M545 Low back pain: Secondary | ICD-10-CM | POA: Diagnosis not present

## 2019-06-16 DIAGNOSIS — G8929 Other chronic pain: Secondary | ICD-10-CM | POA: Diagnosis not present

## 2019-06-16 DIAGNOSIS — F319 Bipolar disorder, unspecified: Secondary | ICD-10-CM | POA: Diagnosis not present

## 2019-06-17 DIAGNOSIS — J984 Other disorders of lung: Secondary | ICD-10-CM | POA: Diagnosis not present

## 2019-06-17 DIAGNOSIS — Z87891 Personal history of nicotine dependence: Secondary | ICD-10-CM | POA: Diagnosis not present

## 2019-06-17 DIAGNOSIS — J449 Chronic obstructive pulmonary disease, unspecified: Secondary | ICD-10-CM | POA: Diagnosis not present

## 2019-06-21 DIAGNOSIS — M5136 Other intervertebral disc degeneration, lumbar region: Secondary | ICD-10-CM | POA: Diagnosis not present

## 2019-06-21 DIAGNOSIS — R32 Unspecified urinary incontinence: Secondary | ICD-10-CM | POA: Diagnosis not present

## 2019-06-21 DIAGNOSIS — R300 Dysuria: Secondary | ICD-10-CM | POA: Diagnosis not present

## 2019-06-23 DIAGNOSIS — F319 Bipolar disorder, unspecified: Secondary | ICD-10-CM | POA: Diagnosis not present

## 2019-06-23 DIAGNOSIS — G4700 Insomnia, unspecified: Secondary | ICD-10-CM | POA: Diagnosis not present

## 2019-06-23 DIAGNOSIS — M5136 Other intervertebral disc degeneration, lumbar region: Secondary | ICD-10-CM | POA: Diagnosis not present

## 2019-06-23 DIAGNOSIS — R300 Dysuria: Secondary | ICD-10-CM | POA: Diagnosis not present

## 2019-06-23 DIAGNOSIS — R32 Unspecified urinary incontinence: Secondary | ICD-10-CM | POA: Diagnosis not present

## 2019-06-24 DIAGNOSIS — R400 Somnolence: Secondary | ICD-10-CM | POA: Diagnosis not present

## 2019-06-24 DIAGNOSIS — J449 Chronic obstructive pulmonary disease, unspecified: Secondary | ICD-10-CM | POA: Insufficient documentation

## 2019-06-24 DIAGNOSIS — R0600 Dyspnea, unspecified: Secondary | ICD-10-CM | POA: Diagnosis not present

## 2019-06-24 DIAGNOSIS — I480 Paroxysmal atrial fibrillation: Secondary | ICD-10-CM | POA: Diagnosis not present

## 2019-06-26 ENCOUNTER — Other Ambulatory Visit: Payer: Self-pay | Admitting: Cardiovascular Disease

## 2019-06-28 NOTE — Telephone Encounter (Signed)
Eliquis 5mg  refill request received; pt is 58 yrs old, wt-147.9kg, Crea-0.70 on 03/22/2019, last seen by Dr. Curt Bears on 04/02/2019, Diagnosis-Afib; will send in refill.

## 2019-06-29 ENCOUNTER — Telehealth: Payer: Self-pay

## 2019-06-29 NOTE — Telephone Encounter (Addendum)
**Note De-Identified  Obfuscation** Message from covermymeds:  Regina Morales Key: [id number] - PA Case ID: [id number] - Rx #: [id number]  Outcome  Approved today  Case [id number]; Status:Approved; Review Type:Prior Auth Coverage Start Date:05/30/2019;Coverage End Date:06/28/2020  I have notified the pts pharmacy of this approval.

## 2019-06-29 NOTE — Telephone Encounter (Signed)
**Note De-Identified  Obfuscation** I started an Eliquis PA through covermymeds. Key: [id number]

## 2019-07-01 ENCOUNTER — Telehealth: Payer: Self-pay | Admitting: Cardiology

## 2019-07-01 NOTE — Telephone Encounter (Signed)
lmtcb

## 2019-07-01 NOTE — Telephone Encounter (Signed)
New Message  Patient states that she is in a rehab center and will not be able to do an in office visit on 07/06/19 with Dr. Curt Bears. Patient would like to know if already scheduled appointment can be changed to a virtual visit. Please give patient a call back to confirm.

## 2019-07-02 NOTE — Telephone Encounter (Signed)
Pt aware that we can change this to a virtual appt.   Informed of virtual visit instructions and what to expect.  Telemedicine consent obtained. Pt aware we may have to see her again in 3 months for an EKG. Also aware that she is overdue for a Magnesium and we will probably send an order to the facility for them to draw. Patient verbalized understanding and agreeable to plan.       Virtual Visit Pre-Appointment Phone Call  "(Name), I am calling you today to discuss your upcoming appointment. We are currently trying to limit exposure to the virus that causes COVID-19 by seeing patients at home rather than in the office."  1. "What is the BEST phone number to call the day of the visit?" - include this in appointment notes  2. "Do you have or have access to (through a family member/friend) a smartphone with video capability that we can use for your visit?" a. If yes - list this number in appt notes as "cell" (if different from BEST phone #) and list the appointment type as a VIDEO visit in appointment notes b. If no - list the appointment type as a PHONE visit in appointment notes  3. Confirm consent - "In the setting of the current Covid19 crisis, you are scheduled for a (phone or video) visit with your provider on (date) at (time).  Just as we do with many in-office visits, in order for you to participate in this visit, we must obtain consent.  If you'd like, I can send this to your mychart (if signed up) or email for you to review.  Otherwise, I can obtain your verbal consent now.  All virtual visits are billed to your insurance company just like a normal visit would be.  By agreeing to a virtual visit, we'd like you to understand that the technology does not allow for your provider to perform an examination, and thus may limit your provider's ability to fully assess your condition. If your provider identifies any concerns that need to be evaluated in person, we will make arrangements to do so.   Finally, though the technology is pretty good, we cannot assure that it will always work on either your or our end, and in the setting of a video visit, we may have to convert it to a phone-only visit.  In either situation, we cannot ensure that we have a secure connection.  Are you willing to proceed?" STAFF: Did the patient verbally acknowledge consent to telehealth visit? Document YES/NO here: YES  4. Advise patient to be prepared - "Two hours prior to your appointment, go ahead and check your blood pressure, pulse, oxygen saturation, and your weight (if you have the equipment to check those) and write them all down. When your visit starts, your provider will ask you for this information. If you have an Apple Watch or Kardia device, please plan to have heart rate information ready on the day of your appointment. Please have a pen and paper handy nearby the day of the visit as well."  5. Give patient instructions for MyChart download to smartphone OR Doximity/Doxy.me as below if video visit (depending on what platform provider is using)  6. Inform patient they will receive a phone call 15 minutes prior to their appointment time (may be from unknown caller ID) so they should be prepared to answer    TELEPHONE CALL NOTE  Regina Morales has been deemed a candidate for a follow-up tele-health visit  to limit community exposure during the Covid-19 pandemic. I spoke with the patient via phone to ensure availability of phone/video source, confirm preferred email & phone number, and discuss instructions and expectations.  I reminded Regina Morales to be prepared with any vital sign and/or heart rhythm information that could potentially be obtained via home monitoring, at the time of her visit. I reminded Regina Morales to expect a phone call prior to her visit.  Baird LyonsPrice, Wilmary Levit L, RN 07/02/2019 10:31 AM   INSTRUCTIONS FOR DOWNLOADING THE MYCHART APP TO SMARTPHONE  - The patient must first make sure  to have activated MyChart and know their login information - If Apple, go to [employer] Store and type in MyChart in the search bar and download the app. If Android, ask patient to go to [employer] Play Store and type in [facility]Chart in the search bar and download the app. The app is free but as with any other app downloads, their phone may require them to verify saved payment information or Apple/Android password.  - The patient will need to then log into the app with their MyChart username and password, and select [facility] as their healthcare provider to link the account. When it is time for your visit, go to the MyChart app, find appointments, and click Begin Video Visit. Be sure to Select Allow for your device to access the Microphone and Camera for your visit. You will then be connected, and your provider will be with you shortly.  **If they have any issues connecting, or need assistance please contact MyChart service desk [phone])**  **If using a computer, in order to ensure the best quality for their visit they will need to use either of the following Internet Browsers: [employer] Edge, or Google Chrome**  IF USING DOXIMITY or DOXY.ME - The patient will receive a link just prior to their visit by text.     FULL LENGTH CONSENT FOR TELE-HEALTH VISIT   I hereby voluntarily request, consent and authorize [facility] and its employed or contracted physicians, physician assistants, nurse practitioners or other licensed health care professionals (the Practitioner), to provide me with telemedicine health care services (the "Services") as deemed necessary by the treating Practitioner. I acknowledge and consent to receive the Services by the Practitioner via telemedicine. I understand that the telemedicine visit will involve communicating with the Practitioner through live audiovisual communication technology and the disclosure of certain medical information by electronic transmission. I  acknowledge that I have been given the opportunity to request an in-person assessment or other available alternative prior to the telemedicine visit and am voluntarily participating in the telemedicine visit.  I understand that I have the right to withhold or withdraw my consent to the use of telemedicine in the course of my care at any time, without affecting my right to future care or treatment, and that the Practitioner or I may terminate the telemedicine visit at any time. I understand that I have the right to inspect all information obtained and/or recorded in the course of the telemedicine visit and may receive copies of available information for a reasonable fee.  I understand that some of the potential risks of receiving the Services via telemedicine include:  Marland Kitchen. Delay or interruption in medical evaluation due to technological equipment failure or disruption; . Information transmitted may not be sufficient (e.g. poor resolution of images) to allow for appropriate medical decision making by the Practitioner; and/or  . In rare instances, security protocols could fail, causing  a breach of personal health information.  Furthermore, I acknowledge that it is my responsibility to provide information about my medical history, conditions and care that is complete and accurate to the best of my ability. I acknowledge that Practitioner's advice, recommendations, and/or decision may be based on factors not within their control, such as incomplete or inaccurate data provided by me or distortions of diagnostic images or specimens that may result from electronic transmissions. I understand that the practice of medicine is not an exact science and that Practitioner makes no warranties or guarantees regarding treatment outcomes. I acknowledge that I will receive a copy of this consent concurrently upon execution via email to the email address I last provided but may also request a printed copy by calling the office of  [facility].    I understand that my insurance will be billed for this visit.   I have read or had this consent read to me. . I understand the contents of this consent, which adequately explains the benefits and risks of the Services being provided via telemedicine.  . I have been provided ample opportunity to ask questions regarding this consent and the Services and have had my questions answered to my satisfaction. . I give my informed consent for the services to be provided through the use of telemedicine in my medical care  By participating in this telemedicine visit I agree to the above.

## 2019-07-06 ENCOUNTER — Other Ambulatory Visit: Payer: Self-pay

## 2019-07-06 ENCOUNTER — Telehealth (INDEPENDENT_AMBULATORY_CARE_PROVIDER_SITE_OTHER): Payer: BC Managed Care – PPO | Admitting: Cardiology

## 2019-07-06 DIAGNOSIS — I480 Paroxysmal atrial fibrillation: Secondary | ICD-10-CM

## 2019-07-06 NOTE — Progress Notes (Signed)
Electrophysiology TeleHealth Note   Due to national recommendations of social distancing due to COVID 19, an audio/video telehealth visit is felt to be most appropriate for this patient at this time.  See Epic message for the patient's consent to telehealth for [facility].   Date:  07/06/2019   ID:  Regina Morales, DOB 08-10-1961, MRN [id number]  Location: patient's home  Provider location: [address] [place]  Evaluation Performed: Follow-up visit  PCP:  Brunetta Jeans, PA-C  Cardiologist:  Jenkins Rouge, MD  Electrophysiologist:  Dr Curt Bears  Chief Complaint:  AF  History of Present Illness:    Regina Morales is a 58 y.o. female who presents via audio/video conferencing for a telehealth visit today.  Since last being seen in our clinic, the patient reports doing very well.  Today, she denies symptoms of palpitations, chest pain, shortness of breath,  lower extremity edema, dizziness, presyncope, or syncope.  The patient is otherwise without complaint today.  The patient denies symptoms of fevers, chills, cough, or new SOB worrisome for COVID 19.  She has history of paroxysmal atrial fibrillation, hypertension, OSA on CPAP.  She was loaded on dofetilide.  She is remained in sinus rhythm since dofetilide loading.  Today, denies symptoms of palpitations, chest pain, shortness of breath, orthopnea, PND, lower extremity edema, claudication, dizziness, presyncope, syncope, bleeding, or neurologic sequela. The patient is tolerating medications without difficulties. She currently feels well without complaint. She is undergoing rehab without issues. She was recently diagnosed with stage III COPD.   Past Medical History:  Diagnosis Date  . Anemia    hx years ago  . Buzzing in ear    "right; even after OR"  . Chest pain    a. 2009: neg MV  ([facility])  . Depression   . DVT (deep vein thrombosis) in pregnancy 1980's   LLE  . Fibromyalgia    "dx'd many many years  ago; I haven't had any problems w/it" (04/15/2017)  . GERD (gastroesophageal reflux disease)   . Hepatic steatosis   . High cholesterol   . History of blood transfusion    "low count after appendix surgery" unsure # of units transfused  . Hypertension   . Migraines    "maybe once/2 months" (04/15/2017)  . Obesity   . OSA on CPAP   . Persistent atrial fibrillation    a. Dx 07/2013;  b. CHA2DS2VASc=1 (female);  c. 07/2013 Echo:  EF 50-55%, no rwma, mod LVH.  . Pulmonary embolism ([facility]) ~ 1984   "after I had my last child"  . SVD (spontaneous vaginal delivery)    x 4    Past Surgical History:  Procedure Laterality Date  . APPENDECTOMY  1990's  . COLONOSCOPY    . DILATATION & CURETTAGE/HYSTEROSCOPY WITH MYOSURE N/A 04/14/2017   Procedure: DILATATION & CURETTAGE/HYSTEROSCOPY WITH MYOSURE;  Surgeon: Janyth Pupa, DO;  Location: [facility];  Service: Gynecology;  Laterality: N/A;  Polypectomy  . DILATION AND CURETTAGE OF UTERUS  1983   mab  . INNER EAR SURGERY Right 2000's  . LEFT HEART CATH AND CORONARY ANGIOGRAPHY N/A 04/17/2017   Procedure: Left Heart Cath and Coronary Angiography;  Surgeon: Jettie Booze, MD;  Location: [facility];  Service: Cardiovascular;  Laterality: N/A;  . PLANTAR FASCIA RELEASE Right 07/2015  . TUBAL LIGATION  1985    Current Outpatient Medications  Medication Sig Dispense Refill  . atorvastatin (LIPITOR) 20 MG tablet Take 1  tablet (20 mg total) by mouth daily at 6 PM. 30 tablet 9  . diltiazem (CARDIZEM) 30 MG tablet Take 1 tablet every 4 hours AS NEEDED for AFIB heart rate >100 45 tablet 1  . dofetilide (TIKOSYN) 500 MCG capsule TAKE 1 CAPSULE TWICE A DAY 180 capsule 2  . ELIQUIS 5 MG TABS tablet TAKE 1 TABLET(5 MG) BY MOUTH TWICE DAILY 180 tablet 2  . lurasidone (LATUDA) 20 MG TABS tablet Take 1 tablet by mouth daily.    . metoprolol tartrate (LOPRESSOR) 50 MG tablet Take 1 tablet (50 mg total) by mouth 2 (two) times daily. 180 tablet 3  .  pantoprazole (PROTONIX) 40 MG tablet Take 1 tablet (40 mg total) by mouth daily. 30 tablet 3  . potassium chloride (K-DUR) 10 MEQ tablet Take 40 mEq by mouth daily.    . pregabalin (LYRICA) 50 MG capsule Take 1 capsule PO BID x 7 days. Then increase to 1 capsule TID. 90 capsule 0  . rizatriptan (MAXALT) 10 MG tablet Take 10 mg by mouth as needed for migraine. May repeat in 2 hours if needed    . tiZANidine (ZANAFLEX) 4 MG tablet Take 4 mg by mouth every 6 (six) hours as needed.     . topiramate (TOPAMAX) 100 MG tablet Take 100 mg by mouth daily.    Marland Kitchen torsemide (DEMADEX) 20 MG tablet Take 1 tablet by mouth daily.    . traMADol (ULTRAM) 50 MG tablet Take 1 tablet by mouth as needed.     No current facility-administered medications for this visit.     Allergies:   Codeine   Social History:  The patient  reports that she quit smoking about 28 years ago. Her smoking use included cigarettes. She has a 8.00 pack-year smoking history. She has never used smokeless tobacco. She reports previous alcohol use. She reports that she does not use drugs.   Family History:  The patient's  family history includes Alcohol abuse in her father; Atrial fibrillation in her brother; Cardiomyopathy in her daughter; Congestive Heart Failure in her mother; Kidney failure in her mother; Lung cancer in her father; Sudden Cardiac Death in her daughter; Wolff Parkinson White syndrome in her grandchild.   ROS:  Please see the history of present illness.   All other systems are personally reviewed and negative.    Exam:    Vital Signs:  BP 110/63   Ht 5\' 7"  (1.702 m)   BMI 51.79 kg/m   Well appearing, alert and conversant, regular work of breathing,  good skin color Eyes- anicteric, neuro- grossly intact, skin- no apparent rash or lesions or cyanosis, mouth- oral mucosa is pink   Labs/Other Tests and Data Reviewed:    Recent Labs: 08/07/2018: TSH 1.000 09/18/2018: Magnesium 2.3 03/22/2019: B Natriuretic Peptide  56.0; BUN 13; Creatinine, Ser 0.70; Hemoglobin 11.9; Platelets 308; Potassium 3.7; Sodium 141   Wt Readings from Last 3 Encounters:  04/05/19 (!) 330 lb 11 oz (150 kg)  04/02/19 (!) 332 lb (150.6 kg)  03/31/19 (!) 326 lb (147.9 kg)     Other studies personally reviewed: Additional studies/ records that were reviewed today include: ECG 03/29/19 personally reviewed  Review of the above records today demonstrates:   Sinus rhythm   ASSESSMENT & PLAN:    1.  Paroxysmal atrial fibrillation: Currently on dofetilide and Eliquis.  QTC is remained stable.  She does need to consider labs which we  order today.    This patients CHA2DS2-VASc Score  and unadjusted Ischemic Stroke Rate (% per year) is equal to 2.2 % stroke rate/year from a score of 2  Above score calculated as 1 point each if present [CHF, HTN, DM, Vascular=MI/PAD/Aortic Plaque, Age if 65-74, or Female] Above score calculated as 2 points each if present [Age > 75, or Stroke/TIA/TE]   2.  Obstructive sleep apnea: CPAP compliance encouraged  3.  Hypertension: well controlled  4.  Morbid obesity: Lifestyle modifications encouraged  COVID 19 screen The patient denies symptoms of COVID 19 at this time.  The importance of social distancing was discussed today.  Follow-up:  6 months  Current medicines are reviewed at length with the patient today.   The patient does not have concerns regarding her medicines.  The following changes were made today:  none  Labs/ tests ordered today include:  No orders of the defined types were placed in this encounter.    Patient Risk:  after full review of this patients clinical status, I feel that they are at moderate risk at this time.  Today, I have spent 8 minutes with the patient with telehealth technology discussing AF .    Signed,  Jorja LoaMartin , MD  07/06/2019 10:36 AM     [facility] [address] [place] [address] [phone] (office)  [phone] (fax)

## 2019-07-06 NOTE — Patient Instructions (Signed)
Medication Instructions:  Your physician recommends that you continue on your current medications as directed. Please refer to the Current Medication list given to you today.  * If you need a refill on your cardiac medications before your next appointment, please call your pharmacy.   Labwork: You will need Tikosyn surveillance labs.  I will contact you soon to arrange this to be drawn at rehab center. *We will only notify you of abnormal results, otherwise continue current treatment plan.  Testing/Procedures: None ordered  Follow-Up: Your physician wants you to follow-up in: 6 months with Dr. Curt Bears.  You will receive a reminder letter in the mail two months in advance. If you don't receive a letter, please call our office to schedule the follow-up appointment.  Thank you for choosing [facility]!!   Trinidad Curet, RN [phone]

## 2019-07-08 ENCOUNTER — Other Ambulatory Visit: Payer: Self-pay | Admitting: Emergency Medicine

## 2019-07-08 DIAGNOSIS — G8929 Other chronic pain: Secondary | ICD-10-CM | POA: Diagnosis not present

## 2019-07-08 DIAGNOSIS — M545 Low back pain: Secondary | ICD-10-CM | POA: Diagnosis not present

## 2019-07-08 DIAGNOSIS — K219 Gastro-esophageal reflux disease without esophagitis: Secondary | ICD-10-CM

## 2019-07-08 DIAGNOSIS — M5136 Other intervertebral disc degeneration, lumbar region: Secondary | ICD-10-CM | POA: Diagnosis not present

## 2019-07-08 MED ORDER — PANTOPRAZOLE SODIUM 40 MG PO TBEC: 40.0000 mg | DELAYED_RELEASE_TABLET | Freq: Every day | ORAL | 1 refills | Status: DC

## 2019-07-09 DIAGNOSIS — R609 Edema, unspecified: Secondary | ICD-10-CM | POA: Diagnosis not present

## 2019-07-09 DIAGNOSIS — Z7901 Long term (current) use of anticoagulants: Secondary | ICD-10-CM | POA: Diagnosis not present

## 2019-07-09 DIAGNOSIS — I4891 Unspecified atrial fibrillation: Secondary | ICD-10-CM | POA: Diagnosis not present

## 2019-07-09 DIAGNOSIS — I509 Heart failure, unspecified: Secondary | ICD-10-CM | POA: Diagnosis not present

## 2019-07-13 DIAGNOSIS — G4733 Obstructive sleep apnea (adult) (pediatric): Secondary | ICD-10-CM | POA: Diagnosis not present

## 2019-07-23 DIAGNOSIS — T887XXA Unspecified adverse effect of drug or medicament, initial encounter: Secondary | ICD-10-CM | POA: Diagnosis not present

## 2019-07-23 DIAGNOSIS — F319 Bipolar disorder, unspecified: Secondary | ICD-10-CM | POA: Diagnosis not present

## 2019-07-23 DIAGNOSIS — L299 Pruritus, unspecified: Secondary | ICD-10-CM | POA: Diagnosis not present

## 2019-07-23 DIAGNOSIS — I509 Heart failure, unspecified: Secondary | ICD-10-CM | POA: Diagnosis not present

## 2019-07-26 DIAGNOSIS — F319 Bipolar disorder, unspecified: Secondary | ICD-10-CM | POA: Diagnosis not present

## 2019-07-26 DIAGNOSIS — Z79899 Other long term (current) drug therapy: Secondary | ICD-10-CM | POA: Diagnosis not present

## 2019-07-26 DIAGNOSIS — L299 Pruritus, unspecified: Secondary | ICD-10-CM | POA: Diagnosis not present

## 2019-07-27 ENCOUNTER — Encounter: Payer: Self-pay | Admitting: Physician Assistant

## 2019-07-27 DIAGNOSIS — G4733 Obstructive sleep apnea (adult) (pediatric): Secondary | ICD-10-CM | POA: Insufficient documentation

## 2019-07-27 DIAGNOSIS — J441 Chronic obstructive pulmonary disease with (acute) exacerbation: Secondary | ICD-10-CM | POA: Diagnosis not present

## 2019-07-27 DIAGNOSIS — J984 Other disorders of lung: Secondary | ICD-10-CM | POA: Diagnosis not present

## 2019-07-27 DIAGNOSIS — R400 Somnolence: Secondary | ICD-10-CM | POA: Diagnosis not present

## 2019-07-27 DIAGNOSIS — J449 Chronic obstructive pulmonary disease, unspecified: Secondary | ICD-10-CM | POA: Diagnosis not present

## 2019-07-27 DIAGNOSIS — R05 Cough: Secondary | ICD-10-CM | POA: Diagnosis not present

## 2019-07-28 ENCOUNTER — Telehealth (HOSPITAL_COMMUNITY): Payer: Self-pay | Admitting: *Deleted

## 2019-07-28 NOTE — Telephone Encounter (Signed)
Patient called in stating she was started on z-pak for pneumonia - she started it yesterday - per Roderic Palau NP - she will need to stop this due to being on tikosyn (prolong QT) and have physician call in different antibiotic that does not effect the QT. Pt did take dose last night and this morning before calling. Pt to call PCP for change.

## 2019-07-29 DIAGNOSIS — G603 Idiopathic progressive neuropathy: Secondary | ICD-10-CM | POA: Diagnosis not present

## 2019-07-29 DIAGNOSIS — J189 Pneumonia, unspecified organism: Secondary | ICD-10-CM | POA: Diagnosis not present

## 2019-07-29 DIAGNOSIS — M5417 Radiculopathy, lumbosacral region: Secondary | ICD-10-CM | POA: Diagnosis not present

## 2019-07-29 DIAGNOSIS — I509 Heart failure, unspecified: Secondary | ICD-10-CM | POA: Diagnosis not present

## 2019-07-29 DIAGNOSIS — G3184 Mild cognitive impairment, so stated: Secondary | ICD-10-CM | POA: Diagnosis not present

## 2019-07-29 DIAGNOSIS — G43909 Migraine, unspecified, not intractable, without status migrainosus: Secondary | ICD-10-CM | POA: Diagnosis not present

## 2019-07-30 DIAGNOSIS — I872 Venous insufficiency (chronic) (peripheral): Secondary | ICD-10-CM | POA: Diagnosis not present

## 2019-07-30 DIAGNOSIS — G8929 Other chronic pain: Secondary | ICD-10-CM | POA: Diagnosis not present

## 2019-07-30 DIAGNOSIS — M5136 Other intervertebral disc degeneration, lumbar region: Secondary | ICD-10-CM | POA: Diagnosis not present

## 2019-08-06 ENCOUNTER — Telehealth: Payer: Self-pay

## 2019-08-06 ENCOUNTER — Other Ambulatory Visit: Payer: Self-pay

## 2019-08-06 DIAGNOSIS — G4733 Obstructive sleep apnea (adult) (pediatric): Secondary | ICD-10-CM | POA: Diagnosis not present

## 2019-08-06 NOTE — Telephone Encounter (Signed)
Order filled out and placed in PCP review folder for signature

## 2019-08-06 NOTE — Telephone Encounter (Signed)
Regina Morales called back stating that she needs a script/order for a bariatric shower chair to be sent to [facility] phone # is [phone] and fax# is [phone].  Regina Morales would like a phone call when this has been taken care of.  [facility] # is [phone]

## 2019-08-06 NOTE — Telephone Encounter (Signed)
Regina Morales with [employer] stated patient needs a Rx for a shower chair and a referral for home health for physical therapy. Cb# [phone]

## 2019-08-06 NOTE — Telephone Encounter (Signed)
Ok to send order over for DME shower chair. For the PT we will need visit - she has scheduled for earlier next week for this.

## 2019-08-06 NOTE — Telephone Encounter (Signed)
Completed and faxed in for patient.

## 2019-08-09 ENCOUNTER — Other Ambulatory Visit: Payer: Self-pay | Admitting: *Deleted

## 2019-08-09 ENCOUNTER — Other Ambulatory Visit: Payer: Self-pay | Admitting: Cardiology

## 2019-08-09 DIAGNOSIS — I480 Paroxysmal atrial fibrillation: Secondary | ICD-10-CM | POA: Diagnosis not present

## 2019-08-09 DIAGNOSIS — J449 Chronic obstructive pulmonary disease, unspecified: Secondary | ICD-10-CM | POA: Diagnosis not present

## 2019-08-09 DIAGNOSIS — M4807 Spinal stenosis, lumbosacral region: Secondary | ICD-10-CM | POA: Diagnosis not present

## 2019-08-09 DIAGNOSIS — G629 Polyneuropathy, unspecified: Secondary | ICD-10-CM | POA: Diagnosis not present

## 2019-08-09 DIAGNOSIS — I110 Hypertensive heart disease with heart failure: Secondary | ICD-10-CM | POA: Diagnosis not present

## 2019-08-09 DIAGNOSIS — I509 Heart failure, unspecified: Secondary | ICD-10-CM | POA: Diagnosis not present

## 2019-08-09 DIAGNOSIS — M1990 Unspecified osteoarthritis, unspecified site: Secondary | ICD-10-CM | POA: Diagnosis not present

## 2019-08-09 DIAGNOSIS — F332 Major depressive disorder, recurrent severe without psychotic features: Secondary | ICD-10-CM | POA: Diagnosis not present

## 2019-08-09 DIAGNOSIS — M797 Fibromyalgia: Secondary | ICD-10-CM | POA: Diagnosis not present

## 2019-08-09 DIAGNOSIS — M5136 Other intervertebral disc degeneration, lumbar region: Secondary | ICD-10-CM | POA: Diagnosis not present

## 2019-08-09 DIAGNOSIS — G8929 Other chronic pain: Secondary | ICD-10-CM | POA: Diagnosis not present

## 2019-08-09 DIAGNOSIS — E876 Hypokalemia: Secondary | ICD-10-CM | POA: Diagnosis not present

## 2019-08-09 DIAGNOSIS — I872 Venous insufficiency (chronic) (peripheral): Secondary | ICD-10-CM | POA: Diagnosis not present

## 2019-08-09 DIAGNOSIS — M545 Low back pain: Secondary | ICD-10-CM | POA: Diagnosis not present

## 2019-08-09 DIAGNOSIS — R262 Difficulty in walking, not elsewhere classified: Secondary | ICD-10-CM | POA: Diagnosis not present

## 2019-08-09 MED ORDER — DILTIAZEM HCL 30 MG PO TABS: ORAL_TABLET | ORAL | 3 refills | Status: DC

## 2019-08-09 MED ORDER — APIXABAN 5 MG PO TABS: ORAL_TABLET | ORAL | 2 refills | Status: DC

## 2019-08-09 MED ORDER — METOPROLOL TARTRATE 50 MG PO TABS: 50.0000 mg | ORAL_TABLET | Freq: Two times a day (BID) | ORAL | 3 refills | Status: DC

## 2019-08-09 NOTE — Telephone Encounter (Signed)
Eliquis 5mg  refill request received; pt is 58 yrs old, weight-150kg, Crea-0.70 on 03/22/2019, Diagnosis-Afib, and last seen by Dr. Curt Bears on 07/06/2019. Dose is appropriate based on dosing criteria. Will send in refill to requested pharmacy-mail order.

## 2019-08-10 ENCOUNTER — Ambulatory Visit (INDEPENDENT_AMBULATORY_CARE_PROVIDER_SITE_OTHER): Payer: BC Managed Care – PPO | Admitting: Physician Assistant

## 2019-08-10 ENCOUNTER — Other Ambulatory Visit: Payer: Self-pay

## 2019-08-10 ENCOUNTER — Encounter: Payer: Self-pay | Admitting: Physician Assistant

## 2019-08-10 VITALS — BP 118/76 | HR 72 | Temp 98.1°F | Resp 16 | Ht 67.0 in | Wt 329.0 lb

## 2019-08-10 DIAGNOSIS — G4733 Obstructive sleep apnea (adult) (pediatric): Secondary | ICD-10-CM

## 2019-08-10 DIAGNOSIS — I480 Paroxysmal atrial fibrillation: Secondary | ICD-10-CM | POA: Diagnosis not present

## 2019-08-10 DIAGNOSIS — I10 Essential (primary) hypertension: Secondary | ICD-10-CM

## 2019-08-10 DIAGNOSIS — J449 Chronic obstructive pulmonary disease, unspecified: Secondary | ICD-10-CM | POA: Diagnosis not present

## 2019-08-10 DIAGNOSIS — F325 Major depressive disorder, single episode, in full remission: Secondary | ICD-10-CM

## 2019-08-10 DIAGNOSIS — M5416 Radiculopathy, lumbar region: Secondary | ICD-10-CM

## 2019-08-10 LAB — CBC WITH DIFFERENTIAL/PLATELET
Basophils Absolute: 0.1 10*3/uL (ref 0.0–0.1)
Basophils Relative: 0.8 % (ref 0.0–3.0)
Eosinophils Absolute: 0.3 10*3/uL (ref 0.0–0.7)
Eosinophils Relative: 3.8 % (ref 0.0–5.0)
HCT: 37.9 % (ref 36.0–46.0)
Hemoglobin: 12.3 g/dL (ref 12.0–15.0)
Lymphocytes Relative: 23.2 % (ref 12.0–46.0)
Lymphs Abs: 1.7 10*3/uL (ref 0.7–4.0)
MCHC: 32.4 g/dL (ref 30.0–36.0)
MCV: 85.4 fl (ref 78.0–100.0)
Monocytes Absolute: 0.4 10*3/uL (ref 0.1–1.0)
Monocytes Relative: 5.9 % (ref 3.0–12.0)
Neutro Abs: 4.9 10*3/uL (ref 1.4–7.7)
Neutrophils Relative %: 66.3 % (ref 43.0–77.0)
Platelets: 285 10*3/uL (ref 150.0–400.0)
RBC: 4.44 Mil/uL (ref 3.87–5.11)
RDW: 14.2 % (ref 11.5–15.5)
WBC: 7.4 10*3/uL (ref 4.0–10.5)

## 2019-08-10 LAB — COMPREHENSIVE METABOLIC PANEL
ALT: 69 U/L — ABNORMAL HIGH (ref 0–35)
AST: 72 U/L — ABNORMAL HIGH (ref 0–37)
Albumin: 4.5 g/dL (ref 3.5–5.2)
Alkaline Phosphatase: 82 U/L (ref 39–117)
BUN: 16 mg/dL (ref 6–23)
CO2: 29 mEq/L (ref 19–32)
Calcium: 9.7 mg/dL (ref 8.4–10.5)
Chloride: 102 mEq/L (ref 96–112)
Creatinine, Ser: 0.74 mg/dL (ref 0.40–1.20)
GFR: 81.04 mL/min (ref >60.00)
Glucose, Bld: 108 mg/dL — ABNORMAL HIGH (ref 70–99)
Potassium: 3.2 mEq/L — ABNORMAL LOW (ref 3.5–5.1)
Sodium: 141 mEq/L (ref 135–145)
Total Bilirubin: 0.5 mg/dL (ref 0.2–1.2)
Total Protein: 7.2 g/dL (ref 6.0–8.3)

## 2019-08-10 LAB — TSH: TSH: 0.77 u[IU]/mL (ref 0.35–4.50)

## 2019-08-10 LAB — BRAIN NATRIURETIC PEPTIDE: Pro B Natriuretic peptide (BNP): 17 pg/mL (ref 0.0–100.0)

## 2019-08-10 MED ORDER — TRIAMCINOLONE ACETONIDE 0.1 % EX CREA: 1.0000 "application " | TOPICAL_CREAM | Freq: Two times a day (BID) | CUTANEOUS | 0 refills | Status: DC

## 2019-08-10 NOTE — Patient Instructions (Addendum)
Please go to the lab today for blood work.  I will call you with your results. We will alter treatment regimen(s) if indicated by your results.   I will work on the shower chair and bench for you.  Make sure to work hard on being compliant with your CPAP machine. This will help cardiovascular health, energy levels amongst other things.   I want you to call your Pulmonologist to let them know you are taking your COPD medications as directed but are still having to use your rescue inhaler several times per week.   Continue medications from Cardiology and Psychiatry taking as directed. Follow up with specialists as scheduled.   If labs are stable today I may be tweaking your fluid pill dosage.   Hang in there!

## 2019-08-10 NOTE — Progress Notes (Signed)
Patient presents to clinic today for Hospital Follow-up/Rehab Follow-up/TCM visit. Patient presented to ER on 04/29/2019 after noting worsening bilateral leg swelling and inability to walk. Workup at that time included [place] (negative for DVT), CXR (negative for acute cardiopulmonary process), unremarkable labs including CBC, CMP and Troponin. Was admitted for further management due to severity of pain and inability to walk.       During admission, patient underwent MRI of lumbar spine revealing multilevel DDD with mild spinal stenosis (L4-5) and moderate subarticular stenosis bilaterally (L5-S1). X-ray of foot also obtained and negative for acute findings but review of a recent MRI foot noted likely gout arthropathy. She was treated for this with 5-days of colchicine. IV Lasix given for edema but without much improvement so was placed on Lasix drip. Good diuresis in hospital which was switched to oral Torsemide before discharge. PT/OT was performed during admission. Patient was discharged to SNF/Rehab facility on 05/09/2019 for further physical therapy and 24-hour care before going home.       Patient stayed in SNF from 05/09/2019 until 07/31/2019 undergoing significant PT/OT to help her regain functionality. Endorses taking all medications as directed. Is able to ambulate some with her walker. Wheelchair for longer distance ambulation. Is still having home PT/OT [facility]) - notes having evaluation yesterday. Current plan is for PT once weekly and OT (schedule pending). Will also have a daily [place] aide. At night has noted an increase in HR -- 107. At rest. No CP, SOB, diaphoresis. Some associated increased anxiety with this. Has pending appointment with Cardiology. Has had OP sleep study, confirming diagnosis of OSA. Received CPAP yesterday and has not used yet.       Last follow-up with Psychiatry last Tuesday -- started patient on Abilify instead of Latuda. Other medications continued as directed for  now. Stopping Lexapro and switching to Wellbutrin.       L-side still giving her issue as far as pain -- L hip and knee and lumbar back. Seeing Neurology and Neurosurgery at present. Just saw Neurologist about 1.5 weeks ago. Lyrica increased from 100 QD to 200 mg QD-- then 300 mg QD (150 mg BID). Had a hard time standing up this morning due to pain. Tylenol and Tramadol for pain. Is helping some.    Past Medical History:  Diagnosis Date  . Anemia    hx years ago  . Buzzing in ear    "right; even after OR"  . Chest pain    a. 2009: neg MV  ([facility])  . Depression   . DVT (deep vein thrombosis) in pregnancy 1980's   LLE  . Fibromyalgia    "dx'd many many years ago; I haven't had any problems w/it" (04/15/2017)  . GERD (gastroesophageal reflux disease)   . Hepatic steatosis   . High cholesterol   . History of blood transfusion    "low count after appendix surgery" unsure # of units transfused  . Hypertension   . Migraines    "maybe once/2 months" (04/15/2017)  . Obesity   . OSA on CPAP   . Persistent atrial fibrillation ([place])    a. Dx 07/2013;  b. CHA2DS2VASc=1 (female);  c. 07/2013 Echo:  EF 50-55%, no rwma, mod LVH.  . Pulmonary embolism ([place]) ~ 1984   "after I had my last child"  . SVD (spontaneous vaginal delivery)    x 4    Current Outpatient Medications on File Prior to Visit  Medication  Sig Dispense Refill  . albuterol (VENTOLIN HFA) 108 (90 Base) MCG/ACT inhaler Inhale into the lungs.    Marland Kitchen apixaban (ELIQUIS) 5 MG TABS tablet TAKE 1 TABLET(5 MG) BY MOUTH TWICE DAILY 180 tablet 2  . ARIPiprazole (ABILIFY) 5 MG tablet TK 1 T PO QD UNTIL DIRECTED TO STOP    . diltiazem (CARDIZEM) 30 MG tablet Take 1 tablet every 4 hours AS NEEDED for AFIB heart rate >100 45 tablet 3  . dofetilide (TIKOSYN) 500 MCG capsule TAKE 1 CAPSULE TWICE A DAY 180 capsule 2  . escitalopram (LEXAPRO) 5 MG tablet TAKE 1 TABLET BY MOUTH EVERY DAY FOR DEPRESSION    . KLOR-CON M20 20 MEQ tablet TAKE 2 TABLETS BY  MOUTH EVERY DAY FOR HTN    . metoprolol tartrate (LOPRESSOR) 50 MG tablet Take 1 tablet (50 mg total) by mouth 2 (two) times daily. 180 tablet 3  . pantoprazole (PROTONIX) 40 MG tablet Take 1 tablet (40 mg total) by mouth daily. 90 tablet 1  . pregabalin (LYRICA) 100 MG capsule TAKE 1 CAPSULE BY MOUTH 3 TIMES A DAY FOR 30 DAYS    . promethazine (PHENERGAN) 25 MG tablet TAKE 1 TABLET BY MOUTH EVERY 4 HOURS AS NEEDED FOR NAUSEA AND VOMITING **MAX OF 8 TAB IN 48 HOURS**    . rizatriptan (MAXALT) 10 MG tablet Take 10 mg by mouth as needed for migraine. May repeat in 2 hours if needed    . Tiotropium Bromide-Olodaterol (STIOLTO RESPIMAT) 2.5-2.5 MCG/ACT AERS Inhale 1 Inhaler into the lungs daily.    Marland Kitchen tiZANidine (ZANAFLEX) 4 MG tablet Take 4 mg by mouth every 6 (six) hours as needed.     . topiramate (TOPAMAX) 100 MG tablet Take 100 mg by mouth daily.    Marland Kitchen torsemide (DEMADEX) 20 MG tablet Take 1 tablet by mouth daily.    . traMADol (ULTRAM) 50 MG tablet Take 1 tablet by mouth as needed.    . Vitamin D, Ergocalciferol, (DRISDOL) 1.25 MG (50000 UT) CAPS capsule TAKE 1 CAPSULE ONCE A WEEK    . atorvastatin (LIPITOR) 20 MG tablet Take 1 tablet (20 mg total) by mouth daily at 6 PM. (Patient not taking: Reported on 08/10/2019) 30 tablet 9   No current facility-administered medications on file prior to visit.     Allergies  Allergen Reactions  . Codeine Nausea And Vomiting    Family History  Problem Relation Age of Onset  . Lung cancer Father   . Alcohol abuse Father   . Kidney failure Mother        s/p renal Tx  . Congestive Heart Failure Mother   . Atrial fibrillation Brother   . Sudden Cardiac Death Daughter        cardiac arrest  . Cardiomyopathy Daughter   . [place] White syndrome Grandchild         granddaughter    Social History   Socioeconomic History  . Marital status: Divorced    Spouse name: Not on file  . Number of children: 1  . Years of education: Not on file  .  Highest education level: Not on file  Occupational History  . Not on file  Social Needs  . Financial resource strain: Not on file  . Food insecurity    Worry: Not on file    Inability: Not on file  . Transportation needs    Medical: Not on file    Non-medical: Not on file  Tobacco Use  .  Smoking status: Former Smoker    Packs/day: 1.00    Years: 8.00    Pack years: 8.00    Types: Cigarettes    Quit date: 11/04/1990    Years since quitting: 28.7  . Smokeless tobacco: Never Used  Substance and Sexual Activity  . Alcohol use: Not Currently  . Drug use: No  . Sexual activity: Not Currently    Birth control/protection: Post-menopausal  Lifestyle  . Physical activity    Days per week: Not on file    Minutes per session: Not on file  . Stress: Not on file  Relationships  . Social Herbalist on phone: Not on file    Gets together: Not on file    Attends religious service: Not on file    Active member of club or organization: Not on file    Attends meetings of clubs or organizations: Not on file    Relationship status: Not on file  Other Topics Concern  . Not on file  Social History Narrative   Lives in [place] with sister.  Does not routinely exercise.  Works in Financial trader for [employer].    Review of Systems - See HPI.  All other ROS are negative.  BP 118/76   Pulse 72   Temp 98.1 F (36.7 C) (Temporal)   Resp 16   Ht 5' 7" (1.702 m)   Wt (!) 329 lb (149.2 kg)   SpO2 98%   BMI 51.53 kg/m   Physical Exam  Assessment/Plan: 1. PAF (paroxysmal atrial fibrillation) ([place]) Managed by Cardiology. NSR today. Asymptomatic. Follow-up with Cardiology as scheduled. Wear CPAP nightly. Continue current regimen. DASH diet reviewed. Torsemide as directed. Elevate legs while resting. Compression stockings or ACE wraps recommended. Repeat labs today.  - Comp Met (CMET) - TSH - CBC w/Diff - B Nat Peptide  2. Essential hypertension BP normotensive today. Asymptomatic. Continue  current regimen. DASH diet reviewed.   3. OSA (obstructive sleep apnea) Has received CPAP as of yesterday. She has been encouraged to use nightly as directed.   4. Chronic obstructive pulmonary disease, unspecified COPD type ([place]) Continue maintenance inhalers. Needs follow-up due to continued use of rescue inhaler.   5. Lumbar radiculopathy Continue management per specialists.   6. Major depressive disorder in full remission, unspecified whether recurrent [facility]: [facility] Rhonda W) Continue management per specialist.    Leeanne Rio, PA-C

## 2019-08-11 ENCOUNTER — Encounter: Payer: Self-pay | Admitting: Physician Assistant

## 2019-08-11 DIAGNOSIS — G4733 Obstructive sleep apnea (adult) (pediatric): Secondary | ICD-10-CM | POA: Diagnosis not present

## 2019-08-12 ENCOUNTER — Telehealth: Payer: Self-pay

## 2019-08-12 NOTE — Telephone Encounter (Signed)
Ok to [employer].

## 2019-08-12 NOTE — Telephone Encounter (Signed)
Santiago Glad with [facility] [phone] is requesting:  Once weekly for 5 weeks  OT Once weekly for 6 weeks  PT

## 2019-08-13 ENCOUNTER — Other Ambulatory Visit: Payer: Self-pay | Admitting: Emergency Medicine

## 2019-08-13 DIAGNOSIS — M5136 Other intervertebral disc degeneration, lumbar region: Secondary | ICD-10-CM | POA: Diagnosis not present

## 2019-08-13 DIAGNOSIS — G8929 Other chronic pain: Secondary | ICD-10-CM | POA: Diagnosis not present

## 2019-08-13 DIAGNOSIS — M797 Fibromyalgia: Secondary | ICD-10-CM | POA: Diagnosis not present

## 2019-08-13 DIAGNOSIS — M545 Low back pain: Secondary | ICD-10-CM | POA: Diagnosis not present

## 2019-08-13 DIAGNOSIS — M1990 Unspecified osteoarthritis, unspecified site: Secondary | ICD-10-CM | POA: Diagnosis not present

## 2019-08-13 DIAGNOSIS — I509 Heart failure, unspecified: Secondary | ICD-10-CM | POA: Diagnosis not present

## 2019-08-13 DIAGNOSIS — E876 Hypokalemia: Secondary | ICD-10-CM | POA: Diagnosis not present

## 2019-08-13 DIAGNOSIS — M4807 Spinal stenosis, lumbosacral region: Secondary | ICD-10-CM | POA: Diagnosis not present

## 2019-08-13 DIAGNOSIS — R262 Difficulty in walking, not elsewhere classified: Secondary | ICD-10-CM | POA: Diagnosis not present

## 2019-08-13 DIAGNOSIS — R7989 Other specified abnormal findings of blood chemistry: Secondary | ICD-10-CM

## 2019-08-13 DIAGNOSIS — F332 Major depressive disorder, recurrent severe without psychotic features: Secondary | ICD-10-CM | POA: Diagnosis not present

## 2019-08-13 DIAGNOSIS — I480 Paroxysmal atrial fibrillation: Secondary | ICD-10-CM | POA: Diagnosis not present

## 2019-08-13 DIAGNOSIS — G629 Polyneuropathy, unspecified: Secondary | ICD-10-CM | POA: Diagnosis not present

## 2019-08-13 DIAGNOSIS — I872 Venous insufficiency (chronic) (peripheral): Secondary | ICD-10-CM | POA: Diagnosis not present

## 2019-08-13 DIAGNOSIS — I110 Hypertensive heart disease with heart failure: Secondary | ICD-10-CM | POA: Diagnosis not present

## 2019-08-13 DIAGNOSIS — J449 Chronic obstructive pulmonary disease, unspecified: Secondary | ICD-10-CM | POA: Diagnosis not present

## 2019-08-17 DIAGNOSIS — I872 Venous insufficiency (chronic) (peripheral): Secondary | ICD-10-CM | POA: Diagnosis not present

## 2019-08-17 DIAGNOSIS — M797 Fibromyalgia: Secondary | ICD-10-CM | POA: Diagnosis not present

## 2019-08-17 DIAGNOSIS — F332 Major depressive disorder, recurrent severe without psychotic features: Secondary | ICD-10-CM | POA: Diagnosis not present

## 2019-08-17 DIAGNOSIS — R262 Difficulty in walking, not elsewhere classified: Secondary | ICD-10-CM | POA: Diagnosis not present

## 2019-08-17 DIAGNOSIS — G8929 Other chronic pain: Secondary | ICD-10-CM | POA: Diagnosis not present

## 2019-08-17 DIAGNOSIS — M545 Low back pain: Secondary | ICD-10-CM | POA: Diagnosis not present

## 2019-08-17 DIAGNOSIS — I509 Heart failure, unspecified: Secondary | ICD-10-CM | POA: Diagnosis not present

## 2019-08-17 DIAGNOSIS — M5136 Other intervertebral disc degeneration, lumbar region: Secondary | ICD-10-CM | POA: Diagnosis not present

## 2019-08-17 DIAGNOSIS — I480 Paroxysmal atrial fibrillation: Secondary | ICD-10-CM | POA: Diagnosis not present

## 2019-08-17 DIAGNOSIS — M1990 Unspecified osteoarthritis, unspecified site: Secondary | ICD-10-CM | POA: Diagnosis not present

## 2019-08-17 DIAGNOSIS — M4807 Spinal stenosis, lumbosacral region: Secondary | ICD-10-CM | POA: Diagnosis not present

## 2019-08-17 DIAGNOSIS — I110 Hypertensive heart disease with heart failure: Secondary | ICD-10-CM | POA: Diagnosis not present

## 2019-08-17 DIAGNOSIS — E876 Hypokalemia: Secondary | ICD-10-CM | POA: Diagnosis not present

## 2019-08-17 DIAGNOSIS — G629 Polyneuropathy, unspecified: Secondary | ICD-10-CM | POA: Diagnosis not present

## 2019-08-17 DIAGNOSIS — J449 Chronic obstructive pulmonary disease, unspecified: Secondary | ICD-10-CM | POA: Diagnosis not present

## 2019-08-19 ENCOUNTER — Other Ambulatory Visit: Payer: Self-pay

## 2019-08-19 DIAGNOSIS — M797 Fibromyalgia: Secondary | ICD-10-CM | POA: Diagnosis not present

## 2019-08-19 DIAGNOSIS — I480 Paroxysmal atrial fibrillation: Secondary | ICD-10-CM | POA: Diagnosis not present

## 2019-08-19 DIAGNOSIS — M5136 Other intervertebral disc degeneration, lumbar region: Secondary | ICD-10-CM | POA: Diagnosis not present

## 2019-08-19 DIAGNOSIS — J449 Chronic obstructive pulmonary disease, unspecified: Secondary | ICD-10-CM | POA: Diagnosis not present

## 2019-08-19 DIAGNOSIS — G629 Polyneuropathy, unspecified: Secondary | ICD-10-CM | POA: Diagnosis not present

## 2019-08-19 DIAGNOSIS — I110 Hypertensive heart disease with heart failure: Secondary | ICD-10-CM | POA: Diagnosis not present

## 2019-08-19 DIAGNOSIS — M545 Low back pain: Secondary | ICD-10-CM | POA: Diagnosis not present

## 2019-08-19 DIAGNOSIS — G8929 Other chronic pain: Secondary | ICD-10-CM | POA: Diagnosis not present

## 2019-08-19 DIAGNOSIS — E876 Hypokalemia: Secondary | ICD-10-CM | POA: Diagnosis not present

## 2019-08-19 DIAGNOSIS — I509 Heart failure, unspecified: Secondary | ICD-10-CM | POA: Diagnosis not present

## 2019-08-19 DIAGNOSIS — M4807 Spinal stenosis, lumbosacral region: Secondary | ICD-10-CM | POA: Diagnosis not present

## 2019-08-19 DIAGNOSIS — F332 Major depressive disorder, recurrent severe without psychotic features: Secondary | ICD-10-CM | POA: Diagnosis not present

## 2019-08-19 DIAGNOSIS — M1990 Unspecified osteoarthritis, unspecified site: Secondary | ICD-10-CM | POA: Diagnosis not present

## 2019-08-19 DIAGNOSIS — I872 Venous insufficiency (chronic) (peripheral): Secondary | ICD-10-CM | POA: Diagnosis not present

## 2019-08-19 DIAGNOSIS — R262 Difficulty in walking, not elsewhere classified: Secondary | ICD-10-CM | POA: Diagnosis not present

## 2019-08-20 ENCOUNTER — Ambulatory Visit (INDEPENDENT_AMBULATORY_CARE_PROVIDER_SITE_OTHER): Payer: BC Managed Care – PPO | Admitting: *Deleted

## 2019-08-20 DIAGNOSIS — Z23 Encounter for immunization: Secondary | ICD-10-CM | POA: Diagnosis not present

## 2019-08-20 DIAGNOSIS — E876 Hypokalemia: Secondary | ICD-10-CM

## 2019-08-20 DIAGNOSIS — R7989 Other specified abnormal findings of blood chemistry: Secondary | ICD-10-CM | POA: Diagnosis not present

## 2019-08-20 LAB — COMPREHENSIVE METABOLIC PANEL
ALT: 53 U/L — ABNORMAL HIGH (ref 0–35)
AST: 48 U/L — ABNORMAL HIGH (ref 0–37)
Albumin: 4.1 g/dL (ref 3.5–5.2)
Alkaline Phosphatase: 82 U/L (ref 39–117)
BUN: 16 mg/dL (ref 6–23)
CO2: 28 mEq/L (ref 19–32)
Calcium: 9.4 mg/dL (ref 8.4–10.5)
Chloride: 103 mEq/L (ref 96–112)
Creatinine, Ser: 0.79 mg/dL (ref 0.40–1.20)
GFR: 75.14 mL/min (ref >60.00)
Glucose, Bld: 93 mg/dL (ref 70–99)
Potassium: 3.8 mEq/L (ref 3.5–5.1)
Sodium: 141 mEq/L (ref 135–145)
Total Bilirubin: 0.5 mg/dL (ref 0.2–1.2)
Total Protein: 6.5 g/dL (ref 6.0–8.3)

## 2019-08-23 ENCOUNTER — Other Ambulatory Visit: Payer: Self-pay | Admitting: Emergency Medicine

## 2019-08-23 DIAGNOSIS — I509 Heart failure, unspecified: Secondary | ICD-10-CM | POA: Diagnosis not present

## 2019-08-23 DIAGNOSIS — R262 Difficulty in walking, not elsewhere classified: Secondary | ICD-10-CM | POA: Diagnosis not present

## 2019-08-23 DIAGNOSIS — R7989 Other specified abnormal findings of blood chemistry: Secondary | ICD-10-CM

## 2019-08-23 DIAGNOSIS — E876 Hypokalemia: Secondary | ICD-10-CM | POA: Diagnosis not present

## 2019-08-23 DIAGNOSIS — I110 Hypertensive heart disease with heart failure: Secondary | ICD-10-CM | POA: Diagnosis not present

## 2019-08-23 DIAGNOSIS — F332 Major depressive disorder, recurrent severe without psychotic features: Secondary | ICD-10-CM | POA: Diagnosis not present

## 2019-08-23 DIAGNOSIS — G629 Polyneuropathy, unspecified: Secondary | ICD-10-CM | POA: Diagnosis not present

## 2019-08-23 DIAGNOSIS — I480 Paroxysmal atrial fibrillation: Secondary | ICD-10-CM | POA: Diagnosis not present

## 2019-08-23 DIAGNOSIS — M797 Fibromyalgia: Secondary | ICD-10-CM | POA: Diagnosis not present

## 2019-08-23 DIAGNOSIS — M545 Low back pain: Secondary | ICD-10-CM | POA: Diagnosis not present

## 2019-08-23 DIAGNOSIS — M4807 Spinal stenosis, lumbosacral region: Secondary | ICD-10-CM | POA: Diagnosis not present

## 2019-08-23 DIAGNOSIS — I872 Venous insufficiency (chronic) (peripheral): Secondary | ICD-10-CM | POA: Diagnosis not present

## 2019-08-23 DIAGNOSIS — G8929 Other chronic pain: Secondary | ICD-10-CM | POA: Diagnosis not present

## 2019-08-23 DIAGNOSIS — M5136 Other intervertebral disc degeneration, lumbar region: Secondary | ICD-10-CM | POA: Diagnosis not present

## 2019-08-23 DIAGNOSIS — J449 Chronic obstructive pulmonary disease, unspecified: Secondary | ICD-10-CM | POA: Diagnosis not present

## 2019-08-23 DIAGNOSIS — M1990 Unspecified osteoarthritis, unspecified site: Secondary | ICD-10-CM | POA: Diagnosis not present

## 2019-08-24 ENCOUNTER — Other Ambulatory Visit: Payer: Self-pay | Admitting: *Deleted

## 2019-08-24 NOTE — Telephone Encounter (Signed)
I can definitely take over the Torsemide and will be happy to send this to her [facility]. Is she in need of a short-term supply to local pharmacy.   In terms of the Tramadol, I typically do not do chronic pain medication. I am ok continuing this for her if things are stable. How is she taking currently?

## 2019-08-24 NOTE — Telephone Encounter (Signed)
A short-term of Torsemide to the [facility] on [address] would be appreciated, as well as the long-term to the mail order.    The tramadol is "as needed"  She said typically she takes one in the morning and sometimes takes one at bedtime (depending on how her day has been) -  She has enough of this to last until the mail order comes in.

## 2019-08-24 NOTE — Telephone Encounter (Signed)
Patient called and stated that she is now home from the rehab center. She states that she needs refills on the torsemide and tramadol that she was started on in rehab, and the pharmacy told her that she would have to call us to see if cody can take over these medications.  If so, patient would like them sent to the [facility] in her file.

## 2019-08-25 MED ORDER — TRAMADOL HCL 50 MG PO TABS: 50.0000 mg | ORAL_TABLET | Freq: Two times a day (BID) | ORAL | 0 refills | Status: DC | PRN

## 2019-08-25 NOTE — Telephone Encounter (Signed)
On review of all her medications -- the Torsemide would need to come from Cardiology since she is on Tikosyn and they will need to monitor things with these medications. I will send a 30-day in to her local pharmacy until she can discuss with Cardiology.  The Tramadol has been sent.

## 2019-08-25 NOTE — Telephone Encounter (Signed)
LM for patient to call so that I can discuss the notes below so that she can call to get refills/appointment from Cardiologist

## 2019-08-25 NOTE — Telephone Encounter (Signed)
Spoke with patient to give her the information.  Stated verbal understanding.

## 2019-08-27 ENCOUNTER — Encounter (HOSPITAL_COMMUNITY): Payer: Self-pay | Admitting: Nurse Practitioner

## 2019-08-27 ENCOUNTER — Other Ambulatory Visit: Payer: Self-pay

## 2019-08-27 ENCOUNTER — Ambulatory Visit (HOSPITAL_COMMUNITY)
Admission: RE | Admit: 2019-08-27 | Discharge: 2019-08-27 | Disposition: A | Discharge status: Home / Self Care | Payer: BC Managed Care – PPO | Source: Ambulatory Visit | Attending: Nurse Practitioner | Admitting: Nurse Practitioner

## 2019-08-27 VITALS — BP 130/70 | HR 68 | Ht 67.0 in | Wt 335.2 lb

## 2019-08-27 DIAGNOSIS — Z86718 Personal history of other venous thrombosis and embolism: Secondary | ICD-10-CM | POA: Insufficient documentation

## 2019-08-27 DIAGNOSIS — G4733 Obstructive sleep apnea (adult) (pediatric): Secondary | ICD-10-CM | POA: Diagnosis not present

## 2019-08-27 DIAGNOSIS — I480 Paroxysmal atrial fibrillation: Secondary | ICD-10-CM

## 2019-08-27 DIAGNOSIS — I10 Essential (primary) hypertension: Secondary | ICD-10-CM | POA: Insufficient documentation

## 2019-08-27 DIAGNOSIS — K219 Gastro-esophageal reflux disease without esophagitis: Secondary | ICD-10-CM | POA: Diagnosis not present

## 2019-08-27 DIAGNOSIS — Z79899 Other long term (current) drug therapy: Secondary | ICD-10-CM | POA: Insufficient documentation

## 2019-08-27 DIAGNOSIS — S92901A Unspecified fracture of right foot, initial encounter for closed fracture: Secondary | ICD-10-CM | POA: Diagnosis not present

## 2019-08-27 DIAGNOSIS — Z885 Allergy status to narcotic agent status: Secondary | ICD-10-CM | POA: Diagnosis not present

## 2019-08-27 DIAGNOSIS — E669 Obesity, unspecified: Secondary | ICD-10-CM | POA: Diagnosis not present

## 2019-08-27 DIAGNOSIS — Z87891 Personal history of nicotine dependence: Secondary | ICD-10-CM | POA: Diagnosis not present

## 2019-08-27 DIAGNOSIS — I4819 Other persistent atrial fibrillation: Secondary | ICD-10-CM | POA: Diagnosis not present

## 2019-08-27 DIAGNOSIS — Z6841 Body Mass Index (BMI) 40.0 and over, adult: Secondary | ICD-10-CM | POA: Insufficient documentation

## 2019-08-27 DIAGNOSIS — Z8249 Family history of ischemic heart disease and other diseases of the circulatory system: Secondary | ICD-10-CM | POA: Diagnosis not present

## 2019-08-27 DIAGNOSIS — Z86711 Personal history of pulmonary embolism: Secondary | ICD-10-CM | POA: Diagnosis not present

## 2019-08-27 DIAGNOSIS — S92902A Unspecified fracture of left foot, initial encounter for closed fracture: Secondary | ICD-10-CM | POA: Insufficient documentation

## 2019-08-27 DIAGNOSIS — Z801 Family history of malignant neoplasm of trachea, bronchus and lung: Secondary | ICD-10-CM | POA: Diagnosis not present

## 2019-08-27 DIAGNOSIS — R9431 Abnormal electrocardiogram [ECG] [EKG]: Secondary | ICD-10-CM | POA: Insufficient documentation

## 2019-08-27 LAB — BASIC METABOLIC PANEL
Anion gap: 12 (ref 5–15)
BUN: 14 mg/dL (ref 6–20)
CO2: 24 mmol/L (ref 22–32)
Calcium: 9 mg/dL (ref 8.9–10.3)
Chloride: 105 mmol/L (ref 98–111)
Creatinine, Ser: 0.79 mg/dL (ref 0.44–1.00)
GFR calc Af Amer: >60 mL/min (ref >60)
GFR calc non Af Amer: >60 mL/min (ref >60)
Glucose, Bld: 84 mg/dL (ref 70–99)
Potassium: 4.2 mmol/L (ref 3.5–5.1)
Sodium: 141 mmol/L (ref 135–145)

## 2019-08-27 LAB — MAGNESIUM: Magnesium: 2.2 mg/dL (ref 1.7–2.4)

## 2019-08-27 MED ORDER — TORSEMIDE 20 MG PO TABS: 40.0000 mg | ORAL_TABLET | Freq: Every day | ORAL | 6 refills | Status: DC

## 2019-08-27 MED ORDER — KLOR-CON M20 20 MEQ PO TBCR: 60.0000 meq | EXTENDED_RELEASE_TABLET | Freq: Every day | ORAL | 6 refills | Status: DC

## 2019-08-27 NOTE — Progress Notes (Signed)
Primary Care Physician: Waldon Merl, PA-C Referring Physician: Dr. Camnitz    Regina Morales is a 58 y.o. female with a h/o paroxysmal afib on tikosyn in for surveillance of drug. Since I last saw her, she had a fall and broke both feet. She is still not able to ambulate. She was in a nursing facility for sometime after the fall.During this time she has stayed in SR.  She has plans to wean off  Lexapro  to Wellbutrin in the next few days. qtc today is prolonged at 510 ms.  Today, she denies symptoms of palpitations, chest pain, shortness of breath, orthopnea, PND, lower extremity edema, dizziness, presyncope, syncope, or neurologic sequela. The patient is tolerating medications without difficulties and is otherwise without complaint today.   Past Medical History:  Diagnosis Date  . Anemia    hx years ago  . Buzzing in ear    "right; even after OR"  . Chest pain    a. 2009: neg MV  ([facility])  . Depression   . DVT (deep vein thrombosis) in pregnancy 1980's   LLE  . Fibromyalgia    "dx'd many many years ago; I haven't had any problems w/it" (04/15/2017)  . GERD (gastroesophageal reflux disease)   . Hepatic steatosis   . High cholesterol   . History of blood transfusion    "low count after appendix surgery" unsure # of units transfused  . Hypertension   . Migraines    "maybe once/2 months" (04/15/2017)  . Obesity   . OSA on CPAP   . Persistent atrial fibrillation ([facility])    a. Dx 07/2013;  b. CHA2DS2VASc=1 (female);  c. 07/2013 Echo:  EF 50-55%, no rwma, mod LVH.  . Pulmonary embolism ([facility]) ~ 1984   "after I had my last child"  . SVD (spontaneous vaginal delivery)    x 4   Past Surgical History:  Procedure Laterality Date  . APPENDECTOMY  1990's  . COLONOSCOPY    . DILATATION & CURETTAGE/HYSTEROSCOPY WITH MYOSURE N/A 04/14/2017   Procedure: DILATATION & CURETTAGE/HYSTEROSCOPY WITH MYOSURE;  Surgeon: Myna Hidalgo, DO;  Location: [facility];  Service: Gynecology;  Laterality:  N/A;  Polypectomy  . DILATION AND CURETTAGE OF UTERUS  1983   mab  . INNER EAR SURGERY Right 2000's  . LEFT HEART CATH AND CORONARY ANGIOGRAPHY N/A 04/17/2017   Procedure: Left Heart Cath and Coronary Angiography;  Surgeon: Corky Crafts, MD;  Location: [facility] INVASIVE CV LAB;  Service: Cardiovascular;  Laterality: N/A;  . PLANTAR FASCIA RELEASE Right 07/2015  . TUBAL LIGATION  1985    Current Outpatient Medications  Medication Sig Dispense Refill  . albuterol (VENTOLIN HFA) 108 (90 Base) MCG/ACT inhaler Inhale 2 puffs into the lungs as needed. 2 puffs every 6 hours prn    . apixaban (ELIQUIS) 5 MG TABS tablet TAKE 1 TABLET(5 MG) BY MOUTH TWICE DAILY 180 tablet 2  . ARIPiprazole (ABILIFY) 5 MG tablet TK 1 T PO QD UNTIL DIRECTED TO STOP    . diltiazem (CARDIZEM) 30 MG tablet Take 1 tablet every 4 hours AS NEEDED for AFIB heart rate >100 45 tablet 3  . dofetilide (TIKOSYN) 500 MCG capsule TAKE 1 CAPSULE TWICE A DAY 180 capsule 2  . escitalopram (LEXAPRO) 5 MG tablet TAKE 1 TABLET BY MOUTH EVERY DAY FOR DEPRESSION    . KLOR-CON M20 20 MEQ tablet Taking 60mg  daily    . metoprolol tartrate (LOPRESSOR) 50 MG tablet Take 1 tablet (50  mg total) by mouth 2 (two) times daily. 180 tablet 3  . pantoprazole (PROTONIX) 40 MG tablet Take 1 tablet (40 mg total) by mouth daily. 90 tablet 1  . pregabalin (LYRICA) 100 MG capsule TAKE 1 CAPSULE BY MOUTH 3 TIMES A DAY FOR 30 DAYS    . promethazine (PHENERGAN) 25 MG tablet as needed.     . rizatriptan (MAXALT) 10 MG tablet Take 10 mg by mouth as needed for migraine. May repeat in 2 hours if needed    . Tiotropium Bromide-Olodaterol (STIOLTO RESPIMAT) 2.5-2.5 MCG/ACT AERS Inhale 1 Inhaler into the lungs daily. Insert 2 puffs in the lungs daily    . tiZANidine (ZANAFLEX) 4 MG tablet Take 4 mg by mouth as needed.     . topiramate (TOPAMAX) 100 MG tablet Take 100 mg by mouth daily.    Marland Kitchen torsemide (DEMADEX) 20 MG tablet Take 1 tablet by mouth daily. Taking two  tablet in the am    . traMADol (ULTRAM) 50 MG tablet Take 1 tablet (50 mg total) by mouth every 12 (twelve) hours as needed. 60 tablet 0  . triamcinolone cream (KENALOG) 0.1 % Apply 1 application topically 2 (two) times daily. 30 g 0  . Vitamin D, Ergocalciferol, (DRISDOL) 1.25 MG (50000 UT) CAPS capsule TAKE 1 CAPSULE ONCE A WEEK     No current facility-administered medications for this encounter.     Allergies  Allergen Reactions  . Codeine Nausea And Vomiting    Social History   Socioeconomic History  . Marital status: Divorced    Spouse name: Not on file  . Number of children: 1  . Years of education: Not on file  . Highest education level: Not on file  Occupational History  . Not on file  Social Needs  . Financial resource strain: Not on file  . Food insecurity    Worry: Not on file    Inability: Not on file  . Transportation needs    Medical: Not on file    Non-medical: Not on file  Tobacco Use  . Smoking status: Former Smoker    Packs/day: 1.00    Years: 8.00    Pack years: 8.00    Types: Cigarettes    Quit date: 11/04/1990    Years since quitting: 28.8  . Smokeless tobacco: Never Used  Substance and Sexual Activity  . Alcohol use: Not Currently  . Drug use: No  . Sexual activity: Not Currently    Birth control/protection: Post-menopausal  Lifestyle  . Physical activity    Days per week: Not on file    Minutes per session: Not on file  . Stress: Not on file  Relationships  . Social Herbalist on phone: Not on file    Gets together: Not on file    Attends religious service: Not on file    Active member of club or organization: Not on file    Attends meetings of clubs or organizations: Not on file    Relationship status: Not on file  . Intimate partner violence    Fear of current or ex partner: Not on file    Emotionally abused: Not on file    Physically abused: Not on file    Forced sexual activity: Not on file  Other Topics Concern  . Not  on file  Social History Narrative   Lives in [place] with sister.  Does not routinely exercise.  Works in Financial trader for [employer].  Family History  Problem Relation Age of Onset  . Lung cancer Father   . Alcohol abuse Father   . Kidney failure Mother        s/p renal Tx  . Congestive Heart Failure Mother   . Atrial fibrillation Brother   . Sudden Cardiac Death Daughter        cardiac arrest  . Cardiomyopathy Daughter   . Evelene CroonWolff Parkinson White syndrome Grandchild         granddaughter    ROS- All systems are reviewed and negative except as per the HPI above  Physical Exam: Vitals:   08/27/19 1048  BP: 130/70  Pulse: 68  Weight: (!) 152 kg  Height: 5\' 7"  (1.702 m)   Wt Readings from Last 3 Encounters:  08/27/19 (!) 152 kg  08/10/19 (!) 149.2 kg  04/05/19 (!) 150 kg    Labs: Lab Results  Component Value Date   NA 141 08/27/2019   K 4.2 08/27/2019   CL 105 08/27/2019   CO2 24 08/27/2019   GLUCOSE 84 08/27/2019   BUN 14 08/27/2019   CREATININE 0.79 08/27/2019   CALCIUM 9.0 08/27/2019   MG 2.2 08/27/2019   Lab Results  Component Value Date   INR 1.0 03/22/2019   Lab Results  Component Value Date   CHOL 186 04/23/2014   HDL 53 04/23/2014   LDLCALC 109 (H) 04/23/2014   TRIG 122 04/23/2014     GEN- The patient is well appearing, alert and oriented x 3 today.   Head- normocephalic, atraumatic Eyes-  Sclera clear, conjunctiva pink Ears- hearing intact Oropharynx- clear Neck- supple, no JVP Lymph- no cervical lymphadenopathy Lungs- Clear to ausculation bilaterally, normal work of breathing Heart- Regular rate and rhythm, no murmurs, rubs or gallops, PMI not laterally displaced GI- soft, NT, ND, + BS Extremities- no clubbing, cyanosis, or edema MS- no significant deformity or atrophy Skin- no rash or lesion Psych- euthymic mood, full affect Neuro- strength and sensation are intact  EKG-SR with first degree AV block pr int 242 ms, qrs int 88 ms, qtc 510 ms  Last ekg in May reviewed 510 ms   Assessment and Plan: 1. Afib Has been staying in rhythm on tikosyn Qtc looks slightly long today  Pt is due to wean off lexapro to go to wellbutrin in the next week She will call her MD today to start process I will see back on Tuesday with repeat EKG Bmet/mag today stable K+/Mag  2. OSA Continue cpap  3. HTN Well controlled   4. Bilateral foot fx Still ambulation is limited, mostly chair/bed bound  Repeat  ekg on Tuesday  Anesia Blackwell C. Matthew Folksarroll, ANP-C [facility] [address] [place], [place] [address] [phone]

## 2019-08-29 DIAGNOSIS — G8929 Other chronic pain: Secondary | ICD-10-CM | POA: Diagnosis not present

## 2019-08-29 DIAGNOSIS — M4807 Spinal stenosis, lumbosacral region: Secondary | ICD-10-CM | POA: Diagnosis not present

## 2019-08-29 DIAGNOSIS — M1990 Unspecified osteoarthritis, unspecified site: Secondary | ICD-10-CM | POA: Diagnosis not present

## 2019-08-31 ENCOUNTER — Encounter (HOSPITAL_COMMUNITY): Payer: BC Managed Care – PPO | Admitting: Nurse Practitioner

## 2019-08-31 ENCOUNTER — Other Ambulatory Visit (HOSPITAL_COMMUNITY): Payer: Self-pay | Admitting: *Deleted

## 2019-08-31 MED ORDER — TORSEMIDE 20 MG PO TABS: 40.0000 mg | ORAL_TABLET | Freq: Every day | ORAL | 2 refills | Status: DC

## 2019-08-31 MED ORDER — KLOR-CON M20 20 MEQ PO TBCR: 60.0000 meq | EXTENDED_RELEASE_TABLET | Freq: Every day | ORAL | 2 refills | Status: DC

## 2019-09-01 ENCOUNTER — Encounter (HOSPITAL_COMMUNITY): Payer: Self-pay

## 2019-09-02 ENCOUNTER — Encounter (HOSPITAL_COMMUNITY): Payer: BC Managed Care – PPO | Admitting: Nurse Practitioner

## 2019-09-06 ENCOUNTER — Other Ambulatory Visit: Payer: Self-pay

## 2019-09-06 ENCOUNTER — Telehealth: Payer: Self-pay | Admitting: Physician Assistant

## 2019-09-06 DIAGNOSIS — G4733 Obstructive sleep apnea (adult) (pediatric): Secondary | ICD-10-CM | POA: Diagnosis not present

## 2019-09-06 DIAGNOSIS — Z20822 Contact with and (suspected) exposure to covid-19: Secondary | ICD-10-CM

## 2019-09-06 NOTE — Telephone Encounter (Signed)
Ebony Hail from [facility] called about this patient and is wanting to report a missed visit on 09/03/2019 and patient has been exposed to someone with COVID-19 and is waiting for text results.

## 2019-09-07 ENCOUNTER — Telehealth: Payer: Self-pay | Admitting: Cardiology

## 2019-09-07 MED ORDER — BUPROPION HCL ER (SR) 150 MG PO TB12: 150.0000 mg | ORAL_TABLET | Freq: Two times a day (BID) | ORAL | Status: DC

## 2019-09-07 NOTE — Telephone Encounter (Signed)
Thank you [name] for your help. I did send this to Roderic Palau as well.

## 2019-09-07 NOTE — Telephone Encounter (Signed)
Patient stopped lexapro on 10/30 and has switched to wellbutrin. Pharmacy notified of changes.

## 2019-09-07 NOTE — Telephone Encounter (Signed)
Pt c/o medication issue:  1. Name of Medication: dofetilide (TIKOSYN) 500 MCG capsule/ escitalopram (LEXAPRO) 5 MG tablet  2. How are you currently taking this medication (dosage and times per day)? As directed  3. Are you having a reaction (difficulty breathing--STAT)? no  4. What is your medication issue? [facility] that there is an issue that needs to be addressed between these two medications.

## 2019-09-08 ENCOUNTER — Telehealth: Payer: Self-pay

## 2019-09-08 LAB — NOVEL CORONAVIRUS, NAA: SARS-CoV-2, NAA: DETECTED — AB

## 2019-09-08 NOTE — Telephone Encounter (Signed)
Pt. Called back and she was given her COVID 19 results. Verbalizes understanding. States she has had a cough and low grade fever.Requests a virtual visit. Warm transfer to Santiago Glad in the practice.

## 2019-09-08 NOTE — Telephone Encounter (Signed)
Patient has been scheduled for VV 11.5.20

## 2019-09-09 ENCOUNTER — Other Ambulatory Visit: Payer: Self-pay

## 2019-09-09 ENCOUNTER — Encounter: Payer: Self-pay | Admitting: Physician Assistant

## 2019-09-09 ENCOUNTER — Ambulatory Visit (INDEPENDENT_AMBULATORY_CARE_PROVIDER_SITE_OTHER): Payer: BC Managed Care – PPO | Admitting: Physician Assistant

## 2019-09-09 VITALS — Temp 99.6°F

## 2019-09-09 DIAGNOSIS — U071 COVID-19: Secondary | ICD-10-CM

## 2019-09-09 DIAGNOSIS — H6690 Otitis media, unspecified, unspecified ear: Secondary | ICD-10-CM

## 2019-09-09 MED ORDER — BENZONATATE 100 MG PO CAPS: 100.0000 mg | ORAL_CAPSULE | Freq: Three times a day (TID) | ORAL | 0 refills | Status: DC | PRN

## 2019-09-09 MED ORDER — CEFDINIR 300 MG PO CAPS: 300.0000 mg | ORAL_CAPSULE | Freq: Two times a day (BID) | ORAL | 0 refills | Status: DC

## 2019-09-09 NOTE — Patient Instructions (Addendum)
Instructions sent to MyChart.   Please keep hydrated and try to rest. You can use plain Mucinex to thin any congestion. The Tessalon I have sent in should help with cough. Start a daily probiotic. Keep a bland diet until diarrhea has completely resolved. Hold your diuretic for the next 1-2 days until you are not having diarrhea.  Take the antibiotic as directed for ear pain. Continue all other chronic medications as directed.  ER for any worsening of symptoms or if you cannot keep in liquids.

## 2019-09-09 NOTE — Progress Notes (Signed)
I have discussed the procedure for the virtual visit with the patient who has given consent to proceed with assessment and treatment.   Regina Morales S Vibhav Waddill, CMA     

## 2019-09-09 NOTE — Progress Notes (Signed)
Virtual Visit via Video   I connected with patient on 09/09/19 at  8:00 AM EST by a video enabled telemedicine application and verified that I am speaking with the correct person using two identifiers.  Location patient: Home Location provider: [facility], Office Persons participating in the virtual visit: Patient, Provider, Lula (Patina Moore)  I discussed the limitations of evaluation and management by telemedicine and the availability of in person appointments. The patient expressed understanding and agreed to proceed.  Subjective:   HPI:   Patient presents via Doxy.Me today to discuss symptoms of COVID. Patient endorses diarrhea, chest congestion and cough starting Friday last week. Notes fatigue, watery stool, one episode last week of non-bloody emesis.  Stool frequency has slowed down over the last 48 hours, now going every few hours, small amounts. Denies melena, hematochezia. Denies nasal congestion or change to taste and smell at present. Denies fever, chills or aches at present. Notes L ear pain starting 48 hours ago and has worsened since onset. Oxygen averaging 93% at [facility]. . Was tested on Monday and tested + for COVID. Has not taken anything OTC for symptoms.   ROS:   See pertinent positives and negatives per HPI.  Patient Active Problem List   Diagnosis Date Noted  . OSA (obstructive sleep apnea) 07/27/2019  . Stage 3 severe COPD by GOLD classification ([place]) 06/24/2019  . Fibromyalgia 05/03/2019  . Venous insufficiency of both lower extremities 04/29/2019  . Peripheral neuropathy 04/14/2019  . Visit for monitoring Tikosyn therapy 09/15/2018  . 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 Tendinitis of right elbow 11/25/2017  . Abnormal nuclear stress test   . Essential hypertension   . Endometrial polyp   . Persistent atrial fibrillation ([place])   . Normocytic anemia 04/24/2014  . Depression 04/22/2014  . Migraine 04/22/2014  . PAF (paroxysmal atrial fibrillation)  ([place])   . GERD (gastroesophageal reflux disease)   . Obesity     Social History   Tobacco Use  . Smoking status: Former Smoker    Packs/day: 1.00    Years: 8.00    Pack years: 8.00    Types: Cigarettes    Quit date: 11/04/1990    Years since quitting: 28.8  . Smokeless tobacco: Never Used  Substance Use Topics  . Alcohol use: Not Currently    Current Outpatient Medications:  .  apixaban (ELIQUIS) 5 MG TABS tablet, TAKE 1 TABLET(5 MG) BY MOUTH TWICE DAILY, Disp: 180 tablet, Rfl: 2 .  ARIPiprazole (ABILIFY) 5 MG tablet, TK 1 T PO QD UNTIL DIRECTED TO STOP, Disp: , Rfl:  .  buPROPion (WELLBUTRIN SR) 150 MG 12 hr tablet, Take 1 tablet (150 mg total) by mouth 2 (two) times daily., Disp:  , Rfl:  .  diltiazem (CARDIZEM) 30 MG tablet, Take 1 tablet every 4 hours AS NEEDED for AFIB heart rate >100, Disp: 45 tablet, Rfl: 3 .  dofetilide (TIKOSYN) 500 MCG capsule, TAKE 1 CAPSULE TWICE A DAY, Disp: 180 capsule, Rfl: 2 .  KLOR-CON M20 20 MEQ tablet, Take 3 tablets (60 mEq total) by mouth daily., Disp: 270 tablet, Rfl: 2 .  metoprolol tartrate (LOPRESSOR) 50 MG tablet, Take 1 tablet (50 mg total) by mouth 2 (two) times daily., Disp: 180 tablet, Rfl: 3 .  pantoprazole (PROTONIX) 40 MG tablet, Take 1 tablet (40 mg total) by mouth daily., Disp: 90 tablet, Rfl: 1 .  pregabalin (LYRICA) 100 MG capsule, TAKE 1 CAPSULE BY MOUTH 3 TIMES A DAY FOR 30  DAYS, Disp: , Rfl:  .  promethazine (PHENERGAN) 25 MG tablet, as needed. , Disp: , Rfl:  .  rizatriptan (MAXALT) 10 MG tablet, Take 10 mg by mouth as needed for migraine. May repeat in 2 hours if needed, Disp: , Rfl:  .  Tiotropium Bromide-Olodaterol (STIOLTO RESPIMAT) 2.5-2.5 MCG/ACT AERS, Inhale 1 Inhaler into the lungs daily. Insert 2 puffs in the lungs daily, Disp: , Rfl:  .  tiZANidine (ZANAFLEX) 4 MG tablet, Take 4 mg by mouth as needed. , Disp: , Rfl:  .  topiramate (TOPAMAX) 100 MG tablet, Take 100 mg by mouth daily., Disp: , Rfl:  .  torsemide  (DEMADEX) 20 MG tablet, Take 2 tablets (40 mg total) by mouth daily., Disp: 180 tablet, Rfl: 2 .  traMADol (ULTRAM) 50 MG tablet, Take 1 tablet (50 mg total) by mouth every 12 (twelve) hours as needed., Disp: 60 tablet, Rfl: 0 .  triamcinolone cream (KENALOG) 0.1 %, Apply 1 application topically 2 (two) times daily., Disp: 30 g, Rfl: 0 .  Vitamin D, Ergocalciferol, (DRISDOL) 1.25 MG (50000 UT) CAPS capsule, TAKE 1 CAPSULE ONCE A WEEK, Disp: , Rfl:  .  albuterol (VENTOLIN HFA) 108 (90 Base) MCG/ACT inhaler, Inhale 2 puffs into the lungs as needed. 2 puffs every 6 hours prn, Disp: , Rfl:  .  benzonatate (TESSALON) 100 MG capsule, Take 1 capsule (100 mg total) by mouth 3 (three) times daily as needed for cough., Disp: 30 capsule, Rfl: 0 .  cefdinir (OMNICEF) 300 MG capsule, Take 1 capsule (300 mg total) by mouth 2 (two) times daily., Disp: 14 capsule, Rfl: 0  Allergies  Allergen Reactions  . Codeine Nausea And Vomiting    Objective:   Temp 99.6 F (37.6 C) (Oral)   Patient is well-developed, well-nourished in no acute distress.  Resting comfortably at home.  Head is normocephalic, atraumatic.  No labored breathing.  Speech is clear and coherent with logical content.  Patient is alert and oriented at baseline.   Assessment and Plan:   1. Acute otitis media, unspecified otitis media type Start Cefdinir. Want to avoid penicillins due to COVID-related diarrhea. She is also on Tikosyn which limits options. Supportive measures reviewed. - cefdinir (OMNICEF) 300 MG capsule; Take 1 capsule (300 mg total) by mouth 2 (two) times daily.  Dispense: 14 capsule; Refill: 0  2. COVID-19 virus infection Non-productive but consistent cough. Good oxygenation. No labored breathing. Supportive measures and OTC medications reviewed. Hold her diuretic for now due to diarrhea and risk of dehydration and electrolyte imbalance. Resume as diarrhea continues to resolve. Start probiotic. Rx Tessalon for cough.  Mucinex OTC as directed. Patient enrolled in COVID symptom monitoring program. Very low threshold for sending her to ER -- she is to go if anything worsens.  - benzonatate (TESSALON) 100 MG capsule; Take 1 capsule (100 mg total) by mouth 3 (three) times daily as needed for cough.  Dispense: 30 capsule; Refill: 0 - MyChart COVID-19 home monitoring program; Future - Temperature monitoring; Future    Piedad Climes, PA-C 09/09/2019

## 2019-09-10 ENCOUNTER — Encounter (INDEPENDENT_AMBULATORY_CARE_PROVIDER_SITE_OTHER): Payer: Self-pay

## 2019-09-11 ENCOUNTER — Encounter (INDEPENDENT_AMBULATORY_CARE_PROVIDER_SITE_OTHER): Payer: Self-pay

## 2019-09-12 ENCOUNTER — Telehealth: Payer: Self-pay

## 2019-09-12 ENCOUNTER — Encounter (INDEPENDENT_AMBULATORY_CARE_PROVIDER_SITE_OTHER): Payer: Self-pay

## 2019-09-12 NOTE — Telephone Encounter (Signed)
Pt c/o worsening cough, SOB only with coughing episodes. Cough sounds congested but pt is unable to produce phlegm. Advised pt to continue to monitor sx- if having respiratory that worsen or having wheezing to call 911 for evaluation. Pt verbalized understanding.

## 2019-09-13 ENCOUNTER — Encounter (INDEPENDENT_AMBULATORY_CARE_PROVIDER_SITE_OTHER): Payer: Self-pay

## 2019-09-14 ENCOUNTER — Encounter (INDEPENDENT_AMBULATORY_CARE_PROVIDER_SITE_OTHER): Payer: Self-pay

## 2019-09-15 ENCOUNTER — Encounter (INDEPENDENT_AMBULATORY_CARE_PROVIDER_SITE_OTHER): Payer: Self-pay

## 2019-09-16 ENCOUNTER — Encounter (INDEPENDENT_AMBULATORY_CARE_PROVIDER_SITE_OTHER): Payer: Self-pay

## 2019-09-17 ENCOUNTER — Encounter (INDEPENDENT_AMBULATORY_CARE_PROVIDER_SITE_OTHER): Payer: Self-pay

## 2019-09-17 ENCOUNTER — Telehealth: Payer: Self-pay

## 2019-09-17 DIAGNOSIS — G629 Polyneuropathy, unspecified: Secondary | ICD-10-CM

## 2019-09-17 DIAGNOSIS — M1990 Unspecified osteoarthritis, unspecified site: Secondary | ICD-10-CM

## 2019-09-17 DIAGNOSIS — G43909 Migraine, unspecified, not intractable, without status migrainosus: Secondary | ICD-10-CM

## 2019-09-17 DIAGNOSIS — E876 Hypokalemia: Secondary | ICD-10-CM

## 2019-09-17 DIAGNOSIS — M7741 Metatarsalgia, right foot: Secondary | ICD-10-CM

## 2019-09-17 DIAGNOSIS — R262 Difficulty in walking, not elsewhere classified: Secondary | ICD-10-CM | POA: Diagnosis not present

## 2019-09-17 DIAGNOSIS — G8929 Other chronic pain: Secondary | ICD-10-CM | POA: Diagnosis not present

## 2019-09-17 DIAGNOSIS — Z79891 Long term (current) use of opiate analgesic: Secondary | ICD-10-CM

## 2019-09-17 DIAGNOSIS — I480 Paroxysmal atrial fibrillation: Secondary | ICD-10-CM

## 2019-09-17 DIAGNOSIS — F332 Major depressive disorder, recurrent severe without psychotic features: Secondary | ICD-10-CM

## 2019-09-17 DIAGNOSIS — M5136 Other intervertebral disc degeneration, lumbar region: Secondary | ICD-10-CM

## 2019-09-17 DIAGNOSIS — I872 Venous insufficiency (chronic) (peripheral): Secondary | ICD-10-CM | POA: Diagnosis not present

## 2019-09-17 DIAGNOSIS — Z9181 History of falling: Secondary | ICD-10-CM

## 2019-09-17 DIAGNOSIS — M4807 Spinal stenosis, lumbosacral region: Secondary | ICD-10-CM

## 2019-09-17 DIAGNOSIS — M545 Low back pain: Secondary | ICD-10-CM | POA: Diagnosis not present

## 2019-09-17 DIAGNOSIS — I110 Hypertensive heart disease with heart failure: Secondary | ICD-10-CM

## 2019-09-17 DIAGNOSIS — I509 Heart failure, unspecified: Secondary | ICD-10-CM

## 2019-09-17 DIAGNOSIS — J449 Chronic obstructive pulmonary disease, unspecified: Secondary | ICD-10-CM

## 2019-09-17 DIAGNOSIS — M797 Fibromyalgia: Secondary | ICD-10-CM

## 2019-09-17 DIAGNOSIS — Z7901 Long term (current) use of anticoagulants: Secondary | ICD-10-CM

## 2019-09-17 DIAGNOSIS — E559 Vitamin D deficiency, unspecified: Secondary | ICD-10-CM

## 2019-09-17 NOTE — Telephone Encounter (Signed)
Fax # [phone]  Atten: Hinton Dyer with [employer] is requesting the last OV notes to be faxed to her.

## 2019-09-19 ENCOUNTER — Encounter (INDEPENDENT_AMBULATORY_CARE_PROVIDER_SITE_OTHER): Payer: Self-pay

## 2019-09-22 ENCOUNTER — Encounter (HOSPITAL_COMMUNITY): Payer: BC Managed Care – PPO | Admitting: Nurse Practitioner

## 2019-09-22 ENCOUNTER — Encounter (INDEPENDENT_AMBULATORY_CARE_PROVIDER_SITE_OTHER): Payer: Self-pay

## 2019-09-22 NOTE — Telephone Encounter (Signed)
Last 2 ov faxed to [place] at [facility]

## 2019-09-23 ENCOUNTER — Other Ambulatory Visit: Payer: Self-pay

## 2019-09-23 ENCOUNTER — Ambulatory Visit (HOSPITAL_COMMUNITY)
Admission: RE | Admit: 2019-09-23 | Discharge: 2019-09-23 | Disposition: A | Discharge status: Home / Self Care | Payer: BC Managed Care – PPO | Source: Ambulatory Visit | Attending: Nurse Practitioner | Admitting: Nurse Practitioner

## 2019-09-23 DIAGNOSIS — I4891 Unspecified atrial fibrillation: Secondary | ICD-10-CM | POA: Insufficient documentation

## 2019-09-23 DIAGNOSIS — I440 Atrioventricular block, first degree: Secondary | ICD-10-CM | POA: Insufficient documentation

## 2019-09-23 DIAGNOSIS — R9431 Abnormal electrocardiogram [ECG] [EKG]: Secondary | ICD-10-CM | POA: Insufficient documentation

## 2019-09-23 DIAGNOSIS — Z79899 Other long term (current) drug therapy: Secondary | ICD-10-CM | POA: Insufficient documentation

## 2019-09-23 LAB — BASIC METABOLIC PANEL
Anion gap: 14 (ref 5–15)
BUN: 14 mg/dL (ref 6–20)
CO2: 21 mmol/L — ABNORMAL LOW (ref 22–32)
Calcium: 9 mg/dL (ref 8.9–10.3)
Chloride: 107 mmol/L (ref 98–111)
Creatinine, Ser: 0.89 mg/dL (ref 0.44–1.00)
GFR calc Af Amer: >60 mL/min (ref >60)
GFR calc non Af Amer: >60 mL/min (ref >60)
Glucose, Bld: 100 mg/dL — ABNORMAL HIGH (ref 70–99)
Potassium: 3.6 mmol/L (ref 3.5–5.1)
Sodium: 142 mmol/L (ref 135–145)

## 2019-09-23 LAB — MAGNESIUM: Magnesium: 2.1 mg/dL (ref 1.7–2.4)

## 2019-09-23 NOTE — Progress Notes (Signed)
Pt is finally back in for repeat EKG with qtc long after last  office visit, on tikosyn.  Pt stopped her antidepressant and started on Wellbutrin. Office visit was delayed as she had covid. Qtc now in range  at  475 ms and in rhythm at 72 bpm. Pt reports that she had significant diarrhea, which has now resolved. Will repeat bmet and mag today. Has f/u with Dr. Shonna Chock in February  2021.

## 2019-09-27 DIAGNOSIS — G4733 Obstructive sleep apnea (adult) (pediatric): Secondary | ICD-10-CM | POA: Diagnosis not present

## 2019-09-27 DIAGNOSIS — J449 Chronic obstructive pulmonary disease, unspecified: Secondary | ICD-10-CM | POA: Diagnosis not present

## 2019-09-29 ENCOUNTER — Ambulatory Visit (HOSPITAL_COMMUNITY)
Admission: RE | Admit: 2019-09-29 | Discharge: 2019-09-29 | Disposition: A | Discharge status: Home / Self Care | Payer: BC Managed Care – PPO | Source: Ambulatory Visit | Attending: Nurse Practitioner | Admitting: Nurse Practitioner

## 2019-09-29 ENCOUNTER — Other Ambulatory Visit: Payer: Self-pay

## 2019-09-29 DIAGNOSIS — R202 Paresthesia of skin: Secondary | ICD-10-CM | POA: Diagnosis not present

## 2019-09-29 DIAGNOSIS — I4819 Other persistent atrial fibrillation: Secondary | ICD-10-CM | POA: Diagnosis not present

## 2019-09-29 DIAGNOSIS — G8929 Other chronic pain: Secondary | ICD-10-CM | POA: Diagnosis not present

## 2019-09-29 DIAGNOSIS — M4807 Spinal stenosis, lumbosacral region: Secondary | ICD-10-CM | POA: Diagnosis not present

## 2019-09-29 DIAGNOSIS — G3184 Mild cognitive impairment, so stated: Secondary | ICD-10-CM | POA: Diagnosis not present

## 2019-09-29 DIAGNOSIS — M545 Low back pain: Secondary | ICD-10-CM | POA: Diagnosis not present

## 2019-09-29 DIAGNOSIS — G43909 Migraine, unspecified, not intractable, without status migrainosus: Secondary | ICD-10-CM | POA: Diagnosis not present

## 2019-09-29 DIAGNOSIS — M1990 Unspecified osteoarthritis, unspecified site: Secondary | ICD-10-CM | POA: Diagnosis not present

## 2019-09-29 DIAGNOSIS — R600 Localized edema: Secondary | ICD-10-CM | POA: Diagnosis not present

## 2019-09-29 LAB — BASIC METABOLIC PANEL
Anion gap: 9 (ref 5–15)
BUN: 15 mg/dL (ref 6–20)
CO2: 26 mmol/L (ref 22–32)
Calcium: 9.4 mg/dL (ref 8.9–10.3)
Chloride: 108 mmol/L (ref 98–111)
Creatinine, Ser: 0.88 mg/dL (ref 0.44–1.00)
GFR calc Af Amer: >60 mL/min (ref >60)
GFR calc non Af Amer: >60 mL/min (ref >60)
Glucose, Bld: 128 mg/dL — ABNORMAL HIGH (ref 70–99)
Potassium: 3.8 mmol/L (ref 3.5–5.1)
Sodium: 143 mmol/L (ref 135–145)

## 2019-10-06 DIAGNOSIS — G4733 Obstructive sleep apnea (adult) (pediatric): Secondary | ICD-10-CM | POA: Diagnosis not present

## 2019-10-07 ENCOUNTER — Telehealth: Payer: Self-pay

## 2019-10-07 DIAGNOSIS — I110 Hypertensive heart disease with heart failure: Secondary | ICD-10-CM | POA: Diagnosis not present

## 2019-10-07 DIAGNOSIS — G8929 Other chronic pain: Secondary | ICD-10-CM | POA: Diagnosis not present

## 2019-10-07 DIAGNOSIS — I509 Heart failure, unspecified: Secondary | ICD-10-CM | POA: Diagnosis not present

## 2019-10-07 DIAGNOSIS — M545 Low back pain: Secondary | ICD-10-CM | POA: Diagnosis not present

## 2019-10-07 DIAGNOSIS — F332 Major depressive disorder, recurrent severe without psychotic features: Secondary | ICD-10-CM | POA: Diagnosis not present

## 2019-10-07 DIAGNOSIS — E876 Hypokalemia: Secondary | ICD-10-CM | POA: Diagnosis not present

## 2019-10-07 DIAGNOSIS — I872 Venous insufficiency (chronic) (peripheral): Secondary | ICD-10-CM | POA: Diagnosis not present

## 2019-10-07 DIAGNOSIS — M5136 Other intervertebral disc degeneration, lumbar region: Secondary | ICD-10-CM | POA: Diagnosis not present

## 2019-10-07 DIAGNOSIS — J449 Chronic obstructive pulmonary disease, unspecified: Secondary | ICD-10-CM | POA: Diagnosis not present

## 2019-10-07 DIAGNOSIS — R262 Difficulty in walking, not elsewhere classified: Secondary | ICD-10-CM | POA: Diagnosis not present

## 2019-10-07 DIAGNOSIS — M797 Fibromyalgia: Secondary | ICD-10-CM | POA: Diagnosis not present

## 2019-10-07 DIAGNOSIS — M4807 Spinal stenosis, lumbosacral region: Secondary | ICD-10-CM | POA: Diagnosis not present

## 2019-10-07 DIAGNOSIS — I480 Paroxysmal atrial fibrillation: Secondary | ICD-10-CM | POA: Diagnosis not present

## 2019-10-07 DIAGNOSIS — M1990 Unspecified osteoarthritis, unspecified site: Secondary | ICD-10-CM | POA: Diagnosis not present

## 2019-10-07 DIAGNOSIS — G629 Polyneuropathy, unspecified: Secondary | ICD-10-CM | POA: Diagnosis not present

## 2019-10-07 NOTE — Telephone Encounter (Signed)
Ok to return

## 2019-10-07 NOTE — Telephone Encounter (Signed)
Mark with [employer] [phone] calling to get verbal OK for care to return back into the home. Patient has tested positive for COVID, but has been symptom free for 2 weeks. If they do not return back into the home today, her care plan will end end of day today.

## 2019-10-07 NOTE — Telephone Encounter (Signed)
OK has been given

## 2019-10-11 DIAGNOSIS — J449 Chronic obstructive pulmonary disease, unspecified: Secondary | ICD-10-CM | POA: Diagnosis not present

## 2019-10-11 DIAGNOSIS — M5136 Other intervertebral disc degeneration, lumbar region: Secondary | ICD-10-CM | POA: Diagnosis not present

## 2019-10-11 DIAGNOSIS — I872 Venous insufficiency (chronic) (peripheral): Secondary | ICD-10-CM | POA: Diagnosis not present

## 2019-10-11 DIAGNOSIS — I509 Heart failure, unspecified: Secondary | ICD-10-CM | POA: Diagnosis not present

## 2019-10-11 DIAGNOSIS — G629 Polyneuropathy, unspecified: Secondary | ICD-10-CM | POA: Diagnosis not present

## 2019-10-11 DIAGNOSIS — I110 Hypertensive heart disease with heart failure: Secondary | ICD-10-CM | POA: Diagnosis not present

## 2019-10-11 DIAGNOSIS — M797 Fibromyalgia: Secondary | ICD-10-CM | POA: Diagnosis not present

## 2019-10-11 DIAGNOSIS — E876 Hypokalemia: Secondary | ICD-10-CM | POA: Diagnosis not present

## 2019-10-11 DIAGNOSIS — R262 Difficulty in walking, not elsewhere classified: Secondary | ICD-10-CM | POA: Diagnosis not present

## 2019-10-11 DIAGNOSIS — F332 Major depressive disorder, recurrent severe without psychotic features: Secondary | ICD-10-CM | POA: Diagnosis not present

## 2019-10-11 DIAGNOSIS — G8929 Other chronic pain: Secondary | ICD-10-CM | POA: Diagnosis not present

## 2019-10-11 DIAGNOSIS — M1990 Unspecified osteoarthritis, unspecified site: Secondary | ICD-10-CM | POA: Diagnosis not present

## 2019-10-11 DIAGNOSIS — I480 Paroxysmal atrial fibrillation: Secondary | ICD-10-CM | POA: Diagnosis not present

## 2019-10-11 DIAGNOSIS — M545 Low back pain: Secondary | ICD-10-CM | POA: Diagnosis not present

## 2019-10-11 DIAGNOSIS — M4807 Spinal stenosis, lumbosacral region: Secondary | ICD-10-CM | POA: Diagnosis not present

## 2019-10-13 DIAGNOSIS — E876 Hypokalemia: Secondary | ICD-10-CM | POA: Diagnosis not present

## 2019-10-13 DIAGNOSIS — R262 Difficulty in walking, not elsewhere classified: Secondary | ICD-10-CM | POA: Diagnosis not present

## 2019-10-13 DIAGNOSIS — M5136 Other intervertebral disc degeneration, lumbar region: Secondary | ICD-10-CM | POA: Diagnosis not present

## 2019-10-13 DIAGNOSIS — J449 Chronic obstructive pulmonary disease, unspecified: Secondary | ICD-10-CM | POA: Diagnosis not present

## 2019-10-13 DIAGNOSIS — I110 Hypertensive heart disease with heart failure: Secondary | ICD-10-CM | POA: Diagnosis not present

## 2019-10-13 DIAGNOSIS — G629 Polyneuropathy, unspecified: Secondary | ICD-10-CM | POA: Diagnosis not present

## 2019-10-13 DIAGNOSIS — M1990 Unspecified osteoarthritis, unspecified site: Secondary | ICD-10-CM | POA: Diagnosis not present

## 2019-10-13 DIAGNOSIS — I509 Heart failure, unspecified: Secondary | ICD-10-CM | POA: Diagnosis not present

## 2019-10-13 DIAGNOSIS — I480 Paroxysmal atrial fibrillation: Secondary | ICD-10-CM | POA: Diagnosis not present

## 2019-10-13 DIAGNOSIS — G8929 Other chronic pain: Secondary | ICD-10-CM | POA: Diagnosis not present

## 2019-10-13 DIAGNOSIS — M545 Low back pain: Secondary | ICD-10-CM | POA: Diagnosis not present

## 2019-10-13 DIAGNOSIS — F332 Major depressive disorder, recurrent severe without psychotic features: Secondary | ICD-10-CM | POA: Diagnosis not present

## 2019-10-13 DIAGNOSIS — M797 Fibromyalgia: Secondary | ICD-10-CM | POA: Diagnosis not present

## 2019-10-13 DIAGNOSIS — I872 Venous insufficiency (chronic) (peripheral): Secondary | ICD-10-CM | POA: Diagnosis not present

## 2019-10-13 DIAGNOSIS — M4807 Spinal stenosis, lumbosacral region: Secondary | ICD-10-CM | POA: Diagnosis not present

## 2019-10-18 DIAGNOSIS — M797 Fibromyalgia: Secondary | ICD-10-CM | POA: Diagnosis not present

## 2019-10-18 DIAGNOSIS — F332 Major depressive disorder, recurrent severe without psychotic features: Secondary | ICD-10-CM | POA: Diagnosis not present

## 2019-10-18 DIAGNOSIS — M5136 Other intervertebral disc degeneration, lumbar region: Secondary | ICD-10-CM | POA: Diagnosis not present

## 2019-10-18 DIAGNOSIS — R262 Difficulty in walking, not elsewhere classified: Secondary | ICD-10-CM | POA: Diagnosis not present

## 2019-10-18 DIAGNOSIS — M1990 Unspecified osteoarthritis, unspecified site: Secondary | ICD-10-CM | POA: Diagnosis not present

## 2019-10-18 DIAGNOSIS — I110 Hypertensive heart disease with heart failure: Secondary | ICD-10-CM | POA: Diagnosis not present

## 2019-10-18 DIAGNOSIS — M545 Low back pain: Secondary | ICD-10-CM | POA: Diagnosis not present

## 2019-10-18 DIAGNOSIS — I872 Venous insufficiency (chronic) (peripheral): Secondary | ICD-10-CM | POA: Diagnosis not present

## 2019-10-18 DIAGNOSIS — M4807 Spinal stenosis, lumbosacral region: Secondary | ICD-10-CM | POA: Diagnosis not present

## 2019-10-18 DIAGNOSIS — I480 Paroxysmal atrial fibrillation: Secondary | ICD-10-CM | POA: Diagnosis not present

## 2019-10-18 DIAGNOSIS — G8929 Other chronic pain: Secondary | ICD-10-CM | POA: Diagnosis not present

## 2019-10-18 DIAGNOSIS — G629 Polyneuropathy, unspecified: Secondary | ICD-10-CM | POA: Diagnosis not present

## 2019-10-18 DIAGNOSIS — I509 Heart failure, unspecified: Secondary | ICD-10-CM | POA: Diagnosis not present

## 2019-10-18 DIAGNOSIS — J449 Chronic obstructive pulmonary disease, unspecified: Secondary | ICD-10-CM | POA: Diagnosis not present

## 2019-10-18 DIAGNOSIS — E876 Hypokalemia: Secondary | ICD-10-CM | POA: Diagnosis not present

## 2019-10-19 ENCOUNTER — Other Ambulatory Visit (HOSPITAL_COMMUNITY): Payer: BC Managed Care – PPO | Admitting: Nurse Practitioner

## 2019-10-20 DIAGNOSIS — M545 Low back pain: Secondary | ICD-10-CM | POA: Diagnosis not present

## 2019-10-20 DIAGNOSIS — M5136 Other intervertebral disc degeneration, lumbar region: Secondary | ICD-10-CM | POA: Diagnosis not present

## 2019-10-20 DIAGNOSIS — I480 Paroxysmal atrial fibrillation: Secondary | ICD-10-CM | POA: Diagnosis not present

## 2019-10-20 DIAGNOSIS — G8929 Other chronic pain: Secondary | ICD-10-CM | POA: Diagnosis not present

## 2019-10-20 DIAGNOSIS — F332 Major depressive disorder, recurrent severe without psychotic features: Secondary | ICD-10-CM | POA: Diagnosis not present

## 2019-10-20 DIAGNOSIS — R262 Difficulty in walking, not elsewhere classified: Secondary | ICD-10-CM | POA: Diagnosis not present

## 2019-10-20 DIAGNOSIS — M1990 Unspecified osteoarthritis, unspecified site: Secondary | ICD-10-CM | POA: Diagnosis not present

## 2019-10-20 DIAGNOSIS — J449 Chronic obstructive pulmonary disease, unspecified: Secondary | ICD-10-CM | POA: Diagnosis not present

## 2019-10-20 DIAGNOSIS — I872 Venous insufficiency (chronic) (peripheral): Secondary | ICD-10-CM | POA: Diagnosis not present

## 2019-10-20 DIAGNOSIS — G629 Polyneuropathy, unspecified: Secondary | ICD-10-CM | POA: Diagnosis not present

## 2019-10-20 DIAGNOSIS — M4807 Spinal stenosis, lumbosacral region: Secondary | ICD-10-CM | POA: Diagnosis not present

## 2019-10-20 DIAGNOSIS — I110 Hypertensive heart disease with heart failure: Secondary | ICD-10-CM | POA: Diagnosis not present

## 2019-10-20 DIAGNOSIS — E876 Hypokalemia: Secondary | ICD-10-CM | POA: Diagnosis not present

## 2019-10-20 DIAGNOSIS — I509 Heart failure, unspecified: Secondary | ICD-10-CM | POA: Diagnosis not present

## 2019-10-20 DIAGNOSIS — M797 Fibromyalgia: Secondary | ICD-10-CM | POA: Diagnosis not present

## 2019-10-21 ENCOUNTER — Other Ambulatory Visit (HOSPITAL_COMMUNITY): Payer: BC Managed Care – PPO | Admitting: Nurse Practitioner

## 2019-10-22 DIAGNOSIS — R262 Difficulty in walking, not elsewhere classified: Secondary | ICD-10-CM | POA: Diagnosis not present

## 2019-10-22 DIAGNOSIS — M797 Fibromyalgia: Secondary | ICD-10-CM | POA: Diagnosis not present

## 2019-10-22 DIAGNOSIS — E876 Hypokalemia: Secondary | ICD-10-CM | POA: Diagnosis not present

## 2019-10-22 DIAGNOSIS — G8929 Other chronic pain: Secondary | ICD-10-CM | POA: Diagnosis not present

## 2019-10-22 DIAGNOSIS — F332 Major depressive disorder, recurrent severe without psychotic features: Secondary | ICD-10-CM | POA: Diagnosis not present

## 2019-10-22 DIAGNOSIS — I872 Venous insufficiency (chronic) (peripheral): Secondary | ICD-10-CM | POA: Diagnosis not present

## 2019-10-22 DIAGNOSIS — G629 Polyneuropathy, unspecified: Secondary | ICD-10-CM | POA: Diagnosis not present

## 2019-10-22 DIAGNOSIS — I509 Heart failure, unspecified: Secondary | ICD-10-CM | POA: Diagnosis not present

## 2019-10-22 DIAGNOSIS — M545 Low back pain: Secondary | ICD-10-CM | POA: Diagnosis not present

## 2019-10-22 DIAGNOSIS — I480 Paroxysmal atrial fibrillation: Secondary | ICD-10-CM | POA: Diagnosis not present

## 2019-10-22 DIAGNOSIS — M5136 Other intervertebral disc degeneration, lumbar region: Secondary | ICD-10-CM | POA: Diagnosis not present

## 2019-10-22 DIAGNOSIS — I110 Hypertensive heart disease with heart failure: Secondary | ICD-10-CM | POA: Diagnosis not present

## 2019-10-22 DIAGNOSIS — J449 Chronic obstructive pulmonary disease, unspecified: Secondary | ICD-10-CM | POA: Diagnosis not present

## 2019-10-22 DIAGNOSIS — M1990 Unspecified osteoarthritis, unspecified site: Secondary | ICD-10-CM | POA: Diagnosis not present

## 2019-10-22 DIAGNOSIS — M4807 Spinal stenosis, lumbosacral region: Secondary | ICD-10-CM | POA: Diagnosis not present

## 2019-10-25 DIAGNOSIS — I110 Hypertensive heart disease with heart failure: Secondary | ICD-10-CM | POA: Diagnosis not present

## 2019-10-25 DIAGNOSIS — M5136 Other intervertebral disc degeneration, lumbar region: Secondary | ICD-10-CM | POA: Diagnosis not present

## 2019-10-25 DIAGNOSIS — G8929 Other chronic pain: Secondary | ICD-10-CM | POA: Diagnosis not present

## 2019-10-25 DIAGNOSIS — I480 Paroxysmal atrial fibrillation: Secondary | ICD-10-CM | POA: Diagnosis not present

## 2019-10-25 DIAGNOSIS — R262 Difficulty in walking, not elsewhere classified: Secondary | ICD-10-CM | POA: Diagnosis not present

## 2019-10-25 DIAGNOSIS — M1990 Unspecified osteoarthritis, unspecified site: Secondary | ICD-10-CM | POA: Diagnosis not present

## 2019-10-25 DIAGNOSIS — F332 Major depressive disorder, recurrent severe without psychotic features: Secondary | ICD-10-CM | POA: Diagnosis not present

## 2019-10-25 DIAGNOSIS — I509 Heart failure, unspecified: Secondary | ICD-10-CM | POA: Diagnosis not present

## 2019-10-25 DIAGNOSIS — E876 Hypokalemia: Secondary | ICD-10-CM | POA: Diagnosis not present

## 2019-10-25 DIAGNOSIS — M797 Fibromyalgia: Secondary | ICD-10-CM | POA: Diagnosis not present

## 2019-10-25 DIAGNOSIS — G629 Polyneuropathy, unspecified: Secondary | ICD-10-CM | POA: Diagnosis not present

## 2019-10-25 DIAGNOSIS — M545 Low back pain: Secondary | ICD-10-CM | POA: Diagnosis not present

## 2019-10-25 DIAGNOSIS — J449 Chronic obstructive pulmonary disease, unspecified: Secondary | ICD-10-CM | POA: Diagnosis not present

## 2019-10-25 DIAGNOSIS — I872 Venous insufficiency (chronic) (peripheral): Secondary | ICD-10-CM | POA: Diagnosis not present

## 2019-10-25 DIAGNOSIS — M4807 Spinal stenosis, lumbosacral region: Secondary | ICD-10-CM | POA: Diagnosis not present

## 2019-10-27 ENCOUNTER — Ambulatory Visit (HOSPITAL_COMMUNITY)
Admission: RE | Admit: 2019-10-27 | Discharge: 2019-10-27 | Disposition: A | Discharge status: Home / Self Care | Payer: BC Managed Care – PPO | Source: Ambulatory Visit | Attending: Nurse Practitioner | Admitting: Nurse Practitioner

## 2019-10-27 ENCOUNTER — Other Ambulatory Visit: Payer: Self-pay

## 2019-10-27 DIAGNOSIS — I4819 Other persistent atrial fibrillation: Secondary | ICD-10-CM | POA: Diagnosis not present

## 2019-10-27 LAB — BASIC METABOLIC PANEL
Anion gap: 10 (ref 5–15)
BUN: 21 mg/dL — ABNORMAL HIGH (ref 6–20)
CO2: 22 mmol/L (ref 22–32)
Calcium: 9 mg/dL (ref 8.9–10.3)
Chloride: 110 mmol/L (ref 98–111)
Creatinine, Ser: 1.01 mg/dL — ABNORMAL HIGH (ref 0.44–1.00)
GFR calc Af Amer: >60 mL/min (ref >60)
GFR calc non Af Amer: >60 mL/min (ref >60)
Glucose, Bld: 99 mg/dL (ref 70–99)
Potassium: 3.9 mmol/L (ref 3.5–5.1)
Sodium: 142 mmol/L (ref 135–145)

## 2019-11-01 DIAGNOSIS — G629 Polyneuropathy, unspecified: Secondary | ICD-10-CM | POA: Diagnosis not present

## 2019-11-01 DIAGNOSIS — F332 Major depressive disorder, recurrent severe without psychotic features: Secondary | ICD-10-CM | POA: Diagnosis not present

## 2019-11-01 DIAGNOSIS — M5136 Other intervertebral disc degeneration, lumbar region: Secondary | ICD-10-CM | POA: Diagnosis not present

## 2019-11-01 DIAGNOSIS — E876 Hypokalemia: Secondary | ICD-10-CM | POA: Diagnosis not present

## 2019-11-01 DIAGNOSIS — R262 Difficulty in walking, not elsewhere classified: Secondary | ICD-10-CM | POA: Diagnosis not present

## 2019-11-01 DIAGNOSIS — J449 Chronic obstructive pulmonary disease, unspecified: Secondary | ICD-10-CM | POA: Diagnosis not present

## 2019-11-01 DIAGNOSIS — I480 Paroxysmal atrial fibrillation: Secondary | ICD-10-CM | POA: Diagnosis not present

## 2019-11-01 DIAGNOSIS — M797 Fibromyalgia: Secondary | ICD-10-CM | POA: Diagnosis not present

## 2019-11-01 DIAGNOSIS — I872 Venous insufficiency (chronic) (peripheral): Secondary | ICD-10-CM | POA: Diagnosis not present

## 2019-11-01 DIAGNOSIS — I110 Hypertensive heart disease with heart failure: Secondary | ICD-10-CM | POA: Diagnosis not present

## 2019-11-01 DIAGNOSIS — M4807 Spinal stenosis, lumbosacral region: Secondary | ICD-10-CM | POA: Diagnosis not present

## 2019-11-01 DIAGNOSIS — G8929 Other chronic pain: Secondary | ICD-10-CM | POA: Diagnosis not present

## 2019-11-01 DIAGNOSIS — M545 Low back pain: Secondary | ICD-10-CM | POA: Diagnosis not present

## 2019-11-01 DIAGNOSIS — M1990 Unspecified osteoarthritis, unspecified site: Secondary | ICD-10-CM | POA: Diagnosis not present

## 2019-11-01 DIAGNOSIS — I509 Heart failure, unspecified: Secondary | ICD-10-CM | POA: Diagnosis not present

## 2019-11-02 DIAGNOSIS — I480 Paroxysmal atrial fibrillation: Secondary | ICD-10-CM | POA: Diagnosis not present

## 2019-11-02 DIAGNOSIS — M5136 Other intervertebral disc degeneration, lumbar region: Secondary | ICD-10-CM | POA: Diagnosis not present

## 2019-11-02 DIAGNOSIS — I872 Venous insufficiency (chronic) (peripheral): Secondary | ICD-10-CM | POA: Diagnosis not present

## 2019-11-02 DIAGNOSIS — E876 Hypokalemia: Secondary | ICD-10-CM | POA: Diagnosis not present

## 2019-11-02 DIAGNOSIS — M545 Low back pain: Secondary | ICD-10-CM | POA: Diagnosis not present

## 2019-11-02 DIAGNOSIS — G629 Polyneuropathy, unspecified: Secondary | ICD-10-CM | POA: Diagnosis not present

## 2019-11-02 DIAGNOSIS — G8929 Other chronic pain: Secondary | ICD-10-CM | POA: Diagnosis not present

## 2019-11-02 DIAGNOSIS — M1990 Unspecified osteoarthritis, unspecified site: Secondary | ICD-10-CM | POA: Diagnosis not present

## 2019-11-02 DIAGNOSIS — M4807 Spinal stenosis, lumbosacral region: Secondary | ICD-10-CM | POA: Diagnosis not present

## 2019-11-02 DIAGNOSIS — M797 Fibromyalgia: Secondary | ICD-10-CM | POA: Diagnosis not present

## 2019-11-02 DIAGNOSIS — I509 Heart failure, unspecified: Secondary | ICD-10-CM | POA: Diagnosis not present

## 2019-11-02 DIAGNOSIS — J449 Chronic obstructive pulmonary disease, unspecified: Secondary | ICD-10-CM | POA: Diagnosis not present

## 2019-11-02 DIAGNOSIS — I110 Hypertensive heart disease with heart failure: Secondary | ICD-10-CM | POA: Diagnosis not present

## 2019-11-02 DIAGNOSIS — R262 Difficulty in walking, not elsewhere classified: Secondary | ICD-10-CM | POA: Diagnosis not present

## 2019-11-02 DIAGNOSIS — F332 Major depressive disorder, recurrent severe without psychotic features: Secondary | ICD-10-CM | POA: Diagnosis not present

## 2019-11-06 DIAGNOSIS — G4733 Obstructive sleep apnea (adult) (pediatric): Secondary | ICD-10-CM | POA: Diagnosis not present

## 2019-11-15 ENCOUNTER — Telehealth: Payer: Self-pay | Admitting: Physician Assistant

## 2019-11-15 DIAGNOSIS — G8929 Other chronic pain: Secondary | ICD-10-CM | POA: Diagnosis not present

## 2019-11-15 DIAGNOSIS — I480 Paroxysmal atrial fibrillation: Secondary | ICD-10-CM

## 2019-11-15 DIAGNOSIS — K219 Gastro-esophageal reflux disease without esophagitis: Secondary | ICD-10-CM

## 2019-11-15 DIAGNOSIS — Z9181 History of falling: Secondary | ICD-10-CM

## 2019-11-15 DIAGNOSIS — G43909 Migraine, unspecified, not intractable, without status migrainosus: Secondary | ICD-10-CM

## 2019-11-15 DIAGNOSIS — E559 Vitamin D deficiency, unspecified: Secondary | ICD-10-CM

## 2019-11-15 DIAGNOSIS — M545 Low back pain: Secondary | ICD-10-CM | POA: Diagnosis not present

## 2019-11-15 DIAGNOSIS — R262 Difficulty in walking, not elsewhere classified: Secondary | ICD-10-CM | POA: Diagnosis not present

## 2019-11-15 DIAGNOSIS — Z7901 Long term (current) use of anticoagulants: Secondary | ICD-10-CM

## 2019-11-15 DIAGNOSIS — I509 Heart failure, unspecified: Secondary | ICD-10-CM

## 2019-11-15 DIAGNOSIS — M797 Fibromyalgia: Secondary | ICD-10-CM

## 2019-11-15 DIAGNOSIS — G629 Polyneuropathy, unspecified: Secondary | ICD-10-CM

## 2019-11-15 DIAGNOSIS — M1990 Unspecified osteoarthritis, unspecified site: Secondary | ICD-10-CM

## 2019-11-15 DIAGNOSIS — E876 Hypokalemia: Secondary | ICD-10-CM

## 2019-11-15 DIAGNOSIS — F332 Major depressive disorder, recurrent severe without psychotic features: Secondary | ICD-10-CM

## 2019-11-15 DIAGNOSIS — M4807 Spinal stenosis, lumbosacral region: Secondary | ICD-10-CM

## 2019-11-15 DIAGNOSIS — J449 Chronic obstructive pulmonary disease, unspecified: Secondary | ICD-10-CM

## 2019-11-15 DIAGNOSIS — M5136 Other intervertebral disc degeneration, lumbar region: Secondary | ICD-10-CM

## 2019-11-15 DIAGNOSIS — I110 Hypertensive heart disease with heart failure: Secondary | ICD-10-CM

## 2019-11-15 DIAGNOSIS — M7741 Metatarsalgia, right foot: Secondary | ICD-10-CM

## 2019-11-15 DIAGNOSIS — I872 Venous insufficiency (chronic) (peripheral): Secondary | ICD-10-CM | POA: Diagnosis not present

## 2019-11-15 NOTE — Telephone Encounter (Signed)
Paperwork and charge sheet in your folder for review and signature

## 2019-11-15 NOTE — Telephone Encounter (Signed)
I have placed a plan of care from [facility] home care in the bin up front with a charge sheet

## 2019-11-16 ENCOUNTER — Other Ambulatory Visit: Payer: Self-pay | Admitting: Physician Assistant

## 2019-11-16 NOTE — Telephone Encounter (Signed)
Tramadol last Rx 08/25/2019 #60 LOV 09/09/2019 acute

## 2019-11-16 NOTE — Telephone Encounter (Signed)
Paperwork completed, signed and faxed. Charge sheet given to front desk staff.

## 2019-11-26 DIAGNOSIS — G4733 Obstructive sleep apnea (adult) (pediatric): Secondary | ICD-10-CM | POA: Diagnosis not present

## 2019-11-29 DIAGNOSIS — M4807 Spinal stenosis, lumbosacral region: Secondary | ICD-10-CM | POA: Diagnosis not present

## 2019-11-29 DIAGNOSIS — G8929 Other chronic pain: Secondary | ICD-10-CM | POA: Diagnosis not present

## 2019-11-29 DIAGNOSIS — M1990 Unspecified osteoarthritis, unspecified site: Secondary | ICD-10-CM | POA: Diagnosis not present

## 2019-12-07 DIAGNOSIS — G4733 Obstructive sleep apnea (adult) (pediatric): Secondary | ICD-10-CM | POA: Diagnosis not present

## 2019-12-12 ENCOUNTER — Other Ambulatory Visit: Payer: Self-pay | Admitting: Physician Assistant

## 2019-12-12 DIAGNOSIS — K219 Gastro-esophageal reflux disease without esophagitis: Secondary | ICD-10-CM

## 2019-12-29 DIAGNOSIS — M5417 Radiculopathy, lumbosacral region: Secondary | ICD-10-CM | POA: Diagnosis not present

## 2019-12-29 DIAGNOSIS — G3184 Mild cognitive impairment, so stated: Secondary | ICD-10-CM | POA: Diagnosis not present

## 2019-12-29 DIAGNOSIS — R201 Hypoesthesia of skin: Secondary | ICD-10-CM | POA: Diagnosis not present

## 2019-12-29 DIAGNOSIS — G43909 Migraine, unspecified, not intractable, without status migrainosus: Secondary | ICD-10-CM | POA: Diagnosis not present

## 2020-03-31 NOTE — Progress Notes (Signed)
CARDIOLOGY OFFICE NOTE  Date:  04/05/2020    Regina Morales Date of Birth: Jul 23, 1961 Medical Record #[id number]  PCP:  Waldon Merl, PA-C  Cardiologist:  Eden Emms  No chief complaint on file.   History of Present Illness: Regina Morales is a 59 y.o. female who presents today for f/U PAF and atypical chest pain. I have not seen her in over 2 years primary seen by afib clinic and EP Dr Elberta Fortis   She has a history of chest pain with a history of paroxysmal atrial fibrillation, CP, neg myoview in 2009, GERD, DVT and PE after childbirth    Cath 04/17/17 no CAD  Echo same day normal EF 55-60% mild LAE  Last seen by Sebastian Ache 08/27/19 She had fallen and broke both feet Could not ambulate and was at nursing facility QT was elevated on Tikosyn and Lexapro changed to Welbutrin Last ECG 09/23/19 SR rate 77 with QT 434 msec BMET on 10/27/19 K 3.9 Cr 1.01. Mg normal on 09/23/19   She has 4 children. 26 yo daughter had sudden death and survived Has AICD She lost a 34 yo daughter to sudden death last year  She has two son's one in [place] and one in [place] who are currently ok  She has some printed strips from her apple watch which showed PACl/ PVC;s  She had COVID in November and does not want vaccine    Past Medical History:  Diagnosis Date   Anemia    hx years ago   Buzzing in ear    "right; even after OR"   Chest pain    a. 2009: neg MV  ([place])   Depression    DVT (deep vein thrombosis) in pregnancy 1980's   LLE   Fibromyalgia    "dx'd many many years ago; I haven't had any problems w/it" (04/15/2017)   GERD (gastroesophageal reflux disease)    Hepatic steatosis    High cholesterol    History of blood transfusion    "low count after appendix surgery" unsure # of units transfused   Hypertension    Migraines    "maybe once/2 months" (04/15/2017)   Obesity    OSA on CPAP    Persistent atrial fibrillation ([facility])    a. Dx 07/2013;  b. CHA2DS2VASc=1  (female);  c. 07/2013 Echo:  EF 50-55%, no rwma, mod LVH.   Pulmonary embolism ([facility]) ~ 1984   "after I had my last child"   SVD (spontaneous vaginal delivery)    x 4    Past Surgical History:  Procedure Laterality Date   APPENDECTOMY  1990's   COLONOSCOPY     DILATATION & CURETTAGE/HYSTEROSCOPY WITH MYOSURE N/A 04/14/2017   Procedure: DILATATION & CURETTAGE/HYSTEROSCOPY WITH MYOSURE;  Surgeon: Myna Hidalgo, DO;  Location: [facility];  Service: Gynecology;  Laterality: N/A;  Polypectomy   DILATION AND CURETTAGE OF UTERUS  1983   mab   INNER EAR SURGERY Right 2000's   LEFT HEART CATH AND CORONARY ANGIOGRAPHY N/A 04/17/2017   Procedure: Left Heart Cath and Coronary Angiography;  Surgeon: Corky Crafts, MD;  Location: [facility] INVASIVE CV LAB;  Service: Cardiovascular;  Laterality: N/A;   PLANTAR FASCIA RELEASE Right 07/2015   TUBAL LIGATION  1985     Medications: Current Meds  Medication Sig   apixaban (ELIQUIS) 5 MG TABS tablet TAKE 1 TABLET(5 MG) BY MOUTH TWICE DAILY   buPROPion (WELLBUTRIN SR) 150 MG 12 hr tablet Take 1 tablet (150  mg total) by mouth 2 (two) times daily.   diltiazem (CARDIZEM) 30 MG tablet Take 1 tablet every 4 hours AS NEEDED for AFIB heart rate >100   dofetilide (TIKOSYN) 500 MCG capsule TAKE 1 CAPSULE TWICE A DAY   KLOR-CON M20 20 MEQ tablet Take 3 tablets (60 mEq total) by mouth daily.   metoprolol tartrate (LOPRESSOR) 50 MG tablet Take 1 tablet (50 mg total) by mouth 2 (two) times daily.   pantoprazole (PROTONIX) 40 MG tablet TAKE 1 TABLET DAILY   pregabalin (LYRICA) 100 MG capsule TAKE 1 CAPSULE BY MOUTH 3 TIMES A DAY FOR 30 DAYS   promethazine (PHENERGAN) 25 MG tablet as needed.    REXULTI 1 MG TABS tablet Take 1 mg by mouth at bedtime.   rizatriptan (MAXALT) 10 MG tablet Take 10 mg by mouth as needed for migraine. May repeat in 2 hours if needed   Tiotropium Bromide-Olodaterol (STIOLTO RESPIMAT) 2.5-2.5 MCG/ACT AERS Inhale 1 Inhaler into  the lungs daily. Insert 2 puffs in the lungs daily   tiZANidine (ZANAFLEX) 4 MG tablet Take 4 mg by mouth as needed.    topiramate (TOPAMAX) 100 MG tablet Take 100 mg by mouth daily.   torsemide (DEMADEX) 20 MG tablet Take 2 tablets (40 mg total) by mouth daily.   traMADol (ULTRAM) 50 MG tablet TAKE 1 TABLET EVERY 12 HOURS AS NEEDED   Vitamin D, Ergocalciferol, (DRISDOL) 1.25 MG (50000 UT) CAPS capsule TAKE 1 CAPSULE ONCE A WEEK     Allergies: Allergies  Allergen Reactions   Codeine Nausea And Vomiting, Other (See Comments) and Nausea Only    Social History: The patient  reports that she quit smoking about 29 years ago. Her smoking use included cigarettes. She has a 8.00 pack-year smoking history. She has never used smokeless tobacco. She reports previous alcohol use. She reports that she does not use drugs.   Family History: The patient's family history includes Alcohol abuse in her father; Atrial fibrillation in her brother; Cardiomyopathy in her daughter; Congestive Heart Failure in her mother; Kidney failure in her mother; Lung cancer in her father; Sudden Cardiac Death in her daughter; Wolff Parkinson White syndrome in her grandchild.   Review of Systems: Please see the history of present illness.   Otherwise, the review of systems is positive for none.   All other systems are reviewed and negative.   Physical Exam: VS:  BP 130/90    Pulse 74    Ht 5\' 7"  (1.702 m)    Wt (!) 331 lb (150.1 kg)    SpO2 99%    BMI 51.84 kg/m  .  BMI Body mass index is 51.84 kg/m.  Wt Readings from Last 3 Encounters:  04/05/20 (!) 331 lb (150.1 kg)  08/27/19 (!) 335 lb 3.2 oz (152 kg)  08/10/19 (!) 329 lb (149.2 kg)   Affect appropriate Obese white female  HEENT: normal Neck supple with no adenopathy JVP normal no bruits no thyromegaly Lungs clear with no wheezing and good diaphragmatic motion Heart:  S1/S2 no murmur, no rub, gallop or click PMI normal Abdomen: benighn, BS positve, no  tenderness, no AAA no bruit.  No HSM or HJR Distal pulses intact with no bruits No edema Neuro non-focal Skin warm and dry No muscular weakness    LABORATORY DATA:  EKG: . Reviewed apple watch strips see HPI  Lab Results  Component Value Date   WBC 7.4 08/10/2019   HGB 12.3 08/10/2019   HCT 37.9 08/10/2019  PLT 285.0 08/10/2019   GLUCOSE 99 10/27/2019   CHOL 186 04/23/2014   TRIG 122 04/23/2014   HDL 53 04/23/2014   LDLCALC 109 (H) 04/23/2014   ALT 53 (H) 08/20/2019   AST 48 (H) 08/20/2019   NA 142 10/27/2019   K 3.9 10/27/2019   CL 110 10/27/2019   CREATININE 1.01 (H) 10/27/2019   BUN 21 (H) 10/27/2019   CO2 22 10/27/2019   TSH 0.77 08/10/2019   INR 1.0 03/22/2019   HGBA1C 5.3 04/22/2014     BNP (last 3 results) No results for input(s): BNP in the last 8760 hours.  ProBNP (last 3 results) Recent Labs    08/10/19 1202  PROBNP 17.0     Other Studies Reviewed Today:  Echo Study Conclusions 04/2017  - Left ventricle: The cavity size was normal. Systolic function was   normal. The estimated ejection fraction was in the range of 55%   to 60%. Wall motion was normal; there were no regional wall   motion abnormalities. - Aortic valve: There was trivial regurgitation. - Left atrium: The atrium was mildly dilated.   Left Heart Cath and Coronary Angiography 04/2017  Conclusion     The left ventricular systolic function is normal.  LV end diastolic pressure is moderately elevated. LVEDP 25 mm Hg.  The left ventricular ejection fraction is 50-55% by visual estimate.  There is no aortic valve stenosis.  No angiographically apparent coronary artery disease.   Consider diuresis.  Continue preventive therapy.    CT CHEST ANGIOGRAM IMPRESSION: 1. No evidence of pulmonary embolus. 2. Bilateral dependent subsegmental atelectasis noted. Lungs otherwise clear.   Electronically Signed   By: Garald Balding M.D.   On: 04/15/2017  01:50  Assessment/Plan:  1. Chest pain - non cardiac cath 04/17/17 no CAD   2. PAF - maintaining NSR on tikosyn and eliquis   3. Prior PE/DVT - most recent CT negative 04/15/17 on eliquis for PAF   4. Obesity - weight loss encouraged. Referred to [place] [facility]   5. OSA:  F/u with primary for CPAP titration   6. HTN - controlled on her current regimen  7. Ortho:  F/u Apolonio Schneiders has had MRI needs PT/OT Has lumbar spine disease as well Activity limited   8. Family History of Sudden Death. Her two daughters have had events and they have different fathers so likely runs on patients side. Will have her f/u with geneticist for possible testing Will also have her see Dr Curt Bears to discuss ? Despite her size may benefit from cardiac MRI    Jenkins Rouge

## 2020-04-05 ENCOUNTER — Encounter: Payer: Self-pay | Admitting: Cardiovascular Disease

## 2020-04-05 ENCOUNTER — Ambulatory Visit (INDEPENDENT_AMBULATORY_CARE_PROVIDER_SITE_OTHER): Payer: BC Managed Care – PPO | Admitting: Cardiovascular Disease

## 2020-04-05 ENCOUNTER — Other Ambulatory Visit: Payer: Self-pay

## 2020-04-05 VITALS — BP 130/90 | HR 74 | Ht 67.0 in | Wt 331.0 lb

## 2020-04-05 DIAGNOSIS — Z8489 Family history of other specified conditions: Secondary | ICD-10-CM | POA: Diagnosis not present

## 2020-04-05 DIAGNOSIS — I480 Paroxysmal atrial fibrillation: Secondary | ICD-10-CM | POA: Diagnosis not present

## 2020-04-05 NOTE — Patient Instructions (Signed)
Medication Instructions:  *If you need a refill on your cardiac medications before your next appointment, please call your pharmacy*  Lab Work: If you have labs (blood work) drawn today and your tests are completely normal, you will receive your results only by: Marland Kitchen MyChart Message (if you have MyChart) OR . A paper copy in the mail If you have any lab test that is abnormal or we need to change your treatment, we will call you to review the results.  Testing/Procedures: None ordered today.  Follow-Up: Your physician recommends that you schedule a follow-up appointment next available with Dr. Elberta Fortis.  You have been referred to Dr. Sidney Ace  At [facility], you and your health needs are our priority.  As part of our continuing mission to provide you with exceptional heart care, we have created designated Provider Care Teams.  These Care Teams include your primary Cardiologist (physician) and Advanced Practice Providers (APPs -  Physician Assistants and Nurse Practitioners) who all work together to provide you with the care you need, when you need it.  We recommend signing up for the patient portal called "MyChart".  Sign up information is provided on this After Visit Summary.  MyChart is used to connect with patients for Virtual Visits (Telemedicine).  Patients are able to view lab/test results, encounter notes, upcoming appointments, etc.  Non-urgent messages can be sent to your provider as well.   To learn more about what you can do with MyChart, go to [email or web address].    Your next appointment:   6 month(s)  The format for your next appointment:   In Person  Provider:   You may see [name], MD or one of the following Advanced Practice Providers on your designated Care Team:    Norma Fredrickson, NP  Nada Boozer, NP  Georgie Chard, NP

## 2020-04-13 ENCOUNTER — Ambulatory Visit (INDEPENDENT_AMBULATORY_CARE_PROVIDER_SITE_OTHER): Payer: BC Managed Care – PPO | Admitting: Cardiology

## 2020-04-13 ENCOUNTER — Other Ambulatory Visit: Payer: Self-pay

## 2020-04-13 ENCOUNTER — Encounter: Payer: Self-pay | Admitting: Cardiology

## 2020-04-13 ENCOUNTER — Other Ambulatory Visit: Payer: Self-pay | Admitting: *Deleted

## 2020-04-13 VITALS — BP 134/82 | HR 78 | Ht 67.0 in | Wt 331.2 lb

## 2020-04-13 DIAGNOSIS — I480 Paroxysmal atrial fibrillation: Secondary | ICD-10-CM

## 2020-04-13 DIAGNOSIS — Z79899 Other long term (current) drug therapy: Secondary | ICD-10-CM

## 2020-04-13 LAB — BASIC METABOLIC PANEL
BUN/Creatinine Ratio: 14 (ref 9–23)
BUN: 9 mg/dL (ref 6–24)
CO2: 22 mmol/L (ref 20–29)
Calcium: 9 mg/dL (ref 8.7–10.2)
Chloride: 105 mmol/L (ref 96–106)
Creatinine, Ser: 0.66 mg/dL (ref 0.57–1.00)
GFR calc Af Amer: 113 mL/min/{1.73_m2} (ref >59)
GFR calc non Af Amer: 98 mL/min/{1.73_m2} (ref >59)
Glucose: 126 mg/dL — ABNORMAL HIGH (ref 65–99)
Potassium: 3.7 mmol/L (ref 3.5–5.2)
Sodium: 141 mmol/L (ref 134–144)

## 2020-04-13 LAB — MAGNESIUM: Magnesium: 2.1 mg/dL (ref 1.6–2.3)

## 2020-04-13 NOTE — Patient Instructions (Signed)
Medication Instructions:  Your physician recommends that you continue on your current medications as directed. Please refer to the Current Medication list given to you today.  *If you need a refill on your cardiac medications before your next appointment, please call your pharmacy*   Lab Work: Today: BMET & Magnesium level If you have labs (blood work) drawn today and your tests are completely normal, you will receive your results only by:  MyChart Message (if you have MyChart) OR  A paper copy in the mail If you have any lab test that is abnormal or we need to change your treatment, we will call you to review the results.   Testing/Procedures: None ordered   Follow-Up: At [facility], you and your health needs are our priority.  As part of our continuing mission to provide you with exceptional heart care, we have created designated Provider Care Teams.  These Care Teams include your primary Cardiologist (physician) and Advanced Practice Providers (APPs -  Physician Assistants and Nurse Practitioners) who all work together to provide you with the care you need, when you need it.  We recommend signing up for the patient portal called "MyChart".  Sign up information is provided on this After Visit Summary.  MyChart is used to connect with patients for Virtual Visits (Telemedicine).  Patients are able to view lab/test results, encounter notes, upcoming appointments, etc.  Non-urgent messages can be sent to your provider as well.   To learn more about what you can do with MyChart, go to [email or web address].    Your next appointment:   6 month(s)  The format for your next appointment:   In Person  Provider:   Loman Brooklyn, MD   Thank you for choosing [facility]!!   Dory Horn, RN [phone]    Other Instructions

## 2020-04-13 NOTE — Progress Notes (Signed)
Electrophysiology Office Note   Date:  04/13/2020   ID:  Regina Morales, DOB Jul 12, 1961, MRN [id number]  PCP:  Noel Journey  Cardiologist:  Eden Emms Primary Electrophysiologist:  Dr Elberta Fortis    CC: Follow up for paroxsymal atrial fibrillation.   History of Present Illness: Regina Morales is a 59 y.o. female who is being seen today for the evaluation of atrial fibrillation at the request of Rudi Coco. Presenting today for electrophysiology evaluation.  She has a history of paroxysmal atrial fibrillation, hypertension, OSA on CPAP.  She took flecainide for her atrial fibrillation, but had more frequent episodes.  She is avoiding caffeine and alcohol.  She does struggle with weight loss.  She has been in atrial fibrillation once since the increased dose of her beta-blocker.  She was admitted to the hospital 09/15/2018 for dofetilide loading.  Today, denies symptoms of palpitations, chest pain, shortness of breath, orthopnea, PND, lower extremity edema, claudication, dizziness, presyncope, syncope, bleeding, or neurologic sequela. The patient is tolerating medications without difficulties.  She has no cardiac complaints.  She is unaware of any further episodes of atrial fibrillation.  Unfortunately in 03/13/2023, her daughter died of cardiac arrest.  This is the second child that she has had that has had a cardiac arrest.  She is currently in the grieving process, but getting better day by day.   Past Medical History:  Diagnosis Date  . Anemia    hx years ago  . Buzzing in ear    "right; even after OR"  . Chest pain    a. 03/12/08: neg MV  ([facility])  . Depression   . DVT (deep vein thrombosis) in pregnancy 1980's   LLE  . Fibromyalgia    "dx'd many many years ago; I haven't had any problems w/it" (04/15/2017)  . GERD (gastroesophageal reflux disease)   . Hepatic steatosis   . High cholesterol   . History of blood transfusion    "low count after appendix surgery" unsure # of units  transfused  . Hypertension   . Migraines    "maybe once/2 months" (04/15/2017)  . Obesity   . OSA on CPAP   . Persistent atrial fibrillation (HCC)    a. Dx 07/2013;  b. CHA2DS2VASc=1 (female);  c. 07/2013 Echo:  EF 50-55%, no rwma, mod LVH.  . Pulmonary embolism (HCC) ~ 03/13/1983   "after I had my last child"  . SVD (spontaneous vaginal delivery)    x 4   Past Surgical History:  Procedure Laterality Date  . APPENDECTOMY  1990's  . COLONOSCOPY    . DILATATION & CURETTAGE/HYSTEROSCOPY WITH MYOSURE N/A 04/14/2017   Procedure: DILATATION & CURETTAGE/HYSTEROSCOPY WITH MYOSURE;  Surgeon: Myna Hidalgo, DO;  Location: [facility];  Service: Gynecology;  Laterality: N/A;  Polypectomy  . DILATION AND CURETTAGE OF UTERUS  1983   mab  . INNER EAR SURGERY Right 2000's  . LEFT HEART CATH AND CORONARY ANGIOGRAPHY N/A 04/17/2017   Procedure: Left Heart Cath and Coronary Angiography;  Surgeon: Corky Crafts, MD;  Location: [facility] INVASIVE CV LAB;  Service: Cardiovascular;  Laterality: N/A;  . PLANTAR FASCIA RELEASE Right 07/2015  . TUBAL LIGATION  1985     Current Outpatient Medications  Medication Sig Dispense Refill  . apixaban (ELIQUIS) 5 MG TABS tablet TAKE 1 TABLET(5 MG) BY MOUTH TWICE DAILY 180 tablet 2  . diltiazem (CARDIZEM) 30 MG tablet Take 1 tablet every 4 hours AS NEEDED for AFIB heart rate >100 45  tablet 3  . dofetilide (TIKOSYN) 500 MCG capsule TAKE 1 CAPSULE TWICE A DAY 180 capsule 2  . DULoxetine (CYMBALTA) 20 MG capsule Take 20 mg by mouth daily.    Marland Kitchen KLOR-CON M20 20 MEQ tablet Take 3 tablets (60 mEq total) by mouth daily. 270 tablet 2  . metoprolol tartrate (LOPRESSOR) 50 MG tablet Take 1 tablet (50 mg total) by mouth 2 (two) times daily. 180 tablet 3  . pantoprazole (PROTONIX) 40 MG tablet TAKE 1 TABLET DAILY 90 tablet 1  . pregabalin (LYRICA) 100 MG capsule TAKE 1 CAPSULE BY MOUTH 3 TIMES A DAY FOR 30 DAYS    . promethazine (PHENERGAN) 25 MG tablet as needed.     Marland Kitchen REXULTI 1 MG TABS  tablet Take 1 mg by mouth at bedtime.    . rizatriptan (MAXALT) 10 MG tablet Take 10 mg by mouth as needed for migraine. May repeat in 2 hours if needed    . Tiotropium Bromide-Olodaterol (STIOLTO RESPIMAT) 2.5-2.5 MCG/ACT AERS Inhale 1 Inhaler into the lungs daily. Insert 2 puffs in the lungs daily    . tiZANidine (ZANAFLEX) 4 MG tablet Take 4 mg by mouth as needed.     . topiramate (TOPAMAX) 100 MG tablet Take 100 mg by mouth daily.    Marland Kitchen torsemide (DEMADEX) 20 MG tablet Take 2 tablets (40 mg total) by mouth daily. 180 tablet 2  . traMADol (ULTRAM) 50 MG tablet TAKE 1 TABLET EVERY 12 HOURS AS NEEDED 60 tablet 0  . Vitamin D, Ergocalciferol, (DRISDOL) 1.25 MG (50000 UT) CAPS capsule TAKE 1 CAPSULE ONCE A WEEK    . albuterol (VENTOLIN HFA) 108 (90 Base) MCG/ACT inhaler Inhale 2 puffs into the lungs as needed. 2 puffs every 6 hours prn     No current facility-administered medications for this visit.    Allergies:   Codeine   Social History:  The patient  reports that she quit smoking about 29 years ago. Her smoking use included cigarettes. She has a 8.00 pack-year smoking history. She has never used smokeless tobacco. She reports previous alcohol use. She reports that she does not use drugs.   Family History:  The patient's family history includes Alcohol abuse in her father; Atrial fibrillation in her brother; Cardiomyopathy in her daughter; Congestive Heart Failure in her mother; Kidney failure in her mother; Lung cancer in her father; Sudden Cardiac Death in her daughter; Wolff Parkinson White syndrome in her grandchild.   ROS:  Please see the history of present illness.   Otherwise, review of systems is positive for none.   All other systems are reviewed and negative.   PHYSICAL EXAM: VS:  BP 134/82   Pulse 78   Ht 5\' 7"  (1.702 m)   Wt (!) 331 lb 3.2 oz (150.2 kg)   SpO2 96%   BMI 51.87 kg/m  , BMI Body mass index is 51.87 kg/m. GEN: Well nourished, well developed, in no acute  distress  HEENT: normal  Neck: no JVD, carotid bruits, or masses Cardiac: RRR; no murmurs, rubs, or gallops,no edema  Respiratory:  clear to auscultation bilaterally, normal work of breathing GI: soft, nontender, nondistended, + BS MS: no deformity or atrophy  Skin: warm and dry Neuro:  Strength and sensation are intact Psych: euthymic mood, full affect  EKG:  EKG is ordered today. Personal review of the ekg ordered shows sinus rhythm  Recent Labs: 08/10/2019: Hemoglobin 12.3; Platelets 285.0; Pro B Natriuretic peptide (BNP) 17.0; TSH 0.77 08/20/2019: ALT  53 09/23/2019: Magnesium 2.1 10/27/2019: BUN 21; Creatinine, Ser 1.01; Potassium 3.9; Sodium 142    Lipid Panel     Component Value Date/Time   CHOL 186 04/23/2014 0018   TRIG 122 04/23/2014 0018   HDL 53 04/23/2014 0018   CHOLHDL 3.5 04/23/2014 0018   VLDL 24 04/23/2014 0018   LDLCALC 109 (H) 04/23/2014 0018     Wt Readings from Last 3 Encounters:  04/13/20 (!) 331 lb 3.2 oz (150.2 kg)  04/05/20 (!) 331 lb (150.1 kg)  08/27/19 (!) 335 lb 3.2 oz (152 kg)      Other studies Reviewed: Additional studies/ records that were reviewed today include: TTE 04/17/17  Review of the above records today demonstrates:  - Left ventricle: The cavity size was normal. Systolic function was   normal. The estimated ejection fraction was in the range of 55%   to 60%. Wall motion was normal; there were no regional wall   motion abnormalities. - Aortic valve: There was trivial regurgitation. - Left atrium: The atrium was mildly dilated.  LHC 04/17/17  The left ventricular systolic function is normal.  LV end diastolic pressure is moderately elevated. LVEDP 25 mm Hg.  The left ventricular ejection fraction is 50-55% by visual estimate.  There is no aortic valve stenosis.  No angiographically apparent coronary artery disease.   ASSESSMENT AND PLAN:  1.  Paroxysmal atrial fibrillation: Currently on dofetilide and Eliquis.   CHA2DS2-VASc 2.  Fortunately she is remained in sinus rhythm.  We Nilam Quakenbush check dofetilide labs today.  No other changes.  2.  Obstructive sleep apnea: CPAP compliance encouraged  3.  Hypertension: Currently well controlled  4. Obesity: Diet and exercise encouraged Body mass index is 51.87 kg/m.   Current medicines are reviewed at length with the patient today.   The patient does not have concerns regarding her medicines.  The following changes were made today: None  Labs/ tests ordered today include:  Orders Placed This Encounter  Procedures  . Basic metabolic panel  . Magnesium  . EKG 12-Lead    Disposition:   FU with Amaurie Wandel 6 months  Signed, Arun Herrod Meredith Leeds, MD  04/13/2020 8:48 AM     [facility] [address] [place] [address] [phone] (office) [phone] (fax)

## 2020-04-27 ENCOUNTER — Ambulatory Visit: Payer: BC Managed Care – PPO | Admitting: Cardiology

## 2020-04-29 ENCOUNTER — Other Ambulatory Visit: Payer: Self-pay

## 2020-04-29 ENCOUNTER — Encounter (HOSPITAL_COMMUNITY): Payer: Self-pay | Admitting: Emergency Medicine

## 2020-04-29 ENCOUNTER — Emergency Department (EMERGENCY_DEPARTMENT_HOSPITAL)
Admission: EM | Admit: 2020-04-29 | Discharge: 2020-04-30 | Disposition: A | Discharge status: Other Acute Inpatient Hospital | Payer: BC Managed Care – PPO | Source: Home / Self Care

## 2020-04-29 DIAGNOSIS — Z20822 Contact with and (suspected) exposure to covid-19: Secondary | ICD-10-CM | POA: Insufficient documentation

## 2020-04-29 DIAGNOSIS — R45851 Suicidal ideations: Secondary | ICD-10-CM

## 2020-04-29 DIAGNOSIS — Z87891 Personal history of nicotine dependence: Secondary | ICD-10-CM | POA: Insufficient documentation

## 2020-04-29 DIAGNOSIS — I10 Essential (primary) hypertension: Secondary | ICD-10-CM | POA: Insufficient documentation

## 2020-04-29 DIAGNOSIS — F332 Major depressive disorder, recurrent severe without psychotic features: Secondary | ICD-10-CM | POA: Insufficient documentation

## 2020-04-29 DIAGNOSIS — Z79899 Other long term (current) drug therapy: Secondary | ICD-10-CM | POA: Insufficient documentation

## 2020-04-29 LAB — COMPREHENSIVE METABOLIC PANEL
ALT: 55 U/L — ABNORMAL HIGH (ref 0–44)
AST: 69 U/L — ABNORMAL HIGH (ref 15–41)
Albumin: 4 g/dL (ref 3.5–5.0)
Alkaline Phosphatase: 89 U/L (ref 38–126)
Anion gap: 7 (ref 5–15)
BUN: 13 mg/dL (ref 6–20)
CO2: 27 mmol/L (ref 22–32)
Calcium: 9.2 mg/dL (ref 8.9–10.3)
Chloride: 109 mmol/L (ref 98–111)
Creatinine, Ser: 0.77 mg/dL (ref 0.44–1.00)
GFR calc Af Amer: >60 mL/min (ref >60)
GFR calc non Af Amer: >60 mL/min (ref >60)
Glucose, Bld: 105 mg/dL — ABNORMAL HIGH (ref 70–99)
Potassium: 3.9 mmol/L (ref 3.5–5.1)
Sodium: 143 mmol/L (ref 135–145)
Total Bilirubin: 0.6 mg/dL (ref 0.3–1.2)
Total Protein: 7 g/dL (ref 6.5–8.1)

## 2020-04-29 LAB — CBC
HCT: 37.7 % (ref 36.0–46.0)
Hemoglobin: 11.9 g/dL — ABNORMAL LOW (ref 12.0–15.0)
MCH: 28.8 pg (ref 26.0–34.0)
MCHC: 31.6 g/dL (ref 30.0–36.0)
MCV: 91.3 fL (ref 80.0–100.0)
Platelets: 276 10*3/uL (ref 150–400)
RBC: 4.13 MIL/uL (ref 3.87–5.11)
RDW: 13.2 % (ref 11.5–15.5)
WBC: 7.5 10*3/uL (ref 4.0–10.5)
nRBC: 0 % (ref 0.0–0.2)

## 2020-04-29 LAB — ETHANOL: Alcohol, Ethyl (B): <10 mg/dL (ref <10)

## 2020-04-29 LAB — RAPID URINE DRUG SCREEN, HOSP PERFORMED
Amphetamines: NOT DETECTED
Barbiturates: NOT DETECTED
Benzodiazepines: NOT DETECTED
Cocaine: NOT DETECTED
Opiates: NOT DETECTED
Tetrahydrocannabinol: NOT DETECTED

## 2020-04-29 LAB — POC URINE PREG, ED: Preg Test, Ur: NEGATIVE

## 2020-04-29 LAB — ACETAMINOPHEN LEVEL: Acetaminophen (Tylenol), Serum: <10 ug/mL — ABNORMAL LOW (ref 10–30)

## 2020-04-29 LAB — SALICYLATE LEVEL: Salicylate Lvl: <7 mg/dL — ABNORMAL LOW (ref 7.0–30.0)

## 2020-04-29 LAB — SARS CORONAVIRUS 2 BY RT PCR (HOSPITAL ORDER, PERFORMED IN ~~LOC~~ HOSPITAL LAB): SARS Coronavirus 2: NEGATIVE

## 2020-04-29 MED ORDER — DOFETILIDE 250 MCG PO CAPS
500.0000 ug | ORAL_CAPSULE | Freq: Two times a day (BID) | ORAL | Status: DC
  Administered 2020-04-29: 500 ug via ORAL
  Filled 2020-04-29: qty 2

## 2020-04-29 MED ORDER — DILTIAZEM HCL 30 MG PO TABS: 30.0000 mg | ORAL_TABLET | ORAL | Status: DC | PRN

## 2020-04-29 MED ORDER — APIXABAN 5 MG PO TABS
5.0000 mg | ORAL_TABLET | Freq: Two times a day (BID) | ORAL | Status: DC
  Administered 2020-04-29: 5 mg via ORAL
  Filled 2020-04-29: qty 1

## 2020-04-29 MED ORDER — METOPROLOL TARTRATE 25 MG PO TABS
50.0000 mg | ORAL_TABLET | Freq: Two times a day (BID) | ORAL | Status: DC
  Administered 2020-04-29: 50 mg via ORAL
  Filled 2020-04-29: qty 2

## 2020-04-29 MED ORDER — DULOXETINE HCL 20 MG PO CPEP: 40.0000 mg | ORAL_CAPSULE | Freq: Every day | ORAL | Status: DC

## 2020-04-29 MED ORDER — TRAMADOL HCL 50 MG PO TABS
50.0000 mg | ORAL_TABLET | Freq: Four times a day (QID) | ORAL | Status: DC | PRN
  Administered 2020-04-29: 50 mg via ORAL
  Filled 2020-04-29: qty 1

## 2020-04-29 MED ORDER — TIZANIDINE HCL 4 MG PO TABS: 4.0000 mg | ORAL_TABLET | ORAL | Status: DC | PRN

## 2020-04-29 MED ORDER — PREGABALIN 50 MG PO CAPS
50.0000 mg | ORAL_CAPSULE | Freq: Three times a day (TID) | ORAL | Status: DC
  Administered 2020-04-29: 50 mg via ORAL
  Filled 2020-04-29: qty 1

## 2020-04-29 MED ORDER — TORSEMIDE 20 MG PO TABS
40.0000 mg | ORAL_TABLET | Freq: Every day | ORAL | Status: DC
  Administered 2020-04-29: 40 mg via ORAL
  Filled 2020-04-29: qty 2

## 2020-04-29 MED ORDER — BREXPIPRAZOLE 1 MG PO TABS
1.0000 mg | ORAL_TABLET | Freq: Every day | ORAL | Status: DC
  Administered 2020-04-29: 1 mg via ORAL
  Filled 2020-04-29: qty 1

## 2020-04-29 MED ORDER — PANTOPRAZOLE SODIUM 40 MG PO TBEC
40.0000 mg | DELAYED_RELEASE_TABLET | Freq: Every day | ORAL | Status: DC
  Administered 2020-04-29: 40 mg via ORAL
  Filled 2020-04-29: qty 1

## 2020-04-29 MED ORDER — TOPIRAMATE 100 MG PO TABS
100.0000 mg | ORAL_TABLET | Freq: Every day | ORAL | Status: DC
  Administered 2020-04-29: 100 mg via ORAL
  Filled 2020-04-29: qty 1

## 2020-04-29 NOTE — ED Provider Notes (Signed)
[facility]-EMERGENCY DEPT Provider Note   CSN: [id number] Arrival date & time: 04/29/20  1705     History Chief Complaint  Patient presents with  . Suicidal    Regina Morales is a 59 y.o. female.  HPI  Patient is a 59 year old female with a history of depression, anxiety, A. fib that is persistent on rate control, Tikosyn, and DOAC therapy.  She also has a history of HTN, fibromyalgia, obesity, OSA on CPAP  Patient is presented today with dysphoria and depression.  She states that on 01-May-2024one of her 2 daughters died spontaneously of cardiac arrest.  She states that the other 1 had an episode of cardiac arrest but was able to be brought to the emergency department and survived.  She states that ever since this has happened she has been having significantly difficulty sleeping.  She states that she has been tolerating her self and is, with a plan of overdosing on Tikosyn however she told her psychiatrist this and was told to come to the emergency department.  She states that she does want to kill herself and states that she plans to left to her own devices.  She states that she has not taken any medications such as Tylenol, aspirin, Tikosyn and overdose prior to arrival in ED today.  She states that years ago she attempted suicide with Tylenol.  Patient denies any current pain anywhere apart in her back and feet which she states is chronic.  She denies any chest pain, nausea vomiting headache, lightheadedness or dizziness.     Past Medical History:  Diagnosis Date  . Anemia    hx years ago  . Buzzing in ear    "right; even after OR"  . Chest pain    a. 2009: neg MV  ([facility])  . Depression   . DVT (deep vein thrombosis) in pregnancy 1980's   LLE  . Fibromyalgia    "dx'd many many years ago; I haven't had any problems w/it" (04/15/2017)  . GERD (gastroesophageal reflux disease)   . Hepatic steatosis   . High cholesterol   . History of blood transfusion     "low count after appendix surgery" unsure # of units transfused  . Hypertension   . Migraines    "maybe once/2 months" (04/15/2017)  . Obesity   . OSA on CPAP   . Persistent atrial fibrillation (HCC)    a. Dx 07/2013;  b. CHA2DS2VASc=1 (female);  c. 07/2013 Echo:  EF 50-55%, no rwma, mod LVH.  . Pulmonary embolism (HCC) ~ 1984   "after I had my last child"  . SVD (spontaneous vaginal delivery)    x 4    Patient Active Problem List   Diagnosis Date Noted  . OSA (obstructive sleep apnea) 07/27/2019  . Stage 3 severe COPD by GOLD classification (HCC) 06/24/2019  . Fibromyalgia 05/03/2019  . Venous insufficiency of both lower extremities 04/29/2019  . Peripheral neuropathy 04/14/2019  . Visit for monitoring Tikosyn therapy 09/15/2018  . 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 Tendinitis of right elbow 11/25/2017  . Abnormal nuclear stress test   . Essential hypertension   . Endometrial polyp   . Persistent atrial fibrillation (HCC)   . Normocytic anemia 04/24/2014  . Depression 04/22/2014  . Migraine 04/22/2014  . PAF (paroxysmal atrial fibrillation) (HCC)   . GERD (gastroesophageal reflux disease)   . Obesity     Past Surgical History:  Procedure Laterality Date  . APPENDECTOMY  1990's  .  COLONOSCOPY    . DILATATION & CURETTAGE/HYSTEROSCOPY WITH MYOSURE N/A 04/14/2017   Procedure: DILATATION & CURETTAGE/HYSTEROSCOPY WITH MYOSURE;  Surgeon: Myna Hidalgo, DO;  Location: [facility];  Service: Gynecology;  Laterality: N/A;  Polypectomy  . DILATION AND CURETTAGE OF UTERUS  1983   mab  . INNER EAR SURGERY Right 2000's  . LEFT HEART CATH AND CORONARY ANGIOGRAPHY N/A 04/17/2017   Procedure: Left Heart Cath and Coronary Angiography;  Surgeon: Corky Crafts, MD;  Location: [facility] INVASIVE CV LAB;  Service: Cardiovascular;  Laterality: N/A;  . PLANTAR FASCIA RELEASE Right 07/2015  . TUBAL LIGATION  1985     OB History   No obstetric history on file.     Family History  Problem  Relation Age of Onset  . Lung cancer Father   . Alcohol abuse Father   . Kidney failure Mother        s/p renal Tx  . Congestive Heart Failure Mother   . Atrial fibrillation Brother   . Sudden Cardiac Death Daughter        cardiac arrest  . Cardiomyopathy Daughter   . Evelene Croon Parkinson White syndrome Grandchild         granddaughter    Social History   Tobacco Use  . Smoking status: Former Smoker    Packs/day: 1.00    Years: 8.00    Pack years: 8.00    Types: Cigarettes    Quit date: 11/04/1990    Years since quitting: 29.5  . Smokeless tobacco: Never Used  Vaping Use  . Vaping Use: Never used  Substance Use Topics  . Alcohol use: Not Currently  . Drug use: No    Home Medications Prior to Admission medications   Medication Sig Start Date End Date Taking? Authorizing Provider  albuterol (VENTOLIN HFA) 108 (90 Base) MCG/ACT inhaler Inhale 2 puffs into the lungs every 6 (six) hours as needed for wheezing or shortness of breath.   Yes [provider]  apixaban (ELIQUIS) 5 MG TABS tablet TAKE 1 TABLET(5 MG) BY MOUTH TWICE DAILY 08/09/19  Yes Camnitz, Will Daphine Deutscher, MD  diltiazem (CARDIZEM) 30 MG tablet Take 1 tablet every 4 hours AS NEEDED for AFIB heart rate >100 08/09/19  Yes Camnitz, Will Daphine Deutscher, MD  dofetilide (TIKOSYN) 500 MCG capsule TAKE 1 CAPSULE TWICE A DAY 05/28/19  Yes Sheilah Pigeon, PA-C  DULoxetine (CYMBALTA) 20 MG capsule Take 20 mg by mouth daily.   Yes [provider]  metoprolol tartrate (LOPRESSOR) 50 MG tablet Take 1 tablet (50 mg total) by mouth 2 (two) times daily. 08/09/19  Yes Camnitz, Will Daphine Deutscher, MD  pregabalin (LYRICA) 100 MG capsule Take 100 mg by mouth 3 (three) times daily.  07/29/19  Yes [provider]  REXULTI 3 MG TABS Take 1 tablet by mouth at bedtime. 04/22/20  Yes [provider]  rizatriptan (MAXALT) 10 MG tablet Take 10 mg by mouth every 2 (two) hours as needed for migraine. May repeat in 2 hours if needed    Yes  [provider]  Tiotropium Bromide-Olodaterol (STIOLTO RESPIMAT) 2.5-2.5 MCG/ACT AERS Inhale 2 puffs into the lungs daily. Insert 2 puffs in the lungs daily 07/22/19  Yes [provider]  tiZANidine (ZANAFLEX) 4 MG tablet Take 4 mg by mouth every 6 (six) hours as needed for muscle spasms.    Yes [provider]  topiramate (TOPAMAX) 100 MG tablet Take 100 mg by mouth daily.   Yes [provider]  torsemide (  DEMADEX) 20 MG tablet Take 2 tablets (40 mg total) by mouth daily. Patient taking differently: Take 40 mg by mouth daily as needed (swelling).  08/31/19  Yes Sherran Needs, NP  traMADol (ULTRAM) 50 MG tablet TAKE 1 TABLET EVERY 12 HOURS AS NEEDED Patient taking differently: Take 50 mg by mouth every 12 (twelve) hours as needed for moderate pain.  11/16/19  Yes Brunetta Jeans, PA-C  Vitamin D, Ergocalciferol, (DRISDOL) 1.25 MG (50000 UT) CAPS capsule Take 50,000 Units by mouth every 7 (seven) days. Take on Wednesday 07/29/19  Yes [provider]  albuterol (VENTOLIN HFA) 108 (90 Base) MCG/ACT inhaler Inhale 2 puffs into the lungs as needed. 2 puffs every 6 hours prn 07/22/19 08/27/19  [provider]  KLOR-CON M20 20 MEQ tablet Take 3 tablets (60 mEq total) by mouth daily. Patient not taking: Reported on 04/29/2020 08/31/19   Sherran Needs, NP  promethazine (PHENERGAN) 25 MG tablet as needed.  Patient not taking: Reported on 04/29/2020 08/02/19   [provider]  REXULTI 2 MG TABS tablet Take 2 mg by mouth daily. Patient not taking: Reported on 04/29/2020 04/19/20   [provider]    Allergies    Codeine  Review of Systems   Review of Systems  Constitutional: Negative for fever.  HENT: Negative for congestion.   Respiratory: Negative for shortness of breath.   Cardiovascular: Negative for chest pain.  Gastrointestinal: Negative for abdominal distention.  Neurological: Negative for dizziness and headaches.    Psychiatric/Behavioral: Positive for sleep disturbance and suicidal ideas.    Physical Exam Updated Vital Signs BP (!) 147/82   Pulse 69   Temp 98.8 F (37.1 C) (Oral)   Resp 18   Ht 5\' 7"  (1.702 m)   Wt (!) 150 kg   SpO2 100%   BMI 51.79 kg/m   Physical Exam Vitals and nursing note reviewed.  Constitutional:      General: She is not in acute distress.    Appearance: She is obese.  HENT:     Head: Normocephalic and atraumatic.     Nose: Nose normal.  Eyes:     General: No scleral icterus. Cardiovascular:     Rate and Rhythm: Normal rate and regular rhythm.     Pulses: Normal pulses.     Heart sounds: Normal heart sounds.  Pulmonary:     Effort: Pulmonary effort is normal. No respiratory distress.     Breath sounds: No wheezing.  Abdominal:     Palpations: Abdomen is soft.     Tenderness: There is no abdominal tenderness.  Musculoskeletal:     Cervical back: Normal range of motion.     Right lower leg: No edema.     Left lower leg: No edema.  Skin:    General: Skin is warm and dry.     Capillary Refill: Capillary refill takes less than 2 seconds.  Neurological:     Mental Status: She is alert. Mental status is at baseline.  Psychiatric:     Comments: Tearful when talking about her daughter.  Shows good judgment and insight.  Normal thought content.  Flat affect and dysphoric mood.     ED Results / Procedures / Treatments   Labs (all labs ordered are listed, but only abnormal results are displayed) Labs Reviewed  COMPREHENSIVE METABOLIC PANEL - Abnormal; Notable for the following components:      Result Value   Glucose, Bld 105 (*)    AST 69 (*)  ALT 55 (*)    All other components within normal limits  SALICYLATE LEVEL - Abnormal; Notable for the following components:   Salicylate Lvl <7.0 (*)    All other components within normal limits  ACETAMINOPHEN LEVEL - Abnormal; Notable for the following components:   Acetaminophen (Tylenol), Serum <10 (*)     All other components within normal limits  CBC - Abnormal; Notable for the following components:   Hemoglobin 11.9 (*)    All other components within normal limits  SARS CORONAVIRUS 2 BY RT PCR (HOSPITAL ORDER, PERFORMED IN [facility])  ETHANOL  RAPID URINE DRUG SCREEN, HOSP PERFORMED  POC URINE PREG, ED    EKG None  Radiology No results found.  Procedures Procedures (including critical care time)  Medications Ordered in ED Medications  apixaban (ELIQUIS) tablet 5 mg (has no administration in time range)  diltiazem (CARDIZEM) tablet 30 mg (has no administration in time range)  dofetilide (TIKOSYN) capsule 500 mcg (has no administration in time range)  DULoxetine (CYMBALTA) DR capsule 40 mg (has no administration in time range)  metoprolol tartrate (LOPRESSOR) tablet 50 mg (has no administration in time range)  pantoprazole (PROTONIX) EC tablet 40 mg (has no administration in time range)  pregabalin (LYRICA) capsule 50 mg (has no administration in time range)  brexpiprazole (REXULTI) tablet 1 mg (has no administration in time range)  tiZANidine (ZANAFLEX) tablet 4 mg (has no administration in time range)  topiramate (TOPAMAX) tablet 100 mg (has no administration in time range)  torsemide (DEMADEX) tablet 40 mg (has no administration in time range)  traMADol (ULTRAM) tablet 50 mg (has no administration in time range)    ED Course  I have reviewed the triage vital signs and the nursing notes.  Pertinent labs & imaging results that were available during my care of the patient were reviewed by me and considered in my medical decision making (see chart for details).  Patient here for suicidal ideation with plan.  She denies any attempts prior to arrival in ED today.  Denies any pain other than her back and her feet which is chronic.  Clinical Course as of Apr 29 2056  Sat Apr 29, 2020  1948 Tylenol, ethanol, salicylate levels normal/undetectable.CBC without  leukocytosis or notable anemia.CMP with no significant electrolyte abnormalities very mild transaminitis but no abdominal tenderness to correlate with this.  Rapid urine drug screen negative for any recreational drug use.  POC.  Suggest is negative.  Covid swab pending at this time.   [WF]  1949 Vital signs within normal limit patient is heart rate is 60-80   [WF]  1949 Patient medically clear this time.  She has been doing TTS consultation.  Her home labs have been reordered as she is comfortable sitting in bed.  She is here voluntarily and does not require IVC at this time.  I do believe she is a significant threat to herself however.    [WF]    Clinical Course User Index [WF] Gailen Shelter, [place]   Patient is medically cleared at this time given her past history of suicide attempts in her intention to overdose on Tikosyn I suspect that she will need psychiatric hospitalization and observation.  I had a conversation with the patient and she states she has no intention of leaving.  I informed her that she would need to be involuntarily committed if she attempts to leave she states that she does not intend to leave.  MDM Rules/Calculators/A&P  Patient's medications ordered.  She is awaiting TTS consultation she is placed on psychiatric hold.  Patient has friend at bedside.  They are both updated on her medical clearance and are awaiting TTS.  Final Clinical Impression(s) / ED Diagnoses Final diagnoses:  Suicidal ideation    Rx / DC Orders ED Discharge Orders    None       Gailen ShelterFondaw, Rada Zegers S, [place] 04/29/20 2057    Milagros Lollykstra, Richard S, MD 05/01/20 [phone]

## 2020-04-29 NOTE — ED Notes (Signed)
Patient resting quietly. Calm, cooperative, no complaints or requests at this time.
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
## 2020-04-29 NOTE — ED Notes (Signed)
Pt belongings include; jeans, shirt, sandals, purse with eye glass case, watch, phone, wallet, additional bag with clothing, ipad, and bible. Two medication bottles with tramadol, ibuprofen, and tykos.

## 2020-04-29 NOTE — ED Triage Notes (Signed)
The patient states, "I want to dies, I want ot commit suicide." Patient talked to her psychiatrist and was told to come here. The patient is grieving over the loss of her oldest daughter.

## 2020-04-30 ENCOUNTER — Encounter (HOSPITAL_COMMUNITY): Payer: Self-pay | Admitting: Nurse Practitioner

## 2020-04-30 ENCOUNTER — Inpatient Hospital Stay (HOSPITAL_COMMUNITY)
Admit: 2020-04-30 | Discharge: 2020-05-02 | DRG: 885 | Disposition: A | Discharge status: Home / Self Care | Payer: BC Managed Care – PPO | Source: Intra-hospital | Attending: Psychiatry | Admitting: Psychiatry

## 2020-04-30 DIAGNOSIS — F332 Major depressive disorder, recurrent severe without psychotic features: Principal | ICD-10-CM | POA: Diagnosis present

## 2020-04-30 DIAGNOSIS — Z20822 Contact with and (suspected) exposure to covid-19: Secondary | ICD-10-CM | POA: Diagnosis present

## 2020-04-30 DIAGNOSIS — Z8249 Family history of ischemic heart disease and other diseases of the circulatory system: Secondary | ICD-10-CM | POA: Diagnosis not present

## 2020-04-30 DIAGNOSIS — Z87891 Personal history of nicotine dependence: Secondary | ICD-10-CM

## 2020-04-30 DIAGNOSIS — I4819 Other persistent atrial fibrillation: Secondary | ICD-10-CM | POA: Diagnosis present

## 2020-04-30 DIAGNOSIS — K760 Fatty (change of) liver, not elsewhere classified: Secondary | ICD-10-CM | POA: Diagnosis present

## 2020-04-30 DIAGNOSIS — Z7901 Long term (current) use of anticoagulants: Secondary | ICD-10-CM | POA: Diagnosis not present

## 2020-04-30 DIAGNOSIS — Z79899 Other long term (current) drug therapy: Secondary | ICD-10-CM | POA: Diagnosis not present

## 2020-04-30 DIAGNOSIS — Z86718 Personal history of other venous thrombosis and embolism: Secondary | ICD-10-CM | POA: Diagnosis not present

## 2020-04-30 DIAGNOSIS — Z811 Family history of alcohol abuse and dependence: Secondary | ICD-10-CM

## 2020-04-30 DIAGNOSIS — Z801 Family history of malignant neoplasm of trachea, bronchus and lung: Secondary | ICD-10-CM | POA: Diagnosis not present

## 2020-04-30 DIAGNOSIS — Z86711 Personal history of pulmonary embolism: Secondary | ICD-10-CM

## 2020-04-30 DIAGNOSIS — R45851 Suicidal ideations: Secondary | ICD-10-CM | POA: Diagnosis present

## 2020-04-30 DIAGNOSIS — G4733 Obstructive sleep apnea (adult) (pediatric): Secondary | ICD-10-CM | POA: Diagnosis present

## 2020-04-30 DIAGNOSIS — Z885 Allergy status to narcotic agent status: Secondary | ICD-10-CM | POA: Diagnosis not present

## 2020-04-30 DIAGNOSIS — M797 Fibromyalgia: Secondary | ICD-10-CM | POA: Diagnosis present

## 2020-04-30 DIAGNOSIS — E669 Obesity, unspecified: Secondary | ICD-10-CM | POA: Diagnosis present

## 2020-04-30 DIAGNOSIS — Z6841 Body Mass Index (BMI) 40.0 and over, adult: Secondary | ICD-10-CM

## 2020-04-30 DIAGNOSIS — E7800 Pure hypercholesterolemia, unspecified: Secondary | ICD-10-CM | POA: Diagnosis present

## 2020-04-30 DIAGNOSIS — I739 Peripheral vascular disease, unspecified: Secondary | ICD-10-CM | POA: Diagnosis present

## 2020-04-30 DIAGNOSIS — Z841 Family history of disorders of kidney and ureter: Secondary | ICD-10-CM

## 2020-04-30 DIAGNOSIS — G43909 Migraine, unspecified, not intractable, without status migrainosus: Secondary | ICD-10-CM | POA: Diagnosis present

## 2020-04-30 DIAGNOSIS — I10 Essential (primary) hypertension: Secondary | ICD-10-CM | POA: Diagnosis present

## 2020-04-30 DIAGNOSIS — K219 Gastro-esophageal reflux disease without esophagitis: Secondary | ICD-10-CM | POA: Diagnosis present

## 2020-04-30 LAB — BASIC METABOLIC PANEL
Anion gap: 13 (ref 5–15)
BUN: 15 mg/dL (ref 6–20)
CO2: 26 mmol/L (ref 22–32)
Calcium: 9 mg/dL (ref 8.9–10.3)
Chloride: 103 mmol/L (ref 98–111)
Creatinine, Ser: 0.91 mg/dL (ref 0.44–1.00)
GFR calc Af Amer: >60 mL/min (ref >60)
GFR calc non Af Amer: >60 mL/min (ref >60)
Glucose, Bld: 124 mg/dL — ABNORMAL HIGH (ref 70–99)
Potassium: 3.9 mmol/L (ref 3.5–5.1)
Sodium: 142 mmol/L (ref 135–145)

## 2020-04-30 LAB — MAGNESIUM: Magnesium: 2.2 mg/dL (ref 1.7–2.4)

## 2020-04-30 LAB — LIPID PANEL
Cholesterol: 175 mg/dL (ref 0–200)
HDL: 40 mg/dL — ABNORMAL LOW (ref >40)
LDL Cholesterol: 112 mg/dL — ABNORMAL HIGH (ref 0–99)
Total CHOL/HDL Ratio: 4.4 RATIO
Triglycerides: 114 mg/dL (ref <150)
VLDL: 23 mg/dL (ref 0–40)

## 2020-04-30 LAB — HEMOGLOBIN A1C
Hgb A1c MFr Bld: 5.4 % (ref 4.8–5.6)
Mean Plasma Glucose: 108.28 mg/dL

## 2020-04-30 LAB — TSH: TSH: 1.015 u[IU]/mL (ref 0.350–4.500)

## 2020-04-30 MED ORDER — TRAZODONE HCL 50 MG PO TABS
50.0000 mg | ORAL_TABLET | Freq: Every evening | ORAL | Status: DC | PRN
  Administered 2020-04-30: 50 mg via ORAL
  Filled 2020-04-30: qty 1

## 2020-04-30 MED ORDER — HYDROXYZINE HCL 25 MG PO TABS
25.0000 mg | ORAL_TABLET | Freq: Three times a day (TID) | ORAL | Status: DC | PRN
  Administered 2020-05-01 – 2020-05-02 (×2): 25 mg via ORAL
  Filled 2020-04-30 (×2): qty 1

## 2020-04-30 MED ORDER — PREGABALIN 100 MG PO CAPS
100.0000 mg | ORAL_CAPSULE | Freq: Two times a day (BID) | ORAL | Status: DC
  Administered 2020-04-30 – 2020-05-02 (×5): 100 mg via ORAL
  Filled 2020-04-30 (×5): qty 1

## 2020-04-30 MED ORDER — ACETAMINOPHEN 325 MG PO TABS: 650.0000 mg | ORAL_TABLET | Freq: Four times a day (QID) | ORAL | Status: DC | PRN

## 2020-04-30 MED ORDER — ALUM & MAG HYDROXIDE-SIMETH 200-200-20 MG/5ML PO SUSP: 30.0000 mL | ORAL | Status: DC | PRN

## 2020-04-30 MED ORDER — DILTIAZEM HCL 30 MG PO TABS
30.0000 mg | ORAL_TABLET | ORAL | Status: DC | PRN
  Filled 2020-04-30: qty 1

## 2020-04-30 MED ORDER — APIXABAN 5 MG PO TABS
5.0000 mg | ORAL_TABLET | Freq: Two times a day (BID) | ORAL | Status: DC
  Administered 2020-04-30 – 2020-05-02 (×5): 5 mg via ORAL
  Filled 2020-04-30 (×9): qty 1

## 2020-04-30 MED ORDER — MAGNESIUM HYDROXIDE 400 MG/5ML PO SUSP: 30.0000 mL | Freq: Every day | ORAL | Status: DC | PRN

## 2020-04-30 MED ORDER — ALBUTEROL SULFATE HFA 108 (90 BASE) MCG/ACT IN AERS: 1.0000 | INHALATION_SPRAY | Freq: Four times a day (QID) | RESPIRATORY_TRACT | Status: DC | PRN

## 2020-04-30 MED ORDER — DOFETILIDE 500 MCG PO CAPS
500.0000 ug | ORAL_CAPSULE | Freq: Two times a day (BID) | ORAL | Status: DC
  Administered 2020-04-30 – 2020-05-02 (×5): 500 ug via ORAL
  Filled 2020-04-30 (×9): qty 1

## 2020-04-30 MED ORDER — SUMATRIPTAN SUCCINATE 50 MG PO TABS: 50.0000 mg | ORAL_TABLET | ORAL | Status: DC | PRN

## 2020-04-30 MED ORDER — BREXPIPRAZOLE 2 MG PO TABS
3.0000 mg | ORAL_TABLET | Freq: Every day | ORAL | Status: DC
  Administered 2020-04-30 – 2020-05-01 (×2): 3 mg via ORAL
  Filled 2020-04-30 (×4): qty 1

## 2020-04-30 MED ORDER — DULOXETINE HCL 60 MG PO CPEP
60.0000 mg | ORAL_CAPSULE | Freq: Every day | ORAL | Status: DC
  Administered 2020-04-30 – 2020-05-02 (×3): 60 mg via ORAL
  Filled 2020-04-30 (×5): qty 1

## 2020-04-30 MED ORDER — PANTOPRAZOLE SODIUM 40 MG PO TBEC
40.0000 mg | DELAYED_RELEASE_TABLET | Freq: Every day | ORAL | Status: DC
  Administered 2020-04-30 – 2020-05-02 (×3): 40 mg via ORAL
  Filled 2020-04-30 (×6): qty 1

## 2020-04-30 NOTE — Progress Notes (Signed)
Writer discussed with the pt having a family member or friend bring in her CPAP. The pt agreed to have someone bring in her CPAP today.

## 2020-04-30 NOTE — Progress Notes (Signed)
   04/30/20 1100  Psych Admission Type (Psych Patients Only)  Admission Status Voluntary  Psychosocial Assessment  Patient Complaints Depression  Eye Contact Fair  Facial Expression Flat  Affect Depressed  Speech Soft;Slow  Interaction Assertive  Motor Activity Slow  Appearance/Hygiene Unremarkable  Behavior Characteristics Appropriate to situation;Cooperative  Mood Depressed  Aggressive Behavior  Effect No apparent injury  Thought Process  Coherency WDL  Content WDL  Delusions None reported or observed  Perception WDL  Hallucination None reported or observed  Judgment WDL  Confusion None  Danger to Self  Current suicidal ideation? Denies  Danger to Others  Danger to Others None reported or observed

## 2020-04-30 NOTE — ED Notes (Signed)
Safe Transport contacted to transfer patient across to [facility]

## 2020-04-30 NOTE — Progress Notes (Signed)
Adult Psychoeducational Group Note  Date:  04/30/2020 Time:  11:05 PM  Group Topic/Focus:  Wrap-Up Group:   The focus of this group is to help patients review their daily goal of treatment and discuss progress on daily workbooks.  Participation Level:  Did Not Attend  Participation Quality:  Did not attend  Affect:  Did not attend  Cognitive:  Did not attend  Insight: None  Engagement in Group:  Did not attend  Modes of Intervention:  Did not attend  Additional Comments:  Patient did not attend  Judithann Sheen 04/30/2020, 11:05 PM
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
## 2020-04-30 NOTE — Progress Notes (Signed)
'[ '[' -  Patient ID: Regina Morales, female   DOB: 1961-08-04, 59 y.o.   MRN: [id number] Patient is a 59 year old Caucasian female admitted under voluntary status from [facility]. Patient is orientedx4, mood is depressed and affect is blunted and congruent with mood.  She is tearful as she talks about her 90 year old daughter who died in 2023-03-06. Patient verbalizes passive suicidal ideations, stating: "I want to be with her, I just need to know that she is okay" . Patient recounted that daughter had a cardiac arrest that was sudden, and died. She states that her depressive symptoms have been worsening after that.  She states that she is being admitted because she had suicidal thoughts to kill self so she can be with her daughter, but has no plan.  Pt endorses poor sleep, poor appetite, anhedonia, and a decreased mood since daughter died. Patient denies any substance abuse/alcohol/recreational drug use, states that she is taking her medications as prescribed. Patient reports her only medical condition as being COPD, and only mental health condition as being MDD. Patient states that she is living in a house with a room mate. She has been educated on all unit rules and protocols, has verbally contracted for safety on the unit, Q15 minute checks initiated. Patient denies AVH/HI.Marland Kitchen

## 2020-04-30 NOTE — [facility] Group Notes (Signed)
[facility] LCSW Group Therapy Note  Date/Time:  04/30/2020 9:00-10:00 or 10:00-11:00AM  Type of Therapy and Topic:  Group Therapy:  Healthy and Unhealthy Supports  Participation Level:  Active   Description of Group:  Patients in this group were introduced to the idea of adding a variety of healthy supports to address the various needs in their lives.Patients discussed what additional healthy supports could be helpful in their recovery and wellness after discharge in order to prevent future hospitalizations.   An emphasis was placed on using counselor, doctor, therapy groups, 12-step groups, and problem-specific support groups to expand supports.  Several songs were played to emphasize points made throughout group.  Therapeutic Goals:   1)  discuss importance of adding supports to stay well once out of the hospital  2)  compare healthy versus unhealthy supports and identify some examples of each  3)  generate ideas and descriptions of healthy supports that can be added  4)  offer mutual support about how to address unhealthy supports  5)  encourage active participation in and adherence to discharge plan    Summary of Patient Progress:  The patient expressed a willingness to add a grief support group with other people who understood her feelings as support(s) to help in her recovery journey.   Therapeutic Modalities:   Motivational Interviewing Brief Solution-Focused Therapy  Ambrose Mantle, LCSW

## 2020-04-30 NOTE — Progress Notes (Signed)
   04/30/20 2117  COVID-19 Daily Checkoff  Have you had a fever (temp > 37.80C/100F)  in the past 24 hours?  No  If you have had runny nose, nasal congestion, sneezing in the past 24 hours, has it worsened? No  COVID-19 EXPOSURE  Have you traveled outside the state in the past 14 days? No  Have you been in contact with someone with a confirmed diagnosis of COVID-19 or PUI in the past 14 days without wearing appropriate PPE? No  Have you been living in the same home as a person with confirmed diagnosis of COVID-19 or a PUI (household contact)? No  Have you been diagnosed with COVID-19? No

## 2020-04-30 NOTE — Tx Team (Signed)
Initial Treatment Plan 04/30/2020 3:43 AM Regina Morales IVH:[id number]    PATIENT STRESSORS: Traumatic event   PATIENT STRENGTHS: Ability for insight Average or above average intelligence Communication skills   PATIENT IDENTIFIED PROBLEMS: Grief-Loss of Daughter  Suicidal Ideation                   DISCHARGE CRITERIA:  Ability to meet basic life and health needs Improved stabilization in mood, thinking, and/or behavior Reduction of life-threatening or endangering symptoms to within safe limits Safe-care adequate arrangements made Verbal commitment to aftercare and medication compliance  PRELIMINARY DISCHARGE PLAN: Outpatient therapy Participate in family therapy  PATIENT/FAMILY INVOLVEMENT: This treatment plan has been presented to and reviewed with the patient, Regina Morales, and/or family member.  The patient Regina Morales been given the opportunity to ask questions and make suggestions.  Regina Blue, RN 04/30/2020, 3:43 AM

## 2020-04-30 NOTE — Progress Notes (Signed)
Dr. Jola Babinski notified about the pt's EKG.   Results:  QT/QTC 424/515 Vent. rate 89 BPM Normal sinus rhythm, Left axis deviation, Inferior farct, age undetermined, Prolonged QT, Abnormal ECG

## 2020-04-30 NOTE — ED Provider Notes (Signed)
1:02 AM  Spoke with Ala Dach with [facility].  Patient accepted to their facility tonight.  I have reviewed labs.  No acute abnormality noted.  Vitals unremarkable as well.    Natarsha Hurwitz, Layla Maw, DO 04/30/20 0003

## 2020-04-30 NOTE — Progress Notes (Signed)
°   04/30/20 2139  Psych Admission Type (Psych Patients Only)  Admission Status Voluntary  Psychosocial Assessment  Patient Complaints Depression  Eye Contact Fair  Facial Expression Flat  Affect Appropriate to circumstance  Speech Soft  Interaction Assertive  Motor Activity Slow  Appearance/Hygiene Unremarkable  Behavior Characteristics Appropriate to situation  Mood Depressed  Thought Process  Coherency WDL  Content WDL  Delusions None reported or observed  Perception WDL  Hallucination None reported or observed  Judgment WDL  Confusion None  Danger to Self  Current suicidal ideation? Denies  Danger to Others  Danger to Others None reported or observed
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
## 2020-05-01 LAB — BASIC METABOLIC PANEL
Anion gap: 13 (ref 5–15)
BUN: 16 mg/dL (ref 6–20)
CO2: 25 mmol/L (ref 22–32)
Calcium: 8.8 mg/dL — ABNORMAL LOW (ref 8.9–10.3)
Chloride: 101 mmol/L (ref 98–111)
Creatinine, Ser: 0.87 mg/dL (ref 0.44–1.00)
GFR calc Af Amer: >60 mL/min (ref >60)
GFR calc non Af Amer: >60 mL/min (ref >60)
Glucose, Bld: 107 mg/dL — ABNORMAL HIGH (ref 70–99)
Potassium: 3.5 mmol/L (ref 3.5–5.1)
Sodium: 139 mmol/L (ref 135–145)

## 2020-05-01 LAB — MAGNESIUM: Magnesium: 2.2 mg/dL (ref 1.7–2.4)

## 2020-05-01 NOTE — Progress Notes (Signed)
Recreation Therapy Notes  Date: 6.28.21 Time: 0930 Location: 300 [address] Group Room  Group Topic: Stress Management  Goal Area(s) Addresses:  Patient will identify positive stress management techniques. Patient will identify benefits of using stress management post d/c.  Intervention: Stress Management  Activity : Guided Imagery.  LRT read a script that took patients on a journey to the beach to enjoy the peaceful waves.  Patients were to listen and follow along as script was read.  Education:  Stress Management, Discharge Planning.   Education Outcome: Acknowledges Education  Clinical Observations/Feedback: Pt did not attend group session.     Caroll Rancher, LRT/CTRS         Caroll Rancher A 05/01/2020 11:37 AM

## 2020-05-01 NOTE — Progress Notes (Signed)
D: Patient denied SI and HI, contracts for safety.  Denied A/V hallucinations.  Denied pain. A:  Medications administered per MD orders.  Emotional support and encouragement given patient. R:  Safety maintained with 15 minute checks.  PATIENT STATED SHE WANTS TO DISCHARGE TOMORROW.

## 2020-05-01 NOTE — Progress Notes (Signed)
EKG completed this morning.

## 2020-05-01 NOTE — [facility] Group Notes (Signed)
LCSW Group Therapy Note 05/01/2020 2:23 PM  Type of Therapy and Topic: Group Therapy: Overcoming Obstacles  Participation Level: Did Not Attend  Description of Group:  In this group patients will be encouraged to explore what they see as obstacles to their own wellness and recovery. They will be guided to discuss their thoughts, feelings, and behaviors related to these obstacles. The group will process together ways to cope with barriers, with attention given to specific choices patients can make. Each patient will be challenged to identify changes they are motivated to make in order to overcome their obstacles. This group will be process-oriented, with patients participating in exploration of their own experiences as well as giving and receiving support and challenge from other group members.  Therapeutic Goals: 1. Patient will identify personal and current obstacles as they relate to admission. 2. Patient will identify barriers that currently interfere with their wellness or overcoming obstacles.  3. Patient will identify feelings, thought process and behaviors related to these barriers. 4. Patient will identify two changes they are willing to make to overcome these obstacles:   Summary of Patient Progress  Invited, chose not to attend.    Therapeutic Modalities:  Cognitive Behavioral Therapy Solution Focused Therapy Motivational Interviewing Relapse Prevention Therapy   Alcario Drought Clinical Social Worker
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
## 2020-05-01 NOTE — Tx Team (Signed)
Interdisciplinary Treatment and Diagnostic Plan Update  05/01/2020 Time of Session: 9:00am Regina Morales MRN: [id number]  Principal Diagnosis: <principal problem not specified>  Secondary Diagnoses: Active Problems:   Severe recurrent major depression without psychotic features ([place])   Current Medications:  Current Facility-Administered Medications  Medication Dose Route Frequency Provider Last Rate Last Admin  . acetaminophen (TYLENOL) tablet 650 mg  650 mg Oral Q6H PRN Lindon Romp A, NP      . albuterol (VENTOLIN HFA) 108 (90 Base) MCG/ACT inhaler 1-2 puff  1-2 puff Inhalation Q6H PRN Sharma Covert, MD      . alum & mag hydroxide-simeth (MAALOX/MYLANTA) 200-200-20 MG/5ML suspension 30 mL  30 mL Oral Q4H PRN Lindon Romp A, NP      . apixaban (ELIQUIS) tablet 5 mg  5 mg Oral BID Sharma Covert, MD   5 mg at 05/01/20 0854  . brexpiprazole (REXULTI) tablet 3 mg  3 mg Oral QHS Sharma Covert, MD   3 mg at 04/30/20 2105  . diltiazem (CARDIZEM) tablet 30 mg  30 mg Oral Q4H PRN Sharma Covert, MD      . dofetilide [facility]) capsule 500 mcg  500 mcg Oral BID Sharma Covert, MD   500 mcg at 05/01/20 [phone]  . DULoxetine (CYMBALTA) DR capsule 60 mg  60 mg Oral Daily Sharma Covert, MD   60 mg at 05/01/20 0854  . hydrOXYzine (ATARAX/VISTARIL) tablet 25 mg  25 mg Oral TID PRN Lindon Romp A, NP      . magnesium hydroxide (MILK OF MAGNESIA) suspension 30 mL  30 mL Oral Daily PRN Lindon Romp A, NP      . pantoprazole (PROTONIX) EC tablet 40 mg  40 mg Oral Daily Sharma Covert, MD   40 mg at 05/01/20 0852  . pregabalin (LYRICA) capsule 100 mg  100 mg Oral BID Sharma Covert, MD   100 mg at 05/01/20 0853  . SUMAtriptan (IMITREX) tablet 50 mg  50 mg Oral Q2H PRN Sharma Covert, MD      . traZODone (DESYREL) tablet 50 mg  50 mg Oral QHS PRN Rozetta Nunnery, NP   50 mg at 04/30/20 2106   PTA Medications: Medications Prior to Admission  Medication Sig Dispense  Refill Last Dose  . albuterol (VENTOLIN HFA) 108 (90 Base) MCG/ACT inhaler Inhale 2 puffs into the lungs as needed. 2 puffs every 6 hours prn     . albuterol (VENTOLIN HFA) 108 (90 Base) MCG/ACT inhaler Inhale 2 puffs into the lungs every 6 (six) hours as needed for wheezing or shortness of breath.     Marland Kitchen apixaban (ELIQUIS) 5 MG TABS tablet TAKE 1 TABLET(5 MG) BY MOUTH TWICE DAILY 180 tablet 2   . diltiazem (CARDIZEM) 30 MG tablet Take 1 tablet every 4 hours AS NEEDED for AFIB heart rate >100 45 tablet 3   . dofetilide (TIKOSYN) 500 MCG capsule TAKE 1 CAPSULE TWICE A DAY 180 capsule 2   . DULoxetine (CYMBALTA) 20 MG capsule Take 20 mg by mouth daily.     Marland Kitchen KLOR-CON M20 20 MEQ tablet Take 3 tablets (60 mEq total) by mouth daily. (Patient not taking: Reported on 04/29/2020) 270 tablet 2   . metoprolol tartrate (LOPRESSOR) 50 MG tablet Take 1 tablet (50 mg total) by mouth 2 (two) times daily. 180 tablet 3   . pregabalin (LYRICA) 100 MG capsule Take 100 mg by mouth 3 (three) times daily.      Marland Kitchen  promethazine (PHENERGAN) 25 MG tablet as needed.  (Patient not taking: Reported on 04/29/2020)     . REXULTI 2 MG TABS tablet Take 2 mg by mouth daily. (Patient not taking: Reported on 04/29/2020)     . REXULTI 3 MG TABS Take 1 tablet by mouth at bedtime.     . rizatriptan (MAXALT) 10 MG tablet Take 10 mg by mouth every 2 (two) hours as needed for migraine. May repeat in 2 hours if needed      . Tiotropium Bromide-Olodaterol (STIOLTO RESPIMAT) 2.5-2.5 MCG/ACT AERS Inhale 2 puffs into the lungs daily. Insert 2 puffs in the lungs daily     . tiZANidine (ZANAFLEX) 4 MG tablet Take 4 mg by mouth every 6 (six) hours as needed for muscle spasms.      Marland Kitchen topiramate (TOPAMAX) 100 MG tablet Take 100 mg by mouth daily.     Marland Kitchen torsemide (DEMADEX) 20 MG tablet Take 2 tablets (40 mg total) by mouth daily. (Patient taking differently: Take 40 mg by mouth daily as needed (swelling). ) 180 tablet 2   . traMADol (ULTRAM) 50 MG tablet  TAKE 1 TABLET EVERY 12 HOURS AS NEEDED (Patient taking differently: Take 50 mg by mouth every 12 (twelve) hours as needed for moderate pain. ) 60 tablet 0   . Vitamin D, Ergocalciferol, (DRISDOL) 1.25 MG (50000 UT) CAPS capsule Take 50,000 Units by mouth every 7 (seven) days. Take on Wednesday       Patient Stressors: Traumatic event  Patient Strengths: Ability for insight Average or above average intelligence Communication skills  Treatment Modalities: Medication Management, Group therapy, Case management,  1 to 1 session with clinician, Psychoeducation, Recreational therapy.   Physician Treatment Plan for Primary Diagnosis: <principal problem not specified> Long Term Goal(s): Improvement in symptoms so as ready for discharge Improvement in symptoms so as ready for discharge   Short Term Goals: Ability to identify changes in lifestyle to reduce recurrence of condition will improve Ability to verbalize feelings will improve Ability to disclose and discuss suicidal ideas Ability to demonstrate self-control will improve Ability to identify and develop effective coping behaviors will improve Ability to maintain clinical measurements within normal limits will improve Ability to identify changes in lifestyle to reduce recurrence of condition will improve Ability to verbalize feelings will improve Ability to disclose and discuss suicidal ideas Ability to demonstrate self-control will improve Ability to identify and develop effective coping behaviors will improve Ability to maintain clinical measurements within normal limits will improve  Medication Management: Evaluate patient's response, side effects, and tolerance of medication regimen.  Therapeutic Interventions: 1 to 1 sessions, Unit Group sessions and Medication administration.  Evaluation of Outcomes: Progressing  Physician Treatment Plan for Secondary Diagnosis: Active Problems:   Severe recurrent major depression without  psychotic features (HCC)  Long Term Goal(s): Improvement in symptoms so as ready for discharge Improvement in symptoms so as ready for discharge   Short Term Goals: Ability to identify changes in lifestyle to reduce recurrence of condition will improve Ability to verbalize feelings will improve Ability to disclose and discuss suicidal ideas Ability to demonstrate self-control will improve Ability to identify and develop effective coping behaviors will improve Ability to maintain clinical measurements within normal limits will improve Ability to identify changes in lifestyle to reduce recurrence of condition will improve Ability to verbalize feelings will improve Ability to disclose and discuss suicidal ideas Ability to demonstrate self-control will improve Ability to identify and develop effective coping behaviors will improve  Ability to maintain clinical measurements within normal limits will improve     Medication Management: Evaluate patient's response, side effects, and tolerance of medication regimen.  Therapeutic Interventions: 1 to 1 sessions, Unit Group sessions and Medication administration.  Evaluation of Outcomes: Progressing   RN Treatment Plan for Primary Diagnosis: <principal problem not specified> Long Term Goal(s): Knowledge of disease and therapeutic regimen to maintain health will improve  Short Term Goals: Ability to verbalize feelings will improve, Ability to disclose and discuss suicidal ideas and Ability to identify and develop effective coping behaviors will improve  Medication Management: RN will administer medications as ordered by provider, will assess and evaluate patient's response and provide education to patient for prescribed medication. RN will report any adverse and/or side effects to prescribing provider.  Therapeutic Interventions: 1 on 1 counseling sessions, Psychoeducation, Medication administration, Evaluate responses to treatment, Monitor vital  signs and CBGs as ordered, Perform/monitor CIWA, COWS, AIMS and Fall Risk screenings as ordered, Perform wound care treatments as ordered.  Evaluation of Outcomes: Progressing   LCSW Treatment Plan for Primary Diagnosis: <principal problem not specified> Long Term Goal(s): Safe transition to appropriate next level of care at discharge, Engage patient in therapeutic group addressing interpersonal concerns.  Short Term Goals: Engage patient in aftercare planning with referrals and resources, Increase social support, Increase emotional regulation, Identify triggers associated with mental health/substance abuse issues and Increase skills for wellness and recovery  Therapeutic Interventions: Assess for all discharge needs, 1 to 1 time with Social worker, Explore available resources and support systems, Assess for adequacy in community support network, Educate family and significant other(s) on suicide prevention, Complete Psychosocial Assessment, Interpersonal group therapy.  Evaluation of Outcomes: Progressing  Progress in Treatment: Attending groups: Yes. Participating in groups: Yes. Taking medication as prescribed: Yes. Toleration medication: Yes. Family/Significant other contact made: No, will contact:  supports if consents are granted. Patient understands diagnosis: Yes. Discussing patient identified problems/goals with staff: Yes. Medical problems stabilized or resolved: Yes. Denies suicidal/homicidal ideation: No. Issues/concerns per patient self-inventory: Yes.  New problem(s) identified: Yes, Describe:  grief and loss  New Short Term/Long Term Goal(s): medication management for mood stabilization; elimination of SI thoughts; development of comprehensive mental wellness/sobriety plan.  Patient Goals: "Improve depression and thoughts."  Discharge Plan or Barriers: Expected to return home and continue with current provider for medication management. Established with hospice for  grief counseling.  Reason for Continuation of Hospitalization: Anxiety Depression Medication stabilization Suicidal ideation  Estimated Length of Stay: 3-5 days  Attendees: Patient: Regina Morales 05/01/2020 10:02 AM  Physician: Dr. Jola Babinski 05/01/2020 10:02 AM  Nursing:  05/01/2020 10:02 AM  RN Care Manager: 05/01/2020 10:02 AM  Social Worker: Enid Cutter, LCSWA 05/01/2020 10:02 AM  Recreational Therapist:  05/01/2020 10:02 AM  Other:  05/01/2020 10:02 AM  Other:  05/01/2020 10:02 AM  Other: 05/01/2020 10:02 AM    Scribe for Treatment Team: Darreld Mclean, LCSWA 05/01/2020 10:02 AM

## 2020-05-02 LAB — BASIC METABOLIC PANEL
Anion gap: 12 (ref 5–15)
BUN: 16 mg/dL (ref 6–20)
CO2: 25 mmol/L (ref 22–32)
Calcium: 9.2 mg/dL (ref 8.9–10.3)
Chloride: 104 mmol/L (ref 98–111)
Creatinine, Ser: 0.72 mg/dL (ref 0.44–1.00)
GFR calc Af Amer: >60 mL/min (ref >60)
GFR calc non Af Amer: >60 mL/min (ref >60)
Glucose, Bld: 112 mg/dL — ABNORMAL HIGH (ref 70–99)
Potassium: 3.8 mmol/L (ref 3.5–5.1)
Sodium: 141 mmol/L (ref 135–145)

## 2020-05-02 LAB — MAGNESIUM: Magnesium: 2.3 mg/dL (ref 1.7–2.4)

## 2020-05-02 MED ORDER — METOPROLOL TARTRATE 50 MG PO TABS
50.0000 mg | ORAL_TABLET | Freq: Two times a day (BID) | ORAL | Status: DC
  Administered 2020-05-02: 50 mg via ORAL
  Filled 2020-05-02 (×4): qty 1

## 2020-05-02 MED ORDER — DULOXETINE HCL 60 MG PO CPEP: 60.0000 mg | ORAL_CAPSULE | Freq: Every day | ORAL | 0 refills | Status: DC

## 2020-05-02 NOTE — [facility] Suicide Risk Assessment (Signed)
[facility] INPATIENT:  Family/Significant Other Suicide Prevention Education  Suicide Prevention Education:  Patient Refusal for Family/Significant Other Suicide Prevention Education: The patient Regina Morales has refused to provide written consent for family/significant other to be provided Family/Significant Other Suicide Prevention Education during admission and/or prior to discharge.  Physician notified.  SPE completed with patient, as patient refused to consent to family contact. SPI pamphlet provided to pt and pt was encouraged to share information with support network, ask questions, and talk about any concerns relating to SPE. Patient denies access to guns/firearms and verbalized understanding of information provided. Mobile Crisis information also provided to patient.    Maeola Sarah 05/02/2020, 11:39 AM

## 2020-05-02 NOTE — [facility] Suicide Risk Assessment (Signed)
[facility] Discharge Suicide Risk Assessment   Principal Problem: <principal problem not specified> Discharge Diagnoses: Active Problems:   Severe recurrent major depression without psychotic features (HCC)   Total Time spent with patient: 20 minutes  Musculoskeletal: Strength & Muscle Tone: within normal limits Gait & Station: normal Patient leans: N/A  Psychiatric Specialty Exam: Review of Systems  All other systems reviewed and are negative.   Blood pressure (!) 143/89, pulse (!) 130, temperature 98.4 F (36.9 C), temperature source Oral, resp. rate 18, height 5\' 7"  (1.702 m), weight (!) 149.2 kg, SpO2 98 %.Body mass index is 51.53 kg/m.  General Appearance: Casual  Eye Contact::  Good  Speech:  Normal Rate409  Volume:  Normal  Mood:  Depressed  Affect:  Congruent  Thought Process:  Coherent and Descriptions of Associations: Intact  Orientation:  Full (Time, Place, and Person)  Thought Content:  Logical  Suicidal Thoughts:  No  Homicidal Thoughts:  No  Memory:  Immediate;   Good Recent;   Good Remote;   Good  Judgement:  Intact  Insight:  Good  Psychomotor Activity:  Normal  Concentration:  Good  Recall:  Good  Fund of Knowledge:Good  Language: Good  Akathisia:  Negative  Handed:  Right  AIMS (if indicated):     Assets:  Communication Skills Desire for Improvement Financial Resources/Insurance Housing Resilience Social Support Talents/Skills Transportation  Sleep:  Number of Hours: 5.5  Cognition: WNL  ADL's:  Intact   Mental Status Per Nursing Assessment::   On Admission:  Suicidal ideation indicated by patient  Demographic Factors:  Divorced or widowed and Caucasian  Loss Factors: Loss of significant relationship  Historical Factors: Impulsivity  Risk Reduction Factors:   Living with another person, especially a relative, Positive social support, Positive therapeutic relationship and Positive coping skills or problem solving skills  Continued  Clinical Symptoms:  Severe Anxiety and/or Agitation Depression:   Impulsivity  Cognitive Features That Contribute To Risk:  None    Suicide Risk:  Minimal: No identifiable suicidal ideation.  Patients presenting with no risk factors but with morbid ruminations; may be classified as minimal risk based on the severity of the depressive symptoms   Follow-up Information    [phone] on [facility]2020.   Why: You have an appointment for grief counseling therapy with 05/19/2020 on 05/17/20 at 9:00 am.  This will be a Virtual appointment. Contact information: [address] [facility] [place] [phone]              Plan Of Care/Follow-up recommendations:  Activity:  ad lib  [phone], MD 05/02/2020, 9:17 AM

## 2020-05-02 NOTE — Progress Notes (Signed)
   05/01/20 1940  Psych Admission Type (Psych Patients Only)  Admission Status Voluntary  Psychosocial Assessment  Patient Complaints Depression;Sadness  Eye Contact Fair  Facial Expression Sad;Flat  Affect Depressed;Sad;Appropriate to circumstance  Speech Soft  Interaction Assertive  Motor Activity Slow  Appearance/Hygiene Unremarkable  Behavior Characteristics Cooperative;Anxious  Mood Depressed;Sad;Despair  Thought Process  Coherency WDL  Content WDL  Delusions None reported or observed  Perception WDL  Hallucination None reported or observed  Judgment WDL  Confusion None  Danger to Self  Current suicidal ideation? Denies  Danger to Others  Danger to Others None reported or observed

## 2020-05-02 NOTE — Progress Notes (Signed)
Discharge Note:  Patient discharged home with family member.  Patient denied SI and HI.  Denied A/V hallucinations.  Suicide prevention information given and discussed with patient who stated she understood and had no questions.   Patient stated she appreciated all assistance from [facility] staff.  Patient stated she received all her belongings, clothing, toiletries, misc items, etc.  All required discharge information given to patient at discharge.

## 2020-05-02 NOTE — Progress Notes (Signed)
  [facility] Adult Case Management Discharge Plan :  Will you be returning to the same living situation after discharge:  Yes,  patient is returning home with her roommate At discharge, do you have transportation home?: Yes,  patient's friend is picking her up Do you have the ability to pay for your medications: Yes,  BCBS  Release of information consent forms completed and in the chart;  Patient's signature needed at discharge.  Patient to Follow up at:  Follow-up Information    [facility] [place]. Call on 05/17/2020.   Why: You have an appointment for grief counseling therapy with Cecil Cobbs on 05/17/20 at 9:00 am.  This will be a Virtual appointment. Please contact office for virtual access link and for any other questions or concerns.  Contact information: [address] [place] [phone]       [facility] Follow up.   Specialty: Behavioral Health Why: Please contact for any additional psychiatric services (medication management, therapy, IOP). Be sure to have your hospital discharge paperwork available.  Contact information: [address] [place] [address] [phone]       [facility]. Call on 05/03/2020.   Why: CSW was not able to obtain your psychiatrist's current location or contact information. Per your report, you have an appointment on Wednesday, 05/03/2020 at 9:30am. Be sure to have your discharge paperwork.               Next level of care provider has access to [facility] Link:yes  Safety Planning and Suicide Prevention discussed: Yes,  with the patient     Has patient been referred to the Quitline?: N/A patient is not a smoker  Patient has been referred for addiction treatment: N/A    Maeola Sarah, LCSWA 05/02/2020, 11:40 AM
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
## 2020-05-02 NOTE — Progress Notes (Signed)
D:  Patient's self inventory sheet, patient sleeps good, no sleep medication.  Good appetite, normal energy level, good concentration.  Rated depression, denied hopeless, rated anxiety 2.  Denied withdrawals.  Denied SI.  Denied physical problems.  Denied physical pain.  Goal is discharge home, keep thoughts positive.  Plans to read the book of Mormon, pray.  Feeling much better now. A:  Medications administered per MD orders.  Emotional support and encouragement given patient. R:  Denied SI and HI, contracts for safety.  Denied A/V hallucinations.  Safety maintained with 15 minute checks.

## 2020-05-02 NOTE — Progress Notes (Signed)
   05/01/20 1940  COVID-19 Daily Checkoff  Have you had a fever (temp > 37.80C/100F)  in the past 24 hours?  No  COVID-19 EXPOSURE  Have you traveled outside the state in the past 14 days? No  Have you been in contact with someone with a confirmed diagnosis of COVID-19 or PUI in the past 14 days without wearing appropriate PPE? No  Have you been living in the same home as a person with confirmed diagnosis of COVID-19 or a PUI (household contact)? No  Have you been diagnosed with COVID-19? No

## 2020-05-05 NOTE — Progress Notes (Signed)
Spiritual care group on grief and loss facilitated by chaplain Breyana Follansbee MDiv, [facility]  Group Goal:  Support / Education around grief and loss Members engage in facilitated group support and psycho-social education.  Group Description:  Following introductions and group rules, group members engaged in facilitated group dialog and support around topic of loss, with particular support around experiences of loss in their lives. Group Identified types of loss (relationships / self / things) and identified patterns, circumstances, and changes that precipitate losses. Reflected on thoughts / feelings around loss, normalized grief responses, and recognized variety in grief experience.   Group noted Worden's four tasks of grief in discussion.  Group drew on Adlerian / Rogerian, narrative, MI, Patient Progress:  

## 2020-05-09 ENCOUNTER — Other Ambulatory Visit: Payer: Self-pay

## 2020-05-09 DIAGNOSIS — I480 Paroxysmal atrial fibrillation: Secondary | ICD-10-CM

## 2020-05-09 DIAGNOSIS — R079 Chest pain, unspecified: Secondary | ICD-10-CM

## 2020-05-09 NOTE — Progress Notes (Signed)
Ordered MRI per Dr. Eden Emms.

## 2020-05-10 ENCOUNTER — Telehealth: Payer: Self-pay | Admitting: Cardiovascular Disease

## 2020-05-10 NOTE — Telephone Encounter (Signed)
Left message for patient to call and discuss scheduling Cardiac MRI ordered by Dr. Eden Emms

## 2020-05-11 ENCOUNTER — Other Ambulatory Visit: Payer: Self-pay

## 2020-05-11 ENCOUNTER — Encounter: Payer: Self-pay | Admitting: Cardiovascular Disease

## 2020-05-11 ENCOUNTER — Ambulatory Visit: Payer: BC Managed Care – PPO | Admitting: Genetic Counselor

## 2020-05-11 NOTE — Telephone Encounter (Signed)
Spoke with patient regarding appointment for Cardiac MRI scheduled Wednesday 06/21/20 at 8:00 am at [facility]---arrival time is 7:30 am 1st floor admissions office.  Will mail information to patient and she voiced her understanding

## 2020-05-18 MED ORDER — DOFETILIDE 500 MCG PO CAPS: 500.0000 ug | ORAL_CAPSULE | Freq: Two times a day (BID) | ORAL | 2 refills | Status: DC

## 2020-05-21 NOTE — Progress Notes (Addendum)
Pre Test GC  Referring Provider: [name], MD   Referral Reason Regina Morales was referred for genetic consult and testing for Non-ischemic cardiomyopathy (NICM) subsequent to sudden death in her daughter earlier this year.  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. She reports being diagnosed with Afib in 2014 subsequent to workup for panic attack at work. She reports having a 30 day Holter monitor which detected heart rates >175bpm and put on metoprolol and flecainide. She states that she still has Afib abut 1-2x per week. Her last cardiac imaging was performed in 2018 that showed normal cardiac morphology. She is expected to undergo cardiac imaging in the near future.  Family history Regina Morales (III.8) has two sons, ages 104 and 39 (IV.15, IV.16) and a daughter who died suddenly at age 17 (IV.17). She tells me that her daughter, Regina Morales (IV.17) was home with her 1 y.o. daughter on Easter Sunday when she had sudden cardiac arrest. Her daughter reports that she saw her mother arch her back and collapse. She did not call 911 but instead called her dad who was in the store with their 45 y.o son. He had left his phone in the car and did not see her call come through. When he reached home, he found his wife non-responsive and without a pulse. EMS was called and she was taken to the hospital at [place], [place]. She was airlifted to [place], [place] on a ventilator and had an EF of 15%. While there her brain function began declining and she was taken off the ventilator on Apr. 14th. Her heart was sent [facility] for an autopsy and the family was told that it could take 6 months for the autopsy results to be sent out. Regina Morales, understandably is in distress about her daughter's sudden death.  She also reports sudden cardiac arrest at age 35 in her youngest daughter, Regina Morales, now age 31 (IV.18) who has a  different father. Regina Morales tells me that her husband heard her fall off the bed one night and went upstairs to check on her. He found her on the floor with a nosebleed. His mother who is a Charity fundraiser was at their home and found that she did not have a pulse. She started CPR and called the EMS. She was taken to the hospital and placed in a medically induced coma. She states that her daughter had a EF of 105 at that time. A dual pacemaker-defibrillator was implanted and she has been shocked several times. She states that her heart function is now at 30%-35%. Regina Morales's kids have been screened and her eldest son was found to have WPW syndrome. He has undergone an ablation for this.   Regina Morales is the youngest of 8 children (III.1-III.8). All her siblings are in mostly good health, except her elder brother (III.3) who also has Afib and underwent an ablation at age 40. There are no reports of sudden cardiac arrest or death amongst her siblings or parents, but does report sudden death in her paternal grandfather in his 45s. She tells me that worked for the circus, was in good health and had no prior heart issues. He was home on leave when he suddenly collapsed and died. An autopsy was not done, but she states that his death certificate gives sudden cardiac arrest and infarction as cause of death. Regina Morales's father  (II.1) is the only child of his parents. He was an alcoholic and  smoker and had multiple health issues. He had a heart attack at 51 and had a stent placed at that time. He succumbed to liver failure at age 51.  Regina Morales's mother (II.2) had kidney failure at 51, underwent a kidney transplant at 37, was found to have CHF at 5 and died of CHF at 7. No heart disease was reported amongst her siblings (II.3-II.7) or parents (I.3, I.4).   Pre-test Genetic Consult notes  I reviewed the different genetic cardiomyopathies that are considered as non-ischemic cardiomyopathy, namely ARVC (now called ACM), DCM, HCM and LVNC. We  also reviewed cardiac and systemic conditions that can cause DCM, namely myocarditis, ischemic heart disease, infiltrative myocardial disease (amyloidosis, sarcoidosis, hemochromatosis, hypertension, infection with HIV virus, connective tissue disease (such as systemic lupus erythematosus etc.), substance abuse, doxorubicin therapy and other cardiovascular diseases (valvular heart disease, HCM). Regina Morales denies the above cardiac conditions or exposure to environmental factors that could lead to a cardiomyopathy.   I discussed the genetics of HCM, DCM, LVNC and ACM, namely inheritance, incomplete penetrance, variable expression and digenic/compound mutations that can be seen in some patients. We walked through the process of genetic testing. I explained to her that there are three possible outcomes of genetic testing; namely positive, negative and finding a variant of unknown significance. A positive outcome can be expected in cases that do not have risk factors for a cardiomyopathy, present early in life with increased severity and have a family history of sudden cardiac death and/or a relative that has been diagnosed with cardiomyopathy. Limitations in current genetic testing methodology can produce a negative result. Variants of unknown significance (VUS) are also observed. I explained to her that typically a VUS is so classified if the variant is not well understood as very few individuals have been reported to harbor this variant or its role in gene function has not been elucidated. The potential outcomes of genetic testing and subsequent management of at-risk family members were discussed so as to manage expectations.   Impression  In summary, there is a significant family history of sudden cardiac arrest in her daughters- both showing reduced EF indicative of NICM. Since both daughters have different fathers, it is highly likely that they have inherited the condition from Regina Morales. Additionally, she reports  sudden death in her paternal grandfather at a young age. In the absence of risk factors that can result in NICM, it is highly likely that she inherited this condition from her paternal lineage. This is an autosomal dominant condition with each first-degree relative at a 50% risk of inheriting NICM.   Genetic testing for genes implicated in nonischemic cardiomyopathy is highly recommended. The test will determine the underlying genetic basis of his condition and stratify risk of NICM in his family.   In addition, we discussed the protections afforded by the Genetic Information Non-Discrimination Act (GINA). I explained to her that GINA protects her from losing her employment or health insurance based on her genotype. However, these protections do not cover life insurance and disability. She verbalized understanding of this and states that her sons have life insurance through their jobs.  Please note that the patient has not been counseled in this visit on personal, cultural or ethical issues that she may face due to her heart condition.   Plan After a thorough discussion of the risk and benefits of genetic testing for HCM, Salia states her intent to pursue genetic testing for NICM and signed the informed consent form. Blood was drawn today  for testing.     Sidney Ace, Ph.D, [facility] Clinical Molecular Geneticist

## 2020-05-22 ENCOUNTER — Telehealth: Payer: Self-pay

## 2020-05-22 DIAGNOSIS — Z8489 Family history of other specified conditions: Secondary | ICD-10-CM

## 2020-05-22 DIAGNOSIS — R079 Chest pain, unspecified: Secondary | ICD-10-CM

## 2020-05-22 DIAGNOSIS — I480 Paroxysmal atrial fibrillation: Secondary | ICD-10-CM

## 2020-05-22 NOTE — Telephone Encounter (Signed)
-----   Message from Wendall Stade, MD sent at 05/22/2020  8:16 AM EDT ----- Elita Quick can you order this can order echo if not done in last year ----- Message ----- From: Glennon Mac, PhD Sent: 05/21/2020  12:15 PM EDT To: Wendall Stade, MD, Will Jorja Loa, MD  Please review notes. For some reason I cannot add the pedigree at this time. I will reach out to [facility] this week to ensure that the family tree can be inserted in the text.   Meanwhile, could you please place an order in Epic  For NICM genetic test (Lab tests> [facility]> NICM). Please note that in order to obtain her PA for NICM test we will need a recent imaging study to indicate low EF. If an Echo can be done that would suffice for now, her cMRI can be done later if possible.   Thanks for the referral [facility]

## 2020-05-22 NOTE — Telephone Encounter (Signed)
Placed ordered for echo and genetic testing.

## 2020-06-02 NOTE — Telephone Encounter (Addendum)
Echo is scheduled for 06/15/20. Called patient and left a message for her to call our office back. Will have patient go to [facility] on first floor to get lab work done when she comes for her echocardiogram on 06/15/20.

## 2020-06-05 ENCOUNTER — Other Ambulatory Visit (HOSPITAL_COMMUNITY): Payer: Self-pay | Admitting: Nurse Practitioner

## 2020-06-15 ENCOUNTER — Ambulatory Visit (HOSPITAL_COMMUNITY): Payer: BC Managed Care – PPO | Attending: Cardiology

## 2020-06-15 ENCOUNTER — Other Ambulatory Visit: Payer: Self-pay

## 2020-06-15 DIAGNOSIS — Z8489 Family history of other specified conditions: Secondary | ICD-10-CM | POA: Diagnosis present

## 2020-06-15 DIAGNOSIS — I480 Paroxysmal atrial fibrillation: Secondary | ICD-10-CM | POA: Diagnosis present

## 2020-06-15 DIAGNOSIS — R079 Chest pain, unspecified: Secondary | ICD-10-CM | POA: Insufficient documentation

## 2020-06-15 LAB — ECHOCARDIOGRAM COMPLETE
Area-P 1/2: 3.08 cm2
S' Lateral: 3.3 cm

## 2020-06-19 ENCOUNTER — Telehealth: Payer: Self-pay

## 2020-06-19 ENCOUNTER — Telehealth (HOSPITAL_COMMUNITY): Payer: Self-pay | Admitting: Emergency Medicine

## 2020-06-19 NOTE — Telephone Encounter (Signed)
The patient has been notified of the result and verbalized understanding.  All questions (if any) were answered. Leanord Hawking, RN 06/19/2020 1:39 PM

## 2020-06-19 NOTE — Telephone Encounter (Signed)
-----   Message from Wendall Stade, MD sent at 06/15/2020  5:02 PM EDT ----- Normal echo with good EF and no significant valvular heart disease

## 2020-06-19 NOTE — Telephone Encounter (Signed)
Attempted to call patient regarding upcoming cardiac CT appointment. °Left message on voicemail with name and callback number °Corneisha Alvi RN Navigator Cardiac Imaging °[facility] °[phone] Office °[phone] Cell ° °

## 2020-06-20 ENCOUNTER — Telehealth (HOSPITAL_COMMUNITY): Payer: Self-pay | Admitting: Emergency Medicine

## 2020-06-20 NOTE — Telephone Encounter (Signed)
Reaching out to patient to offer assistance regarding upcoming cardiac imaging study; pt verbalizes understanding of appt date/time, parking situation and where to check in, pre-test NPO status and medications ordered, and verified current allergies; name and call back number provided for further questions should they arise Regina Alexandria RN Navigator Cardiac Imaging [facility] [phone] office [phone] cell  Denies implants, reports some claustrophobia but tolerated being in a tanning bed.

## 2020-06-21 ENCOUNTER — Other Ambulatory Visit: Payer: Self-pay

## 2020-06-21 ENCOUNTER — Ambulatory Visit (HOSPITAL_COMMUNITY)
Admission: RE | Admit: 2020-06-21 | Discharge: 2020-06-21 | Disposition: A | Discharge status: Home / Self Care | Payer: BC Managed Care – PPO | Source: Ambulatory Visit | Attending: Cardiovascular Disease | Admitting: Cardiovascular Disease

## 2020-06-21 DIAGNOSIS — R079 Chest pain, unspecified: Secondary | ICD-10-CM

## 2020-06-21 DIAGNOSIS — I480 Paroxysmal atrial fibrillation: Secondary | ICD-10-CM | POA: Insufficient documentation

## 2020-06-21 MED ORDER — GADOBUTROL 1 MMOL/ML IV SOLN
10.0000 mL | Freq: Once | INTRAVENOUS | Status: AC | PRN
  Administered 2020-06-21: 10 mL via INTRAVENOUS

## 2020-07-24 ENCOUNTER — Other Ambulatory Visit: Payer: Self-pay | Admitting: *Deleted

## 2020-07-24 MED ORDER — APIXABAN 5 MG PO TABS: ORAL_TABLET | ORAL | 1 refills | Status: DC

## 2020-07-25 ENCOUNTER — Telehealth: Payer: Self-pay

## 2020-07-25 NOTE — Telephone Encounter (Signed)
**Note De-Identified  Obfuscation** I started a Eliquis PA through covermymeds: Key: [id number]

## 2020-07-27 NOTE — Telephone Encounter (Signed)
**Note De-Identified  Obfuscation** Message received from Covermymeds: Shamecka [place] Key: [id number] - PA Case ID: [id number] Outcome  This request has been approved using information available on the patient's profile. Case [id number]; Status:Approved;Review Type:Prior Auth; Coverage Start Date:06/25/2020;Coverage End Date:07/25/2021 Drug Eliquis 5MG  tablets  Form[employer] Scripts Electronic PA Form [phone] NCPDP)  Original Claim Info75 CALL HELP DESKFOR EMRG OVD, SCC=13 PER [facility] DISCRETION

## 2020-09-04 ENCOUNTER — Other Ambulatory Visit: Payer: Self-pay | Admitting: Cardiology

## 2020-09-22 ENCOUNTER — Ambulatory Visit (INDEPENDENT_AMBULATORY_CARE_PROVIDER_SITE_OTHER): Payer: BC Managed Care – PPO | Admitting: Physician Assistant

## 2020-09-22 ENCOUNTER — Encounter: Payer: Self-pay | Admitting: Physician Assistant

## 2020-09-22 ENCOUNTER — Other Ambulatory Visit: Payer: Self-pay

## 2020-09-22 VITALS — BP 120/78 | HR 61 | Temp 98.2°F | Resp 16 | Ht 67.0 in | Wt 313.0 lb

## 2020-09-22 DIAGNOSIS — F5102 Adjustment insomnia: Secondary | ICD-10-CM | POA: Diagnosis not present

## 2020-09-22 DIAGNOSIS — F325 Major depressive disorder, single episode, in full remission: Secondary | ICD-10-CM

## 2020-09-22 MED ORDER — LORAZEPAM 0.5 MG PO TABS
0.5000 mg | ORAL_TABLET | Freq: Every evening | ORAL | 1 refills | Status: DC | PRN
Start: 2020-09-22 — End: 2021-04-27

## 2020-09-22 NOTE — Patient Instructions (Addendum)
Please continue chronic medications with the following exceptions: - Do not take the hydroxyzine. - Use can use the Lorazepam as directed to help with sleep/acute anxiety. This is a short-term medication to help during the holidays. If needing longer you will have to discuss with your psychiatrist.   Sherri Rad in there!

## 2020-09-25 NOTE — Progress Notes (Signed)
Patient presents to clinic today to discuss anxiety and depression.  Patient is followed by psychiatry whom she recently saw last week due to deterioration in her symptoms.  Patient notes daily depressed mood with anhedonia especially since passing her daughter in the spring of this year.  She has gone to grief counseling and group therapy.  Recently returned back to work.  As the holidays are worsening things.  Notes increased anxiety with panic attack and crying spells.  Sleep is still very limited.  Notes that her psychiatrist increased her Latuda to 80 mg.  She is also taking duloxetine 60 mg daily.  Notes hydroxyzine was added to her regimen to help with sleep.  Giving her history of A. fib and being on Tikosyn, she notes checking with the clinical pharmacist at her cardiology office who told her to avoid using hydroxyzine.  Patient states she spoke to her psychiatrist about this who recommended a benzodiazepine but was unable to fill as her recent visits have been via video.  States she has to have an in office visit to get the medication but they told her would be a couple months before they can get her in for an office evaluation.  She does have a video visit follow-up with him next week.  Patient states she is unsure what to do and appreciates our input.  Past Medical History:  Diagnosis Date  . Anemia    hx years ago  . Buzzing in ear    "right; even after OR"  . Chest pain    a. 2009: neg MV  ([facility])  . Depression   . DVT (deep vein thrombosis) in pregnancy 1980's   LLE  . Fibromyalgia    "dx'd many many years ago; I haven't had any problems w/it" (04/15/2017)  . GERD (gastroesophageal reflux disease)   . Hepatic steatosis   . High cholesterol   . History of blood transfusion    "low count after appendix surgery" unsure # of units transfused  . Hypertension   . Migraines    "maybe once/2 months" (04/15/2017)  . Obesity   . OSA on CPAP   . Persistent atrial fibrillation (HCC)     a. Dx 07/2013;  b. CHA2DS2VASc=1 (female);  c. 07/2013 Echo:  EF 50-55%, no rwma, mod LVH.  . Pulmonary embolism (HCC) ~ 1984   "after I had my last child"  . SVD (spontaneous vaginal delivery)    x 4    Current Outpatient Medications on File Prior to Visit  Medication Sig Dispense Refill  . albuterol (VENTOLIN HFA) 108 (90 Base) MCG/ACT inhaler Inhale 2 puffs into the lungs every 6 (six) hours as needed for wheezing or shortness of breath.    Marland Kitchen apixaban (ELIQUIS) 5 MG TABS tablet TAKE 1 TABLET(5 MG) BY MOUTH TWICE DAILY 180 tablet 1  . diltiazem (CARDIZEM) 30 MG tablet Take 1 tablet every 4 hours AS NEEDED for AFIB heart rate >100 45 tablet 3  . dofetilide (TIKOSYN) 500 MCG capsule Take 1 capsule (500 mcg total) by mouth 2 (two) times daily. 180 capsule 2  . DULoxetine (CYMBALTA) 60 MG capsule Take 1 capsule (60 mg total) by mouth daily. 30 capsule 0  . LATUDA 80 MG TABS tablet Take 80 mg by mouth at bedtime.    . metoprolol tartrate (LOPRESSOR) 50 MG tablet TAKE 1 TABLET TWICE A DAY 180 tablet 2  . topiramate (TOPAMAX) 50 MG tablet Take 50 mg by mouth 2 (two) times daily.  No current facility-administered medications on file prior to visit.    Allergies  Allergen Reactions  . Codeine Nausea And Vomiting, Other (See Comments) and Nausea Only    Family History  Problem Relation Age of Onset  . Lung cancer Father   . Alcohol abuse Father   . Kidney failure Mother        s/p renal Tx  . Congestive Heart Failure Mother   . Atrial fibrillation Brother   . Sudden Cardiac Death Daughter        cardiac arrest  . Cardiomyopathy Daughter   . Evelene Croon Parkinson White syndrome Grandchild         granddaughter    Social History   Socioeconomic History  . Marital status: Divorced    Spouse name: Not on file  . Number of children: 1  . Years of education: Not on file  . Highest education level: Not on file  Occupational History  . Not on file  Tobacco Use  . Smoking status: Former  Smoker    Packs/day: 1.00    Years: 8.00    Pack years: 8.00    Types: Cigarettes    Quit date: 11/04/1990    Years since quitting: 29.9  . Smokeless tobacco: Never Used  Vaping Use  . Vaping Use: Never used  Substance and Sexual Activity  . Alcohol use: Not Currently  . Drug use: No  . Sexual activity: Not Currently    Birth control/protection: Post-menopausal  Other Topics Concern  . Not on file  Social History Narrative   Lives in [place] with sister.  Does not routinely exercise.  Works in Teacher, early years/pre for [employer].    Social Determinants of Health   Financial Resource Strain:   . Difficulty of Paying Living Expenses: Not on file  Food Insecurity:   . Worried About Programme researcher, broadcasting/film/video in the Last Year: Not on file  . Ran Out of Food in the Last Year: Not on file  Transportation Needs:   . Lack of Transportation (Medical): Not on file  . Lack of Transportation (Non-Medical): Not on file  Physical Activity:   . Days of Exercise per Week: Not on file  . Minutes of Exercise per Session: Not on file  Stress:   . Feeling of Stress : Not on file  Social Connections:   . Frequency of Communication with Friends and Family: Not on file  . Frequency of Social Gatherings with Friends and Family: Not on file  . Attends Religious Services: Not on file  . Active Member of Clubs or Organizations: Not on file  . Attends Banker Meetings: Not on file  . Marital Status: Not on file   Review of Systems - See HPI.  All other ROS are negative.  BP 120/78   Pulse 61   Temp 98.2 F (36.8 C) (Temporal)   Resp 16   Ht 5\' 7"  (1.702 m)   Wt (!) 313 lb (142 kg)   SpO2 98%   BMI 49.02 kg/m   Physical Exam Vitals reviewed.  Constitutional:      Appearance: Normal appearance.  HENT:     Head: Normocephalic and atraumatic.  Cardiovascular:     Rate and Rhythm: Normal rate and regular rhythm.     Pulses: Normal pulses.     Heart sounds: Normal heart sounds.  Pulmonary:      Effort: Pulmonary effort is normal.  Musculoskeletal:     Cervical back: Neck supple.  Neurological:     General: No focal deficit present.     Mental Status: She is alert and oriented to person, place, and time.  Psychiatric:        Attention and Perception: Attention and perception normal.        Mood and Affect: Mood is anxious and depressed. Affect is tearful.    Assessment/Plan: 1. Major depressive disorder in full remission, unspecified whether recurrent (HCC) 2. Adjustment insomnia Patient to continue management per psychiatry with the following change.  Will remain off hydroxyzine is not safe to take with her Tikosyn.  Will start low-dose Ativan as directed if needed for more acute anxiety.  PDMP reviewed with no red flags.  This is to be taken over by her psychiatrist at time of next in office assessment.  Encouraged her to continue with her counseling and group share.  Strict return ER precautions discussed with patient who voiced understanding and agreement with the plan.  This visit occurred during the SARS-CoV-2 public health emergency.  Safety protocols were in place, including screening questions prior to the visit, additional usage of staff PPE, and extensive cleaning of exam room while observing appropriate contact time as indicated for disinfecting solutions.     Piedad Climes, PA-C

## 2020-10-05 ENCOUNTER — Ambulatory Visit: Payer: BC Managed Care – PPO | Admitting: Cardiovascular Disease

## 2020-10-10 ENCOUNTER — Ambulatory Visit: Payer: BC Managed Care – PPO | Admitting: Cardiology

## 2020-10-10 NOTE — Progress Notes (Deleted)
Electrophysiology Office Note   Date:  10/10/2020   ID:  Regina Morales, DOB [birth year], MRN [id number]  PCP:  Noel Journey  Cardiologist:  Eden Emms Primary Electrophysiologist:  Dr Elberta Fortis    CC: Follow up for paroxsymal atrial fibrillation.   History of Present Illness: Regina Morales is a 59 y.o. female who is being seen today for the evaluation of atrial fibrillation at the request of Rudi Coco. Presenting today for electrophysiology evaluation.    She has a history significant for paroxysmal atrial fibrillation, hypertension, and obstructive sleep apnea on CPAP.  She initially took flecainide for her atrial fibrillation but began having more episodes.  She avoided both caffeine and alcohol.  She was admitted to the hospital 09/15/2018 for dofetilide loading.  She has a family history of sudden death in the 2 of her children.  He has seen the genetic counselor for this.  Today, denies symptoms of palpitations, chest pain, shortness of breath, orthopnea, PND, lower extremity edema, claudication, dizziness, presyncope, syncope, bleeding, or neurologic sequela. The patient is tolerating medications without difficulties. ***    Past Medical History:  Diagnosis Date  . Anemia    hx years ago  . Buzzing in ear    "right; even after OR"  . Chest pain    a. 2009: neg MV  ([facility])  . Depression   . DVT (deep vein thrombosis) in pregnancy 1980's   LLE  . Fibromyalgia    "dx'd many many years ago; I haven't had any problems w/it" (04/15/2017)  . GERD (gastroesophageal reflux disease)   . Hepatic steatosis   . High cholesterol   . History of blood transfusion    "low count after appendix surgery" unsure # of units transfused  . Hypertension   . Migraines    "maybe once/2 months" (04/15/2017)  . Obesity   . OSA on CPAP   . Persistent atrial fibrillation (HCC)    a. Dx 07/2013;  b. CHA2DS2VASc=1 (female);  c. 07/2013 Echo:  EF 50-55%, no rwma, mod LVH.  . Pulmonary  embolism (HCC) ~ 1984   "after I had my last child"  . SVD (spontaneous vaginal delivery)    x 4   Past Surgical History:  Procedure Laterality Date  . APPENDECTOMY  1990's  . COLONOSCOPY    . DILATATION & CURETTAGE/HYSTEROSCOPY WITH MYOSURE N/A 04/14/2017   Procedure: DILATATION & CURETTAGE/HYSTEROSCOPY WITH MYOSURE;  Surgeon: Myna Hidalgo, DO;  Location: [facility];  Service: Gynecology;  Laterality: N/A;  Polypectomy  . DILATION AND CURETTAGE OF UTERUS  1983   mab  . INNER EAR SURGERY Right 2000's  . LEFT HEART CATH AND CORONARY ANGIOGRAPHY N/A 04/17/2017   Procedure: Left Heart Cath and Coronary Angiography;  Surgeon: Corky Crafts, MD;  Location: [facility] INVASIVE CV LAB;  Service: Cardiovascular;  Laterality: N/A;  . PLANTAR FASCIA RELEASE Right 07/2015  . TUBAL LIGATION  1985     Current Outpatient Medications  Medication Sig Dispense Refill  . albuterol (VENTOLIN HFA) 108 (90 Base) MCG/ACT inhaler Inhale 2 puffs into the lungs every 6 (six) hours as needed for wheezing or shortness of breath.    Marland Kitchen apixaban (ELIQUIS) 5 MG TABS tablet TAKE 1 TABLET(5 MG) BY MOUTH TWICE DAILY 180 tablet 1  . diltiazem (CARDIZEM) 30 MG tablet Take 1 tablet every 4 hours AS NEEDED for AFIB heart rate >100 45 tablet 3  . dofetilide (TIKOSYN) 500 MCG capsule Take 1 capsule (500 mcg total) by  mouth 2 (two) times daily. 180 capsule 2  . DULoxetine (CYMBALTA) 60 MG capsule Take 1 capsule (60 mg total) by mouth daily. 30 capsule 0  . LATUDA 80 MG TABS tablet Take 80 mg by mouth at bedtime.    Marland Kitchen LORazepam (ATIVAN) 0.5 MG tablet Take 1 tablet (0.5 mg total) by mouth at bedtime as needed for anxiety or sleep. 30 tablet 1  . metoprolol tartrate (LOPRESSOR) 50 MG tablet TAKE 1 TABLET TWICE A DAY 180 tablet 2  . topiramate (TOPAMAX) 50 MG tablet Take 50 mg by mouth 2 (two) times daily.     No current facility-administered medications for this visit.    Allergies:   Codeine   Social History:  The patient   reports that she quit smoking about 29 years ago. Her smoking use included cigarettes. She has a 8.00 pack-year smoking history. She has never used smokeless tobacco. She reports previous alcohol use. She reports that she does not use drugs.   Family History:  The patient's family history includes Alcohol abuse in her father; Atrial fibrillation in her brother; Cardiomyopathy in her daughter; Congestive Heart Failure in her mother; Kidney failure in her mother; Lung cancer in her father; Sudden Cardiac Death in her daughter; Wolff Parkinson White syndrome in her grandchild.   ROS:  Please see the history of present illness.   Otherwise, review of systems is positive for none.   All other systems are reviewed and negative.   PHYSICAL EXAM: VS:  There were no vitals taken for this visit. , BMI There is no height or weight on file to calculate BMI. GEN: Well nourished, well developed, in no acute distress  HEENT: normal  Neck: no JVD, carotid bruits, or masses Cardiac: ***RRR; no murmurs, rubs, or gallops,no edema  Respiratory:  clear to auscultation bilaterally, normal work of breathing GI: soft, nontender, nondistended, + BS MS: no deformity or atrophy  Skin: warm and dry Neuro:  Strength and sensation are intact Psych: euthymic mood, full affect  EKG:  EKG {ACTION; IS/IS [id number]} ordered today. Personal review of the ekg ordered *** shows ***   Recent Labs: 04/29/2020: ALT 55; Hemoglobin 11.9; Platelets 276 04/30/2020: TSH 1.015 05/02/2020: BUN 16; Creatinine, Ser 0.72; Magnesium 2.3; Potassium 3.8; Sodium 141    Lipid Panel     Component Value Date/Time   CHOL 175 04/30/2020 0641   TRIG 114 04/30/2020 0641   HDL 40 (L) 04/30/2020 0641   CHOLHDL 4.4 04/30/2020 0641   VLDL 23 04/30/2020 0641   LDLCALC 112 (H) 04/30/2020 0641     Wt Readings from Last 3 Encounters:  09/22/20 (!) 313 lb (142 kg)  04/29/20 (!) 330 lb 11 oz (150 kg)  04/13/20 (!) 331 lb 3.2 oz (150.2 kg)       Other studies Reviewed: Additional studies/ records that were reviewed today include: Cardiac MRI 06/21/2020  review of the above records today demonstrates:  1. Normal cardiac MRI  2.  Normal cardiac valves  3.  Normal LVEF 56%  4.  Normal RV size and function  5.  No HOCM or delayed gadolinium uptake  6.  Normal aortic root 2.9 cm  LHC 04/17/17  The left ventricular systolic function is normal.  LV end diastolic pressure is moderately elevated. LVEDP 25 mm Hg.  The left ventricular ejection fraction is 50-55% by visual estimate.  There is no aortic valve stenosis.  No angiographically apparent coronary artery disease.   ASSESSMENT AND PLAN:  1.  Paroxysmal atrial fibrillation: Currently on dofetilide, high risk medication monitoring, and Eliquis.  CHA2DS2-VASc of 2.  ***  2.  Obstructive sleep apnea: CPAP compliance encouraged  3.  Hypertension:***  4.  Obesity: Diet and exercise encouraged There is no height or weight on file to calculate BMI.   Current medicines are reviewed at length with the patient today.   The patient does not have concerns regarding her medicines.  The following changes were made today: ***  Labs/ tests ordered today include:  No orders of the defined types were placed in this encounter.   Disposition:   FU with Dodger Sinning *** months  Signed, Yianna Tersigni Jorja Loa, MD  10/10/2020 7:42 AM     [facility] [address] [place] [address] [phone] (office) [phone] (fax)

## 2020-10-19 ENCOUNTER — Ambulatory Visit: Payer: BC Managed Care – PPO | Admitting: Cardiology

## 2020-10-19 NOTE — Progress Notes (Deleted)
Electrophysiology Office Note   Date:  10/19/2020   ID:  Regina Morales, DOB [birth year], MRN [id number]  PCP:  Noel Journey  Cardiologist:  Eden Emms Primary Electrophysiologist:  Dr Elberta Fortis    CC: Follow up for paroxsymal atrial fibrillation.   History of Present Illness: Regina Morales is a 59 y.o. female who is being seen today for the evaluation of atrial fibrillation at the request of Rudi Coco. Presenting today for electrophysiology evaluation.    She has a history of paroxysmal atrial fibrillation, hypertension, OSA on CPAP.  She took flecainide for her atrial fibrillation but had more frequent episodes.  She has since been loaded for dofetilide on 09/15/2018.  Unfortunately, she has had 2 sudden death episodes by her children.  She is seen genetic counseling which has found no link.  She had a cardiac MRI which showed no evidence of abnormality.  Today, denies symptoms of palpitations, chest pain, shortness of breath, orthopnea, PND, lower extremity edema, claudication, dizziness, presyncope, syncope, bleeding, or neurologic sequela. The patient is tolerating medications without difficulties. ***   Past Medical History:  Diagnosis Date  . Anemia    hx years ago  . Buzzing in ear    "right; even after OR"  . Chest pain    a. 2009: neg MV  ([facility])  . Depression   . DVT (deep vein thrombosis) in pregnancy 1980's   LLE  . Fibromyalgia    "dx'd many many years ago; I haven't had any problems w/it" (04/15/2017)  . GERD (gastroesophageal reflux disease)   . Hepatic steatosis   . High cholesterol   . History of blood transfusion    "low count after appendix surgery" unsure # of units transfused  . Hypertension   . Migraines    "maybe once/2 months" (04/15/2017)  . Obesity   . OSA on CPAP   . Persistent atrial fibrillation (HCC)    a. Dx 07/2013;  b. CHA2DS2VASc=1 (female);  c. 07/2013 Echo:  EF 50-55%, no rwma, mod LVH.  . Pulmonary embolism (HCC) ~ 1984    "after I had my last child"  . SVD (spontaneous vaginal delivery)    x 4   Past Surgical History:  Procedure Laterality Date  . APPENDECTOMY  1990's  . COLONOSCOPY    . DILATATION & CURETTAGE/HYSTEROSCOPY WITH MYOSURE N/A 04/14/2017   Procedure: DILATATION & CURETTAGE/HYSTEROSCOPY WITH MYOSURE;  Surgeon: Myna Hidalgo, DO;  Location: [facility];  Service: Gynecology;  Laterality: N/A;  Polypectomy  . DILATION AND CURETTAGE OF UTERUS  1983   mab  . INNER EAR SURGERY Right 2000's  . LEFT HEART CATH AND CORONARY ANGIOGRAPHY N/A 04/17/2017   Procedure: Left Heart Cath and Coronary Angiography;  Surgeon: Corky Crafts, MD;  Location: [facility] INVASIVE CV LAB;  Service: Cardiovascular;  Laterality: N/A;  . PLANTAR FASCIA RELEASE Right 07/2015  . TUBAL LIGATION  1985     Current Outpatient Medications  Medication Sig Dispense Refill  . albuterol (VENTOLIN HFA) 108 (90 Base) MCG/ACT inhaler Inhale 2 puffs into the lungs every 6 (six) hours as needed for wheezing or shortness of breath.    Marland Kitchen apixaban (ELIQUIS) 5 MG TABS tablet TAKE 1 TABLET(5 MG) BY MOUTH TWICE DAILY 180 tablet 1  . diltiazem (CARDIZEM) 30 MG tablet Take 1 tablet every 4 hours AS NEEDED for AFIB heart rate >100 45 tablet 3  . dofetilide (TIKOSYN) 500 MCG capsule Take 1 capsule (500 mcg total) by mouth 2 (two)  times daily. 180 capsule 2  . DULoxetine (CYMBALTA) 60 MG capsule Take 1 capsule (60 mg total) by mouth daily. 30 capsule 0  . LATUDA 80 MG TABS tablet Take 80 mg by mouth at bedtime.    Marland Kitchen LORazepam (ATIVAN) 0.5 MG tablet Take 1 tablet (0.5 mg total) by mouth at bedtime as needed for anxiety or sleep. 30 tablet 1  . metoprolol tartrate (LOPRESSOR) 50 MG tablet TAKE 1 TABLET TWICE A DAY 180 tablet 2  . topiramate (TOPAMAX) 50 MG tablet Take 50 mg by mouth 2 (two) times daily.     No current facility-administered medications for this visit.    Allergies:   Codeine   Social History:  The patient  reports that she quit  smoking about 29 years ago. Her smoking use included cigarettes. She has a 8.00 pack-year smoking history. She has never used smokeless tobacco. She reports previous alcohol use. She reports that she does not use drugs.   Family History:  The patient's family history includes Alcohol abuse in her father; Atrial fibrillation in her brother; Cardiomyopathy in her daughter; Congestive Heart Failure in her mother; Kidney failure in her mother; Lung cancer in her father; Sudden Cardiac Death in her daughter; Wolff Parkinson White syndrome in her grandchild.   ROS:  Please see the history of present illness.   Otherwise, review of systems is positive for none.   All other systems are reviewed and negative.   PHYSICAL EXAM: VS:  There were no vitals taken for this visit. , BMI There is no height or weight on file to calculate BMI. GEN: Well nourished, well developed, in no acute distress  HEENT: normal  Neck: no JVD, carotid bruits, or masses Cardiac: ***RRR; no murmurs, rubs, or gallops,no edema  Respiratory:  clear to auscultation bilaterally, normal work of breathing GI: soft, nontender, nondistended, + BS MS: no deformity or atrophy  Skin: warm and dry Neuro:  Strength and sensation are intact Psych: euthymic mood, full affect  EKG:  EKG {ACTION; IS/IS [id number]} ordered today. Personal review of the ekg ordered *** shows ***   Recent Labs: 04/29/2020: ALT 55; Hemoglobin 11.9; Platelets 276 04/30/2020: TSH 1.015 05/02/2020: BUN 16; Creatinine, Ser 0.72; Magnesium 2.3; Potassium 3.8; Sodium 141    Lipid Panel     Component Value Date/Time   CHOL 175 04/30/2020 0641   TRIG 114 04/30/2020 0641   HDL 40 (L) 04/30/2020 0641   CHOLHDL 4.4 04/30/2020 0641   VLDL 23 04/30/2020 0641   LDLCALC 112 (H) 04/30/2020 0641     Wt Readings from Last 3 Encounters:  09/22/20 (!) 313 lb (142 kg)  04/29/20 (!) 330 lb 11 oz (150 kg)  04/13/20 (!) 331 lb 3.2 oz (150.2 kg)      Other studies  Reviewed: Additional studies/ records that were reviewed today include: TTE 04/17/17  Review of the above records today demonstrates:  - Left ventricle: The cavity size was normal. Systolic function was   normal. The estimated ejection fraction was in the range of 55%   to 60%. Wall motion was normal; there were no regional wall   motion abnormalities. - Aortic valve: There was trivial regurgitation. - Left atrium: The atrium was mildly dilated.  LHC 04/17/17  The left ventricular systolic function is normal.  LV end diastolic pressure is moderately elevated. LVEDP 25 mm Hg.  The left ventricular ejection fraction is 50-55% by visual estimate.  There is no aortic valve stenosis.  No angiographically  apparent coronary artery disease.   ASSESSMENT AND PLAN:  1.  Paroxysmal atrial fibrillation: Currently on dofetilide and Eliquis.  CHA2DS2-VASc of 2.  High risk medication monitoring.  ***  2.  Obstructive sleep apnea: CPAP compliance encouraged  3.  Hypertension:***  4.  Morbid obesity: Diet and exercise encouraged There is no height or weight on file to calculate BMI.   Current medicines are reviewed at length with the patient today.   The patient does not have concerns regarding her medicines.  The following changes were made today: ***  Labs/ tests ordered today include:  No orders of the defined types were placed in this encounter.   Disposition:   FU with Kyrra Prada *** months  Signed, Makinze Jani Jorja Loa, MD  10/19/2020 8:26 AM     [facility] [address] [place] [address] [phone] (office) [phone] (fax)

## 2020-11-06 NOTE — Progress Notes (Deleted)
CARDIOLOGY OFFICE NOTE  Date:  11/06/2020    Trenyce A Martinique Date of Birth: 1961-08-07 Medical Record #[id number]  PCP:  Brunetta Jeans, PA-C  Cardiologist:  Johnsie Cancel  No chief complaint on file.   History of Present Illness: Quanetta A Martinique is a 60 y.o. female who presents today for f/U PAF and atypical chest pain. Primarily seen by afib clinic and EP Dr Curt Bears   She has a history of chest pain with a history of paroxysmal atrial fibrillation, CP, neg myoview in 09-Mar-2008, GERD, DVT and PE after childbirth    Cath 04/17/17 no CAD  Echo same day normal EF 55-60% mild LAE  Last seen by Doristine Devoid 08/27/19 She had fallen and broke both feet Could not ambulate and was at nursing facility QT was elevated on Tikosyn and Lexapro changed to Welbutrin Last ECG 09/23/19 SR rate 77 with QT 434 msec BMET on 10/27/19 K 3.9 Cr 1.01. Mg normal on 09/23/19   She has 4 children. 71 yo daughter had sudden death and survived Has AICD She lost a 73 yo daughter to sudden death Mar 10, 2019  She has two son's one in [place] and one in [place] who are currently ok One had ablation for WPW  She has some printed strips from her apple watch which showed PACl/ PVC;s  She had COVID in November and does not want vaccine   Seen by Lattie Corns 05/11/20  for genetic counseling and ordered genetic testing for cardiomyopathy   Last echo 06/15/20 EF 55-60% no LVH normal diastolic parameters   ***   Past Medical History:  Diagnosis Date  . Anemia    hx years ago  . Buzzing in ear    "right; even after OR"  . Chest pain    a. Mar 09, 2008: neg MV  ([facility])  . Depression   . DVT (deep vein thrombosis) in pregnancy 1980's   LLE  . Fibromyalgia    "dx'd many many years ago; I haven't had any problems w/it" (04/15/2017)  . GERD (gastroesophageal reflux disease)   . Hepatic steatosis   . High cholesterol   . History of blood transfusion    "low count after appendix surgery" unsure # of units transfused  . Hypertension    . Migraines    "maybe once/2 months" (04/15/2017)  . Obesity   . OSA on CPAP   . Persistent atrial fibrillation ([place])    a. Dx 07/2013;  b. CHA2DS2VASc=1 (female);  c. 07/2013 Echo:  EF 50-55%, no rwma, mod LVH.  . Pulmonary embolism ([place]) ~ 1983/03/10   "after I had my last child"  . SVD (spontaneous vaginal delivery)    x 4    Past Surgical History:  Procedure Laterality Date  . APPENDECTOMY  1990's  . COLONOSCOPY    . DILATATION & CURETTAGE/HYSTEROSCOPY WITH MYOSURE N/A 04/14/2017   Procedure: DILATATION & CURETTAGE/HYSTEROSCOPY WITH MYOSURE;  Surgeon: Janyth Pupa, DO;  Location: [facility];  Service: Gynecology;  Laterality: N/A;  Polypectomy  . DILATION AND CURETTAGE OF UTERUS  1983   mab  . INNER EAR SURGERY Right 2000's  . LEFT HEART CATH AND CORONARY ANGIOGRAPHY N/A 04/17/2017   Procedure: Left Heart Cath and Coronary Angiography;  Surgeon: Jettie Booze, MD;  Location: [facility];  Service: Cardiovascular;  Laterality: N/A;  . PLANTAR FASCIA RELEASE Right 07/2015  . TUBAL LIGATION  1985     Medications: No outpatient medications have been marked as taking for  the 11/09/20 encounter (Appointment) with Wendall Stade, MD.     Allergies: Allergies  Allergen Reactions  . Codeine Nausea And Vomiting, Other (See Comments) and Nausea Only    Social History: The patient  reports that she quit smoking about 30 years ago. Her smoking use included cigarettes. She has a 8.00 pack-year smoking history. She has never used smokeless tobacco. She reports previous alcohol use. She reports that she does not use drugs.   Family History: The patient's family history includes Alcohol abuse in her father; Atrial fibrillation in her brother; Cardiomyopathy in her daughter; Congestive Heart Failure in her mother; Kidney failure in her mother; Lung cancer in her father; Sudden Cardiac Death in her daughter; Wolff Parkinson White syndrome in her grandchild.   Review of  Systems: Please see the history of present illness.   Otherwise, the review of systems is positive for none.   All other systems are reviewed and negative.   Physical Exam: VS:  There were no vitals taken for this visit. Marland Kitchen  BMI There is no height or weight on file to calculate BMI.  Wt Readings from Last 3 Encounters:  09/22/20 (!) 142 kg  04/29/20 (!) 150 kg  04/13/20 (!) 150.2 kg   Affect appropriate Obese white female  HEENT: normal Neck supple with no adenopathy JVP normal no bruits no thyromegaly Lungs clear with no wheezing and good diaphragmatic motion Heart:  S1/S2 no murmur, no rub, gallop or click PMI normal Abdomen: benighn, BS positve, no tenderness, no AAA no bruit.  No HSM or HJR Distal pulses intact with no bruits No edema Neuro non-focal Skin warm and dry No muscular weakness    LABORATORY DATA:  EKG: . Reviewed apple watch strips see HPI  Lab Results  Component Value Date   WBC 7.5 04/29/2020   HGB 11.9 (L) 04/29/2020   HCT 37.7 04/29/2020   PLT 276 04/29/2020   GLUCOSE 112 (H) 05/02/2020   CHOL 175 04/30/2020   TRIG 114 04/30/2020   HDL 40 (L) 04/30/2020   LDLCALC 112 (H) 04/30/2020   ALT 55 (H) 04/29/2020   AST 69 (H) 04/29/2020   NA 141 05/02/2020   K 3.8 05/02/2020   CL 104 05/02/2020   CREATININE 0.72 05/02/2020   BUN 16 05/02/2020   CO2 25 05/02/2020   TSH 1.015 04/30/2020   INR 1.0 03/22/2019   HGBA1C 5.4 04/30/2020     BNP (last 3 results) No results for input(s): BNP in the last 8760 hours.  ProBNP (last 3 results) No results for input(s): PROBNP in the last 8760 hours.   Other Studies Reviewed Today:  Echo Study Conclusions  06/15/20  Normal EF 55-60% no LVH Normal RV function  No valve disease    Left Heart Cath and Coronary Angiography 04/2017  Conclusion     The left ventricular systolic function is normal.  LV end diastolic pressure is moderately elevated. LVEDP 25 mm Hg.  The left ventricular ejection  fraction is 50-55% by visual estimate.  There is no aortic valve stenosis.  No angiographically apparent coronary artery disease.   Consider diuresis.  Continue preventive therapy.    CT CHEST ANGIOGRAM IMPRESSION: 1. No evidence of pulmonary embolus. 2. Bilateral dependent subsegmental atelectasis noted. Lungs otherwise clear.   Electronically Signed   By: Roanna Raider M.D.   On: 04/15/2017 01:50  Assessment/Plan:  1. Chest pain - non cardiac cath 04/17/17 no CAD   2. PAF - maintaining NSR on  tikosyn and eliquis   3. Prior PE/DVT - most recent CT negative 04/15/17 on eliquis for PAF   4. Obesity - weight loss encouraged. Referred to [facility]   5. OSA:  F/u with primary for CPAP titration   6. HTN - controlled on her current regimen  7. Ortho:  F/u Orlan Leavens has had MRI needs PT/OT Has lumbar spine disease as well Activity limited   8. Family History of Sudden Death. Her two daughters have had events and they have different fathers so likely runs on patients side.  ***  F/U in a year    [name]

## 2020-11-09 ENCOUNTER — Ambulatory Visit: Payer: BC Managed Care – PPO | Admitting: Cardiovascular Disease

## 2020-11-24 ENCOUNTER — Telehealth (INDEPENDENT_AMBULATORY_CARE_PROVIDER_SITE_OTHER): Payer: BC Managed Care – PPO | Admitting: Physician Assistant

## 2020-11-24 ENCOUNTER — Other Ambulatory Visit: Payer: Self-pay

## 2020-11-24 ENCOUNTER — Encounter: Payer: Self-pay | Admitting: Physician Assistant

## 2020-11-24 DIAGNOSIS — M545 Low back pain, unspecified: Secondary | ICD-10-CM | POA: Diagnosis not present

## 2020-11-24 MED ORDER — CYCLOBENZAPRINE HCL 10 MG PO TABS: 10.0000 mg | ORAL_TABLET | Freq: Three times a day (TID) | ORAL | 0 refills | Status: DC | PRN

## 2020-11-24 NOTE — Progress Notes (Signed)
I have discussed the procedure for the virtual visit with the patient who has given consent to proceed with assessment and treatment.   Janille Draughon S Lissy Deuser, CMA     

## 2020-11-24 NOTE — Progress Notes (Signed)
Virtual Visit via Telephone Note  I connected with Regina Morales on 11/24/20 at  3:00 PM EST by telephone and verified that I am speaking with the correct person using two identifiers.  Location: Patient: Home Provider: [facility]   I discussed the limitations, risks, security and privacy concerns of performing an evaluation and management service by telephone and the availability of in person appointments. I also discussed with the patient that there may be a patient responsible charge related to this service. The patient expressed understanding and agreed to proceed.  History of Present Illness:  Patient endorses an injury to her lower back yesterday morning.  Notes she was getting into her truck having her left foot on icy ground, and the right foot on the running board.  Left leg slipped out from under her causing her to fall down onto her lower back.  Denies any head trauma or injury or loss of consciousness.  Was able to get herself up and walk unassisted.  Notes some initial discomfort but was overall feeling well.  Notes later last night into today she is having much more muscle soreness and stiffness.  Increased pain with bending and going from a sitting to standing position.  Left lower back is worse than right.  Denies any radiation of pain into lower extremities.  Denies any numbness, tingling or weakness of lower extremities.  Denies saddle anesthesia or change to bowel or bladder habits.  Denies any bruising, swelling or skin tear.  Did take some Advil last night with some improvement of her symptoms.  Of note, patient is on Eliquis.   Observations/Objective:  No labored breathing.  Speech is clear and coherent with logical content.  Patient is alert and oriented at baseline.   Assessment and Plan:  1. Acute bilateral low back pain without sciatica After a fall.  Thankfully no alarm signs or symptoms present.  We will have her start conservative treatment with rest, heat to the  lower back.  She is to avoid oral NSAIDs due to anticoagulation.  Can use topical Voltaren if needed.  Start Tylenol arthritis.  Rx Flexeril.  Encouraged her to get up and move around as tolerated but to avoid any strenuous activities.  If not improving over the weekend she will need an office evaluation.  Alarm signs/symptoms and strict ER precautions discussed with patient who voiced understanding and agreement with the plan.   Follow Up Instructions:  I discussed the assessment and treatment plan with the patient. The patient was provided an opportunity to ask questions and all were answered. The patient agreed with the plan and demonstrated an understanding of the instructions.   The patient was advised to call back or seek an in-person evaluation if the symptoms worsen or if the condition fails to improve as anticipated.  I provided 10 minutes of non-face-to-face time during this encounter.   Piedad Climes, PA-C

## 2020-12-22 ENCOUNTER — Other Ambulatory Visit: Payer: Self-pay | Admitting: Cardiology

## 2020-12-25 NOTE — Telephone Encounter (Signed)
Eliquis 5mg  refill request received. Patient is 60 years old, weight-142kg, Crea-0.72 on 05/02/2020, Diagnosis-Afib, and last seen by Dr. 05/04/2020 on 04/13/2020. Dose is appropriate based on dosing criteria. Will send in refill to requested pharmacy.

## 2021-02-08 ENCOUNTER — Telehealth: Payer: Self-pay | Admitting: Family Medicine

## 2021-02-08 NOTE — Telephone Encounter (Signed)
error 

## 2021-02-18 ENCOUNTER — Other Ambulatory Visit: Payer: Self-pay | Admitting: Cardiology

## 2021-03-15 ENCOUNTER — Ambulatory Visit (INDEPENDENT_AMBULATORY_CARE_PROVIDER_SITE_OTHER): Payer: BC Managed Care – PPO | Admitting: Cardiology

## 2021-03-15 ENCOUNTER — Encounter: Payer: Self-pay | Admitting: Cardiology

## 2021-03-15 ENCOUNTER — Other Ambulatory Visit: Payer: Self-pay

## 2021-03-15 VITALS — BP 138/74 | HR 61 | Ht 67.0 in | Wt 298.0 lb

## 2021-03-15 DIAGNOSIS — I480 Paroxysmal atrial fibrillation: Secondary | ICD-10-CM | POA: Diagnosis not present

## 2021-03-15 MED ORDER — ELIQUIS 5 MG PO TABS: 5.0000 mg | ORAL_TABLET | Freq: Two times a day (BID) | ORAL | 2 refills | Status: DC

## 2021-03-15 NOTE — Progress Notes (Signed)
Electrophysiology Office Note   Date:  03/15/2021   ID:  Regina Morales, DOB October 21, 1961, MRN [id number]  PCP:  Loyola Mast, MD  Cardiologist:  Eden Emms Primary Electrophysiologist:  Dr Elberta Fortis    CC: Follow up for paroxsymal atrial fibrillation.   History of Present Illness: Regina Morales is a 60 y.o. female who is being seen today for the evaluation of atrial fibrillation at the request of Rudi Coco. Presenting today for electrophysiology evaluation.    She has a history significant for paroxysmal atrial fibrillation, hypertension, OSA on CPAP.  She initially took flecainide for her atrial fibrillation but had more frequent episodes.  She has since been loaded on dofetilide.  Today, denies symptoms of palpitations, chest pain, shortness of breath, orthopnea, PND, lower extremity edema, claudication, dizziness, presyncope, syncope, bleeding, or neurologic sequela. The patient is tolerating medications without difficulties.  Since being seen she has done well.  She has noted no further episodes of atrial fibrillation.  Unfortunately, she continues to grieve the loss of her daughter who died 1 year ago.  She is currently on Cymbalta which she feels is helping with her grief and depression, though unfortunately she is having significant tremors on this medication.  She is going to see the prescriber to see about adjusting the dose.   Past Medical History:  Diagnosis Date  . Anemia    hx years ago  . Buzzing in ear    "right; even after OR"  . Chest pain    a. 2009: neg MV  ([facility])  . Depression   . DVT (deep vein thrombosis) in pregnancy 1980's   LLE  . Fibromyalgia    "dx'd many many years ago; I haven't had any problems w/it" (04/15/2017)  . GERD (gastroesophageal reflux disease)   . Hepatic steatosis   . High cholesterol   . History of blood transfusion    "low count after appendix surgery" unsure # of units transfused  . Hypertension   . Migraines    "maybe once/2  months" (04/15/2017)  . Obesity   . OSA on CPAP   . Persistent atrial fibrillation (HCC)    a. Dx 07/2013;  b. CHA2DS2VASc=1 (female);  c. 07/2013 Echo:  EF 50-55%, no rwma, mod LVH.  . Pulmonary embolism (HCC) ~ 1984   "after I had my last child"  . SVD (spontaneous vaginal delivery)    x 4   Past Surgical History:  Procedure Laterality Date  . APPENDECTOMY  1990's  . COLONOSCOPY    . DILATATION & CURETTAGE/HYSTEROSCOPY WITH MYOSURE N/A 04/14/2017   Procedure: DILATATION & CURETTAGE/HYSTEROSCOPY WITH MYOSURE;  Surgeon: Myna Hidalgo, DO;  Location: [facility];  Service: Gynecology;  Laterality: N/A;  Polypectomy  . DILATION AND CURETTAGE OF UTERUS  1983   mab  . INNER EAR SURGERY Right 2000's  . LEFT HEART CATH AND CORONARY ANGIOGRAPHY N/A 04/17/2017   Procedure: Left Heart Cath and Coronary Angiography;  Surgeon: Corky Crafts, MD;  Location: [facility] INVASIVE CV LAB;  Service: Cardiovascular;  Laterality: N/A;  . PLANTAR FASCIA RELEASE Right 07/2015  . TUBAL LIGATION  1985     Current Outpatient Medications  Medication Sig Dispense Refill  . albuterol (VENTOLIN HFA) 108 (90 Base) MCG/ACT inhaler Inhale 2 puffs into the lungs every 6 (six) hours as needed for wheezing or shortness of breath.    . cyclobenzaprine (FLEXERIL) 10 MG tablet Take 1 tablet (10 mg total) by mouth 3 (three) times daily as needed  for muscle spasms. 30 tablet 0  . diltiazem (CARDIZEM) 30 MG tablet Take 1 tablet every 4 hours AS NEEDED for AFIB heart rate >100 45 tablet 3  . dofetilide (TIKOSYN) 500 MCG capsule TAKE 1 CAPSULE(500 MCG) BY MOUTH TWICE DAILY 180 capsule 0  . DULoxetine (CYMBALTA) 60 MG capsule Take 1 capsule (60 mg total) by mouth daily. 30 capsule 0  . ELIQUIS 5 MG TABS tablet TAKE 1 TABLET(5 MG) BY MOUTH TWICE DAILY 180 tablet 1  . LORazepam (ATIVAN) 0.5 MG tablet Take 1 tablet (0.5 mg total) by mouth at bedtime as needed for anxiety or sleep. 30 tablet 1  . Lurasidone HCl 60 MG TABS Take 1 tablet  by mouth daily.    . metoprolol tartrate (LOPRESSOR) 50 MG tablet TAKE 1 TABLET TWICE A DAY 180 tablet 2  . topiramate (TOPAMAX) 50 MG tablet Take 50 mg by mouth 2 (two) times daily.    Marland Kitchen LATUDA 80 MG TABS tablet Take 40 mg by mouth every evening.     No current facility-administered medications for this visit.    Allergies:   Codeine   Social History:  The patient  reports that she quit smoking about 30 years ago. Her smoking use included cigarettes. She has a 8.00 pack-year smoking history. She has never used smokeless tobacco. She reports previous alcohol use. She reports that she does not use drugs.   Family History:  The patient's family history includes Alcohol abuse in her father; Atrial fibrillation in her brother; Cardiomyopathy in her daughter; Congestive Heart Failure in her mother; Kidney failure in her mother; Lung cancer in her father; Sudden Cardiac Death in her daughter; Wolff Parkinson White syndrome in her grandchild.   ROS:  Please see the history of present illness.   Otherwise, review of systems is positive for none.   All other systems are reviewed and negative.   PHYSICAL EXAM: VS:  BP 138/74   Pulse 61   Ht 5\' 7"  (1.702 m)   Wt 298 lb (135.2 kg)   BMI 46.67 kg/m  , BMI Body mass index is 46.67 kg/m. GEN: Well nourished, well developed, in no acute distress  HEENT: normal  Neck: no JVD, carotid bruits, or masses Cardiac: RRR; no murmurs, rubs, or gallops,no edema  Respiratory:  clear to auscultation bilaterally, normal work of breathing GI: soft, nontender, nondistended, + BS MS: no deformity or atrophy  Skin: warm and dry Neuro:  Strength and sensation are intact Psych: euthymic mood, full affect  EKG:  EKG is ordered today. Personal review of the ekg ordered shows sinus rhythm, rate 61, QTc 483 ms  Recent Labs: 04/29/2020: ALT 55; Hemoglobin 11.9; Platelets 276 04/30/2020: TSH 1.015 05/02/2020: BUN 16; Creatinine, Ser 0.72; Magnesium 2.3; Potassium 3.8;  Sodium 141    Lipid Panel     Component Value Date/Time   CHOL 175 04/30/2020 0641   TRIG 114 04/30/2020 0641   HDL 40 (L) 04/30/2020 0641   CHOLHDL 4.4 04/30/2020 0641   VLDL 23 04/30/2020 0641   LDLCALC 112 (H) 04/30/2020 0641     Wt Readings from Last 3 Encounters:  03/15/21 298 lb (135.2 kg)  09/22/20 (!) 313 lb (142 kg)  04/29/20 (!) 330 lb 11 oz (150 kg)      Other studies Reviewed: Additional studies/ records that were reviewed today include: TTE 04/17/17  Review of the above records today demonstrates:  - Left ventricle: The cavity size was normal. Systolic function was  normal. The estimated ejection fraction was in the range of 55%   to 60%. Wall motion was normal; there were no regional wall   motion abnormalities. - Aortic valve: There was trivial regurgitation. - Left atrium: The atrium was mildly dilated.  LHC 04/17/17  The left ventricular systolic function is normal.  LV end diastolic pressure is moderately elevated. LVEDP 25 mm Hg.  The left ventricular ejection fraction is 50-55% by visual estimate.  There is no aortic valve stenosis.  No angiographically apparent coronary artery disease.   ASSESSMENT AND PLAN:  1.  Paroxysmal atrial fibrillation: Currently on dofetilide and Eliquis.  High risk medication monitoring.  QTC remains acceptable.  CHA2DS2-VASc of 2.  Fortunately she remains in sinus rhythm.  She has no major complaints at this time.  She is able to do all of her daily activities.  She is going to see her psychiatrist to talk about adjusting her antidepressants.  I have told her to let her physicians know that she is on Tikosyn to avoid drug interactions.  2.  Obstructive sleep apnea: CPAP compliance encouraged  3.  Hypertension: Currently well controlled  4.  Obesity: Diet and exercise encouraged Body mass index is 46.67 kg/m.   Current medicines are reviewed at length with the patient today.   The patient does not have concerns  regarding her medicines.  The following changes were made today: None  Labs/ tests ordered today include:  Orders Placed This Encounter  Procedures  . EKG 12-Lead    Disposition:   FU with Imagene Boss 6 months  Signed, Demira Gwynne Jorja Loa, MD  03/15/2021 9:48 AM     [facility] [address] [place] [address] [phone] (office) [phone] (fax)

## 2021-03-15 NOTE — Addendum Note (Signed)
Addended by: Baird Lyons on: 03/15/2021 09:55 AM   Modules accepted: Orders

## 2021-03-16 NOTE — Progress Notes (Signed)
CARDIOLOGY OFFICE NOTE  Date:  03/23/2021    Regina Morales Date of Birth: [birth year] Medical Record #[id number]  PCP:  Loyola Mast, MD  Cardiologist:  Eden Emms EP:  Elberta Fortis  No chief complaint on file.   History of Present Illness:  60 y.o. history of PAF and atypical chest pain. No CAD on cath June 2018. She is obese Depression with daughter passing last year She had failed AAT with flecainide and is now on Tikosyn Seen by Dr Elberta Fortis 03/15/21 was in NSR with acceptable QT. TTE done 06/15/20 reviewed EF 55-60% no significant valve dx and LA diameter 3.8 cm   She has 4 children. 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 Anemia    hx years ago  . Buzzing in ear    "right; even after OR"  . Chest pain    a. 2009: neg MV  ([facility])  . Depression   . DVT (deep vein thrombosis) in pregnancy 1980's   LLE  . Fibromyalgia    "dx'd many many years ago; I haven't had any problems w/it" (04/15/2017)  . GERD (gastroesophageal reflux disease)   . Hepatic steatosis   . High cholesterol   . History of blood transfusion    "low count after appendix surgery" unsure # of units transfused  . Hypertension   . Migraines    "maybe once/2 months" (04/15/2017)  . Obesity   . OSA on CPAP   . Persistent atrial fibrillation (HCC)    a. Dx 07/2013;  b. CHA2DS2VASc=1 (female);  c. 07/2013 Echo:  EF 50-55%, no rwma, mod LVH.  . Pulmonary embolism (HCC) ~ 1984   "after I had my last child"  . SVD (spontaneous vaginal delivery)    x 4    Past Surgical History:  Procedure Laterality Date   . APPENDECTOMY  1990's  . COLONOSCOPY    . DILATATION & CURETTAGE/HYSTEROSCOPY WITH MYOSURE N/A 04/14/2017   Procedure: DILATATION & CURETTAGE/HYSTEROSCOPY WITH MYOSURE;  Surgeon: Myna Hidalgo, DO;  Location: [facility];  Service: Gynecology;  Laterality: N/A;  Polypectomy  . DILATION AND CURETTAGE OF UTERUS  1983   mab  . INNER EAR SURGERY Right 2000's  . LEFT HEART CATH AND CORONARY ANGIOGRAPHY N/A 04/17/2017   Procedure: Left Heart Cath and Coronary Angiography;  Surgeon: Corky Crafts, MD;  Location: [facility] INVASIVE CV LAB;  Service: Cardiovascular;  Laterality: N/A;  . PLANTAR FASCIA RELEASE Right 07/2015  . TUBAL LIGATION  1985     Medications: Current Meds  Medication Sig  . albuterol (VENTOLIN HFA) 108 (90 Base) MCG/ACT inhaler Inhale 2 puffs into the lungs every 6 (six) hours as needed for wheezing or shortness of breath.  Marland Kitchen apixaban (ELIQUIS) 5 MG TABS tablet Take 1 tablet (5 mg total) by mouth 2 (two) times daily.  . cyclobenzaprine (FLEXERIL) 10 MG tablet Take 1 tablet (10 mg total) by mouth 3 (three) times daily as needed for muscle spasms.  Marland Kitchen diltiazem (CARDIZEM) 30 MG tablet Take  1 tablet every 4 hours AS NEEDED for AFIB heart rate >100  . dofetilide (TIKOSYN) 500 MCG capsule TAKE 1 CAPSULE(500 MCG) BY MOUTH TWICE DAILY  . DULoxetine (CYMBALTA) 60 MG capsule Take 120 mg by mouth daily.  Marland Kitchen LATUDA 80 MG TABS tablet Take 40 mg by mouth every evening.  Marland Kitchen LORazepam (ATIVAN) 0.5 MG tablet Take 1 tablet (0.5 mg total) by mouth at bedtime as needed for anxiety or sleep.  . Lurasidone HCl 60 MG TABS Take 1 tablet by mouth daily.  . metoprolol tartrate (LOPRESSOR) 50 MG tablet TAKE 1 TABLET TWICE A DAY  . topiramate (TOPAMAX) 50 MG tablet Take 50 mg by mouth 2 (two) times daily.  . [DISCONTINUED] DULoxetine (CYMBALTA) 60 MG capsule Take 1 capsule (60 mg total) by mouth daily. (Patient taking differently: Take 120 mg by mouth daily.)     Allergies: Allergies  Allergen Reactions   . Codeine Nausea And Vomiting, Other (See Comments) and Nausea Only    Social History: The patient  reports that she quit smoking about 30 years ago. Her smoking use included cigarettes. She has a 8.00 pack-year smoking history. She has never used smokeless tobacco. She reports previous alcohol use. She reports that she does not use drugs.   Family History: The patient's family history includes Alcohol abuse in her father; Atrial fibrillation in her brother; Cardiomyopathy in her daughter; Congestive Heart Failure in her mother; Kidney failure in her mother; Lung cancer in her father; Sudden Cardiac Death in her daughter; Wolff Parkinson White syndrome in her grandchild.   Review of Systems: Please see the history of present illness.   Otherwise, the review of systems is positive for none.   All other systems are reviewed and negative.   Physical Exam: VS:  BP (!) 142/92   Pulse 63   Ht 5\' 7"  (1.702 m)   Wt 133.4 kg   SpO2 98%   BMI 46.05 kg/m  .  BMI Body mass index is 46.05 kg/m.  Wt Readings from Last 3 Encounters:  03/23/21 133.4 kg  03/15/21 135.2 kg  09/22/20 (!) 142 kg   Affect appropriate Obese  HEENT: normal Neck supple with no adenopathy JVP normal no bruits no thyromegaly Lungs clear with no wheezing and good diaphragmatic motion Heart:  S1/S2 no murmur, no rub, gallop or click PMI normal Abdomen: benighn, BS positve, no tenderness, no AAA no bruit.  No HSM or HJR Distal pulses intact with no bruits No edema Neuro non-focal Skin warm and dry No muscular weakness   LABORATORY DATA:  EKG: .  SR rate 68 QT 480 LAD 03/15/21   Lab Results  Component Value Date   WBC 7.5 04/29/2020   HGB 11.9 (L) 04/29/2020   HCT 37.7 04/29/2020   PLT 276 04/29/2020   GLUCOSE 112 (H) 05/02/2020   CHOL 175 04/30/2020   TRIG 114 04/30/2020   HDL 40 (L) 04/30/2020   LDLCALC 112 (H) 04/30/2020   ALT 55 (H) 04/29/2020   AST 69 (H) 04/29/2020   NA 141 05/02/2020   K 3.8  05/02/2020   CL 104 05/02/2020   CREATININE 0.72 05/02/2020   BUN 16 05/02/2020   CO2 25 05/02/2020   TSH 1.015 04/30/2020   INR 1.0 03/22/2019   HGBA1C 5.4 04/30/2020     BNP (last 3 results) No results for input(s): BNP in the last 8760 hours.  ProBNP (last 3 results) No results for input(s): PROBNP in the last 8760 hours.  Other Studies Reviewed Today:  Echo Study Conclusions 04/2017  - Left ventricle: The cavity size was normal. Systolic function was   normal. The estimated ejection fraction was in the range of 55%   to 60%. Wall motion was normal; there were no regional wall   motion abnormalities. - Aortic valve: There was trivial regurgitation. - Left atrium: The atrium was mildly dilated.   Left Heart Cath and Coronary Angiography 04/2017  Conclusion     The left ventricular systolic function is normal.  LV end diastolic pressure is moderately elevated. LVEDP 25 mm Hg.  The left ventricular ejection fraction is 50-55% by visual estimate.  There is no aortic valve stenosis.  No angiographically apparent coronary artery disease.   Consider diuresis.  Continue preventive therapy.    CT CHEST ANGIOGRAM IMPRESSION: 1. No evidence of pulmonary embolus. 2. Bilateral dependent subsegmental atelectasis noted. Lungs otherwise clear.   Electronically Signed   By: Roanna Raider M.D.   On: 04/15/2017 01:50  Assessment/Plan:  1. Chest pain - Cath 04/17/17 no CAD non cardiac resolved   2. PAF - maintaining NSR on tikosyn and eliquis f/u [facility] ok  05/02/20    3. Prior PE/DVT - distant on Eliquis for her PaF   4. Obesity - weight loss encouraged. Referred to [facility]   5. OSA:  F/u with primary for CPAP titration   6. HTN - Well controlled.  Continue current medications and low sodium Dash type diet.    7. Family History of Sudden Death. Her two daughters have had events and they have different fathers so possibility that  genetic abnormality runs with her Seen by Sidney Ace 05/11/20 and genetic testing for non ischemic DCM recommended    [name]

## 2021-03-20 ENCOUNTER — Ambulatory Visit: Payer: BC Managed Care – PPO | Admitting: Family Medicine

## 2021-03-23 ENCOUNTER — Ambulatory Visit (INDEPENDENT_AMBULATORY_CARE_PROVIDER_SITE_OTHER): Payer: BC Managed Care – PPO | Admitting: Cardiovascular Disease

## 2021-03-23 ENCOUNTER — Other Ambulatory Visit: Payer: Self-pay

## 2021-03-23 ENCOUNTER — Encounter: Payer: Self-pay | Admitting: Cardiovascular Disease

## 2021-03-23 VITALS — BP 142/92 | HR 63 | Ht 67.0 in | Wt 294.0 lb

## 2021-03-23 DIAGNOSIS — I480 Paroxysmal atrial fibrillation: Secondary | ICD-10-CM

## 2021-03-23 NOTE — Patient Instructions (Signed)

## 2021-04-27 ENCOUNTER — Encounter: Payer: Self-pay | Admitting: Family Medicine

## 2021-04-27 ENCOUNTER — Ambulatory Visit: Payer: BC Managed Care – PPO | Admitting: Family Medicine

## 2021-04-27 ENCOUNTER — Other Ambulatory Visit: Payer: Self-pay

## 2021-04-27 VITALS — BP 122/80 | HR 73 | Temp 96.9°F | Ht 68.0 in | Wt 293.0 lb

## 2021-04-27 DIAGNOSIS — Z23 Encounter for immunization: Secondary | ICD-10-CM | POA: Diagnosis not present

## 2021-04-27 DIAGNOSIS — G4733 Obstructive sleep apnea (adult) (pediatric): Secondary | ICD-10-CM | POA: Diagnosis not present

## 2021-04-27 DIAGNOSIS — I480 Paroxysmal atrial fibrillation: Secondary | ICD-10-CM | POA: Diagnosis not present

## 2021-04-27 DIAGNOSIS — F332 Major depressive disorder, recurrent severe without psychotic features: Secondary | ICD-10-CM

## 2021-04-27 DIAGNOSIS — J449 Chronic obstructive pulmonary disease, unspecified: Secondary | ICD-10-CM

## 2021-04-27 DIAGNOSIS — G43709 Chronic migraine without aura, not intractable, without status migrainosus: Secondary | ICD-10-CM | POA: Diagnosis not present

## 2021-04-27 DIAGNOSIS — Z1231 Encounter for screening mammogram for malignant neoplasm of breast: Secondary | ICD-10-CM

## 2021-04-27 NOTE — Progress Notes (Signed)
[facility] [address] [place] [address] Dept: [phone] Dept Fax: [phone]  Transfer of Care Office Visit  Subjective:    Patient ID: Regina Morales, female    DOB: May 04, 1961, 60 y.o..   MRN: [id number]  Chief Complaint  Patient presents with   Establish Care    NP- establish care.  C/o having back pain x 2 years. She takes Tylenol and Advil with little relief. Also having some bladder incontinence.   History of Present Illness:  Patient is in today to establish care. [name] is originally from [place], [place]. She moved to [place] 35 years ago to be near her sister, who had been in a MVA. She is single. She has had 4 children, however one of her daughters dies at age 20 from a cardiac arrest. Her other daughter has likewise had a cardiac arrest, but survived this and currently has a pacemaker. Her 2 sons are healthy. She currently has a roommate (who accompanied her today and assisted with providing history). [name] denies any current tobacco use (quit in 1992), alcohol, or drug use.   [name] has a history of severe depression that was worsened by the death of her daughter. She is currently managed by Dr. Axel Filler (psychiatry) and Monica Becton (therapist). She is seeing them on a regular basis. She was uncertain of all of her current medications, but appears to be on duloxetine and divalproex for her depression. She notes one medication (divalproex?) had been causing her to have a tremor. Her roommate notes that she was also having head bobbing as part of it. The dose was lowered and she has been doing better with those symptoms, but not with the depression.  [name] has a history of atrial fibrillation. She is currently on Eliquis and takes Tikosyn(dofetilide) as an antiarrhythmic and metoprolol.  [name] has a history of migraine headaches. She notes these have been occurring less frequently. She is  on topiramate and metoprolol.  Ms. Horiuchi chart notes she has a history of Stage 3 COPD. However, she is only using a periodic albuterol inhaler, which brings that diagnosis into question. She notes she has not needed her inhaler in quite some time.  [name] has sleep apnea and is currently managed with a CPAP.  [name] had mentioned to the nurse about complaints of low back pain and urinary urgency with occasional incontinence. There was not time today to further evaluate these complaints.  Past Medical History: Patient Active Problem List   Diagnosis Date Noted   Severe recurrent major depression without psychotic features (HCC) 04/30/2020   OSA (obstructive sleep apnea) 07/27/2019   Stage 3 severe COPD by GOLD classification (HCC) 06/24/2019   Fibromyalgia 05/03/2019   Venous insufficiency of both lower extremities 04/29/2019   Visit for monitoring Tikosyn therapy 09/15/2018   Abnormal nuclear stress test    Endometrial polyp    Normocytic anemia 04/24/2014   Migraine 04/22/2014   PAF (paroxysmal atrial fibrillation) (HCC)    GERD (gastroesophageal reflux disease)    Obesity    Past Surgical History:  Procedure Laterality Date   APPENDECTOMY  1990's   COLONOSCOPY     DILATATION & CURETTAGE/HYSTEROSCOPY WITH MYOSURE N/A 04/14/2017   Procedure: DILATATION & CURETTAGE/HYSTEROSCOPY WITH MYOSURE;  Surgeon: Myna Hidalgo, DO;  Location: [facility];  Service: Gynecology;  Laterality: N/A;  Polypectomy   DILATION AND CURETTAGE OF UTERUS  1983   mab   INNER  EAR SURGERY Right 2000's   LEFT HEART CATH AND CORONARY ANGIOGRAPHY N/A 04/17/2017   Procedure: Left Heart Cath and Coronary Angiography;  Surgeon: Corky Crafts, MD;  Location: [facility] INVASIVE CV LAB;  Service: Cardiovascular;  Laterality: N/A;   PLANTAR FASCIA RELEASE Right 07/2015   TUBAL LIGATION  1985   ULNAR NERVE TRANSPOSITION Right    Family History  Problem Relation Age of Onset   Lung cancer Father    Alcohol  abuse Father    Kidney failure Mother        s/p renal Tx   Congestive Heart Failure Mother    Atrial fibrillation Brother    Sudden Cardiac Death Daughter        cardiac arrest   Cardiomyopathy Daughter    Evelene Croon Parkinson White syndrome Grandchild         granddaughter   Outpatient Medications Prior to Visit  Medication Sig Dispense Refill   albuterol (VENTOLIN HFA) 108 (90 Base) MCG/ACT inhaler Inhale 2 puffs into the lungs every 6 (six) hours as needed for wheezing or shortness of breath.     apixaban (ELIQUIS) 5 MG TABS tablet Take 1 tablet (5 mg total) by mouth 2 (two) times daily. 180 tablet 2   divalproex (DEPAKOTE) 125 MG DR tablet SMARTSIG:1 Tablet(s) By Mouth Every Evening     dofetilide (TIKOSYN) 500 MCG capsule TAKE 1 CAPSULE(500 MCG) BY MOUTH TWICE DAILY 180 capsule 0   DULoxetine (CYMBALTA) 60 MG capsule Take 120 mg by mouth daily.     metoprolol tartrate (LOPRESSOR) 50 MG tablet TAKE 1 TABLET TWICE A DAY 180 tablet 2   topiramate (TOPAMAX) 50 MG tablet Take 50 mg by mouth 2 (two) times daily.     cyclobenzaprine (FLEXERIL) 10 MG tablet Take 1 tablet (10 mg total) by mouth 3 (three) times daily as needed for muscle spasms. 30 tablet 0   LATUDA 80 MG TABS tablet Take 40 mg by mouth every evening.     diltiazem (CARDIZEM) 30 MG tablet Take 1 tablet every 4 hours AS NEEDED for AFIB heart rate >100 (Patient not taking: Reported on 04/27/2021) 45 tablet 3   LORazepam (ATIVAN) 0.5 MG tablet Take 1 tablet (0.5 mg total) by mouth at bedtime as needed for anxiety or sleep. (Patient not taking: Reported on 04/27/2021) 30 tablet 1   Lurasidone HCl 60 MG TABS Take 1 tablet by mouth daily.     No facility-administered medications prior to visit.   Allergies  Allergen Reactions   Codeine Nausea And Vomiting, Other (See Comments) and Nausea Only   Objective:   Today's Vitals   04/27/21 1313  BP: 122/80  Pulse: 73  Temp: (!) 96.9 F (36.1 C)  TempSrc: Temporal  SpO2: 97%   Weight: 293 lb (132.9 kg)  Height: 5\' 8"  (1.727 m)   Body mass index is 44.55 kg/m.   General: Well developed, well nourished. No acute distress. Psych: Alert and oriented. Mildly depressed mood and mildly flat affect.  Health Maintenance Due  Topic Date Due   Pneumococcal Vaccine 65-60 Years old (1 - PCV) Never done   Hepatitis C Screening  Never done   TETANUS/TDAP  Never done   Zoster Vaccines- Shingrix (1 of 2) Never done   PAP SMEAR-Modifier  12/21/2018   COVID-19 Vaccine (3 - Moderna risk series) 07/31/2020   MAMMOGRAM  09/24/2020   Lab results: Lab Results  Component Value Date   CHOL 175 04/30/2020   HDL 40 (L) 04/30/2020  LDLCALC 112 (H) 04/30/2020   TRIG 114 04/30/2020   CHOLHDL 4.4 04/30/2020   Lab Results  Component Value Date   HGBA1C 5.4 04/30/2020   Lab Results  Component Value Date   TSH 1.015 04/30/2020   Imaging: Echocardiogram (06/15/2020) IMPRESSIONS    1. Left ventricular ejection fraction, by estimation, is 55 to 60%. The left ventricle has normal function. The left ventricle has no regional wall motion abnormalities. Left ventricular diastolic parameters were normal.   2. Right ventricular systolic function is normal. The right ventricular size is normal. Tricuspid regurgitation signal is inadequate for assessing PA pressure.   3. The mitral valve is normal in structure. Trivial mitral valve regurgitation. No evidence of mitral stenosis.   4. The aortic valve is tricuspid. Aortic valve regurgitation is not visualized. No aortic stenosis is present.     Assessment & Plan:   1. PAF (paroxysmal atrial fibrillation) (HCC) Stable on metoprolol, dofetilide, and Eliquis.  2. Chronic migraine without aura without status migrainosus, not intractable Likely improved with use of metoprolol and Topomax.  3. Stage 3 severe COPD by GOLD classification (HCC) I question this diagnosis as she is not having pulmonary symptoms and is only on an intermittent  SABA. She has had no tobacco use in about 30 years.  4. OSA (obstructive sleep apnea) Uses CPAP.  5. Severe recurrent major depression without psychotic features (HCC) Under psychiatric care. This appears to be doing worse more recently.   6. Encounter for screening mammogram for malignant neoplasm of breast  - MM DIGITAL SCREENING BILATERAL; Future  7. Need for Tdap vaccination  - Tdap vaccine greater than or equal to 7yo IM  8. Need for shingles vaccine  - Varicella-zoster vaccine IM (Shingrix)  Patient to return for pap smear and further evaluation of back pain and incontinence.  Loyola Mast, MD

## 2021-05-18 ENCOUNTER — Other Ambulatory Visit: Payer: Self-pay

## 2021-05-18 ENCOUNTER — Ambulatory Visit: Payer: BC Managed Care – PPO | Admitting: Family Medicine

## 2021-05-18 ENCOUNTER — Other Ambulatory Visit (HOSPITAL_COMMUNITY)
Admission: RE | Admit: 2021-05-18 | Discharge: 2021-05-18 | Disposition: A | Discharge status: Home / Self Care | Payer: BC Managed Care – PPO | Source: Ambulatory Visit | Attending: Diagnostic Radiology | Admitting: Diagnostic Radiology

## 2021-05-18 ENCOUNTER — Encounter: Payer: Self-pay | Admitting: Family Medicine

## 2021-05-18 VITALS — BP 116/74 | HR 91 | Temp 97.6°F | Ht 68.0 in | Wt 298.2 lb

## 2021-05-18 DIAGNOSIS — N3946 Mixed incontinence: Secondary | ICD-10-CM

## 2021-05-18 DIAGNOSIS — G8929 Other chronic pain: Secondary | ICD-10-CM | POA: Diagnosis not present

## 2021-05-18 DIAGNOSIS — Z124 Encounter for screening for malignant neoplasm of cervix: Secondary | ICD-10-CM

## 2021-05-18 DIAGNOSIS — M545 Low back pain, unspecified: Secondary | ICD-10-CM | POA: Diagnosis not present

## 2021-05-18 LAB — URINALYSIS, ROUTINE W REFLEX MICROSCOPIC
Bilirubin Urine: NEGATIVE
Hgb urine dipstick: NEGATIVE
Ketones, ur: NEGATIVE
Leukocytes,Ua: NEGATIVE
Nitrite: NEGATIVE
Specific Gravity, Urine: 1.015 (ref 1.000–1.030)
Total Protein, Urine: NEGATIVE
Urine Glucose: NEGATIVE
Urobilinogen, UA: 1 (ref 0.0–1.0)
pH: 6.5 (ref 5.0–8.0)

## 2021-05-18 NOTE — Progress Notes (Signed)
[facility] [address] [place] [address] Dept: [phone] Dept Fax: [phone]  Office Visit  Subjective:    Patient ID: Regina Morales, female    DOB: 1961-08-26, 60 y.o..   MRN: [id number]  Chief Complaint  Patient presents with   Follow-up    F/u for pap only.     History of Present Illness:  Patient is in today for completion of a pap smear. Additionally, she had two issues that were raised during her last appointment that we did not have time to address.  [name] notes for several months she has been experiencing urinary incontinence. She notes that at times she has an urge to urinate and cannot get to the bathroom in time. This is esp. a problem first thing in the morning. Sh admits her urine looks dark at times and has an odor to it. She also does have episodes of incontinence when she sneezes, coughs, or laughs. She has had 4 prior vaginal deliveries.  [name] also notes a 2-3 year history of low back pain issues. She notes that she had a fall down a set of stairs about 3 years ago. She had a 2 week hospitalization after this and subsequently a 4 month stay in a nursing home. Since that time, she has had to use a wheelchair and has has quite a bit of difficulty climbing stairs. She notes significant pain with bending over.  Past Medical History: Patient Active Problem List   Diagnosis Date Noted   Severe recurrent major depression without psychotic features (HCC) 04/30/2020   OSA (obstructive sleep apnea) 07/27/2019   Stage 3 severe COPD by GOLD classification (HCC) 06/24/2019   Fibromyalgia 05/03/2019   Venous insufficiency of both lower extremities 04/29/2019   Visit for monitoring Tikosyn therapy 09/15/2018   Endometrial polyp    Normocytic anemia 04/24/2014   Migraine 04/22/2014   PAF (paroxysmal atrial fibrillation) (HCC)    GERD (gastroesophageal reflux disease)    Obesity    Past Surgical  History:  Procedure Laterality Date   APPENDECTOMY  1990's   COLONOSCOPY     DILATATION & CURETTAGE/HYSTEROSCOPY WITH MYOSURE N/A 04/14/2017   Procedure: DILATATION & CURETTAGE/HYSTEROSCOPY WITH MYOSURE;  Surgeon: Myna Hidalgo, DO;  Location: [facility];  Service: Gynecology;  Laterality: N/A;  Polypectomy   DILATION AND CURETTAGE OF UTERUS  1983   mab   INNER EAR SURGERY Right 2000's   LEFT HEART CATH AND CORONARY ANGIOGRAPHY N/A 04/17/2017   Procedure: Left Heart Cath and Coronary Angiography;  Surgeon: Corky Crafts, MD;  Location[facility] INVASIVE CV LAB;  Service: Cardiovascular;  Laterality: N/A;   PLANTAR FASCIA RELEASE Right 07/2015   TUBAL LIGATION  1985   ULNAR NERVE TRANSPOSITION Right    Family History  Problem Relation Age of Onset   Lung cancer Father    Alcohol abuse Father    Kidney failure Mother        s/p renal Tx   Congestive Heart Failure Mother    Atrial fibrillation Brother    Sudden Cardiac Death Daughter        cardiac arrest   Cardiomyopathy Daughter    Evelene Croon Parkinson White syndrome Grandchild         granddaughter   Outpatient Medications Prior to Visit  Medication Sig Dispense Refill   albuterol (VENTOLIN HFA) 108 (90 Base) MCG/ACT inhaler Inhale 2 puffs into the lungs every 6 (six) hours as needed for wheezing or  shortness of breath.     apixaban (ELIQUIS) 5 MG TABS tablet Take 1 tablet (5 mg total) by mouth 2 (two) times daily. 180 tablet 2   diltiazem (CARDIZEM) 30 MG tablet Take 30 mg by mouth 4 (four) times daily. 1 tablet every 4 hours as needed     divalproex (DEPAKOTE ER) 500 MG 24 hr tablet SMARTSIG:2 Tablet(s) By Mouth Every Evening     dofetilide (TIKOSYN) 500 MCG capsule TAKE 1 CAPSULE(500 MCG) BY MOUTH TWICE DAILY 180 capsule 0   DULoxetine (CYMBALTA) 60 MG capsule Take 120 mg by mouth daily.     lurasidone (LATUDA) 80 MG TABS tablet Take 80 mg by mouth daily with breakfast.     metoprolol tartrate (LOPRESSOR) 50 MG tablet TAKE 1 TABLET  TWICE A DAY 180 tablet 2   rizatriptan (MAXALT) 10 MG tablet Take 10 mg by mouth as needed for migraine. May repeat in 2 hours if needed     topiramate (TOPAMAX) 50 MG tablet Take 50 mg by mouth 2 (two) times daily.     divalproex (DEPAKOTE) 125 MG DR tablet SMARTSIG:1 Tablet(s) By Mouth Every Evening (Patient not taking: Reported on 05/18/2021)     No facility-administered medications prior to visit.   Allergies  Allergen Reactions   Codeine Nausea And Vomiting, Other (See Comments) and Nausea Only   Objective:   Today's Vitals   05/18/21 1407  BP: 116/74  Pulse: 91  Temp: 97.6 F (36.4 C)  TempSrc: Temporal  SpO2: 99%  Weight: 298 lb 3.2 oz (135.3 kg)  Height: 5\' 8"  (1.727 m)   Body mass index is 45.34 kg/m.   General: Well developed, well nourished. No acute distress. GU: Normal external genitalia. There is mild redness around the urethral orifice. The vaginal mucosa is pink.   There is scant creamy white discharge in the vaginal vault. The cervix appears normal There is no CMT.  Uterus is normal size. No adnexal masses. No prolapse noted. Psych: Alert and oriented. Normal mood and affect.  Health Maintenance Due  Topic Date Due   Pneumococcal Vaccine 37-37 Years old (1 - PCV) Never done   Hepatitis C Screening  Never done   PAP SMEAR-Modifier  12/21/2018   COVID-19 Vaccine (3 - Moderna risk series) 07/31/2020   MAMMOGRAM  09/24/2020     Imaging: Lumbar spine x-ray (04/05/2019) IMPRESSION: Degenerative change as described. Disc space narrowing L4-5. Trace anterolisthesis L3-L4. Lower lumbar facet arthropathy.  Assessment & Plan:   1. Screening for cervical cancer Pap smear completed today.  - Cytology - PAP  2. Mixed incontinence urge and stress Ms. 06/05/2019 has mixed urge and stress incontinence. I will check a UA to rule-out UTI. I suspect this will be normal, so will go ahead and refer her to urology for assessment and a management plan.  - Urinalysis, Routine w  reflex microscopic - Ambulatory referral to Urology  3. Chronic bilateral low back pain, unspecified whether sciatica present [name] relates that she had a prior MRI at [facility]. I asked her to sign a release to obtain the report from that scan. I will have her return in 3 weeks to review the results and discuss a plan for further evaluation of her back pain and limited mobility.  [facility], MD

## 2021-05-25 LAB — CYTOLOGY - PAP
Adequacy: ABSENT
Comment: NEGATIVE
Comment: NEGATIVE
Diagnosis: NEGATIVE
HPV 16: NEGATIVE
HPV 18 / 45: NEGATIVE
High risk HPV: POSITIVE — AB

## 2021-05-27 ENCOUNTER — Other Ambulatory Visit: Payer: Self-pay | Admitting: Cardiology

## 2021-05-28 ENCOUNTER — Encounter: Payer: Self-pay | Admitting: Family Medicine

## 2021-05-28 DIAGNOSIS — M48061 Spinal stenosis, lumbar region without neurogenic claudication: Secondary | ICD-10-CM | POA: Insufficient documentation

## 2021-05-28 DIAGNOSIS — M51369 Other intervertebral disc degeneration, lumbar region without mention of lumbar back pain or lower extremity pain: Secondary | ICD-10-CM | POA: Insufficient documentation

## 2021-05-28 DIAGNOSIS — M5136 Other intervertebral disc degeneration, lumbar region: Secondary | ICD-10-CM | POA: Insufficient documentation

## 2021-05-31 ENCOUNTER — Other Ambulatory Visit: Payer: Self-pay | Admitting: Cardiology

## 2021-06-02 ENCOUNTER — Emergency Department (HOSPITAL_COMMUNITY): Payer: BC Managed Care – PPO

## 2021-06-02 ENCOUNTER — Encounter (HOSPITAL_COMMUNITY): Payer: Self-pay | Admitting: Emergency Medicine

## 2021-06-02 ENCOUNTER — Other Ambulatory Visit: Payer: Self-pay

## 2021-06-02 ENCOUNTER — Emergency Department (HOSPITAL_COMMUNITY)
Admission: EM | Admit: 2021-06-02 | Discharge: 2021-06-02 | Disposition: A | Discharge status: Home / Self Care | Payer: BC Managed Care – PPO | Attending: Emergency Medicine | Admitting: Emergency Medicine

## 2021-06-02 DIAGNOSIS — Z79899 Other long term (current) drug therapy: Secondary | ICD-10-CM | POA: Insufficient documentation

## 2021-06-02 DIAGNOSIS — K5792 Diverticulitis of intestine, part unspecified, without perforation or abscess without bleeding: Secondary | ICD-10-CM

## 2021-06-02 DIAGNOSIS — Z7901 Long term (current) use of anticoagulants: Secondary | ICD-10-CM | POA: Insufficient documentation

## 2021-06-02 DIAGNOSIS — R1032 Left lower quadrant pain: Secondary | ICD-10-CM | POA: Diagnosis present

## 2021-06-02 DIAGNOSIS — K5732 Diverticulitis of large intestine without perforation or abscess without bleeding: Secondary | ICD-10-CM | POA: Insufficient documentation

## 2021-06-02 DIAGNOSIS — I10 Essential (primary) hypertension: Secondary | ICD-10-CM | POA: Insufficient documentation

## 2021-06-02 DIAGNOSIS — J449 Chronic obstructive pulmonary disease, unspecified: Secondary | ICD-10-CM | POA: Diagnosis not present

## 2021-06-02 DIAGNOSIS — Z87891 Personal history of nicotine dependence: Secondary | ICD-10-CM | POA: Insufficient documentation

## 2021-06-02 LAB — CBC WITH DIFFERENTIAL/PLATELET
Abs Immature Granulocytes: 0.04 10*3/uL (ref 0.00–0.07)
Basophils Absolute: 0.1 10*3/uL (ref 0.0–0.1)
Basophils Relative: 1 %
Eosinophils Absolute: 0.2 10*3/uL (ref 0.0–0.5)
Eosinophils Relative: 3 %
HCT: 35.7 % — ABNORMAL LOW (ref 36.0–46.0)
Hemoglobin: 11.9 g/dL — ABNORMAL LOW (ref 12.0–15.0)
Immature Granulocytes: 1 %
Lymphocytes Relative: 30 %
Lymphs Abs: 2.1 10*3/uL (ref 0.7–4.0)
MCH: 29.9 pg (ref 26.0–34.0)
MCHC: 33.3 g/dL (ref 30.0–36.0)
MCV: 89.7 fL (ref 80.0–100.0)
Monocytes Absolute: 0.5 10*3/uL (ref 0.1–1.0)
Monocytes Relative: 7 %
Neutro Abs: 4.1 10*3/uL (ref 1.7–7.7)
Neutrophils Relative %: 58 %
Platelets: 135 10*3/uL — ABNORMAL LOW (ref 150–400)
RBC: 3.98 MIL/uL (ref 3.87–5.11)
RDW: 12.1 % (ref 11.5–15.5)
WBC: 7 10*3/uL (ref 4.0–10.5)
nRBC: 0 % (ref 0.0–0.2)

## 2021-06-02 LAB — COMPREHENSIVE METABOLIC PANEL
ALT: 19 U/L (ref 0–44)
AST: 25 U/L (ref 15–41)
Albumin: 3.4 g/dL — ABNORMAL LOW (ref 3.5–5.0)
Alkaline Phosphatase: 62 U/L (ref 38–126)
Anion gap: 8 (ref 5–15)
BUN: 16 mg/dL (ref 6–20)
CO2: 24 mmol/L (ref 22–32)
Calcium: 8.8 mg/dL — ABNORMAL LOW (ref 8.9–10.3)
Chloride: 103 mmol/L (ref 98–111)
Creatinine, Ser: 0.79 mg/dL (ref 0.44–1.00)
GFR, Estimated: >60 mL/min (ref >60)
Glucose, Bld: 94 mg/dL (ref 70–99)
Potassium: 3.8 mmol/L (ref 3.5–5.1)
Sodium: 135 mmol/L (ref 135–145)
Total Bilirubin: 0.9 mg/dL (ref 0.3–1.2)
Total Protein: 6.7 g/dL (ref 6.5–8.1)

## 2021-06-02 LAB — URINALYSIS, ROUTINE W REFLEX MICROSCOPIC
Bilirubin Urine: NEGATIVE
Glucose, UA: NEGATIVE mg/dL
Hgb urine dipstick: NEGATIVE
Ketones, ur: NEGATIVE mg/dL
Leukocytes,Ua: NEGATIVE
Nitrite: NEGATIVE
Protein, ur: NEGATIVE mg/dL
Specific Gravity, Urine: 1.013 (ref 1.005–1.030)
pH: 6 (ref 5.0–8.0)

## 2021-06-02 LAB — LIPASE, BLOOD: Lipase: 41 U/L (ref 11–51)

## 2021-06-02 MED ORDER — HYDROMORPHONE HCL 1 MG/ML IJ SOLN
0.5000 mg | Freq: Once | INTRAMUSCULAR | Status: AC
Start: 2021-06-02 — End: 2021-06-02
  Administered 2021-06-02: 0.5 mg via INTRAVENOUS
  Filled 2021-06-02: qty 1

## 2021-06-02 MED ORDER — AMOXICILLIN-POT CLAVULANATE 875-125 MG PO TABS: 1.0000 | ORAL_TABLET | Freq: Two times a day (BID) | ORAL | 0 refills | Status: DC

## 2021-06-02 MED ORDER — IOHEXOL 300 MG/ML  SOLN
100.0000 mL | Freq: Once | INTRAMUSCULAR | Status: AC | PRN
  Administered 2021-06-02: 100 mL via INTRAVENOUS

## 2021-06-02 MED ORDER — ONDANSETRON 4 MG PO TBDP: 4.0000 mg | ORAL_TABLET | Freq: Three times a day (TID) | ORAL | 0 refills | Status: DC | PRN

## 2021-06-02 MED ORDER — MORPHINE SULFATE (PF) 4 MG/ML IV SOLN
4.0000 mg | Freq: Once | INTRAVENOUS | Status: AC
  Administered 2021-06-02: 4 mg via INTRAVENOUS
  Filled 2021-06-02: qty 1

## 2021-06-02 MED ORDER — HYDROCODONE-ACETAMINOPHEN 5-325 MG PO TABS: 1.0000 | ORAL_TABLET | Freq: Four times a day (QID) | ORAL | 0 refills | Status: DC | PRN

## 2021-06-02 MED ORDER — SODIUM CHLORIDE 0.9 % IV BOLUS
500.0000 mL | Freq: Once | INTRAVENOUS | Status: AC
  Administered 2021-06-02: 500 mL via INTRAVENOUS

## 2021-06-02 MED ORDER — ONDANSETRON HCL 4 MG/2ML IJ SOLN
4.0000 mg | Freq: Once | INTRAMUSCULAR | Status: AC
  Administered 2021-06-02: 4 mg via INTRAVENOUS
  Filled 2021-06-02: qty 2

## 2021-06-02 MED ORDER — AMOXICILLIN-POT CLAVULANATE 875-125 MG PO TABS
1.0000 | ORAL_TABLET | Freq: Once | ORAL | Status: AC
  Administered 2021-06-02: 1 via ORAL
  Filled 2021-06-02: qty 1

## 2021-06-02 NOTE — ED Notes (Signed)
Patient transported to CT 

## 2021-06-02 NOTE — ED Provider Notes (Signed)
Emergency Medicine Provider Triage Evaluation Note  Regina Morales , a 60 y.o. female  was evaluated in triage.  Pt complains of abd pain x 3 days. Diffuse worse to LLQ. Mild radiation into left lower back. No hx of diverticulosis/itis, AAA, dissection, kidney stones. Unsure last BM however not diarrhea. Passing flatus.No urinary complaints. No fever, emesis, melena, BRBPR,  Review of Systems  Positive: Abd pain Negative: Emesis, fever, dysuria, diarrhea  Physical Exam  There were no vitals taken for this visit. Gen:   Awake, no distress   Resp:  Normal effort  Heart:  2+ radial pulses bilaterally MSK:   Moves extremities without difficulty, neg CVA tap ABD:  Diffuse abd tenderness, worse on LLQ Other:    Medical Decision Making  Medically screening exam initiated at 10:35 AM.  Appropriate orders placed.  Regina Morales was informed that the remainder of the evaluation will be completed by another provider, this initial triage assessment does not replace that evaluation, and the importance of remaining in the ED until their evaluation is complete.  ABD pain.  Work up started   [employer], PA-C 06/02/21 1038    Cathren Laine, MD 06/02/21 1302

## 2021-06-02 NOTE — Discharge Instructions (Addendum)
You were seen in the emergency department today for abdominal pain.  Your CT scan showed findings of diverticulitis, please see attached handout.  Your labs show that you have mild anemia and your platelets are mildly low, have these rechecked by your primary care provider.  We are sending you home with the following medicines: - Augmentin: This is an antibiotic to take twice per day for the next 1 week.  You were given your first dose in the emergency department. - Zofran: Take every hours as needed for nausea and vomiting -Percocet-this is a narcotic/controlled substance medication that has potential addicting qualities.  We recommend that you take 1-2 tablets every 6 hours as needed for severe pain.  Do not drive or operate heavy machinery when taking this medicine as it can be sedating. Do not drink alcohol or take other sedating medications when taking this medicine for safety reasons.  Keep this out of reach of small children.  Please be aware this medicine has Tylenol in it (325 mg/tab) do not exceed the maximum dose of Tylenol in a day per over the counter recommendations should you decide to supplement with Tylenol over the counter.   We have prescribed you new medication(s) today. Discuss the medications prescribed today with your pharmacist as they can have adverse effects and interactions with your other medicines including over the counter and prescribed medications. Seek medical evaluation if you start to experience new or abnormal symptoms after taking one of these medicines, seek care immediately if you start to experience difficulty breathing, feeling of your throat closing, facial swelling, or rash as these could be indications of a more serious allergic reaction  Please follow a clear liquid diet over the next 48 hours, you may slowly advance this if your pain is improving. Please follow-up with your primary care provider within 3 days.  Return to the emergency department for new or  worsening symptoms including but not limited to new or worsening pain, fever, inability to keep fluids down, blood in your stool, passing out, or any other concerns.

## 2021-06-02 NOTE — ED Provider Notes (Signed)
[facility] Provider Note   CSN: [id number] Arrival date & time: 06/02/21  1018     History Chief Complaint  Patient presents with   Abdominal Pain    Regina Morales is a [age] y.o. female with a hx of DVT/PE, persistent afib, chronic anticoagulation on eliquis, hypercholesterolemia, hypertension, GERD, and prior appendectomy & tubal ligation who presents to the ED with complaints of abdominal pain x 2-3 days. Constant pain to the LLQ, worse with laughing and her dog jumping on her, no alleviating factors. Associated nausea & diarrhea. Denies fever, vomiting, melena, hematochezia, dysuria, or vaginal bleeding/discharge. Denies recent foreign travel or abx.   HPI     Past Medical History:  Diagnosis Date   Anemia    hx years ago   Buzzing in ear    "right; even after OR"   Chest pain    a. 2009: neg MV  ([facility])   Depression    DVT (deep vein thrombosis) in pregnancy 1980's   LLE   Fibromyalgia    "dx'd many many years ago; I haven't had any problems w/it" (04/15/2017)   GERD (gastroesophageal reflux disease)    Hepatic steatosis    High cholesterol    History of blood transfusion    "low count after appendix surgery" unsure # of units transfused   Hypertension    Migraines    "maybe once/2 months" (04/15/2017)   Obesity    OSA on CPAP    Persistent atrial fibrillation (HCC)    a. Dx 07/2013;  b. CHA2DS2VASc=1 (female);  c. 07/2013 Echo:  EF 50-55%, no rwma, mod LVH.   Pulmonary embolism (HCC) ~ 1984   "after I had my last child"   SVD (spontaneous vaginal delivery)    x 4    Patient Active Problem List   Diagnosis Date Noted   Lumbar degenerative disc disease 05/28/2021   Mild spinal stenosis at L4-L5 level 05/28/2021   Severe recurrent major depression without psychotic features (HCC) 04/30/2020   OSA (obstructive sleep apnea) 07/27/2019   Stage 3 severe COPD by GOLD classification (HCC) 06/24/2019   Fibromyalgia 05/03/2019    Venous insufficiency of both lower extremities 04/29/2019   Visit for monitoring Tikosyn therapy 09/15/2018   Endometrial polyp    Normocytic anemia 04/24/2014   Migraine 04/22/2014   PAF (paroxysmal atrial fibrillation) (HCC)    GERD (gastroesophageal reflux disease)    Obesity     Past Surgical History:  Procedure Laterality Date   APPENDECTOMY  1990's   COLONOSCOPY     DILATATION & CURETTAGE/HYSTEROSCOPY WITH MYOSURE N/A 04/14/2017   Procedure: DILATATION & CURETTAGE/HYSTEROSCOPY WITH MYOSURE;  Surgeon: Myna Hidalgozan, Jennifer, DO;  Location: [facility];  Service: Gynecology;  Laterality: N/A;  Polypectomy   DILATION AND CURETTAGE OF UTERUS  1983   mab   INNER EAR SURGERY Right 2000's   LEFT HEART CATH AND CORONARY ANGIOGRAPHY N/A 04/17/2017   Procedure: Left Heart Cath and Coronary Angiography;  Surgeon: Corky CraftsVaranasi, Jayadeep S, MD;  [facility];  Service: Cardiovascular;  Laterality: N/A;   PLANTAR FASCIA RELEASE Right 07/2015   TUBAL LIGATION  1985   ULNAR NERVE TRANSPOSITION Right      OB History   No obstetric history on file.     Family History  Problem Relation Age of Onset   Lung cancer Father    Alcohol abuse Father    Kidney failure Mother        s/p  renal Tx   Congestive Heart Failure Mother    Atrial fibrillation Brother    Sudden Cardiac Death Daughter        cardiac arrest   Cardiomyopathy Daughter    Phillips Odor White syndrome Grandchild         granddaughter    Social History   Tobacco Use   Smoking status: Former    Packs/day: 1.00    Years: 8.00    Pack years: 8.00    Types: Cigarettes    Quit date: 11/04/1990    Years since quitting: 30.5   Smokeless tobacco: Never  Vaping Use   Vaping Use: Never used  Substance Use Topics   Alcohol use: Not Currently   Drug use: No    Home Medications Prior to Admission medications   Medication Sig Start Date End Date Taking? Authorizing Provider  albuterol (VENTOLIN HFA) 108 (90 Base)  MCG/ACT inhaler Inhale 2 puffs into the lungs every 6 (six) hours as needed for wheezing or shortness of breath.    [provider]  apixaban (ELIQUIS) 5 MG TABS tablet Take 1 tablet (5 mg total) by mouth 2 (two) times daily. 03/15/21   Camnitz, Andree Coss, MD  diltiazem (CARDIZEM) 30 MG tablet Take 30 mg by mouth 4 (four) times daily. 1 tablet every 4 hours as needed    [provider]  divalproex (DEPAKOTE ER) 500 MG 24 hr tablet SMARTSIG:2 Tablet(s) By Mouth Every Evening 05/04/21   [provider]  dofetilide (TIKOSYN) 500 MCG capsule TAKE 1 CAPSULE(500 MCG) BY MOUTH TWICE DAILY 05/29/21   Camnitz, Andree Coss, MD  DULoxetine (CYMBALTA) 60 MG capsule Take 120 mg by mouth daily.    [provider]  lurasidone (LATUDA) 80 MG TABS tablet Take 80 mg by mouth daily with breakfast.    [provider]  metoprolol tartrate (LOPRESSOR) 50 MG tablet TAKE 1 TABLET TWICE A DAY 05/31/21   Camnitz, Will Daphine Deutscher, MD  rizatriptan (MAXALT) 10 MG tablet Take 10 mg by mouth as needed for migraine. May repeat in 2 hours if needed    [provider]  topiramate (TOPAMAX) 50 MG tablet Take 50 mg by mouth 2 (two) times daily. 08/31/20   [provider]    Allergies    Codeine  Review of Systems   Review of Systems  Constitutional:  Negative for chills and fever.  Respiratory:  Negative for cough.   Cardiovascular:  Negative for chest pain.  Gastrointestinal:  Positive for abdominal pain, diarrhea and nausea. Negative for blood in stool, constipation and vomiting.  Genitourinary:  Negative for dysuria.  Neurological:  Negative for syncope.  All other systems reviewed and are negative.  Physical Exam Updated Vital Signs BP 136/80 (BP Location: Right Arm)   Pulse 69   Temp 98.5 F (36.9 C) (Oral)   Resp 18   SpO2 99%   Physical Exam Vitals and nursing note reviewed.  Constitutional:      General: She is not in acute distress.    Appearance:  She is well-developed. She is not toxic-appearing.  HENT:     Head: Normocephalic and atraumatic.  Eyes:     General:        Right eye: No discharge.        Left eye: No discharge.     Conjunctiva/sclera: Conjunctivae normal.  Cardiovascular:     Rate and Rhythm: Normal rate and regular rhythm.  Pulmonary:     Effort: Pulmonary effort is  normal. No respiratory distress.     Breath sounds: Normal breath sounds. No wheezing, rhonchi or rales.  Abdominal:     General: There is no distension.     Palpations: Abdomen is soft.     Tenderness: There is abdominal tenderness (mild generalized- most prominent in the LLQ). There is no guarding or rebound.  Musculoskeletal:     Cervical back: Neck supple.  Skin:    General: Skin is warm and dry.     Findings: No rash.  Neurological:     Mental Status: She is alert.     Comments: Clear speech.   Psychiatric:        Behavior: Behavior normal.    ED Results / Procedures / Treatments   Labs (all labs ordered are listed, but only abnormal results are displayed) Labs Reviewed  CBC WITH DIFFERENTIAL/PLATELET - Abnormal; Notable for the following components:      Result Value   Hemoglobin 11.9 (*)    HCT 35.7 (*)    Platelets 135 (*)    All other components within normal limits  COMPREHENSIVE METABOLIC PANEL - Abnormal; Notable for the following components:   Calcium 8.8 (*)    Albumin 3.4 (*)    All other components within normal limits  URINALYSIS, ROUTINE W REFLEX MICROSCOPIC - Abnormal; Notable for the following components:   APPearance HAZY (*)    All other components within normal limits  LIPASE, BLOOD    EKG None  Radiology CT Abdomen Pelvis W Contrast  Result Date: 06/02/2021 CLINICAL DATA:  Patient with 3 days of left lower quadrant abdominal pain. EXAM: CT ABDOMEN AND PELVIS WITH CONTRAST TECHNIQUE: Multidetector CT imaging of the abdomen and pelvis was performed using the standard protocol following bolus administration  of intravenous contrast. CONTRAST:  OMNIPAQUE IOHEXOL 300 MG/ML  SOLN COMPARISON:  None. FINDINGS: Lower chest: Normal heart size. Scattered atelectasis. No pleural effusion. Hepatobiliary: Liver is normal in size and contour. No focal hepatic lesion identified. Multiple gallstones. No intrahepatic or extrahepatic biliary ductal dilatation. Pancreas: Unremarkable Spleen: Spleen is mildly enlarged measuring 14 cm. Adrenals/Urinary Tract: Normal adrenal glands. Kidneys enhance symmetrically with contrast. No hydronephrosis. Urinary bladder is unremarkable. Stomach/Bowel: Mild fat stranding about the descending/sigmoid colon (image 71; series 3). No evidence for bowel obstruction. No free intraperitoneal air. Normal morphology of the stomach. Vascular/Lymphatic: Normal caliber abdominal aorta. Peripheral calcified atherosclerotic plaque. No retroperitoneal lymphadenopathy. Reproductive: Uterus and adnexal structures are unremarkable. Other: None. Musculoskeletal: Lumbar spine degenerative changes. No aggressive or acute appearing osseous lesions. IMPRESSION: Findings suggestive of mild sigmoid colonic diverticulitis. Electronically Signed   By: Annia Belt M.D.   On: 06/02/2021 14:28    Procedures Procedures   Medications Ordered in ED Medications  sodium chloride 0.9 % bolus 500 mL (500 mLs Intravenous New Bag/Given 06/02/21 1307)  ondansetron (ZOFRAN) injection 4 mg (4 mg Intravenous Given 06/02/21 1309)  morphine 4 MG/ML injection 4 mg (4 mg Intravenous Given 06/02/21 1308)    ED Course  I have reviewed the triage vital signs and the nursing notes.  Pertinent labs & imaging results that were available during my care of the patient were reviewed by me and considered in my medical decision making (see chart for details).    MDM Rules/Calculators/A&P                           Patient presents to the ED with complaints of abdominal pain. Patient nontoxic  appearing, in no apparent distress,  vitals without significant abnormality. On exam patient tender to the generalized abdomen- most prominent in the LLQ, no peritoneal signs. Will evaluate with labs and CT A/P. Analgesics, anti-emetics, and fluids administered.   Additional history obtained:  Additional history obtained from chart review & nursing note review.   Lab Tests:  I reviewed and interpreted labs, which included:  CBC: Mild anemia & thrombocytopenia. Anemia is similar to prior.  CMP: fairly unremarkable, mild hypoalbuminemia/calcemia Lipase: WNL UA: No UTI  Imaging Studies ordered:  CT A/P ordered I independently reviewed, formal radiology impression shows:  Findings suggestive of mild sigmoid colonic diverticulitis  ED Course:  Suspect patient is symptomatic from her mild sigmoid colonic diverticulitis.  No abscess or perforation.  No leukocytosis or fever, patient does not appear toxic or septic. She is tolerating PO and feeling improved in the ED. Will discharge home with supportive measures, Augmentin, and instructions for clear liquid diet.. I discussed results, treatment plan, need for PCP follow-up, and return precautions with the patient. Provided opportunity for questions, patient confirmed understanding and is in agreement with plan.    Portions of this note were generated with Scientist, clinical (histocompatibility and immunogenetics). Dictation errors may occur despite best attempts at proofreading.   Final Clinical Impression(s) / ED Diagnoses Final diagnoses:  Diverticulitis    Rx / DC Orders ED Discharge Orders          Ordered    amoxicillin-clavulanate (AUGMENTIN) 875-125 MG tablet  Every 12 hours        06/02/21 1519    ondansetron (ZOFRAN ODT) 4 MG disintegrating tablet  Every 8 hours PRN        06/02/21 1519    HYDROcodone-acetaminophen (NORCO/VICODIN) 5-325 MG tablet  Every 6 hours PRN        06/02/21 1519             Permelia Bamba, [place] R, PA-C 06/02/21 1520    Alvira Monday, MD 06/02/21 2340

## 2021-06-02 NOTE — ED Triage Notes (Signed)
C/o intermittent lower abd pain x 3 days.  Left side worse than right.  Denies nausea, vomiting, diarrhea, constipation, or urinary complaints.

## 2021-06-08 ENCOUNTER — Other Ambulatory Visit: Payer: Self-pay

## 2021-06-08 ENCOUNTER — Ambulatory Visit: Payer: BC Managed Care – PPO | Admitting: Family Medicine

## 2021-06-08 VITALS — BP 124/82 | HR 76 | Temp 97.0°F | Ht 68.0 in | Wt 289.6 lb

## 2021-06-08 DIAGNOSIS — M5136 Other intervertebral disc degeneration, lumbar region: Secondary | ICD-10-CM | POA: Diagnosis not present

## 2021-06-08 DIAGNOSIS — K5792 Diverticulitis of intestine, part unspecified, without perforation or abscess without bleeding: Secondary | ICD-10-CM

## 2021-06-08 DIAGNOSIS — M51369 Other intervertebral disc degeneration, lumbar region without mention of lumbar back pain or lower extremity pain: Secondary | ICD-10-CM

## 2021-06-08 NOTE — Progress Notes (Signed)
[facility] [address] [place] [address] Dept: [phone] Dept Fax: [phone]  Office Visit  Subjective:    Patient ID: Regina Morales, female    DOB: May 10, 1961, 60 y.o..   MRN: [id number]  Chief Complaint  Patient presents with   Follow-up    3 week f/u . Also went to ER on 7/31 and was diagnosed w/ Diverticulitis.     History of Present Illness:  Patient is in today for reassessment of her chronic low back pain with sciatica. She had signed for release of an MRI report from 2020, which we have received. [name] completed a course of physical therapy 2 years ago, which did not improve her pain. She had a prior spinal injection, which also did not improve her pain. She continues to struggle with daily pain and it is limiting for activities like climbing stairs.  [name] was seen at [facility] on 7/30 with acute abdominal pain. She was found to have diverticulitis. She is currently on Augmentin for this. She notes her symptoms are improving, but not resolved.  Past Medical History: Patient Active Problem List   Diagnosis Date Noted   Diverticulitis 06/08/2021   Lumbar degenerative disc disease 05/28/2021   Mild spinal stenosis at L4-L5 level 05/28/2021   Severe recurrent major depression without psychotic features (HCC) 04/30/2020   OSA (obstructive sleep apnea) 07/27/2019   Stage 3 severe COPD by GOLD classification (HCC) 06/24/2019   Fibromyalgia 05/03/2019   Venous insufficiency of both lower extremities 04/29/2019   Visit for monitoring Tikosyn therapy 09/15/2018   Endometrial polyp    Normocytic anemia 04/24/2014   Migraine 04/22/2014   PAF (paroxysmal atrial fibrillation) (HCC)    GERD (gastroesophageal reflux disease)    Obesity    Past Surgical History:  Procedure Laterality Date   APPENDECTOMY  1990's   COLONOSCOPY     DILATATION & CURETTAGE/HYSTEROSCOPY WITH MYOSURE N/A 04/14/2017    Procedure: DILATATION & CURETTAGE/HYSTEROSCOPY WITH MYOSURE;  Surgeon: Myna Hidalgo, DO;  Location: [facility];  Service: Gynecology;  Laterality: N/A;  Polypectomy   DILATION AND CURETTAGE OF UTERUS  1983   mab   INNER EAR SURGERY Right 2000's   LEFT HEART CATH AND CORONARY ANGIOGRAPHY N/A 04/17/2017   Procedure: Left Heart Cath and Coronary Angiography;  Surgeon: Corky Crafts, MD;  Location: [facility] INVASIVE CV LAB;  Service: Cardiovascular;  Laterality: N/A;   PLANTAR FASCIA RELEASE Right 07/2015   TUBAL LIGATION  1985   ULNAR NERVE TRANSPOSITION Right    Family History  Problem Relation Age of Onset   Lung cancer Father    Alcohol abuse Father    Kidney failure Mother        s/p renal Tx   Congestive Heart Failure Mother    Atrial fibrillation Brother    Sudden Cardiac Death Daughter        cardiac arrest   Cardiomyopathy Daughter    Evelene Croon Parkinson White syndrome Grandchild         granddaughter   Outpatient Medications Prior to Visit  Medication Sig Dispense Refill   albuterol (VENTOLIN HFA) 108 (90 Base) MCG/ACT inhaler Inhale 2 puffs into the lungs every 6 (six) hours as needed for wheezing or shortness of breath.     amoxicillin-clavulanate (AUGMENTIN) 875-125 MG tablet Take 1 tablet by mouth every 12 (twelve) hours. 14 tablet 0   apixaban (ELIQUIS) 5 MG TABS tablet Take 1 tablet (5 mg total) by mouth  2 (two) times daily. 180 tablet 2   diltiazem (CARDIZEM) 30 MG tablet Take 30 mg by mouth 4 (four) times daily. 1 tablet every 4 hours as needed     divalproex (DEPAKOTE ER) 500 MG 24 hr tablet SMARTSIG:2 Tablet(s) By Mouth Every Evening     dofetilide (TIKOSYN) 500 MCG capsule TAKE 1 CAPSULE(500 MCG) BY MOUTH TWICE DAILY 180 capsule 0   DULoxetine (CYMBALTA) 60 MG capsule Take 120 mg by mouth daily.     HYDROcodone-acetaminophen (NORCO/VICODIN) 5-325 MG tablet Take 1-2 tablets by mouth every 6 (six) hours as needed. 8 tablet 0   lurasidone (LATUDA) 80 MG TABS tablet Take 80  mg by mouth daily with breakfast.     metoprolol tartrate (LOPRESSOR) 50 MG tablet TAKE 1 TABLET TWICE A DAY 180 tablet 3   ondansetron (ZOFRAN ODT) 4 MG disintegrating tablet Take 1 tablet (4 mg total) by mouth every 8 (eight) hours as needed for nausea or vomiting. 10 tablet 0   rizatriptan (MAXALT) 10 MG tablet Take 10 mg by mouth as needed for migraine. May repeat in 2 hours if needed     topiramate (TOPAMAX) 50 MG tablet Take 50 mg by mouth 2 (two) times daily.     No facility-administered medications prior to visit.   Allergies  Allergen Reactions   Codeine Nausea And Vomiting, Other (See Comments) and Nausea Only   Objective:   Today's Vitals   06/08/21 0932  BP: 124/82  Pulse: 76  Temp: (!) 97 F (36.1 C)  TempSrc: Temporal  SpO2: 99%  Weight: 289 lb 9.6 oz (131.4 kg)  Height: 5\' 8"  (1.727 m)   Body mass index is 44.03 kg/m.   General: Well developed, well nourished. No acute distress. Psych: Alert and oriented. Normal mood and affect.  Health Maintenance Due  Topic Date Due   Pneumococcal Vaccine 62-80 Years old (1 - PCV) Never done   Hepatitis C Screening  Never done   COVID-19 Vaccine (3 - Moderna risk series) 07/31/2020   MAMMOGRAM  09/24/2020   INFLUENZA VACCINE  06/04/2021   Imaging: CT of Abdomen and Pelvis (06/02/2021) IMPRESSION: Findings suggestive of mild sigmoid colonic diverticulitis.  MRI of Lumbar Spine (04/30/2019) Impression: Multi level degenerative disc disease. Mild spinal stenosis L4-5 with mild-mod subarticular stenosis bilat. Mild subarticular stenosis L5-S1 bilat.    Assessment & Plan:   1. Diverticulitis Continue Augmentin. Follow-up if abdominal symptoms are not resolved.  2. Lumbar degenerative disc disease Ms. 05/02/2019 has lumbar disc disease and mild spinal and subarticular stenosis of the L4-5 and L5-S1 level. She has failed prior ESI and PT. I will refer her to neurosurgery for evaluation and management recommendations.  -  Ambulatory referral to Neurosurgery  [place], MD

## 2021-07-16 ENCOUNTER — Other Ambulatory Visit: Payer: Self-pay

## 2021-07-16 ENCOUNTER — Other Ambulatory Visit: Payer: Self-pay | Admitting: Neurosurgery

## 2021-07-16 DIAGNOSIS — M5440 Lumbago with sciatica, unspecified side: Secondary | ICD-10-CM

## 2021-07-16 MED ORDER — DOFETILIDE 500 MCG PO CAPS: ORAL_CAPSULE | ORAL | 2 refills | Status: DC

## 2021-07-20 ENCOUNTER — Ambulatory Visit: Payer: BC Managed Care – PPO

## 2021-07-29 ENCOUNTER — Emergency Department (HOSPITAL_COMMUNITY)
Admission: EM | Admit: 2021-07-29 | Discharge: 2021-07-29 | Disposition: A | Discharge status: Home / Self Care | Payer: BC Managed Care – PPO | Attending: Emergency Medicine | Admitting: Emergency Medicine

## 2021-07-29 ENCOUNTER — Encounter (HOSPITAL_COMMUNITY): Payer: Self-pay

## 2021-07-29 ENCOUNTER — Emergency Department (HOSPITAL_COMMUNITY): Payer: BC Managed Care – PPO

## 2021-07-29 ENCOUNTER — Other Ambulatory Visit: Payer: Self-pay

## 2021-07-29 DIAGNOSIS — Z79899 Other long term (current) drug therapy: Secondary | ICD-10-CM | POA: Diagnosis not present

## 2021-07-29 DIAGNOSIS — R0789 Other chest pain: Secondary | ICD-10-CM

## 2021-07-29 DIAGNOSIS — Z87891 Personal history of nicotine dependence: Secondary | ICD-10-CM | POA: Diagnosis not present

## 2021-07-29 DIAGNOSIS — K8020 Calculus of gallbladder without cholecystitis without obstruction: Secondary | ICD-10-CM

## 2021-07-29 DIAGNOSIS — I10 Essential (primary) hypertension: Secondary | ICD-10-CM | POA: Diagnosis not present

## 2021-07-29 DIAGNOSIS — R109 Unspecified abdominal pain: Secondary | ICD-10-CM

## 2021-07-29 DIAGNOSIS — J449 Chronic obstructive pulmonary disease, unspecified: Secondary | ICD-10-CM | POA: Insufficient documentation

## 2021-07-29 DIAGNOSIS — Z7901 Long term (current) use of anticoagulants: Secondary | ICD-10-CM | POA: Diagnosis not present

## 2021-07-29 DIAGNOSIS — R079 Chest pain, unspecified: Secondary | ICD-10-CM

## 2021-07-29 LAB — HEPATIC FUNCTION PANEL
ALT: 56 U/L — ABNORMAL HIGH (ref 0–44)
AST: 74 U/L — ABNORMAL HIGH (ref 15–41)
Albumin: 3.2 g/dL — ABNORMAL LOW (ref 3.5–5.0)
Alkaline Phosphatase: 73 U/L (ref 38–126)
Bilirubin, Direct: 0.5 mg/dL — ABNORMAL HIGH (ref 0.0–0.2)
Indirect Bilirubin: 0.9 mg/dL (ref 0.3–0.9)
Total Bilirubin: 1.4 mg/dL — ABNORMAL HIGH (ref 0.3–1.2)
Total Protein: 6.6 g/dL (ref 6.5–8.1)

## 2021-07-29 LAB — BASIC METABOLIC PANEL
Anion gap: 13 (ref 5–15)
BUN: 11 mg/dL (ref 6–20)
CO2: 21 mmol/L — ABNORMAL LOW (ref 22–32)
Calcium: 9.1 mg/dL (ref 8.9–10.3)
Chloride: 105 mmol/L (ref 98–111)
Creatinine, Ser: 0.79 mg/dL (ref 0.44–1.00)
GFR, Estimated: >60 mL/min (ref >60)
Glucose, Bld: 132 mg/dL — ABNORMAL HIGH (ref 70–99)
Potassium: 3.6 mmol/L (ref 3.5–5.1)
Sodium: 139 mmol/L (ref 135–145)

## 2021-07-29 LAB — CBC
HCT: 34.6 % — ABNORMAL LOW (ref 36.0–46.0)
Hemoglobin: 11.2 g/dL — ABNORMAL LOW (ref 12.0–15.0)
MCH: 29.7 pg (ref 26.0–34.0)
MCHC: 32.4 g/dL (ref 30.0–36.0)
MCV: 91.8 fL (ref 80.0–100.0)
Platelets: 169 10*3/uL (ref 150–400)
RBC: 3.77 MIL/uL — ABNORMAL LOW (ref 3.87–5.11)
RDW: 12.6 % (ref 11.5–15.5)
WBC: 6.1 10*3/uL (ref 4.0–10.5)
nRBC: 0 % (ref 0.0–0.2)

## 2021-07-29 LAB — TROPONIN I (HIGH SENSITIVITY)
Troponin I (High Sensitivity): 5 ng/L (ref <18)
Troponin I (High Sensitivity): 3 ng/L (ref <18)
Troponin I (High Sensitivity): <2 ng/L (ref <18)

## 2021-07-29 MED ORDER — ONDANSETRON 4 MG PO TBDP: 4.0000 mg | ORAL_TABLET | Freq: Three times a day (TID) | ORAL | 0 refills | Status: DC | PRN

## 2021-07-29 MED ORDER — IOHEXOL 350 MG/ML SOLN
75.0000 mL | Freq: Once | INTRAVENOUS | Status: AC | PRN
  Administered 2021-07-29: 75 mL via INTRAVENOUS

## 2021-07-29 MED ORDER — HYDROCODONE-ACETAMINOPHEN 5-325 MG PO TABS: 1.0000 | ORAL_TABLET | ORAL | 0 refills | Status: DC | PRN

## 2021-07-29 MED ORDER — NITROGLYCERIN 0.4 MG SL SUBL
0.4000 mg | SUBLINGUAL_TABLET | SUBLINGUAL | Status: DC | PRN
  Administered 2021-07-29: 0.4 mg via SUBLINGUAL
  Filled 2021-07-29: qty 1

## 2021-07-29 NOTE — ED Triage Notes (Signed)
Pt stated CP started 0300 and woke pt up out of sleep. Pt endorses hx of afib and CHF. Pt BIBA via [facility], was c/o SOB, nausea. PTA medic gave x1 SL Nitro. Pt took x1 324 ASA prior to medic arrival. 12 Lead normal  BP: 125/75 HR: 77 SpO2: 97

## 2021-07-29 NOTE — ED Notes (Addendum)
Back to exam room from w/r by w/c. EDP in to see pt, updated. Imaging, EKG, VS and incomplete labs reviewed. BMP and troponin pending. Pt alert, NAD, calm, interactive. Rates CP 1/10 at this time. Improved with ntg by EMS earlier. ASA taken PTA.

## 2021-07-29 NOTE — ED Notes (Signed)
Remains in US.

## 2021-07-29 NOTE — ED Notes (Signed)
Alert, NAD, calm, interactive, EDP at [facility], 3rd trop sent, pending [place]. VSS. Denies pain

## 2021-07-29 NOTE — Discharge Instructions (Addendum)
Avoid greasy and fatty foods

## 2021-07-29 NOTE — ED Provider Notes (Signed)
Emergency Medicine Provider Triage Evaluation Note  Regina Morales , a 60 y.o. female  was evaluated in triage.  Pt complains of chest pain or shortness of breath, brought in by EMS.  Patient states symptoms woke her from her sleep at 3 AM today, described a squeezing sensation in her chest to left and right axillary areas, improved after taking aspirin, was given 1 sublingual nitro by EMS and currently rates pain as a 1 out of 10.  Shortness of breath when she was having pain earlier however no longer having any shortness of breath.  Does report nausea, denies diaphoresis.  History of A. fib, on Eliquis, no missed doses.  EMS reports NSR.  Review of Systems  Positive: Chest pain, shortness of breath, nausea Negative: Diaphoresis  Physical Exam  BP 139/87 (BP Location: Left Arm)   Pulse 75   Temp 98.6 F (37 C) (Oral)   Resp 20   SpO2 98%  Gen:   Awake, no distress   Resp:  Normal effort  MSK:   Moves extremities without difficulty  Other:    Medical Decision Making  Medically screening exam initiated at 6:40 AM.  Appropriate orders placed.  Regina Morales was informed that the remainder of the evaluation will be completed by another provider, this initial triage assessment does not replace that evaluation, and the importance of remaining in the ED until their evaluation is complete.     Jeannie Fend, PA-C 07/29/21 3846    Wynetta Fines, MD 07/30/21 [phone]

## 2021-07-29 NOTE — ED Notes (Signed)
EDP at [facility], pt updated. 

## 2021-07-29 NOTE — ED Provider Notes (Signed)
[facility] EMERGENCY DEPARTMENT Provider Note   CSN: [id number] Arrival date & time: 07/29/21  3295     History Chief Complaint  Patient presents with   Chest Pain    Regina Morales is a 60 y.o. female.  Pt presents to the ED today with chest pressure.  She said it woke her from sleep at 0300.  It was a squeezing, pressure sensation.  She took 1 asa at home and was given 1 nitro by EMS.  Sx better after nitro.  CP is now a 1.  She did have a little sob and nausea when the pain was severe.  No hx of CAD.  She has afib and PE and is on Eliquis.  She has been compliant.  No f/c.        Past Medical History:  Diagnosis Date   Anemia    hx years ago   Buzzing in ear    "right; even after OR"   Chest pain    a. 2009: neg MV  ([facility])   Depression    DVT (deep vein thrombosis) in pregnancy 1980's   LLE   Fibromyalgia    "dx'd many many years ago; I haven't had any problems w/it" (04/15/2017)   GERD (gastroesophageal reflux disease)    Hepatic steatosis    High cholesterol    History of blood transfusion    "low count after appendix surgery" unsure # of units transfused   Hypertension    Migraines    "maybe once/2 months" (04/15/2017)   Obesity    OSA on CPAP    Persistent atrial fibrillation (HCC)    a. Dx 07/2013;  b. CHA2DS2VASc=1 (female);  c. 07/2013 Echo:  EF 50-55%, no rwma, mod LVH.   Pulmonary embolism (HCC) ~ 1984   "after I had my last child"   SVD (spontaneous vaginal delivery)    x 4    Patient Active Problem List   Diagnosis Date Noted   Diverticulitis 06/08/2021   Lumbar degenerative disc disease 05/28/2021   Mild spinal stenosis at L4-L5 level 05/28/2021   Severe recurrent major depression without psychotic features (HCC) 04/30/2020   OSA (obstructive sleep apnea) 07/27/2019   Stage 3 severe COPD by GOLD classification (HCC) 06/24/2019   Fibromyalgia 05/03/2019   Venous insufficiency of both lower extremities 04/29/2019   Visit for  monitoring Tikosyn therapy 09/15/2018   Endometrial polyp    Normocytic anemia 04/24/2014   Migraine 04/22/2014   PAF (paroxysmal atrial fibrillation) (HCC)    GERD (gastroesophageal reflux disease)    Obesity     Past Surgical History:  Procedure Laterality Date   APPENDECTOMY  1990's   COLONOSCOPY     DILATATION & CURETTAGE/HYSTEROSCOPY WITH MYOSURE N/A 04/14/2017   Procedure: DILATATION & CURETTAGE/HYSTEROSCOPY WITH MYOSURE;  Surgeon: Myna Hidalgo, DO;  Location: [facility];  Service: Gynecology;  Laterality: N/A;  Polypectomy   DILATION AND CURETTAGE OF UTERUS  1983   mab   INNER EAR SURGERY Right 2000's   LEFT HEART CATH AND CORONARY ANGIOGRAPHY N/A 04/17/2017   Procedure: Left Heart Cath and Coronary Angiography;  Surgeon: Corky Crafts, MD;  Location: [facility] INVASIVE CV LAB;  Service: Cardiovascular;  Laterality: N/A;   PLANTAR FASCIA RELEASE Right 07/2015   TUBAL LIGATION  1985   ULNAR NERVE TRANSPOSITION Right      OB History   No obstetric history on file.     Family History  Problem Relation Age of Onset  Lung cancer Father    Alcohol abuse Father    Kidney failure Mother        s/p renal Tx   Congestive Heart Failure Mother    Atrial fibrillation Brother    Sudden Cardiac Death Daughter        cardiac arrest   Cardiomyopathy Daughter    Evelene Croon Parkinson White syndrome Grandchild         granddaughter    Social History   Tobacco Use   Smoking status: Former    Packs/day: 1.00    Years: 8.00    Pack years: 8.00    Types: Cigarettes    Quit date: 11/04/1990    Years since quitting: 30.7   Smokeless tobacco: Never  Vaping Use   Vaping Use: Never used  Substance Use Topics   Alcohol use: Not Currently   Drug use: No    Home Medications Prior to Admission medications   Medication Sig Start Date End Date Taking? Authorizing Provider  HYDROcodone-acetaminophen (NORCO/VICODIN) 5-325 MG tablet Take 1 tablet by mouth every 4 (four) hours as needed.  07/29/21  Yes Jacalyn Lefevre, MD  ondansetron (ZOFRAN ODT) 4 MG disintegrating tablet Take 1 tablet (4 mg total) by mouth every 8 (eight) hours as needed for nausea or vomiting. 07/29/21  Yes Jacalyn Lefevre, MD  albuterol (VENTOLIN HFA) 108 (90 Base) MCG/ACT inhaler Inhale 2 puffs into the lungs every 6 (six) hours as needed for wheezing or shortness of breath.    [provider]  amoxicillin-clavulanate (AUGMENTIN) 875-125 MG tablet Take 1 tablet by mouth every 12 (twelve) hours. 06/02/21   Petrucelli, Samantha R, PA-C  apixaban (ELIQUIS) 5 MG TABS tablet Take 1 tablet (5 mg total) by mouth 2 (two) times daily. 03/15/21   Camnitz, Andree Coss, MD  diltiazem (CARDIZEM) 30 MG tablet Take 30 mg by mouth 4 (four) times daily. 1 tablet every 4 hours as needed    [provider]  divalproex (DEPAKOTE ER) 500 MG 24 hr tablet SMARTSIG:2 Tablet(s) By Mouth Every Evening 05/04/21   [provider]  dofetilide (TIKOSYN) 500 MCG capsule TAKE 1 CAPSULE(500 MCG) BY MOUTH TWICE DAILY 07/16/21   Camnitz, Andree Coss, MD  DULoxetine (CYMBALTA) 60 MG capsule Take 120 mg by mouth daily.    [provider]  lurasidone (LATUDA) 80 MG TABS tablet Take 80 mg by mouth daily with breakfast.    [provider]  metoprolol tartrate (LOPRESSOR) 50 MG tablet TAKE 1 TABLET TWICE A DAY 05/31/21   Camnitz, Will Daphine Deutscher, MD  rizatriptan (MAXALT) 10 MG tablet Take 10 mg by mouth as needed for migraine. May repeat in 2 hours if needed    [provider]  topiramate (TOPAMAX) 50 MG tablet Take 50 mg by mouth 2 (two) times daily. 08/31/20   [provider]    Allergies    Codeine  Review of Systems   Review of Systems  Respiratory:  Positive for shortness of breath.   Cardiovascular:  Positive for chest pain.  Gastrointestinal:  Positive for nausea.  All other systems reviewed and are negative.  Physical Exam Updated Vital Signs BP 127/67   Pulse 66   Temp 98.6 F (37  C) (Oral)   Resp 18   Ht 5\' 8"  (1.727 m)   Wt 129.7 kg   SpO2 98%   BMI 43.49 kg/m   Physical Exam Vitals and nursing note reviewed.  Constitutional:      Appearance: She is well-developed. She  is obese.  HENT:     Head: Normocephalic and atraumatic.  Eyes:     Extraocular Movements: Extraocular movements intact.     Pupils: Pupils are equal, round, and reactive to light.  Cardiovascular:     Rate and Rhythm: Normal rate and regular rhythm.     Heart sounds: Normal heart sounds.  Pulmonary:     Effort: Pulmonary effort is normal.     Breath sounds: Normal breath sounds.  Abdominal:     General: Bowel sounds are normal.     Palpations: Abdomen is soft.  Musculoskeletal:        General: Normal range of motion.     Cervical back: Normal range of motion and neck supple.  Skin:    General: Skin is warm.     Capillary Refill: Capillary refill takes less than 2 seconds.  Neurological:     General: No focal deficit present.     Mental Status: She is alert and oriented to person, place, and time.  Psychiatric:        Mood and Affect: Mood normal.        Behavior: Behavior normal.    ED Results / Procedures / Treatments   Labs (all labs ordered are listed, but only abnormal results are displayed) Labs Reviewed  BASIC METABOLIC PANEL - Abnormal; Notable for the following components:      Result Value   CO2 21 (*)    Glucose, Bld 132 (*)    All other components within normal limits  CBC - Abnormal; Notable for the following components:   RBC 3.77 (*)    Hemoglobin 11.2 (*)    HCT 34.6 (*)    All other components within normal limits  HEPATIC FUNCTION PANEL  TROPONIN I (HIGH SENSITIVITY)  TROPONIN I (HIGH SENSITIVITY)  TROPONIN I (HIGH SENSITIVITY)  TROPONIN I (HIGH SENSITIVITY)    EKG EKG Interpretation  Date/Time:  Sunday July 29 2021 06:22:18 EDT Ventricular Rate:  84 PR Interval:  206 QRS Duration: 84 QT Interval:  436 QTC Calculation: 515 R  Axis:   -25 Text Interpretation: Normal sinus rhythm Nonspecific T wave abnormality Abnormal ECG No significant change since last tracing Confirmed by Jacalyn Lefevre [phone]) on 07/29/2021 7:38:51 AM  Radiology DG Chest 1 View  Result Date: 07/29/2021 CLINICAL DATA:  Chest pain and shortness of breath EXAM: CHEST  1 VIEW COMPARISON:  03/22/2019 FINDINGS: The heart size and mediastinal contours are within normal limits. Both lungs are clear. The visualized skeletal structures are unremarkable. IMPRESSION: No active disease. Electronically Signed   By: Signa Kell M.D.   On: 07/29/2021 07:24   CT Angio Chest PE W and/or Wo Contrast  Result Date: 07/29/2021 CLINICAL DATA:  PE suspected, high prob EXAM: CT ANGIOGRAPHY CHEST WITH CONTRAST TECHNIQUE: Multidetector CT imaging of the chest was performed using the standard protocol during bolus administration of intravenous contrast. Multiplanar CT image reconstructions and MIPs were obtained to evaluate the vascular anatomy. CONTRAST:  29mL OMNIPAQUE IOHEXOL 350 MG/ML SOLN COMPARISON:  June 14, 2019, June 02, 2021 FINDINGS: Cardiovascular: Evaluation of the subsegmental arteries is limited due to suboptimal contrast timing. Within these limitations, no evidence of pulmonary embolism through the segmental pulmonary arteries. Heart is mildly enlarged. No pericardial effusion. LEFT vertebral artery arises directly from the aortic arch. Mediastinum/Nodes: Thyroid is unremarkable. No axillary or mediastinal adenopathy. Lungs/Pleura: Minimal centrilobular emphysema. Unchanged nodule versus scar/atelectasis of the RIGHT upper lobe along the fissure which measures approximately 7 x  6 mm (series 8, image 132). Adjacent additional nodule along the fissure measures 4 x 6 mm, unchanged (series 8, image 132). These are consistent with benign etiology given stability for greater than 2 years. Scattered atelectasis/scar, similar in comparison to prior. Upper Abdomen:  Cholelithiasis. There is a suggestion of gallbladder wall thickening on several images, incompletely assessed (series 6, image 64). Unchanged prominent porta hepatic lymph node measuring 13 mm in the short axis (series 6, image 59). Hepatic steatosis. Musculoskeletal: No acute osseous abnormality. Review of the MIP images confirms the above findings. IMPRESSION: 1. No evidence of acute pulmonary embolism through the subsegmental pulmonary arteries. 2. Partial visualization of cholelithiasis with questionably thickened gallbladder wall. This is nonspecific and could reflect chronic cholecystitis, adenomyomatosis, or acute cholecystitis. Recommend correlation with physical exam. If concern for acute cholecystitis, this would be better assessed with dedicated ultrasound. Electronically Signed   By: Meda Klinefelter M.D.   On: 07/29/2021 12:24   US Abdomen Limited RUQ (LIVER/GB)  Result Date: 07/29/2021 CLINICAL DATA:  Abdominal pain. EXAM: ULTRASOUND ABDOMEN LIMITED RIGHT UPPER QUADRANT COMPARISON:  CTA chest obtained earlier today. FINDINGS: Gallbladder: Multiple gallstones. Wall measures 2 mm, normal thickness. Trace pericholecystic fluid. No sonographic Murphy's sign. Common bile duct: Diameter: 3 mm Liver: Increased echogenicity. No mass. Portal vein is patent on color Doppler imaging with normal direction of blood flow towards the liver. Other: None. IMPRESSION: 1. Multiple gallstones, but no gallbladder wall thickening. Trace amount of pericholecystic fluid. Lack of gallbladder wall thickening argues against acute cholecystitis. 2. Hepatic steatosis. Electronically Signed   By: Amie Portland M.D.   On: 07/29/2021 15:28    Procedures Procedures   Medications Ordered in ED Medications  nitroGLYCERIN (NITROSTAT) SL tablet 0.4 mg (0.4 mg Sublingual Given 07/29/21 0834)  iohexol (OMNIPAQUE) 350 MG/ML injection 75 mL (75 mLs Intravenous Contrast Given 07/29/21 1203)    ED Course  I have reviewed the  triage vital signs and the nursing notes.  Pertinent labs & imaging results that were available during my care of the patient were reviewed by me and considered in my medical decision making (see chart for details).    MDM Rules/Calculators/A&P                           Pt's cardiac work up has been negative.  EKG and 3 troponins are negative.    CTA chest negative for PE.  Possible cholecystitis.  GB US ordered.  GB shows gallstones, but no wall thickening.  Pt is instructed to avoid fatty/greasy foods.  She is to f/u with surgery.  Return if worse.   Final Clinical Impression(s) / ED Diagnoses Final diagnoses:  Abdominal pain  Atypical chest pain  Calculus of gallbladder without cholecystitis without obstruction    Rx / DC Orders ED Discharge Orders          Ordered    HYDROcodone-acetaminophen (NORCO/VICODIN) 5-325 MG tablet  Every 4 hours PRN        07/29/21 1533    ondansetron (ZOFRAN ODT) 4 MG disintegrating tablet  Every 8 hours PRN        07/29/21 1533             Jacalyn Lefevre, MD 07/29/21 1536

## 2021-07-29 NOTE — ED Notes (Signed)
Back from US, alert, NAD, calm, interactive.  

## 2021-08-03 ENCOUNTER — Other Ambulatory Visit: Payer: BC Managed Care – PPO

## 2021-08-03 ENCOUNTER — Ambulatory Visit
Admission: RE | Admit: 2021-08-03 | Discharge: 2021-08-03 | Disposition: A | Discharge status: Home / Self Care | Payer: BC Managed Care – PPO | Source: Ambulatory Visit | Attending: Neurosurgery | Admitting: Neurosurgery

## 2021-08-03 DIAGNOSIS — M5440 Lumbago with sciatica, unspecified side: Secondary | ICD-10-CM

## 2021-08-20 ENCOUNTER — Telehealth: Payer: Self-pay | Admitting: *Deleted

## 2021-08-20 ENCOUNTER — Ambulatory Visit: Payer: Self-pay | Admitting: Surgery

## 2021-08-20 DIAGNOSIS — K8020 Calculus of gallbladder without cholecystitis without obstruction: Secondary | ICD-10-CM | POA: Insufficient documentation

## 2021-08-20 DIAGNOSIS — R1013 Epigastric pain: Secondary | ICD-10-CM | POA: Insufficient documentation

## 2021-08-20 NOTE — H&P (Signed)
History of Present Illness: Regina Morales is a 60 y.o. female who was referred to me for evaluation of gallstones.  She was seen in the ED on 9/25 with chest pressure.  She says the pain was in her lower chest and upper abdomen, and radiated across both sides of the upper abdomen.  She says that prior to that she had had several similar episodes of pain but they were much less severe.  Cardiac work-up at that time was negative, but a RUQ US showed cholelithiasis without cholecystitis, and the common bile duct was normal in diameter.  Total bilirubin was mildly elevated at 1.4, with a very mild elevation in transaminases (AST/ALT 74/56).  Alk phos was normal.  She was referred to me to discuss cholecystectomy.   She has a remote history of DVT/PE (>30 years ago during pregnancy) and also has a-fib, for which she is on Eliquis.  She also has a history of sudden cardiac death in multiple family members and has been referred by her cardiologist for genetic testing.     Review of Systems: A complete review of systems was obtained from the patient.  I have reviewed this information and discussed as appropriate with the patient.  See HPI as well for other ROS.     Medical History: Past Medical History Past Medical History: Diagnosis Date  Anxiety    Atrial fibrillation (CMS-HCC)    COPD (chronic obstructive pulmonary disease) (CMS-HCC)    DVT (deep venous thrombosis) (CMS-HCC)    GERD (gastroesophageal reflux disease)    Sleep apnea        Patient Active Problem List Diagnosis  Epigastric abdominal pain  Calculus of gallbladder without cholecystitis without obstruction     Past Surgical History Past Surgical History: Procedure Laterality Date  APPENDECTOMY          Allergies Allergies Allergen Reactions  Codeine Dizziness, Headache, Nausea And Vomiting, Nausea and Other (See Comments)      Current Outpatient Medications on File Prior to  Visit Medication Sig Dispense Refill  albuterol 90 mcg/actuation inhaler Inhale into the lungs      apixaban (ELIQUIS) 5 mg tablet Eliquis 5 mg tablet      dofetilide (TIKOSYN) 500 MCG capsule TAKE 1 CAPSULE(500 MCG) BY MOUTH TWICE DAILY      topiramate (TOPAMAX) 50 MG tablet Take 50 mg by mouth 2 (two) times daily      diltiazem (CARDIZEM) 30 MG tablet Take by mouth      divalproex (DEPAKOTE ER) 500 MG ER tablet        DULoxetine (CYMBALTA) 60 MG DR capsule TAKE 2 CAPSULES BY MOUTH EVERY DAY UNTIL DIRECTED TO STOP      imipramine (TOFRANIL) 25 MG tablet imipramine 25 mg tablet      LATUDA 60 mg tablet        metoprolol tartrate (LOPRESSOR) 25 MG tablet metoprolol tartrate 25 mg tablet      oxyCODONE-acetaminophen (PERCOCET) 5-325 mg tablet oxycodone-acetaminophen 5 mg-325 mg tablet  Take 1 tablet 4 times a day by oral route as needed for pain for 7 days.      pantoprazole (PROTONIX) 40 MG DR tablet Take 40 mg by mouth once daily      pregabalin (LYRICA) 50 MG capsule Take by mouth      ranitidine (ZANTAC) 300 MG tablet ranitidine 300 mg tablet      rizatriptan (MAXALT) 10 MG tablet Take by mouth      tiZANidine (ZANAFLEX) 4 MG   capsule Take 4 mg by mouth every 6 (six) hours as needed      topiramate (TROKENDI XR) 100 mg XR capsule Take 100 mg by mouth once daily       No current facility-administered medications on file prior to visit.     Family History Family History Problem Relation Age of Onset  Obesity Mother    Coronary Artery Disease (Blocked arteries around heart) Mother    Coronary Artery Disease (Blocked arteries around heart) Father        Social History   Tobacco Use Smoking Status Former Smoker  Quit date: 1992  Years since quitting: 30.8 Smokeless Tobacco Never Used     Social History Social History    Socioeconomic History  Marital status: Divorced Tobacco Use  Smoking status: Former Smoker     Quit date: 1992     Years since  quitting: 30.8  Smokeless tobacco: Never Used [employer] Use: Never used Substance and Sexual Activity  Alcohol use: Not Currently  Drug use: Defer  Sexual activity: Defer      Objective:     Vitals:   08/20/21 0944 BP: (!) 128/94 Pulse: 96 Temp: 36.7 C (98.1 F) SpO2: 97% Weight: (!) 129.1 kg (284 lb 9.6 oz) Height: 172.7 cm (5' 8")   Body mass index is 43.27 kg/m.   Physical Exam Vitals reviewed.  Constitutional:      General: She is not in acute distress.    Appearance: Normal appearance.  HENT:     Head: Normocephalic and atraumatic.  Eyes:     General: No scleral icterus.    Pupils: Pupils are equal, round, and reactive to light.  Cardiovascular:     Rate and Rhythm: Normal rate and regular rhythm.  Pulmonary:     Effort: Pulmonary effort is normal. No respiratory distress.     Breath sounds: Normal breath sounds. No wheezing.  Abdominal:     General: There is no distension.     Palpations: Abdomen is soft.     Tenderness: There is no abdominal tenderness.  Musculoskeletal:        General: Normal range of motion.     Cervical back: Normal range of motion.  Skin:    General: Skin is warm and dry.     Coloration: Skin is not jaundiced.  Neurological:     General: No focal deficit present.     Mental Status: She is alert and oriented to person, place, and time.  Psychiatric:        Mood and Affect: Mood normal.        Behavior: Behavior normal.        Thought Content: Thought content normal.            Assessment and Plan: Diagnoses and all orders for this visit:   Calculus of gallbladder without cholecystitis without obstruction       This is a 60 year old female with symptomatic cholelithiasis.  I reviewed her ultrasound, which shows multiple shadowing stones within the gallbladder, but no gallbladder wall thickening or pericholecystic fluid.  No intrahepatic biliary ductal dilation.  She did have elevated LFTs during this episode of  pain and lipase was normal.  She may have passed a stone through the common bile duct but does not appear clinically jaundiced today and has not had further pain. Laparoscopic cholecystectomy with intraoperative cholangiogram was recommended. The details of this procedure were discussed with the patient, including the risks of bleeding, infection, bile  leak, and <0.5% risk of common bile duct injury. The patient expressed understanding and agrees to proceed with surgery.  We will obtain medical clearance from her cardiologist prior to scheduling for surgery.  She will need to hold her Eliquis for at least 48 hours prior to surgery.  Willam Munford, MD [facility], Hepatobiliary and Pancreatic Surgery 08/20/21 10:22 AM   

## 2021-08-20 NOTE — H&P (View-Only) (Signed)
History of Present Illness: Regina Morales is a 60 y.o. female who was referred to me for evaluation of gallstones.  She was seen in the ED on 9/25 with chest pressure.  She says the pain was in her lower chest and upper abdomen, and radiated across both sides of the upper abdomen.  She says that prior to that she had had several similar episodes of pain but they were much less severe.  Cardiac work-up at that time was negative, but a RUQ US showed cholelithiasis without cholecystitis, and the common bile duct was normal in diameter.  Total bilirubin was mildly elevated at 1.4, with a very mild elevation in transaminases (AST/ALT 74/56).  Alk phos was normal.  She was referred to me to discuss cholecystectomy.   She has a remote history of DVT/PE (>30 years ago during pregnancy) and also has a-fib, for which she is on Eliquis.  She also has a history of sudden cardiac death in multiple family members and has been referred by her cardiologist for genetic testing.     Review of Systems: A complete review of systems was obtained from the patient.  I have reviewed this information and discussed as appropriate with the patient.  See HPI as well for other ROS.     Medical History: Past Medical History Past Medical History: Diagnosis Date  Anxiety    Atrial fibrillation (CMS-HCC)    COPD (chronic obstructive pulmonary disease) (CMS-HCC)    DVT (deep venous thrombosis) (CMS-HCC)    GERD (gastroesophageal reflux disease)    Sleep apnea        Patient Active Problem List Diagnosis  Epigastric abdominal pain  Calculus of gallbladder without cholecystitis without obstruction     Past Surgical History Past Surgical History: Procedure Laterality Date  APPENDECTOMY          Allergies Allergies Allergen Reactions  Codeine Dizziness, Headache, Nausea And Vomiting, Nausea and Other (See Comments)      Current Outpatient Medications on File Prior to  Visit Medication Sig Dispense Refill  albuterol 90 mcg/actuation inhaler Inhale into the lungs      apixaban (ELIQUIS) 5 mg tablet Eliquis 5 mg tablet      dofetilide (TIKOSYN) 500 MCG capsule TAKE 1 CAPSULE(500 MCG) BY MOUTH TWICE DAILY      topiramate (TOPAMAX) 50 MG tablet Take 50 mg by mouth 2 (two) times daily      diltiazem (CARDIZEM) 30 MG tablet Take by mouth      divalproex (DEPAKOTE ER) 500 MG ER tablet        DULoxetine (CYMBALTA) 60 MG DR capsule TAKE 2 CAPSULES BY MOUTH EVERY DAY UNTIL DIRECTED TO STOP      imipramine (TOFRANIL) 25 MG tablet imipramine 25 mg tablet      LATUDA 60 mg tablet        metoprolol tartrate (LOPRESSOR) 25 MG tablet metoprolol tartrate 25 mg tablet      oxyCODONE-acetaminophen (PERCOCET) 5-325 mg tablet oxycodone-acetaminophen 5 mg-325 mg tablet  Take 1 tablet 4 times a day by oral route as needed for pain for 7 days.      pantoprazole (PROTONIX) 40 MG DR tablet Take 40 mg by mouth once daily      pregabalin (LYRICA) 50 MG capsule Take by mouth      ranitidine (ZANTAC) 300 MG tablet ranitidine 300 mg tablet      rizatriptan (MAXALT) 10 MG tablet Take by mouth      tiZANidine (ZANAFLEX) 4 MG  capsule Take 4 mg by mouth every 6 (six) hours as needed      topiramate (TROKENDI XR) 100 mg XR capsule Take 100 mg by mouth once daily       No current facility-administered medications on file prior to visit.     Family History Family History Problem Relation Age of Onset  Obesity Mother    Coronary Artery Disease (Blocked arteries around heart) Mother    Coronary Artery Disease (Blocked arteries around heart) Father        Social History   Tobacco Use Smoking Status Former Smoker  Quit date: 1992  Years since quitting: 30.8 Smokeless Tobacco Never Used     Social History Social History    Socioeconomic History  Marital status: Divorced Tobacco Use  Smoking status: Former Smoker     Quit date: 1992     Years since  quitting: 30.8  Smokeless tobacco: Never Used Surveyor, mining Use: Never used Substance and Sexual Activity  Alcohol use: Not Currently  Drug use: Defer  Sexual activity: Defer      Objective:     Vitals:   08/20/21 0944 BP: (!) 128/94 Pulse: 96 Temp: 36.7 C (98.1 F) SpO2: 97% Weight: (!) 129.1 kg (284 lb 9.6 oz) Height: 172.7 cm (5' 8")   Body mass index is 43.27 kg/m.   Physical Exam Vitals reviewed.  Constitutional:      General: She is not in acute distress.    Appearance: Normal appearance.  HENT:     Head: Normocephalic and atraumatic.  Eyes:     General: No scleral icterus.    Pupils: Pupils are equal, round, and reactive to light.  Cardiovascular:     Rate and Rhythm: Normal rate and regular rhythm.  Pulmonary:     Effort: Pulmonary effort is normal. No respiratory distress.     Breath sounds: Normal breath sounds. No wheezing.  Abdominal:     General: There is no distension.     Palpations: Abdomen is soft.     Tenderness: There is no abdominal tenderness.  Musculoskeletal:        General: Normal range of motion.     Cervical back: Normal range of motion.  Skin:    General: Skin is warm and dry.     Coloration: Skin is not jaundiced.  Neurological:     General: No focal deficit present.     Mental Status: She is alert and oriented to person, place, and time.  Psychiatric:        Mood and Affect: Mood normal.        Behavior: Behavior normal.        Thought Content: Thought content normal.            Assessment and Plan: Diagnoses and all orders for this visit:   Calculus of gallbladder without cholecystitis without obstruction       This is a 60 year old female with symptomatic cholelithiasis.  I reviewed her ultrasound, which shows multiple shadowing stones within the gallbladder, but no gallbladder wall thickening or pericholecystic fluid.  No intrahepatic biliary ductal dilation.  She did have elevated LFTs during this episode of  pain and lipase was normal.  She may have passed a stone through the common bile duct but does not appear clinically jaundiced today and has not had further pain. Laparoscopic cholecystectomy with intraoperative cholangiogram was recommended. The details of this procedure were discussed with the patient, including the risks of bleeding, infection, bile  leak, and <0.5% risk of common bile duct injury. The patient expressed understanding and agrees to proceed with surgery.  We will obtain medical clearance from her cardiologist prior to scheduling for surgery.  She will need to hold her Eliquis for at least 48 hours prior to surgery.  Michaelle Birks, MD [facility] General, Hepatobiliary and Pancreatic Surgery 08/20/21 10:22 AM

## 2021-08-20 NOTE — Telephone Encounter (Deleted)
   Pre-operative Risk Assessment    Patient Name: Regina Morales  DOB: 16-Aug-1961 MRN: [id number]{ HEARTCARE STAFF-IMPORTANT INSTRUCTIONS 1 Red and Blue Text will auto delete once note is signed or closed. 2 Press F2 to navigate through template.  3 Reason for Visit format is IMPORTANT!!  See Directions on No. 2 below. 4 Please review chart to determine if there is already a clearance note open for this procedure!!  DO NOT duplicate if a note already exists!!    :1}      Request for Surgical Clearance{ 1. What type of surgery is being performed? Enter name of procedure below and number of teeth if dental extraction.  :1}   Procedure:   LAP CHOLE WITH INTRAOPERATIVE CHOLANGIOGRAM TBD Date of Surgery: Clearance TBD                             { For convenience, highlight and copy (CTL+C) the Clearance MM/DD/YY phrase above. Click here to go to Reason for Visit.  Paste (CTL+V) the date.  Engineer, drilling.  Then click button underneath called Add Clearance MM/DD/YY as free text.     :1}  { 3. What is the name of the Surgeon, the Surgeon's Group or Practice, phone and fax number?  Press F2 and list below :1}  Surgeon:  DR. Sophronia Simas Surgeon's Group or Practice Name: [facility] [place] [facility] Phone number: [phone] Fax number:  [phone] ATTN: Ishmael Holter, CMA { 4. What type of clearance is requested?  Medical or Cardiac Clearance only?  Pharmacy Clearance Only (Request is to hold medication only)?  Or Both?  Press F2 and select the clearance requested     :}  Type of Clearance Requested: BOTH; HOLD ELIQUIS { 5. What type of anesthesia will be used?  Press F2 and select the anesthesia to be used for the procedure.  :1}  Type of Anesthesia:   General  { 6. Are there any other requests or questions from the surgeon?    :1}  Additional requests/questions:    Elpidio Anis   08/20/2021, 5:37 PM

## 2021-08-20 NOTE — Telephone Encounter (Signed)
   Pre-operative Risk Assessment    Patient Name: Regina Morales  DOB: 1961/07/30 MRN: [id number]      Request for Surgical Clearance   Procedure:   LAP COLE w/INTRAOPERATIVE  CHOLANGIOGRAM  Date of Surgery: Clearance TBD                                 Surgeon:  DR. Sophronia Simas Surgeon's Group or Practice Name:  [facility] [place] [facility] Phone number:  [phone] Fax number:  [phone]   Type of Clearance Requested: BOTH NEED MEDICAL AND PHARM CLEARANCE ; HOLD ELIQUIS   Type of Anesthesia:   General    Additional requests/questions:   Elpidio Anis   08/20/2021, 5:45 PM

## 2021-08-21 NOTE — Telephone Encounter (Signed)
Clinical pharmacist to review Eliquis 

## 2021-08-22 NOTE — Telephone Encounter (Signed)
Left message to call back on 08/22/2021 at 1327.  Patient needs call back

## 2021-08-22 NOTE — Telephone Encounter (Signed)
Patient with diagnosis of A Fib on Eliquis for anticoagulation.    Procedure:  LAP COLE w/INTRAOPERATIVE  CHOLANGIOGRAM Date of procedure: TBD   CHA2DS2-VASc Score = 2  {This indicates a 2.2% annual risk of stroke. The patient's score is based upon: CHF History: 0 HTN History: 1 Diabetes History: 0 Stroke History: 0 Vascular Disease History: 0 (hx of DVT in pregnancy) Age Score: 0 Gender Score: 1      CrCl 133mL/min Platelet count 169K   Per office protocol, patient can hold Eliquis for 2 days prior to procedure.

## 2021-08-24 ENCOUNTER — Other Ambulatory Visit: Payer: Self-pay

## 2021-08-24 ENCOUNTER — Ambulatory Visit
Admission: RE | Admit: 2021-08-24 | Discharge: 2021-08-24 | Disposition: A | Discharge status: Home / Self Care | Payer: BC Managed Care – PPO | Source: Ambulatory Visit | Attending: Family Medicine | Admitting: Family Medicine

## 2021-08-24 DIAGNOSIS — Z1231 Encounter for screening mammogram for malignant neoplasm of breast: Secondary | ICD-10-CM

## 2021-08-24 NOTE — Telephone Encounter (Signed)
Left message to call back on 08/24/2021 at 12:52 PM.  Patient needs call back.

## 2021-08-27 NOTE — Telephone Encounter (Signed)
Notes faxed to surgeon. This phone note will be removed from the preop pool. Tereso Newcomer, PA-C  08/27/2021 10:56 AM

## 2021-08-27 NOTE — Telephone Encounter (Signed)
Patient was returning a call to the Pre-op team

## 2021-08-27 NOTE — Telephone Encounter (Signed)
   Name: Regina Morales DOB: 05/15/1961  MRN: [id number]  Primary Cardiologist: None  Chart reviewed as part of pre-operative protocol coverage.   60 year old female with paroxysmal atrial fibrillation, prior history of pulmonary embolism, hypertension, OSA, obesity, depression.  Cardiac catheterization in 2018 demonstrated no CAD.  She is on dofetilide for rhythm control.  The patient was last seen by Dr. Eden Emms 03/23/2021.  Cardiac MRI 06/2020: EF 56, normal study Echocardiogram 05/2020: EF 55-60, trivial MR Cardiac catheterization 04/2017: No CAD  Patient needs cholecystectomy.  Her apixaban needs to be held.  RCRI:  Perioperative Risk of Major Cardiac Event is (%): 0.9 (low risk) DASI:  Functional Capacity in METs is: 5.07 (functional status is good )  Patient was contacted 08/27/2021 in reference to pre-operative risk assessment for pending surgery as outlined below.    Since last seen, Regina Morales has not had chest pain or shortness of breath.  She remains fairly active despite some back issues.  Recommendations: Based on ACC/AHA guidelines, the patient is at acceptable risk for the planned procedure and may proceed without further cardiovascular testing.  According to our office protocol, the patient may hold Apixaban (Eliquis) for 2 days prior to the procedure.  This should be resumed postop as soon as it is felt to be safe.   Please call with questions.  Tereso Newcomer, PA-C 08/27/2021, 10:52 AM

## 2021-09-05 NOTE — Progress Notes (Signed)
Surgical Instructions    Your procedure is scheduled on Friday, November 11th.  Report to [facility] Main Entrance "A" at 7:00 A.M., then check in with the Admitting office.  Call this number if you have problems the morning of surgery:  [phone]   If you have any questions prior to your surgery date call [phone]: Open Monday-Friday 8am-4pm    Remember:  Do not eat or drink after midnight the night before your surgery     Take these medicines the morning of surgery with A SIP OF WATER Dofetilide (Tikosyn) Duloxetine (Cymbalta) Metoprolol Mirabegron (Myrbetriq)  If needed: Albuterol inhaler (bring with you on day of surgery) Hydrocodone-Acetaminophen (Norco) Zofran  Follow your surgeon's instructions on when to stop Eliquis.  If no instructions were given by your surgeon then you will need to call the office to get those instructions.    As of today, STOP taking any Aspirin (unless otherwise instructed by your surgeon) Aleve, Naproxen, Ibuprofen, Motrin, Advil, Goody's, BC's, all herbal medications, fish oil, and all vitamins.  DAY OF SURGERY:         Do not wear jewelry, makeup, or nail polish Do not wear lotions, powders, perfumes, or deodorant. Do not shave 48 hours prior to surgery.   Do not bring valuables to the hospital.              [facility] is not responsible for any belongings or valuables.  Do NOT Smoke (Tobacco/Vaping)  24 hours prior to your procedure  If you use a CPAP at night, you may bring your mask for your overnight stay.   Contacts, glasses, hearing aids, dentures or partials may not be worn into surgery, please bring cases for these belongings   For patients admitted to the hospital, discharge time will be determined by your treatment team.   Patients discharged the day of surgery will not be allowed to drive home, and someone needs to stay with them for 24 hours.  NO VISITORS WILL BE ALLOWED IN PRE-OP WHERE PATIENTS ARE PREPPED FOR  SURGERY.  ONLY 1 SUPPORT PERSON MAY BE PRESENT IN THE WAITING ROOM WHILE YOU ARE IN SURGERY.  IF YOU ARE TO BE ADMITTED, ONCE YOU ARE IN YOUR ROOM YOU WILL BE ALLOWED TWO (2) VISITORS. 1 (ONE) VISITOR MAY STAY OVERNIGHT BUT MUST ARRIVE TO THE ROOM BY 8pm.  Minor children may have two parents present. Special consideration for safety and communication needs will be reviewed on a case by case basis.  Special instructions:    Oral Hygiene is also important to reduce your risk of infection.  Remember - BRUSH YOUR TEETH THE MORNING OF SURGERY WITH YOUR REGULAR TOOTHPASTE   [facility]- Preparing For Surgery  Before surgery, you can play an important role. Because skin is not sterile, your skin needs to be as free of germs as possible. You can reduce the number of germs on your skin by washing with CHG (chlorahexidine gluconate) Soap before surgery.  CHG is an antiseptic cleaner which kills germs and bonds with the skin to continue killing germs even after washing.     Please do not use if you have an allergy to CHG or antibacterial soaps. If your skin becomes reddened/irritated stop using the CHG.  Do not shave (including legs and underarms) for at least 48 hours prior to first CHG shower. It is OK to shave your face.  Please follow these instructions carefully.     Shower the [facility] and  the MORNING OF SURGERY with CHG Soap.   If you chose to wash your hair, wash your hair first as usual with your normal shampoo. After you shampoo, rinse your hair and body thoroughly to remove the shampoo.  Then [employer] and genitals (private parts) with your normal soap and rinse thoroughly to remove soap.  After that Use CHG Soap as you would any other liquid soap. You can apply CHG directly to the skin and wash gently with a scrungie or a clean washcloth.   Apply the CHG Soap to your body ONLY FROM THE NECK DOWN.  Do not use on open wounds or open sores. Avoid contact with your eyes, ears, mouth  and genitals (private parts). Wash Face and genitals (private parts)  with your normal soap.   Wash thoroughly, paying special attention to the area where your surgery will be performed.  Thoroughly rinse your body with warm water from the neck down.  DO NOT shower/wash with your normal soap after using and rinsing off the CHG Soap.  Pat yourself dry with a CLEAN TOWEL.  Wear CLEAN PAJAMAS to bed the night before surgery  Place CLEAN SHEETS on your bed the night before your surgery  DO NOT SLEEP WITH PETS.   Day of Surgery:  Take a shower with CHG soap. Wear Clean/Comfortable clothing the morning of surgery Do not apply any deodorants/lotions.   Remember to brush your teeth WITH YOUR REGULAR TOOTHPASTE.   Please read over the following fact sheets that you were given.

## 2021-09-06 ENCOUNTER — Other Ambulatory Visit: Payer: Self-pay

## 2021-09-06 ENCOUNTER — Encounter (HOSPITAL_COMMUNITY): Payer: Self-pay

## 2021-09-06 ENCOUNTER — Encounter (HOSPITAL_COMMUNITY)
Admission: RE | Admit: 2021-09-06 | Discharge: 2021-09-06 | Disposition: A | Discharge status: Home / Self Care | Payer: BC Managed Care – PPO | Source: Ambulatory Visit | Attending: Surgery | Admitting: Surgery

## 2021-09-06 VITALS — BP 148/86 | HR 70 | Temp 98.4°F | Resp 18 | Ht 68.0 in | Wt 286.9 lb

## 2021-09-06 DIAGNOSIS — E669 Obesity, unspecified: Secondary | ICD-10-CM | POA: Diagnosis not present

## 2021-09-06 DIAGNOSIS — F32A Depression, unspecified: Secondary | ICD-10-CM | POA: Diagnosis not present

## 2021-09-06 DIAGNOSIS — Z7902 Long term (current) use of antithrombotics/antiplatelets: Secondary | ICD-10-CM | POA: Diagnosis not present

## 2021-09-06 DIAGNOSIS — I10 Essential (primary) hypertension: Secondary | ICD-10-CM | POA: Diagnosis not present

## 2021-09-06 DIAGNOSIS — I480 Paroxysmal atrial fibrillation: Secondary | ICD-10-CM | POA: Diagnosis not present

## 2021-09-06 DIAGNOSIS — G4733 Obstructive sleep apnea (adult) (pediatric): Secondary | ICD-10-CM | POA: Diagnosis not present

## 2021-09-06 DIAGNOSIS — Z9989 Dependence on other enabling machines and devices: Secondary | ICD-10-CM | POA: Insufficient documentation

## 2021-09-06 DIAGNOSIS — Z01812 Encounter for preprocedural laboratory examination: Secondary | ICD-10-CM | POA: Diagnosis present

## 2021-09-06 DIAGNOSIS — Z86711 Personal history of pulmonary embolism: Secondary | ICD-10-CM | POA: Diagnosis not present

## 2021-09-06 DIAGNOSIS — Z01818 Encounter for other preprocedural examination: Secondary | ICD-10-CM

## 2021-09-06 LAB — CBC
HCT: 36.1 % (ref 36.0–46.0)
Hemoglobin: 11.6 g/dL — ABNORMAL LOW (ref 12.0–15.0)
MCH: 29.9 pg (ref 26.0–34.0)
MCHC: 32.1 g/dL (ref 30.0–36.0)
MCV: 93 fL (ref 80.0–100.0)
Platelets: 193 10*3/uL (ref 150–400)
RBC: 3.88 MIL/uL (ref 3.87–5.11)
RDW: 12.3 % (ref 11.5–15.5)
WBC: 6.9 10*3/uL (ref 4.0–10.5)
nRBC: 0 % (ref 0.0–0.2)

## 2021-09-06 LAB — BASIC METABOLIC PANEL
Anion gap: 9 (ref 5–15)
BUN: 12 mg/dL (ref 6–20)
CO2: 23 mmol/L (ref 22–32)
Calcium: 9.1 mg/dL (ref 8.9–10.3)
Chloride: 107 mmol/L (ref 98–111)
Creatinine, Ser: 0.73 mg/dL (ref 0.44–1.00)
GFR, Estimated: >60 mL/min (ref >60)
Glucose, Bld: 101 mg/dL — ABNORMAL HIGH (ref 70–99)
Potassium: 4 mmol/L (ref 3.5–5.1)
Sodium: 139 mmol/L (ref 135–145)

## 2021-09-06 NOTE — Progress Notes (Signed)
PCP - Dr. Herbie Drape Cardiologist - Dr. [name] EP- Dr. Elberta Fortis  PPM/ICD - denies   Chest x-ray - 07/29/21 EKG - 07/30/21 Stress Test - 05/02/15 ECHO - 06/15/20 Cardiac Cath - 04/17/17  Sleep Study - 2018, diagnosed with sleep apnea CPAP - yes  DM- denies  Blood Thinner Instructions: Pt instructed to take last dose of Eliquis on 09/11/21 Aspirin Instructions: n/a  ERAS Protcol - no, NPO   COVID TEST-  n/a, ambulatory surgery   Anesthesia review: yes, cardiac hx  Patient denies shortness of breath, fever, cough and chest pain at PAT appointment   All instructions explained to the patient, with a verbal understanding of the material. Patient agrees to go over the instructions while at home for a better understanding. Patient also instructed to wear a mask in public for 3 days prior to surgery. The opportunity to ask questions was provided.

## 2021-09-07 NOTE — Anesthesia Preprocedure Evaluation (Addendum)
Anesthesia Evaluation  Patient identified by MRN, date of birth, ID band  Reviewed: Allergy & Precautions, NPO status , Patient's Chart, lab work & pertinent test results  Airway Mallampati: II  TM Distance: >3 FB Neck ROM: Full    Dental no notable dental hx. (+) Edentulous Upper, Dental Advisory Given   Pulmonary sleep apnea and Continuous Positive Airway Pressure Ventilation , former smoker,    Pulmonary exam normal breath sounds clear to auscultation       Cardiovascular hypertension, Pt. on medications + DVT  Normal cardiovascular exam+ dysrhythmias Atrial Fibrillation  Rhythm:Regular Rate:Normal  06/2020 TTE 1. Left ventricular ejection fraction, by estimation, is 55 to 60%. The left ventricle has normal function. The left ventricle has no regional wall motion abnormalities. Left ventricular diastolic parameters were normal. 2. Right ventricular systolic function is normal. The right ventricular size is normal. Tricuspid regurgitation signal is inadequate for assessing PA pressure. 3. The mitral valve is normal in structure. Trivial mitral valve regurgitation. No evidence of mitral stenosis. 4. The aortic valve is tricuspid. Aortic valve regurgitation is not visualized. No aortic stenosis is present.   Neuro/Psych  Headaches, PSYCHIATRIC DISORDERS Bipolar Disorder    GI/Hepatic Neg liver ROS, GERD  ,  Endo/Other  negative endocrine ROSMorbid obesity (BMI 42.94)  Renal/GU negative Renal ROS     Musculoskeletal  (+) Arthritis , Fibromyalgia -  Abdominal (+) + obese,   Peds  Hematology Lab Results      Component                Value               Date                      WBC                      6.9                 09/06/2021                HGB                      11.6 (L)            09/06/2021                HCT                      36.1                09/06/2021                MCV                      93.0                 09/06/2021                PLT                      193                 09/06/2021              Anesthesia Other Findings All: codeine  Reproductive/Obstetrics  Anesthesia Physical Anesthesia Plan  ASA: 3  Anesthesia Plan: General   Post-op Pain Management:    Induction: Intravenous  PONV Risk Score and Plan: 4 or greater and Treatment may vary due to age or medical condition, Midazolam, Dexamethasone, Ondansetron and Scopolamine patch - Pre-op  Airway Management Planned: Oral ETT  Additional Equipment: None  Intra-op Plan:   Post-operative Plan: Extubation in OR  Informed Consent: I have reviewed the patients History and Physical, chart, labs and discussed the procedure including the risks, benefits and alternatives for the proposed anesthesia with the patient or authorized representative who has indicated his/her understanding and acceptance.     Dental advisory given  Plan Discussed with:   Anesthesia Plan Comments: (PAT note by Karoline Caldwell, PA-C: Cardiac history and clearance per telephone encounter 08/27/21 by Richardson Dopp, PA-C: "60 year old female with paroxysmal atrial fibrillation, prior history of pulmonary embolism, hypertension, OSA, obesity, depression. Cardiac catheterization in 2018 demonstrated no CAD. She is on dofetilide for rhythm control.  The patient was last seen by Dr. Johnsie Cancel 03/23/2021.  Cardiac MRI 06/2020: EF 56, normal study Echocardiogram 05/2020: EF 55-60, trivial MR Cardiac catheterization 04/2017: No CAD  Patient needs cholecystectomy. Her apixaban needs to be held.  RCRI:Perioperative Risk of Major Cardiac Event is (%): 0.9(lowrisk) DASI:Functional Capacity in METs is: 5.07(functional status is good)  Patient was contacted10/24/2022in reference to pre-operative risk assessment for pending surgery as outlined below.   Since last seen,Nikayla A Jordanhas not had  chest pain or shortness of breath. She remains fairly active despite some back issues.  Recommendations: . Based on ACC/AHA guidelines, the patient is at acceptable risk for the planned procedure and may proceed without further cardiovascular testing. . According to our office protocol, the patient may holdApixaban (Eliquis) for 2 days prior to the procedure. This should be resumed postop as soon as it is felt to be safe."   Preop labs reviewed, mild anemia with hemoglobin 11.6, otherwise unremarkable.  EKG 07/29/2021: Read states NSR, however appears to be atrial flutter with 3:1 conduction. Pt does have previous history of paroxysmal afib. I discussed this with Roby Lofts, PA-C who is covering the cardiology preop pool. Advised that given recent workup with clean coronaries and no structural cardiac disease, pt being in a flutter likely doesn't change perioperative risk. She has also sent a message to EP team to make sure pt has appropriate followup.   )      Anesthesia Quick Evaluation

## 2021-09-07 NOTE — Progress Notes (Signed)
Anesthesia Chart Review:  Cardiac history and clearance per telephone encounter 08/27/21 by Regina Newcomer, PA-C: 60 year old female with paroxysmal atrial fibrillation, prior history of pulmonary embolism, hypertension, OSA, obesity, depression.  Cardiac catheterization in 2018 demonstrated no CAD.  She is on dofetilide for rhythm control.   The patient was last seen by Dr. Eden Emms 03/23/2021.   Cardiac MRI 06/2020: EF 56, normal study Echocardiogram 05/2020: EF 55-60, trivial MR Cardiac catheterization 04/2017: No CAD   Patient needs cholecystectomy.  Her apixaban needs to be held.   RCRI:  Perioperative Risk of Major Cardiac Event is (%): 0.9 (low risk) DASI:  Functional Capacity in METs is: 5.07 (functional status is good )   Patient was contacted 08/27/2021 in reference to pre-operative risk assessment for pending surgery as outlined below.     Since last seen, Regina Morales has not had chest pain or shortness of breath.  She remains fairly active despite some back issues.   Recommendations: Based on ACC/AHA guidelines, the patient is at acceptable risk for the planned procedure and may proceed without further cardiovascular testing.  According to our office protocol, the patient may hold Apixaban (Eliquis) for 2 days prior to the procedure.  This should be resumed postop as soon as it is felt to be safe.   Preop labs reviewed, mild anemia with hemoglobin 11.6, otherwise unremarkable.  EKG 07/29/2021: Read states NSR, however appears to be atrial flutter with 3:1 conduction. Pt does have previous history of paroxysmal afib. I discussed this with Regina Pimple, PA-C who is covering the cardiology preop pool. Advised that given recent workup with clean coronaries and no structural cardiac disease, pt being in a flutter likely doesn't change perioperative risk. She has also sent a message to EP team to make sure pt has appropriate followup.    Regina Morales [facility] Short Stay  Center/Anesthesiology Phone [phone] 09/07/2021 4:30 PM

## 2021-09-12 ENCOUNTER — Other Ambulatory Visit: Payer: Self-pay

## 2021-09-13 ENCOUNTER — Ambulatory Visit (INDEPENDENT_AMBULATORY_CARE_PROVIDER_SITE_OTHER): Payer: BC Managed Care – PPO | Admitting: Family Medicine

## 2021-09-13 VITALS — BP 120/78 | HR 80 | Temp 97.8°F | Ht 68.0 in | Wt 282.4 lb

## 2021-09-13 DIAGNOSIS — M5136 Other intervertebral disc degeneration, lumbar region: Secondary | ICD-10-CM

## 2021-09-13 DIAGNOSIS — K8020 Calculus of gallbladder without cholecystitis without obstruction: Secondary | ICD-10-CM | POA: Diagnosis not present

## 2021-09-13 DIAGNOSIS — R32 Unspecified urinary incontinence: Secondary | ICD-10-CM

## 2021-09-13 DIAGNOSIS — F332 Major depressive disorder, recurrent severe without psychotic features: Secondary | ICD-10-CM

## 2021-09-13 DIAGNOSIS — I480 Paroxysmal atrial fibrillation: Secondary | ICD-10-CM | POA: Diagnosis not present

## 2021-09-13 NOTE — Progress Notes (Signed)
[facility] [address] [place] [address] Dept: [phone] Dept Fax: [phone]  Chronic Care Office Visit  Subjective:    Patient ID: Regina Morales, female    DOB: 18-Mar-1961, 60 y.o..   MRN: [id number]  Chief Complaint  Patient presents with   Follow-up    3 month f/u.  Will wait till after surgery to get flu shot.     History of Present Illness:  Patient is in today for reassessment of chronic medical issues.  [name] has a history of severe depression. She is currently managed by Dr. Eustaquio Boyden (psychiatry) and Nicole Kindred (therapist). She is managed on duloxetine and divalproex for her depression. She does have a chronic head tremor that apparently is a medication side effect. She notes her psychiatrist is looking into insurance coverage for Caplyta (lumateperone). She admits that her mood continues to be depressed despite her therapies and counseling.   [name] has a history of atrial fibrillation. She is currently on Eliquis and takes Tikosyn(dofetilide) as an antiarrhythmic and metoprolol.   [name] has a history of migraine headaches. She is on topiramate and metoprolol.   [name] has a history of lumbar disc disease and mild spinal stenosis. She notes she is scheduled to have injections in her back in December.  [name] has a history of urinary incontinence. She is managed by Dr. Gloriann Loan. She is using mirabegron and is undergoing PT with pelvic exercises. She feels this is doing better.  [name] has cholelithiasis. She is scheduled tomorrow to have a cholecystectomy. She admits that she has not been able to eat as well due to fear of setting off a gallbladder attack.  Past Medical History: Patient Active Problem List   Diagnosis Date Noted   Calculus of gallbladder without cholecystitis without obstruction 08/20/2021   Epigastric abdominal pain 08/20/2021   Diverticulitis 06/08/2021    Lumbar degenerative disc disease 05/28/2021   Mild spinal stenosis at L4-L5 level 05/28/2021   Severe recurrent major depression without psychotic features ([place]) 04/30/2020   OSA (obstructive sleep apnea) 07/27/2019   Stage 3 severe COPD by GOLD classification ([place]) 06/24/2019   Fibromyalgia 05/03/2019   Venous insufficiency of both lower extremities 04/29/2019   Visit for monitoring Tikosyn therapy 09/15/2018   Endometrial polyp    Normocytic anemia 04/24/2014   Migraine 04/22/2014   PAF (paroxysmal atrial fibrillation) (HCC)    GERD (gastroesophageal reflux disease)    BMI 40.0-44.9, adult [facility])    Past Surgical History:  Procedure Laterality Date   APPENDECTOMY  1990's   BREAST CYST ASPIRATION Left 2019   @ [facility]   COLONOSCOPY     DILATATION & CURETTAGE/HYSTEROSCOPY WITH MYOSURE N/A 04/14/2017   Procedure: DILATATION & CURETTAGE/HYSTEROSCOPY WITH MYOSURE;  Surgeon: Janyth Pupa, DO;  Location: [facility];  Service: Gynecology;  Laterality: N/A;  Polypectomy   DILATION AND CURETTAGE OF UTERUS  1983   [facility]   INNER EAR SURGERY Right 2000's   LEFT HEART CATH AND CORONARY ANGIOGRAPHY N/A 04/17/2017   Procedure: Left Heart Cath and Coronary Angiography;  Surgeon: Jettie Booze, MD;  Location: [facility];  Service: Cardiovascular;  Laterality: N/A;   PLANTAR FASCIA RELEASE Right 07/2015   TUBAL LIGATION  1985   ULNAR NERVE TRANSPOSITION Right    Family History  Problem Relation Age of Onset   Lung cancer Father    Alcohol abuse Father    Kidney failure Mother  s/p renal Tx   Congestive Heart Failure Mother    Atrial fibrillation Brother    Sudden Cardiac Death Daughter        cardiac arrest   Cardiomyopathy Daughter    Phillips Odor White syndrome Grandchild         granddaughter   Outpatient Medications Prior to Visit  Medication Sig Dispense Refill   albuterol (VENTOLIN HFA) 108 (90 Base) MCG/ACT inhaler Inhale 2 puffs into the lungs every 6 (six)  hours as needed for wheezing or shortness of breath.     apixaban (ELIQUIS) 5 MG TABS tablet Take 1 tablet (5 mg total) by mouth 2 (two) times daily. 180 tablet 2   divalproex (DEPAKOTE ER) 250 MG 24 hr tablet Take 250 mg by mouth See admin instructions. Take with 500 mg for a total on 750 mg in the evening     divalproex (DEPAKOTE ER) 500 MG 24 hr tablet Take 500 mg by mouth See admin instructions. Take with 250 mg for a total of 750 mg in the evening     dofetilide (TIKOSYN) 500 MCG capsule TAKE 1 CAPSULE(500 MCG) BY MOUTH TWICE DAILY 180 capsule 2   DULoxetine (CYMBALTA) 60 MG capsule Take 120 mg by mouth daily.     HYDROcodone-acetaminophen (NORCO/VICODIN) 5-325 MG tablet Take 1 tablet by mouth every 4 (four) hours as needed. 10 tablet 0   LATUDA 60 MG TABS Take 60 mg by mouth at bedtime.     metoprolol tartrate (LOPRESSOR) 50 MG tablet TAKE 1 TABLET TWICE A DAY 180 tablet 3   mirabegron ER (MYRBETRIQ) 50 MG TB24 tablet Take 50 mg by mouth daily.     ondansetron (ZOFRAN ODT) 4 MG disintegrating tablet Take 1 tablet (4 mg total) by mouth every 8 (eight) hours as needed for nausea or vomiting. 20 tablet 0   rizatriptan (MAXALT) 10 MG tablet Take 10 mg by mouth as needed for migraine. May repeat in 2 hours if needed     topiramate (TOPAMAX) 50 MG tablet Take 50 mg by mouth 2 (two) times daily.     No facility-administered medications prior to visit.   Allergies  Allergen Reactions   Codeine Nausea And Vomiting, Other (See Comments) and Nausea Only     Objective:   Today's Vitals   09/13/21 0855  BP: 120/78  Pulse: 80  Temp: 97.8 F (36.6 C)  TempSrc: Temporal  SpO2: 96%  Weight: 282 lb 6.4 oz (128.1 kg)  Height: 5\' 8"  (1.727 m)   Body mass index is 42.94 kg/m.   General: Well developed, well nourished. No acute distress. CV: RRR without murmurs or rubs. Pulses 2+ bilaterally. Psych: Alert and oriented. Normal mood and affect.  Health Maintenance Due  Topic Date Due    Pneumococcal Vaccine 37-33 Years old (1 - PCV) Never done   Hepatitis C Screening  Never done   COVID-19 Vaccine (3 - Moderna risk series) 07/31/2020   INFLUENZA VACCINE  06/04/2021   Zoster Vaccines- Shingrix (2 of 2) 06/22/2021   Lab Results CBC Latest Ref Rng & Units 09/06/2021 07/29/2021 06/02/2021  WBC 4.0 - 10.5 K/uL 6.9 6.1 7.0  Hemoglobin 12.0 - 15.0 g/dL 11.6(L) 11.2(L) 11.9(L)  Hematocrit 36.0 - 46.0 % 36.1 34.6(L) 35.7(L)  Platelets 150 - 400 K/uL 193 169 135(L)   BMP Latest Ref Rng & Units 09/06/2021 07/29/2021 06/02/2021  Glucose 70 - 99 mg/dL 06/04/2021) 353(G) 94  BUN 6 - 20 mg/dL 12 11 16   Creatinine  0.44 - 1.00 mg/dL 0.73 0.79 0.79  BUN/Creat Ratio 9 - 23 - - -  Sodium 135 - 145 mmol/L 139 139 135  Potassium 3.5 - 5.1 mmol/L 4.0 3.6 3.8  Chloride 98 - 111 mmol/L 107 105 103  CO2 22 - 32 mmol/L 23 21(L) 24  Calcium 8.9 - 10.3 mg/dL 9.1 9.1 8.8(L)     Assessment & Plan:   1. PAF (paroxysmal atrial fibrillation) (HCC) Stable on metoprolol, dofetilide, and Eliquis.  2. Calculus of gallbladder without cholecystitis without obstruction Scheduled for surgery tomorrow. She is currently off of her Eliquis until after surgery. She will be following up with her cardiologist a week after her surgery. I woudl like to see her back in 2 months to reassess.  3. Lumbar degenerative disc disease Scheduled for ESI in December.  4. Severe recurrent major depression without psychotic features ([place]) Continues to follow with psychiatry. Psych is looking to add an atypical antipsychotic to her regimen.  Haydee Salter, MD

## 2021-09-14 ENCOUNTER — Ambulatory Visit (HOSPITAL_COMMUNITY): Payer: BC Managed Care – PPO | Admitting: Anesthesiology

## 2021-09-14 ENCOUNTER — Encounter (HOSPITAL_COMMUNITY)
Admission: RE | Disposition: A | Discharge status: Home / Self Care | Payer: Self-pay | Source: Ambulatory Visit | Attending: Surgery

## 2021-09-14 ENCOUNTER — Other Ambulatory Visit: Payer: Self-pay

## 2021-09-14 ENCOUNTER — Observation Stay (HOSPITAL_COMMUNITY): Payer: BC Managed Care – PPO

## 2021-09-14 ENCOUNTER — Ambulatory Visit (HOSPITAL_COMMUNITY): Payer: BC Managed Care – PPO

## 2021-09-14 ENCOUNTER — Ambulatory Visit (HOSPITAL_COMMUNITY): Payer: BC Managed Care – PPO | Admitting: Physician Assistant

## 2021-09-14 ENCOUNTER — Encounter (HOSPITAL_COMMUNITY): Payer: Self-pay | Admitting: Surgery

## 2021-09-14 ENCOUNTER — Inpatient Hospital Stay (HOSPITAL_COMMUNITY)
Admission: RE | Admit: 2021-09-14 | Discharge: 2021-09-17 | DRG: 357 | Disposition: A | Discharge status: Home / Self Care | Payer: BC Managed Care – PPO | Attending: Surgery | Admitting: Surgery

## 2021-09-14 DIAGNOSIS — G4733 Obstructive sleep apnea (adult) (pediatric): Secondary | ICD-10-CM | POA: Diagnosis present

## 2021-09-14 DIAGNOSIS — J449 Chronic obstructive pulmonary disease, unspecified: Secondary | ICD-10-CM | POA: Diagnosis present

## 2021-09-14 DIAGNOSIS — Z841 Family history of disorders of kidney and ureter: Secondary | ICD-10-CM

## 2021-09-14 DIAGNOSIS — I10 Essential (primary) hypertension: Secondary | ICD-10-CM | POA: Diagnosis present

## 2021-09-14 DIAGNOSIS — Z86711 Personal history of pulmonary embolism: Secondary | ICD-10-CM

## 2021-09-14 DIAGNOSIS — Z86718 Personal history of other venous thrombosis and embolism: Secondary | ICD-10-CM

## 2021-09-14 DIAGNOSIS — F32A Depression, unspecified: Secondary | ICD-10-CM | POA: Diagnosis present

## 2021-09-14 DIAGNOSIS — K8064 Calculus of gallbladder and bile duct with chronic cholecystitis without obstruction: Secondary | ICD-10-CM | POA: Diagnosis present

## 2021-09-14 DIAGNOSIS — K219 Gastro-esophageal reflux disease without esophagitis: Secondary | ICD-10-CM | POA: Diagnosis present

## 2021-09-14 DIAGNOSIS — Z8241 Family history of sudden cardiac death: Secondary | ICD-10-CM

## 2021-09-14 DIAGNOSIS — Z7901 Long term (current) use of anticoagulants: Secondary | ICD-10-CM

## 2021-09-14 DIAGNOSIS — Z6841 Body Mass Index (BMI) 40.0 and over, adult: Secondary | ICD-10-CM

## 2021-09-14 DIAGNOSIS — Y836 Removal of other organ (partial) (total) as the cause of abnormal reaction of the patient, or of later complication, without mention of misadventure at the time of the procedure: Secondary | ICD-10-CM | POA: Diagnosis present

## 2021-09-14 DIAGNOSIS — Z419 Encounter for procedure for purposes other than remedying health state, unspecified: Secondary | ICD-10-CM

## 2021-09-14 DIAGNOSIS — Z8249 Family history of ischemic heart disease and other diseases of the circulatory system: Secondary | ICD-10-CM

## 2021-09-14 DIAGNOSIS — K8050 Calculus of bile duct without cholangitis or cholecystitis without obstruction: Secondary | ICD-10-CM | POA: Diagnosis present

## 2021-09-14 DIAGNOSIS — K9186 Retained cholelithiasis following cholecystectomy: Principal | ICD-10-CM | POA: Diagnosis present

## 2021-09-14 DIAGNOSIS — G43909 Migraine, unspecified, not intractable, without status migrainosus: Secondary | ICD-10-CM | POA: Diagnosis present

## 2021-09-14 DIAGNOSIS — Z801 Family history of malignant neoplasm of trachea, bronchus and lung: Secondary | ICD-10-CM

## 2021-09-14 DIAGNOSIS — Z87891 Personal history of nicotine dependence: Secondary | ICD-10-CM

## 2021-09-14 HISTORY — PX: CHOLECYSTECTOMY: SHX55

## 2021-09-14 LAB — COMPREHENSIVE METABOLIC PANEL
ALT: 32 U/L (ref 0–44)
AST: 40 U/L (ref 15–41)
Albumin: 3.5 g/dL (ref 3.5–5.0)
Alkaline Phosphatase: 68 U/L (ref 38–126)
Anion gap: 11 (ref 5–15)
BUN: 17 mg/dL (ref 6–20)
CO2: 21 mmol/L — ABNORMAL LOW (ref 22–32)
Calcium: 8.8 mg/dL — ABNORMAL LOW (ref 8.9–10.3)
Chloride: 105 mmol/L (ref 98–111)
Creatinine, Ser: 0.86 mg/dL (ref 0.44–1.00)
GFR, Estimated: >60 mL/min (ref >60)
Glucose, Bld: 117 mg/dL — ABNORMAL HIGH (ref 70–99)
Potassium: 4.5 mmol/L (ref 3.5–5.1)
Sodium: 137 mmol/L (ref 135–145)
Total Bilirubin: 0.4 mg/dL (ref 0.3–1.2)
Total Protein: 6.6 g/dL (ref 6.5–8.1)

## 2021-09-14 SURGERY — LAPAROSCOPIC CHOLECYSTECTOMY WITH INTRAOPERATIVE CHOLANGIOGRAM
Anesthesia: General

## 2021-09-14 MED ORDER — LIDOCAINE 2% (20 MG/ML) 5 ML SYRINGE
INTRAMUSCULAR | Status: AC
  Filled 2021-09-14: qty 5

## 2021-09-14 MED ORDER — EPHEDRINE SULFATE 50 MG/ML IJ SOLN
INTRAMUSCULAR | Status: DC | PRN
  Administered 2021-09-14: 5 mg via INTRAVENOUS
  Administered 2021-09-14: 10 mg via INTRAVENOUS

## 2021-09-14 MED ORDER — GADOBUTROL 1 MMOL/ML IV SOLN
9.0000 mL | Freq: Once | INTRAVENOUS | Status: AC | PRN
  Administered 2021-09-14: 9 mL via INTRAVENOUS

## 2021-09-14 MED ORDER — LACTATED RINGERS IV SOLN: INTRAVENOUS | Status: DC

## 2021-09-14 MED ORDER — CHLORHEXIDINE GLUCONATE 0.12 % MT SOLN
15.0000 mL | Freq: Once | OROMUCOSAL | Status: AC
  Administered 2021-09-14: 15 mL via OROMUCOSAL
  Filled 2021-09-14: qty 15

## 2021-09-14 MED ORDER — SUGAMMADEX SODIUM 500 MG/5ML IV SOLN
INTRAVENOUS | Status: AC
  Filled 2021-09-14: qty 5

## 2021-09-14 MED ORDER — BUPIVACAINE-EPINEPHRINE (PF) 0.25% -1:200000 IJ SOLN
INTRAMUSCULAR | Status: AC
  Filled 2021-09-14: qty 30

## 2021-09-14 MED ORDER — GABAPENTIN 300 MG PO CAPS
300.0000 mg | ORAL_CAPSULE | ORAL | Status: AC
  Administered 2021-09-14: 300 mg via ORAL
  Filled 2021-09-14: qty 1

## 2021-09-14 MED ORDER — ONDANSETRON HCL 4 MG/2ML IJ SOLN
INTRAMUSCULAR | Status: AC
  Filled 2021-09-14: qty 2

## 2021-09-14 MED ORDER — LACTATED RINGERS IV SOLN: INTRAVENOUS | Status: DC | PRN

## 2021-09-14 MED ORDER — METOPROLOL TARTRATE 50 MG PO TABS
50.0000 mg | ORAL_TABLET | Freq: Two times a day (BID) | ORAL | Status: DC
  Administered 2021-09-14 – 2021-09-17 (×6): 50 mg via ORAL
  Filled 2021-09-14 (×6): qty 1

## 2021-09-14 MED ORDER — DOFETILIDE 500 MCG PO CAPS
500.0000 ug | ORAL_CAPSULE | Freq: Two times a day (BID) | ORAL | Status: DC
  Administered 2021-09-14 – 2021-09-17 (×6): 500 ug via ORAL
  Filled 2021-09-14 (×7): qty 1

## 2021-09-14 MED ORDER — PROPOFOL 10 MG/ML IV BOLUS
INTRAVENOUS | Status: AC
  Filled 2021-09-14: qty 20

## 2021-09-14 MED ORDER — ONDANSETRON HCL 4 MG/2ML IJ SOLN
4.0000 mg | Freq: Four times a day (QID) | INTRAMUSCULAR | Status: DC | PRN
  Administered 2021-09-16 (×2): 4 mg via INTRAVENOUS
  Filled 2021-09-14 (×2): qty 2

## 2021-09-14 MED ORDER — FENTANYL CITRATE (PF) 100 MCG/2ML IJ SOLN
INTRAMUSCULAR | Status: DC | PRN
  Administered 2021-09-14 (×3): 50 ug via INTRAVENOUS

## 2021-09-14 MED ORDER — DEXAMETHASONE SODIUM PHOSPHATE 10 MG/ML IJ SOLN
INTRAMUSCULAR | Status: AC
  Filled 2021-09-14: qty 1

## 2021-09-14 MED ORDER — SODIUM CHLORIDE 0.9 % IR SOLN
Status: DC | PRN
  Administered 2021-09-14 (×2): 1000 mL

## 2021-09-14 MED ORDER — DOCUSATE SODIUM 100 MG PO CAPS
100.0000 mg | ORAL_CAPSULE | Freq: Two times a day (BID) | ORAL | Status: DC
  Administered 2021-09-14 – 2021-09-17 (×6): 100 mg via ORAL
  Filled 2021-09-14 (×6): qty 1

## 2021-09-14 MED ORDER — KETOROLAC TROMETHAMINE 15 MG/ML IJ SOLN
15.0000 mg | Freq: Three times a day (TID) | INTRAMUSCULAR | Status: AC
  Administered 2021-09-14 – 2021-09-16 (×5): 15 mg via INTRAVENOUS
  Filled 2021-09-14 (×6): qty 1

## 2021-09-14 MED ORDER — MIRABEGRON ER 25 MG PO TB24
50.0000 mg | ORAL_TABLET | Freq: Every day | ORAL | Status: DC
  Administered 2021-09-15 – 2021-09-17 (×3): 50 mg via ORAL
  Filled 2021-09-14 (×2): qty 2
  Filled 2021-09-14: qty 1

## 2021-09-14 MED ORDER — PROPOFOL 10 MG/ML IV BOLUS
INTRAVENOUS | Status: DC | PRN
  Administered 2021-09-14: 160 mg via INTRAVENOUS

## 2021-09-14 MED ORDER — DULOXETINE HCL 60 MG PO CPEP
120.0000 mg | ORAL_CAPSULE | Freq: Every day | ORAL | Status: DC
  Administered 2021-09-15 – 2021-09-17 (×3): 120 mg via ORAL
  Filled 2021-09-14 (×3): qty 2

## 2021-09-14 MED ORDER — DEXAMETHASONE SODIUM PHOSPHATE 4 MG/ML IJ SOLN
INTRAMUSCULAR | Status: DC | PRN
  Administered 2021-09-14: 5 mg via INTRAVENOUS

## 2021-09-14 MED ORDER — ROCURONIUM BROMIDE 10 MG/ML (PF) SYRINGE
PREFILLED_SYRINGE | INTRAVENOUS | Status: DC | PRN
  Administered 2021-09-14: 80 mg via INTRAVENOUS

## 2021-09-14 MED ORDER — OXYCODONE HCL 5 MG/5ML PO SOLN: 5.0000 mg | Freq: Once | ORAL | Status: DC | PRN

## 2021-09-14 MED ORDER — KETOROLAC TROMETHAMINE 30 MG/ML IJ SOLN
INTRAMUSCULAR | Status: AC
  Administered 2021-09-14: 30 mg via INTRAVENOUS
  Filled 2021-09-14: qty 1

## 2021-09-14 MED ORDER — LIDOCAINE 2% (20 MG/ML) 5 ML SYRINGE
INTRAMUSCULAR | Status: DC | PRN
  Administered 2021-09-14: 50 mg via INTRAVENOUS

## 2021-09-14 MED ORDER — ROCURONIUM BROMIDE 10 MG/ML (PF) SYRINGE
PREFILLED_SYRINGE | INTRAVENOUS | Status: AC
  Filled 2021-09-14: qty 10

## 2021-09-14 MED ORDER — DEXTROSE 5 % IV SOLN
INTRAVENOUS | Status: DC | PRN
  Administered 2021-09-14: 3 g via INTRAVENOUS

## 2021-09-14 MED ORDER — OXYCODONE HCL 5 MG PO TABS
5.0000 mg | ORAL_TABLET | ORAL | Status: DC | PRN
  Administered 2021-09-14 – 2021-09-17 (×10): 5 mg via ORAL
  Filled 2021-09-14 (×10): qty 1

## 2021-09-14 MED ORDER — PHENYLEPHRINE 40 MCG/ML (10ML) SYRINGE FOR IV PUSH (FOR BLOOD PRESSURE SUPPORT)
PREFILLED_SYRINGE | INTRAVENOUS | Status: AC
  Filled 2021-09-14: qty 10

## 2021-09-14 MED ORDER — ALBUTEROL SULFATE (2.5 MG/3ML) 0.083% IN NEBU: 2.5000 mg | INHALATION_SOLUTION | Freq: Four times a day (QID) | RESPIRATORY_TRACT | Status: DC | PRN

## 2021-09-14 MED ORDER — EPHEDRINE 5 MG/ML INJ
INTRAVENOUS | Status: AC
  Filled 2021-09-14: qty 5

## 2021-09-14 MED ORDER — ONDANSETRON HCL 4 MG/2ML IJ SOLN
INTRAMUSCULAR | Status: DC | PRN
  Administered 2021-09-14: 4 mg via INTRAVENOUS

## 2021-09-14 MED ORDER — HYDROCODONE-ACETAMINOPHEN 5-325 MG PO TABS
1.0000 | ORAL_TABLET | Freq: Four times a day (QID) | ORAL | Status: DC | PRN
  Administered 2021-09-14: 2 via ORAL
  Filled 2021-09-14: qty 2

## 2021-09-14 MED ORDER — DIPHENHYDRAMINE HCL 50 MG/ML IJ SOLN: 25.0000 mg | Freq: Four times a day (QID) | INTRAMUSCULAR | Status: DC | PRN

## 2021-09-14 MED ORDER — PHENYLEPHRINE HCL (PRESSORS) 10 MG/ML IV SOLN
INTRAVENOUS | Status: DC | PRN
  Administered 2021-09-14 (×2): 40 ug via INTRAVENOUS

## 2021-09-14 MED ORDER — MIDAZOLAM HCL 5 MG/5ML IJ SOLN
INTRAMUSCULAR | Status: DC | PRN
  Administered 2021-09-14: 2 mg via INTRAVENOUS

## 2021-09-14 MED ORDER — ACETAMINOPHEN 500 MG PO TABS
1000.0000 mg | ORAL_TABLET | Freq: Three times a day (TID) | ORAL | Status: DC
  Administered 2021-09-14 – 2021-09-17 (×6): 1000 mg via ORAL
  Filled 2021-09-14 (×9): qty 2

## 2021-09-14 MED ORDER — OXYCODONE HCL 5 MG PO TABS: 5.0000 mg | ORAL_TABLET | Freq: Once | ORAL | Status: DC | PRN

## 2021-09-14 MED ORDER — ONDANSETRON 4 MG PO TBDP: 4.0000 mg | ORAL_TABLET | Freq: Four times a day (QID) | ORAL | Status: DC | PRN

## 2021-09-14 MED ORDER — ONDANSETRON HCL 4 MG/2ML IJ SOLN: 4.0000 mg | Freq: Once | INTRAMUSCULAR | Status: DC | PRN

## 2021-09-14 MED ORDER — HYDROMORPHONE HCL 1 MG/ML IJ SOLN
INTRAMUSCULAR | Status: AC
  Administered 2021-09-14: 0.5 mg via INTRAVENOUS
  Filled 2021-09-14: qty 1

## 2021-09-14 MED ORDER — ORAL CARE MOUTH RINSE: 15.0000 mL | Freq: Once | OROMUCOSAL | Status: AC

## 2021-09-14 MED ORDER — ACETAMINOPHEN 500 MG PO TABS
1000.0000 mg | ORAL_TABLET | ORAL | Status: AC
  Administered 2021-09-14: 1000 mg via ORAL
  Filled 2021-09-14: qty 2

## 2021-09-14 MED ORDER — 0.9 % SODIUM CHLORIDE (POUR BTL) OPTIME
TOPICAL | Status: DC | PRN
  Administered 2021-09-14: 1000 mL

## 2021-09-14 MED ORDER — DIPHENHYDRAMINE HCL 25 MG PO CAPS: 25.0000 mg | ORAL_CAPSULE | Freq: Four times a day (QID) | ORAL | Status: DC | PRN

## 2021-09-14 MED ORDER — DEXMEDETOMIDINE (PRECEDEX) IN NS 20 MCG/5ML (4 MCG/ML) IV SYRINGE
PREFILLED_SYRINGE | INTRAVENOUS | Status: DC | PRN
  Administered 2021-09-14: 12 ug via INTRAVENOUS

## 2021-09-14 MED ORDER — BUPIVACAINE-EPINEPHRINE 0.25% -1:200000 IJ SOLN
INTRAMUSCULAR | Status: DC | PRN
  Administered 2021-09-14: 27 mL

## 2021-09-14 MED ORDER — HYDROMORPHONE HCL 1 MG/ML IJ SOLN
0.2500 mg | INTRAMUSCULAR | Status: DC | PRN
Start: 2021-09-14 — End: 2021-09-14
  Administered 2021-09-14: 0.5 mg via INTRAVENOUS

## 2021-09-14 MED ORDER — SUGAMMADEX SODIUM 200 MG/2ML IV SOLN
INTRAVENOUS | Status: DC | PRN
  Administered 2021-09-14 (×2): 100 mg via INTRAVENOUS

## 2021-09-14 MED ORDER — FENTANYL CITRATE (PF) 250 MCG/5ML IJ SOLN
INTRAMUSCULAR | Status: AC
  Filled 2021-09-14: qty 5

## 2021-09-14 MED ORDER — DIVALPROEX SODIUM ER 500 MG PO TB24
750.0000 mg | ORAL_TABLET | Freq: Every evening | ORAL | Status: DC
  Administered 2021-09-14 – 2021-09-16 (×3): 750 mg via ORAL
  Filled 2021-09-14 (×4): qty 1

## 2021-09-14 MED ORDER — MIDAZOLAM HCL 2 MG/2ML IJ SOLN
INTRAMUSCULAR | Status: AC
  Filled 2021-09-14: qty 2

## 2021-09-14 MED ORDER — SODIUM CHLORIDE 0.9 % IV SOLN
INTRAVENOUS | Status: DC | PRN
  Administered 2021-09-14: 38 mL

## 2021-09-14 MED ORDER — CEFAZOLIN IN SODIUM CHLORIDE 3-0.9 GM/100ML-% IV SOLN
3.0000 g | INTRAVENOUS | Status: DC
  Filled 2021-09-14: qty 100

## 2021-09-14 MED ORDER — KETOROLAC TROMETHAMINE 30 MG/ML IJ SOLN: 30.0000 mg | Freq: Once | INTRAMUSCULAR | Status: AC | PRN

## 2021-09-14 MED ORDER — LURASIDONE HCL 20 MG PO TABS
60.0000 mg | ORAL_TABLET | Freq: Every day | ORAL | Status: DC
  Administered 2021-09-14 – 2021-09-16 (×3): 60 mg via ORAL
  Filled 2021-09-14 (×4): qty 3

## 2021-09-14 MED ORDER — IOHEXOL 300 MG/ML  SOLN
INTRAMUSCULAR | Status: DC | PRN
  Administered 2021-09-14: 19 mL

## 2021-09-14 SURGICAL SUPPLY — 52 items
ADH SKN CLS APL DERMABOND .7 (GAUZE/BANDAGES/DRESSINGS) ×1
APPLIER CLIP 5 13 M/L LIGAMAX5 (MISCELLANEOUS) ×2
APR CLP MED LRG 5 ANG JAW (MISCELLANEOUS) ×1
BAG COUNTER SPONGE SURGICOUNT (BAG) ×2 IMPLANT
BAG SPEC RTRVL LRG 6X4 10 (ENDOMECHANICALS) ×1
BAG SPNG CNTER NS LX DISP (BAG) ×1
BLADE CLIPPER SURG (BLADE) IMPLANT
CANISTER SUCT 3000ML PPV (MISCELLANEOUS) ×2 IMPLANT
CHLORAPREP W/TINT 26 (MISCELLANEOUS) ×2 IMPLANT
CLIP APPLIE 5 13 M/L LIGAMAX5 (MISCELLANEOUS) ×1 IMPLANT
CNTNR URN SCR LID CUP LEK RST (MISCELLANEOUS) ×1 IMPLANT
CONT SPEC 4OZ STRL OR WHT (MISCELLANEOUS) ×2
COVER MAYO STAND STRL (DRAPES) ×2 IMPLANT
COVER SURGICAL LIGHT HANDLE (MISCELLANEOUS) ×2 IMPLANT
DERMABOND ADVANCED (GAUZE/BANDAGES/DRESSINGS) ×1
DERMABOND ADVANCED .7 DNX12 (GAUZE/BANDAGES/DRESSINGS) ×1 IMPLANT
DRAPE C-ARM 42X120 X-RAY (DRAPES) ×2 IMPLANT
ELECT REM PT RETURN 9FT ADLT (ELECTROSURGICAL) ×2
ELECTRODE REM PT RTRN 9FT ADLT (ELECTROSURGICAL) ×1 IMPLANT
ENDOLOOP SUT PDS II  0 18 (SUTURE) ×2
ENDOLOOP SUT PDS II 0 18 (SUTURE) ×1 IMPLANT
GLOVE SURG POLY MICRO LF SZ5.5 (GLOVE) ×2 IMPLANT
GLOVE SURG UNDER POLY LF SZ6 (GLOVE) ×2 IMPLANT
GOWN STRL REUS W/ TWL LRG LVL3 (GOWN DISPOSABLE) ×3 IMPLANT
GOWN STRL REUS W/TWL LRG LVL3 (GOWN DISPOSABLE) ×6
GRASPER SUT TROCAR 14GX15 (MISCELLANEOUS) ×2 IMPLANT
KIT BASIN OR (CUSTOM PROCEDURE TRAY) ×2 IMPLANT
KIT TURNOVER KIT B (KITS) ×2 IMPLANT
L-HOOK LAP DISP 36CM (ELECTROSURGICAL) ×2
LHOOK LAP DISP 36CM (ELECTROSURGICAL) ×1 IMPLANT
NEEDLE INSUFFLATION 14GA 120MM (NEEDLE) IMPLANT
NS IRRIG 1000ML POUR BTL (IV SOLUTION) ×2 IMPLANT
PAD ARMBOARD 7.5X6 YLW CONV (MISCELLANEOUS) ×2 IMPLANT
PENCIL BUTTON HOLSTER BLD 10FT (ELECTRODE) ×2 IMPLANT
POUCH SPECIMEN RETRIEVAL 10MM (ENDOMECHANICALS) ×2 IMPLANT
SCISSORS LAP 5X35 DISP (ENDOMECHANICALS) ×2 IMPLANT
SET CHOLANGIOGRAPH 5 50 .035 (SET/KITS/TRAYS/PACK) ×2 IMPLANT
SET IRRIG TUBING LAPAROSCOPIC (IRRIGATION / IRRIGATOR) ×2 IMPLANT
SET TUBE SMOKE EVAC HIGH FLOW (TUBING) ×2 IMPLANT
SLEEVE ENDOPATH XCEL 5M (ENDOMECHANICALS) ×4 IMPLANT
SPECIMEN JAR SMALL (MISCELLANEOUS) ×2 IMPLANT
SUT MNCRL AB 4-0 PS2 18 (SUTURE) ×2 IMPLANT
SUT VIC AB 3-0 SH 27 (SUTURE)
SUT VIC AB 3-0 SH 27XBRD (SUTURE) IMPLANT
SUT VICRYL 0 UR6 27IN ABS (SUTURE) ×2 IMPLANT
TOWEL GREEN STERILE (TOWEL DISPOSABLE) ×2 IMPLANT
TOWEL GREEN STERILE FF (TOWEL DISPOSABLE) ×2 IMPLANT
TRAY LAPAROSCOPIC MC (CUSTOM PROCEDURE TRAY) ×2 IMPLANT
TROCAR XCEL 12X100 BLDLESS (ENDOMECHANICALS) IMPLANT
TROCAR XCEL BLUNT TIP 100MML (ENDOMECHANICALS) ×2 IMPLANT
TROCAR XCEL NON-BLD 5MMX100MML (ENDOMECHANICALS) ×2 IMPLANT
WATER STERILE IRR 1000ML POUR (IV SOLUTION) ×2 IMPLANT

## 2021-09-14 NOTE — Discharge Instructions (Addendum)
[facility] DISCHARGE INSTRUCTIONS  Activity No heavy lifting greater than 15 pounds for 4 weeks after surgery. Ok to shower in 24 hours, but do not bathe or submerge incisions underwater. Do not drive while taking narcotic pain medication.  Wound Care Your incisions are covered with skin glue called Dermabond. This will peel off on its own over time. You may shower and allow warm soapy water to run over your incisions in 24 hours after surgery. Gently pat dry. Do not submerge your incisions underwater. Monitor your incision for any new redness, tenderness, or drainage.  When to Call us: Fever greater than 100.5 New redness, drainage, or swelling at incision site Severe pain, nausea, or vomiting Jaundice (yellowing of the whites of the eyes or skin)  Follow-up You have an appointment scheduled with Dr. Freida Busman on October 02, 2021 at 9:45am. This will be at the [facility] Surgery office at [address], [place], [place]. Please arrive at least 15 minutes prior to your scheduled appointment time.  You may resume taking your Eliquis (apixiban) in 2 days after discharge (on Tuesday, November 15).  For questions or concerns, please call the office at [phone].

## 2021-09-14 NOTE — Anesthesia Postprocedure Evaluation (Signed)
Anesthesia Post Note  Patient: Regina Morales  Procedure(s) Performed: LAPAROSCOPIC CHOLECYSTECTOMY WITH INTRAOPERATIVE CHOLANGIOGRAM     Patient location during evaluation: PACU Anesthesia Type: General Level of consciousness: awake and alert Pain management: pain level controlled Vital Signs Assessment: post-procedure vital signs reviewed and stable Respiratory status: spontaneous breathing, nonlabored ventilation, respiratory function stable and patient connected to nasal cannula oxygen Cardiovascular status: blood pressure returned to baseline and stable Postop Assessment: no apparent nausea or vomiting Anesthetic complications: no   No notable events documented.  Last Vitals:  Vitals:   09/14/21 1245 09/14/21 1259  BP: 103/67 119/69  Pulse: 78 85  Resp: 13 16  Temp:  36.7 C  SpO2: 96% 91%    Last Pain:  Vitals:   09/14/21 1304  TempSrc:   PainSc: 7                  Trevor Iha

## 2021-09-14 NOTE — Op Note (Signed)
Date: 09/14/21  Patient: Regina Morales MRN: [id number]  Preoperative Diagnosis: Symptomatic cholelithiasis Postoperative Diagnosis: Symptomatic cholelithiasis, possible choledocholithiasis  Procedure: Laparoscopic cholecystectomy with intraoperative cholangiogram  Surgeon: Sophronia Simas, MD  EBL: Minimal  Anesthesia: General endotracheal  Specimens: Gallbladder  Indications: [name] is a 60 year old female who presented with upper abdominal pain and a mild elevation in bilirubin, and was found to have cholelithiasis.  She agreed to proceed with cholecystectomy with intraoperative cholangiogram.  Findings: Cholelithiasis with signs of chronic cholecystitis.  Multiple small stones within the cystic duct with a possible stone within the distal common bile duct, with prompt filling of the duodenum with contrast.  Procedure details: Informed consent was obtained in the preoperative area prior to the procedure. The patient was brought to the operating room and placed on the table in the supine position.  General anesthesia was induced and appropriate lines and drains were placed for intraoperative monitoring. Perioperative antibiotics were administered per SCIP guidelines. The abdomen was prepped and draped in the usual sterile fashion. A pre-procedure timeout was taken verifying patient identity, surgical site and procedure to be performed.  A small infraumbilical skin incision was made, the subcutaneous tissue was divided with cautery, and the umbilical stalk was grasped and elevated.  A Veress needle was inserted through the fascia and intraperitoneal placement was confirmed with a saline drop test.  A 12 mm port was placed, and the peritoneal cavity was inspected with no evidence of visceral or vascular injury. Three 25mm ports were placed in the right subcostal margin, all under direct visualization. The fundus of the gallbladder was grasped and retracted cephalad.  There were omental  adhesions to the gallbladder which were taken down with blunt dissection and cautery.  The infundibulum was retracted laterally. The cystic triangle was dissected out using cautery and blunt dissection, and the critical view of safety was obtained.  The cystic duct was dilated and there was a stone within the cystic duct which was milked up into the gallbladder.  The cystic duct was partially divided sharply, and multiple small stones were milked out of the more distal cystic duct.  A cholangiocatheter was passed into the cystic duct stump via the ductotomy and a cholangiogram was performed with fluoroscopy.  Two runs were performed, and there was filling of the entire common bile duct, common hepatic duct and right and left hepatic ducts.  There was also prompt filling of the duodenum with contrast, however there was a possible filling defect within the very distal common bile duct.  This persisted after flushing the catheter with saline.  The cholangiocatheter was removed, and the cystic duct stump was closed with a PDS Endoloop.  The cystic artery had been divided with cautery and was hemostatic.  The gallbladder was taken off the liver using cautery, and the specimen was placed in an endocatch bag and removed. The surgical site was irrigated with saline until the effluent was clear. Hemostasis was achieved in the gallbladder fossa using cautery. The cystic duct and artery stumps were visually inspected and there was no evidence of bile leak or bleeding.  The ports were removed under direct visualization and the abdomen was desufflated. The umbilical port site fascia was closed with a 0 vicryl suture. The skin at all port sites was closed with 4-0 monocryl subcuticular suture. Dermabond was applied.  The patient tolerated the procedure well with no apparent complications.  All counts were correct x2 at the end of the procedure. The patient was extubated  and taken to PACU in stable condition.  Due to her  equivocal cholangiogram with recent elevation in LFTs, she will be admitted for further evaluation with MRCP.  Michaelle Birks, MD 09/14/21 11:02 AM

## 2021-09-14 NOTE — Progress Notes (Signed)
MRCP reviewed, confirms a small filling defect in the distal CBD. GI consulted for ERCP, will keep NPO after midnight tonight. I discussed plan with the patient at bedside.

## 2021-09-14 NOTE — Progress Notes (Signed)
Patient returned to unit at this time.  No s/s of distress noted.  With moderate complaints of pain 7/10 to Abdomen at this time.  PRN to be given.

## 2021-09-14 NOTE — Progress Notes (Signed)
Patient arrived to 6N27 from PACU. Patient is A+O*4. VSS, NAD. Bed is in the lowest and locked position with the bed rails up times 3. Belongings and call bell within reach. Patient oriented to unit and how to use call bell.

## 2021-09-14 NOTE — Anesthesia Procedure Notes (Signed)
Procedure Name: Intubation Date/Time: 09/14/2021 9:08 AM Performed by: Yehuda Mao, CRNA Pre-anesthesia Checklist: Patient identified, Emergency Drugs available, Patient being monitored, Suction available and Timeout performed Patient Re-evaluated:Patient Re-evaluated prior to induction Oxygen Delivery Method: Circle system utilized Preoxygenation: Pre-oxygenation with 100% oxygen Induction Type: IV induction Ventilation: Mask ventilation without difficulty Laryngoscope Size: Mac and 3 Grade View: Grade I Tube type: Oral Tube size: 7.0 mm Number of attempts: 1 Airway Equipment and Method: Stylet Placement Confirmation: ETT inserted through vocal cords under direct vision Secured at: 22 cm Tube secured with: Tape Comments: Paramedic student present and walked through intubation process-pt consented to participation

## 2021-09-14 NOTE — Transfer of Care (Signed)
Immediate Anesthesia Transfer of Care Note  Patient: Regina Morales  Procedure(s) Performed: LAPAROSCOPIC CHOLECYSTECTOMY WITH INTRAOPERATIVE CHOLANGIOGRAM  Patient Location: PACU  Anesthesia Type:General  Level of Consciousness: drowsy  Airway & Oxygen Therapy: Patient Spontanous Breathing and Patient connected to face mask oxygen  Post-op Assessment: Report given to RN and Post -op Vital signs reviewed and stable  Post vital signs: stable  Last Vitals:  Vitals Value Taken Time  BP 134/57 09/14/21 1044  Temp    Pulse 70 09/14/21 1047  Resp 21 09/14/21 1047  SpO2 100 % 09/14/21 1047  Vitals shown include unvalidated device data.  Last Pain:  Vitals:   09/14/21 0717  TempSrc:   PainSc: 3       Patients Stated Pain Goal: 2 (09/14/21 0717)  Complications: No notable events documented.

## 2021-09-14 NOTE — Interval H&P Note (Signed)
History and Physical Interval Note:  09/14/2021 8:19 AM  Regina Morales  has presented today for surgery, with the diagnosis of GALLSTONES.  The various methods of treatment have been discussed with the patient and family. After consideration of risks, benefits and other options for treatment, the patient has consented to  Procedure(s): LAPAROSCOPIC CHOLECYSTECTOMY WITH INTRAOPERATIVE CHOLANGIOGRAM (N/A) as a surgical intervention.  The patient's history has been reviewed, patient examined, no change in status, stable for surgery.  I have reviewed the patient's chart and labs.  Questions were answered to the patient's satisfaction.  Eliquis has been on hold for 3 days, cleared by cardiologist prior to surgery. Discussed with patient that she may resume Eliquis in 48 hours.   Fritzi Mandes

## 2021-09-15 ENCOUNTER — Inpatient Hospital Stay (HOSPITAL_COMMUNITY): Payer: BC Managed Care – PPO

## 2021-09-15 ENCOUNTER — Inpatient Hospital Stay (HOSPITAL_COMMUNITY): Payer: BC Managed Care – PPO | Admitting: Certified Registered Nurse Anesthetist

## 2021-09-15 ENCOUNTER — Encounter (HOSPITAL_COMMUNITY)
Admission: RE | Disposition: A | Discharge status: Home / Self Care | Payer: Self-pay | Source: Ambulatory Visit | Attending: Surgery

## 2021-09-15 ENCOUNTER — Encounter (HOSPITAL_COMMUNITY): Payer: Self-pay | Admitting: Surgery

## 2021-09-15 DIAGNOSIS — G4733 Obstructive sleep apnea (adult) (pediatric): Secondary | ICD-10-CM | POA: Diagnosis present

## 2021-09-15 DIAGNOSIS — Y836 Removal of other organ (partial) (total) as the cause of abnormal reaction of the patient, or of later complication, without mention of misadventure at the time of the procedure: Secondary | ICD-10-CM | POA: Diagnosis present

## 2021-09-15 DIAGNOSIS — K8050 Calculus of bile duct without cholangitis or cholecystitis without obstruction: Secondary | ICD-10-CM | POA: Diagnosis not present

## 2021-09-15 DIAGNOSIS — Z841 Family history of disorders of kidney and ureter: Secondary | ICD-10-CM | POA: Diagnosis not present

## 2021-09-15 DIAGNOSIS — Z801 Family history of malignant neoplasm of trachea, bronchus and lung: Secondary | ICD-10-CM | POA: Diagnosis not present

## 2021-09-15 DIAGNOSIS — K8064 Calculus of gallbladder and bile duct with chronic cholecystitis without obstruction: Secondary | ICD-10-CM | POA: Diagnosis present

## 2021-09-15 DIAGNOSIS — F32A Depression, unspecified: Secondary | ICD-10-CM | POA: Diagnosis present

## 2021-09-15 DIAGNOSIS — G43909 Migraine, unspecified, not intractable, without status migrainosus: Secondary | ICD-10-CM | POA: Diagnosis present

## 2021-09-15 DIAGNOSIS — J449 Chronic obstructive pulmonary disease, unspecified: Secondary | ICD-10-CM | POA: Diagnosis present

## 2021-09-15 DIAGNOSIS — Z8249 Family history of ischemic heart disease and other diseases of the circulatory system: Secondary | ICD-10-CM | POA: Diagnosis not present

## 2021-09-15 DIAGNOSIS — Z8241 Family history of sudden cardiac death: Secondary | ICD-10-CM | POA: Diagnosis not present

## 2021-09-15 DIAGNOSIS — Z6841 Body Mass Index (BMI) 40.0 and over, adult: Secondary | ICD-10-CM | POA: Diagnosis not present

## 2021-09-15 DIAGNOSIS — Z87891 Personal history of nicotine dependence: Secondary | ICD-10-CM | POA: Diagnosis not present

## 2021-09-15 DIAGNOSIS — Z86711 Personal history of pulmonary embolism: Secondary | ICD-10-CM | POA: Diagnosis not present

## 2021-09-15 DIAGNOSIS — I10 Essential (primary) hypertension: Secondary | ICD-10-CM | POA: Diagnosis present

## 2021-09-15 DIAGNOSIS — K219 Gastro-esophageal reflux disease without esophagitis: Secondary | ICD-10-CM | POA: Diagnosis present

## 2021-09-15 DIAGNOSIS — K9186 Retained cholelithiasis following cholecystectomy: Secondary | ICD-10-CM | POA: Diagnosis present

## 2021-09-15 DIAGNOSIS — Z7901 Long term (current) use of anticoagulants: Secondary | ICD-10-CM | POA: Diagnosis not present

## 2021-09-15 DIAGNOSIS — Z86718 Personal history of other venous thrombosis and embolism: Secondary | ICD-10-CM | POA: Diagnosis not present

## 2021-09-15 DIAGNOSIS — K8011 Calculus of gallbladder with chronic cholecystitis with obstruction: Secondary | ICD-10-CM | POA: Diagnosis present

## 2021-09-15 HISTORY — PX: SPHINCTEROTOMY: SHX5544

## 2021-09-15 HISTORY — PX: REMOVAL OF STONES: SHX5545

## 2021-09-15 HISTORY — PX: ERCP: SHX5425

## 2021-09-15 SURGERY — ERCP, WITH INTERVENTION IF INDICATED
Anesthesia: General

## 2021-09-15 MED ORDER — LIDOCAINE 2% (20 MG/ML) 5 ML SYRINGE
INTRAMUSCULAR | Status: DC | PRN
  Administered 2021-09-15: 60 mg via INTRAVENOUS

## 2021-09-15 MED ORDER — CIPROFLOXACIN IN D5W 400 MG/200ML IV SOLN
INTRAVENOUS | Status: AC
  Filled 2021-09-15: qty 200

## 2021-09-15 MED ORDER — MIDAZOLAM HCL 2 MG/2ML IJ SOLN
INTRAMUSCULAR | Status: AC
  Filled 2021-09-15: qty 2

## 2021-09-15 MED ORDER — INDOMETHACIN 50 MG RE SUPP
100.0000 mg | Freq: Once | RECTAL | Status: DC
  Filled 2021-09-15: qty 2

## 2021-09-15 MED ORDER — FENTANYL CITRATE (PF) 100 MCG/2ML IJ SOLN
INTRAMUSCULAR | Status: AC
  Filled 2021-09-15: qty 2

## 2021-09-15 MED ORDER — PHENYLEPHRINE HCL (PRESSORS) 10 MG/ML IV SOLN
INTRAVENOUS | Status: DC | PRN
  Administered 2021-09-15 (×2): 80 ug via INTRAVENOUS

## 2021-09-15 MED ORDER — DEXAMETHASONE SODIUM PHOSPHATE 10 MG/ML IJ SOLN
INTRAMUSCULAR | Status: DC | PRN
  Administered 2021-09-15: 10 mg via INTRAVENOUS

## 2021-09-15 MED ORDER — SODIUM CHLORIDE 0.9 % IV SOLN: INTRAVENOUS | Status: DC

## 2021-09-15 MED ORDER — SODIUM CHLORIDE 0.9 % IV SOLN
INTRAVENOUS | Status: DC | PRN
  Administered 2021-09-15: 1.5 g via INTRAVENOUS

## 2021-09-15 MED ORDER — SUGAMMADEX SODIUM 200 MG/2ML IV SOLN
INTRAVENOUS | Status: DC | PRN
  Administered 2021-09-15: 511.6 mg via INTRAVENOUS

## 2021-09-15 MED ORDER — ONDANSETRON HCL 4 MG/2ML IJ SOLN
INTRAMUSCULAR | Status: DC | PRN
  Administered 2021-09-15: 4 mg via INTRAVENOUS

## 2021-09-15 MED ORDER — LACTATED RINGERS IV SOLN
INTRAVENOUS | Status: DC | PRN
Start: 2021-09-15 — End: 2021-09-15

## 2021-09-15 MED ORDER — MIDAZOLAM HCL 2 MG/2ML IJ SOLN
INTRAMUSCULAR | Status: DC | PRN
  Administered 2021-09-15: 2 mg via INTRAVENOUS

## 2021-09-15 MED ORDER — ROCURONIUM BROMIDE 10 MG/ML (PF) SYRINGE
PREFILLED_SYRINGE | INTRAVENOUS | Status: DC | PRN
  Administered 2021-09-15: 100 mg via INTRAVENOUS

## 2021-09-15 MED ORDER — SODIUM CHLORIDE 0.9 % IV SOLN
1.5000 g | INTRAVENOUS | Status: DC
  Filled 2021-09-15: qty 4

## 2021-09-15 MED ORDER — SODIUM CHLORIDE 0.9 % IV SOLN
INTRAVENOUS | Status: DC | PRN
  Administered 2021-09-15: 30 mL

## 2021-09-15 MED ORDER — PROPOFOL 10 MG/ML IV BOLUS
INTRAVENOUS | Status: DC | PRN
  Administered 2021-09-15: 130 mg via INTRAVENOUS

## 2021-09-15 MED ORDER — FENTANYL CITRATE (PF) 250 MCG/5ML IJ SOLN
INTRAMUSCULAR | Status: DC | PRN
  Administered 2021-09-15: 100 ug via INTRAVENOUS

## 2021-09-15 NOTE — Progress Notes (Signed)
    Patient seen.  Anticipate ERCP later today by Dr. Ewing Schlein - patient is an Regina Morales patient   Full note will follow  Iva Boop, MD, [facility] 09/15/2021 8:08 AM

## 2021-09-15 NOTE — Anesthesia Postprocedure Evaluation (Signed)
Anesthesia Post Note  Patient: Shalay A [place]  Procedure(s) Performed: ENDOSCOPIC RETROGRADE CHOLANGIOPANCREATOGRAPHY (ERCP) SPHINCTEROTOMY REMOVAL OF STONES     Patient location during evaluation: Endoscopy Anesthesia Type: General Level of consciousness: patient cooperative and awake Pain management: pain level controlled Vital Signs Assessment: post-procedure vital signs reviewed and stable Respiratory status: spontaneous breathing, nonlabored ventilation, respiratory function stable and patient connected to nasal cannula oxygen Cardiovascular status: blood pressure returned to baseline and stable Postop Assessment: no apparent nausea or vomiting Anesthetic complications: no   No notable events documented.  Last Vitals:  Vitals:   09/15/21 1535 09/15/21 1600  BP: 124/62 121/65  Pulse: 66 65  Resp: 15 16  Temp:  36.8 C  SpO2: 93% 96%    Last Pain:  Vitals:   09/15/21 1600  TempSrc: Oral  PainSc:                  Donta Fuster

## 2021-09-15 NOTE — Anesthesia Preprocedure Evaluation (Signed)
Anesthesia Evaluation  Patient identified by MRN, date of birth, ID band Patient awake    Reviewed: Allergy & Precautions, NPO status , Patient's Chart, lab work & pertinent test results, reviewed documented beta blocker date and time   History of Anesthesia Complications Negative for: history of anesthetic complications  Airway Mallampati: III  TM Distance: >3 FB Neck ROM: Full    Dental no notable dental hx. (+) Edentulous Upper, Dental Advisory Given   Pulmonary sleep apnea and Continuous Positive Airway Pressure Ventilation , COPD, former smoker,    breath sounds clear to auscultation       Cardiovascular hypertension, Pt. on medications and Pt. on home beta blockers + DVT  + dysrhythmias Atrial Fibrillation  Rhythm:Regular Rate:Normal  06/2020 TTE 1. Left ventricular ejection fraction, by estimation, is 55 to 60%. The left ventricle has normal function. The left ventricle has no regional wall motion abnormalities. Left ventricular diastolic parameters were normal. 2. Right ventricular systolic function is normal. The right ventricular size is normal. Tricuspid regurgitation signal is inadequate for assessing PA pressure. 3. The mitral valve is normal in structure. Trivial mitral valve regurgitation. No evidence of mitral stenosis. 4. The aortic valve is tricuspid. Aortic valve regurgitation is not visualized. No aortic stenosis is present.   Neuro/Psych  Headaches, PSYCHIATRIC DISORDERS Bipolar Disorder tremors   GI/Hepatic Neg liver ROS, GERD  ,  Endo/Other  negative endocrine ROSMorbid obesity (BMI 42.94)  Renal/GU negative Renal ROS     Musculoskeletal  (+) Arthritis , Fibromyalgia -  Abdominal (+) + obese,   Peds  Hematology  (+) Blood dyscrasia, anemia , Lab Results      Component                Value               Date                      WBC                      6.9                 09/06/2021                 HGB                      11.6 (L)            09/06/2021                HCT                      36.1                09/06/2021                MCV                      93.0                09/06/2021                PLT                      193                 09/06/2021  Anesthesia Other Findings All: codeine  Reproductive/Obstetrics                             Anesthesia Physical Anesthesia Plan  ASA: 3  Anesthesia Plan: General   Post-op Pain Management:    Induction: Intravenous  PONV Risk Score and Plan: 3 and Ondansetron and Dexamethasone  Airway Management Planned: Oral ETT  Additional Equipment: None  Intra-op Plan:   Post-operative Plan: Extubation in OR  Informed Consent: I have reviewed the patients History and Physical, chart, labs and discussed the procedure including the risks, benefits and alternatives for the proposed anesthesia with the patient or authorized representative who has indicated his/her understanding and acceptance.     Dental advisory given  Plan Discussed with: CRNA and Anesthesiologist  Anesthesia Plan Comments:         Anesthesia Quick Evaluation

## 2021-09-15 NOTE — Anesthesia Procedure Notes (Signed)
Procedure Name: Intubation Date/Time: 09/15/2021 2:09 PM Performed by: Clearnce Sorrel, CRNA Pre-anesthesia Checklist: Patient identified, Emergency Drugs available, Suction available and Patient being monitored Patient Re-evaluated:Patient Re-evaluated prior to induction Oxygen Delivery Method: Circle System Utilized Preoxygenation: Pre-oxygenation with 100% oxygen Induction Type: IV induction Ventilation: Mask ventilation without difficulty Laryngoscope Size: Mac and 3 Grade View: Grade I Tube type: Oral Tube size: 7.0 mm Number of attempts: 1 Airway Equipment and Method: Stylet and Oral airway Placement Confirmation: ETT inserted through vocal cords under direct vision, positive ETCO2 and breath sounds checked- equal and bilateral Secured at: 23 cm Tube secured with: Tape Dental Injury: Teeth and Oropharynx as per pre-operative assessment

## 2021-09-15 NOTE — Progress Notes (Signed)
Regina Morales 2:04 PM  Subjective: Patient seen and examined in her hospital computer chart reviewed and her case discussed with Dr. Leone Payor and currently she is doing fine after surgery yesterday and has no new complaints  Objective: No signs stable afebrile no acute distress exam please see preassessment evaluation no new labs today chemistries yesterday okay MRI and Intra-Op cholangiogram reviewed  Assessment: Probable CBD stone  Plan: The risk benefits methods and success rate was rediscussed with the patient and will proceed today with anesthesia assistance with further work-up and plans pending those findings  [facility]  office [phone] After 5PM or if no answer call [phone]

## 2021-09-15 NOTE — Op Note (Signed)
[facility] Patient Name: Regina Morales Procedure Date : 09/15/2021 MRN: [id number] Attending MD: Clarene Essex , MD Date of Birth: Jan 08, 1961 CSN: [id number] Age: 60 Admit Type: Inpatient Procedure:                ERCP Indications:              For therapy of bile duct stone(s) Providers:                Clarene Essex, MD, Carlyn Reichert, RN, Frazier Richards,                            Technician Referring MD:              Medicines:                General Anesthesia Complications:            No immediate complications. Estimated Blood Loss:     Estimated blood loss was minimal. Procedure:                Pre-Anesthesia Assessment:                           - Prior to the procedure, a History and Physical                            was performed, and patient medications and                            allergies were reviewed. The patient's tolerance of                            previous anesthesia was also reviewed. The risks                            and benefits of the procedure and the sedation                            options and risks were discussed with the patient.                            All questions were answered, and informed consent                            was obtained. Prior Anticoagulants: The patient has                            taken Eliquis (apixaban), last dose was 4 days                            prior to procedure. ASA Grade Assessment: III - A                            patient with severe systemic disease. After  reviewing the risks and benefits, the patient was                            deemed in satisfactory condition to undergo the                            procedure.                           After obtaining informed consent, the scope was                            passed under direct vision. Throughout the                            procedure, the patient's blood pressure, pulse, and                             oxygen saturations were monitored continuously. The                            [id number]) Olympus duodenoscope was                            introduced through the mouth, and used to inject                            contrast into and used to cannulate the bile duct.                            The ERCP was accomplished without difficulty. The                            patient tolerated the procedure well. Scope In: Scope Out: Findings:      The major papilla was slightly bulging. Deep selective cannulation was       readily obtained and probably a small stone was seen on initial       cholangiogram and we proceeded with a biliary sphincterotomy which was       made with a Hydratome sphincterotome using ERBE electrocautery. There       was no post-sphincterotomy bleeding. We proceeded until we had adequate       biliary drainage and could get the fully bowed sphincterotome easily in       and out of the duct and to discover objects, the biliary tree was swept       with a 12 mm balloon starting at the bifurcation. Sludge was swept from       the duct. All stones were removed. At least 1 stone was seen to pass       through the duct but dropped down the duodenum before a picture was       obtained and on multiple balloon pull-through's nothing was found. The       12 mm balloon passed readily through the patent sphincterotomy site and       on occlusion cholangiogram there was no residual abnormalities and there  was adequate biliary drainage and not mentioned above throughout the       procedure there was no pancreatic duct injection or wire advancement and       there was minimal oozing after we pulled the balloon through the last       time which stopped readily with washing and the scope was removed and       the patient tolerated the procedure well Impression:               - The major papilla appeared to be slightly bulging.                           -  Choledocholithiasis was found. Complete removal                            was accomplished by biliary sphincterotomy and                            balloon extraction.                           - A biliary sphincterotomy was performed.                           - The biliary tree was swept and nothing was found                            at the end of the procedure. Recommendation:           - Clear liquid diet for 6 hours. May have soft                            solids either later tonight or in a.m. and                            hopefully will be able to be discharged tomorrow                           - Continue present medications.                           - Resume Eliquis (apixaban) at prior dose in 3 days.                           - Return to GI clinic PRN. We will recheck labs in                            a.m. and will check on tomorrow                           - Telephone GI clinic if symptomatic PRN. Procedure Code(s):        --- Professional ---                           [phone], Endoscopic retrograde  cholangiopancreatography (ERCP); with removal of                            calculi/debris from biliary/pancreatic duct(s)                           [id number], Endoscopic retrograde                            cholangiopancreatography (ERCP); with                            sphincterotomy/papillotomy Diagnosis Code(s):        --- Professional ---                           K80.50, Calculus of bile duct without cholangitis                            or cholecystitis without obstruction                           K83.8, Other specified diseases of biliary tract CPT copyright 2019 [employer]. All rights reserved. The codes documented in this report are preliminary and upon coder review may  be revised to meet current compliance requirements. Clarene Essex, MD 09/15/2021 2:54:55 PM This report has been signed electronically. Number of Addenda:  0

## 2021-09-15 NOTE — Transfer of Care (Signed)
Immediate Anesthesia Transfer of Care Note  Patient: Regina Morales  Procedure(s) Performed: ENDOSCOPIC RETROGRADE CHOLANGIOPANCREATOGRAPHY (ERCP) SPHINCTEROTOMY REMOVAL OF STONES  Patient Location: Endoscopy Unit  Anesthesia Type:General  Level of Consciousness: awake, alert  and oriented  Airway & Oxygen Therapy: Patient Spontanous Breathing  Post-op Assessment: Report given to RN and Post -op Vital signs reviewed and stable  Post vital signs: Reviewed and stable  Last Vitals:  Vitals Value Taken Time  BP    Temp    Pulse    Resp    SpO2      Last Pain:  Vitals:   09/15/21 1353  TempSrc: Temporal  PainSc: 0-No pain      Patients Stated Pain Goal: 3 (09/14/21 1200)  Complications: No notable events documented.

## 2021-09-15 NOTE — Progress Notes (Signed)
1 Day Post-Op  Subjective: No acute issues overnight. Pain control improved. No nausea or vomiting.   Objective: Vital signs in last 24 hours: Temp:  [97.3 F (36.3 C)-98.4 F (36.9 C)] 98.2 F (36.8 C) (11/12 0437) Pulse Rate:  [73-92] 76 (11/12 0437) Resp:  [13-24] 17 (11/11 1701) BP: (103-160)/(57-87) 110/69 (11/12 0437) SpO2:  [91 %-100 %] 96 % (11/12 0437) Weight:  [127.9 kg] 127.9 kg (11/11 0704) Last BM Date: 09/13/21  Intake/Output from previous day: 11/11 0701 - 11/12 0700 In: 1338.8 [I.V.:1288.8; IV Piggyback:50] Out: 15 [Blood:15] Intake/Output this shift: No intake/output data recorded.  PE: General: resting comfortably, NAD Neuro: alert and oriented, no focal deficits Resp: normal work of breathing on room air Abdomen: soft, nondistended, nontender to palpation. Incisions clean and dry, mild ecchymosis at umbilical incision, no induration or drainage. Extremities: warm and well-perfused   Lab Results:  No results for input(s): WBC, HGB, HCT, PLT in the last 72 hours. BMET Recent Labs    09/14/21 1308  NA 137  K 4.5  CL 105  CO2 21*  GLUCOSE 117*  BUN 17  CREATININE 0.86  CALCIUM 8.8*   PT/INR No results for input(s): LABPROT, INR in the last 72 hours. CMP     Component Value Date/Time   NA 137 09/14/2021 1308   NA 141 04/13/2020 0000   K 4.5 09/14/2021 1308   CL 105 09/14/2021 1308   CO2 21 (L) 09/14/2021 1308   GLUCOSE 117 (H) 09/14/2021 1308   BUN 17 09/14/2021 1308   BUN 9 04/13/2020 0000   CREATININE 0.86 09/14/2021 1308   CALCIUM 8.8 (L) 09/14/2021 1308   PROT 6.6 09/14/2021 1308   ALBUMIN 3.5 09/14/2021 1308   AST 40 09/14/2021 1308   ALT 32 09/14/2021 1308   ALKPHOS 68 09/14/2021 1308   BILITOT 0.4 09/14/2021 1308   GFRNONAA >60 09/14/2021 1308   GFRAA >60 05/02/2020 0638   Lipase     Component Value Date/Time   LIPASE 41 06/02/2021 1044       Studies/Results: DG Cholangiogram Operative  Result Date:  09/14/2021 CLINICAL DATA:  Intraoperative cholangiogram during laparoscopic cholecystectomy. EXAM: INTRAOPERATIVE CHOLANGIOGRAM FLUOROSCOPY TIME:  21 seconds (11.7 mGy) COMPARISON:  Right upper quadrant abdominal ultrasound-07/29/2021 FINDINGS: Intraoperative cholangiographic images of the right upper abdominal quadrant during laparoscopic cholecystectomy are provided for review. There is attempted cannulation of the cystic duct with extravasation of contrast about the cannulation site. There is however opacification of the CBD with passage of contrast to the level of the duodenum. There is a persistent nonocclusive lentiform shaped filling defect within the distal aspect of the CBD worrisome for nonocclusive choledocholithiasis (representative image 33, series 2). Multiple opacities overlies expected location of the gallbladder lumen compatible with known cholelithiasis. IMPRESSION: Limited intraoperative cholangiogram demonstrates findings worrisome for nonocclusive choledocholithiasis. Further evaluation and management with ERCP could be performed as indicated. Electronically Signed   By: Simonne Come M.D.   On: 09/14/2021 10:05   MR ABDOMEN MRCP W WO CONTAST  Result Date: 09/14/2021 CLINICAL DATA:  Evaluate for choledocholithiasis. EXAM: MRI ABDOMEN WITHOUT AND WITH CONTRAST (INCLUDING MRCP) TECHNIQUE: Multiplanar multisequence MR imaging of the abdomen was performed both before and after the administration of intravenous contrast. Heavily T2-weighted images of the biliary and pancreatic ducts were obtained, and three-dimensional MRCP images were rendered by post processing. CONTRAST:  29mL GADAVIST GADOBUTROL 1 MMOL/ML IV SOLN COMPARISON:  CT AP 06/02/2021 FINDINGS: Lower chest: Atelectasis is noted within the  lung bases. Hepatobiliary: There is no focal liver abnormality. Diffuse hepatic steatosis. Postop change from cholecystectomy. Exam detail is diminished due to motion artifact limiting assessment for  presence or absence of choledocholithiasis. The common bile duct measures 7 mm in maximum diameter. There is a suggestion of a small filling defect within the distal CBD on the axial images only measuring 3 mm, image 28/5 and image 28/6. This cannot be confirmed on the motion limited MRCP and coronal T2 weighted images. Pancreas: No mass, inflammatory changes, or other parenchymal abnormality identified. Spleen: The spleen measures 13.9 by 11.0 x 4.9 cm (volume = 390 cm^3), image 39/18 Adrenals/Urinary Tract: Normal adrenal glands. No kidney mass or hydronephrosis. Stomach/Bowel: Visualized portions within the abdomen are unremarkable. Vascular/Lymphatic: No pathologically enlarged lymph nodes identified. No abdominal aortic aneurysm demonstrated. Other:  No ascites or focal fluid collections. Musculoskeletal: No suspicious bone lesions identified. IMPRESSION: 1. Exam detail is diminished due to motion artifact limiting assessment for presence or absence of choledocholithiasis. 2. The common bile duct measures 7 mm in maximum diameter. There is a suggestion of a small filling defect within the distal CBD on the axial images only measuring 3 mm. This cannot be confirmed on the motion limited MRCP and coronal T2 weighted images. Small distal common bile duct stone therefore cannot be excluded. 3. Diffuse hepatic steatosis. 4. Mild splenomegaly. Electronically Signed   By: Kerby Moors M.D.   On: 09/14/2021 16:39       Assessment/Plan 60 yo female POD1 s/p laparoscopic cholecystectomy with possible CBD stone on IOC, confirmed with MRCP. - GI consulted for ERCP - NPO for possible procedure today - Pain control: prn oxycodone, scheduled tylenol and toradol - Home medications ordered - VTE: SCDs - Dispo: med-surg floor    LOS: 0 days    Michaelle Birks, MD [facility] General, Hepatobiliary and Pancreatic Surgery 09/15/21 6:47 AM

## 2021-09-15 NOTE — Consult Note (Signed)
Consultation  Referring Provider:     Dr. Zenia Resides Primary Care Physician:  Haydee Salter, MD Primary Gastroenterologist:     [facility]    Reason for Consultation:     Common bile duct stone     Impression / Plan:   Suspected choledocholithiasis after laparoscopic cholecystectomy  Atrial fibrillation and remote history of pulmonary embolism on Eliquis currently held  Plan for ERCP with biliary sphincterotomy and removal of common duct stone today.  Dr. Watt Climes will perform this procedure.  The patient is in [place] primary care patient.  I have reviewed risks benefits and indications of the procedure including possibility of pancreatitis in 3 to 5% of cases, bleeding, infection, reactions to medication and the potential need for surgery for some complications.          HPI:   Regina Morales is a 60 y.o. female who underwent laparoscopic cholecystectomy yesterday for symptomatic cholelithiasis, Dr. Michaelle Birks performed the procedure.  At the time of the procedure though contrast was flowing into the duodenum there was a suspicion of a distal CBD filling defect.  MRCP was ordered and shows a 7 mm maximum diameter CBD and a suggestion of a small filling defect approximately 3 mm in the distal CBD.  Diffuse hepatic steatosis and mild splenomegaly as well.  The patient has abdominal soreness she says, she is tender at her incision sites.  She thinks that she may have had some pain similar to what she had that led to her presentation the other month.  LFTs are normal.  No other GI history known though she had a screening colonoscopy through [facility] gastroenterology and her primary care provider is in [place].  She has a history of atrial fibrillation and her Eliquis is currently being held related to surgery.  Remote history of DVT and PE.  Past Medical History:  Diagnosis Date  . Anemia    hx years ago  . Anxiety    years ago  . Buzzing in ear    "right; even after OR"  .  Chest pain    a. 2009: neg MV  ([facility])  . COPD (chronic obstructive pulmonary disease) ([place]) 2018  . Depression   . DVT (deep vein thrombosis) in pregnancy 1980's   LLE  . Fibromyalgia    "dx'd many many years ago; I haven't had any problems w/it" (04/15/2017)  . GERD (gastroesophageal reflux disease)   . Hepatic steatosis   . High cholesterol   . History of blood transfusion    "low count after appendix surgery" unsure # of units transfused  . Hypertension   . Migraines    "maybe once/2 months" (04/15/2017)  . Obesity   . OSA on CPAP   . Persistent atrial fibrillation ([place])    a. Dx 07/2013;  b. CHA2DS2VASc=1 (female);  c. 07/2013 Echo:  EF 50-55%, no rwma, mod LVH.  . Pulmonary embolism ([place]) ~ 1984   "after I had my last child"  . SVD (spontaneous vaginal delivery)    x 4    Past Surgical History:  Procedure Laterality Date  . APPENDECTOMY  1990's  . BREAST CYST ASPIRATION Left 2019   @ [facility]  . CHOLECYSTECTOMY N/A 09/14/2021   Procedure: LAPAROSCOPIC CHOLECYSTECTOMY WITH INTRAOPERATIVE CHOLANGIOGRAM;  Surgeon: Dwan Bolt, MD;  Location: [place] [facility];  Service: General;  Laterality: N/A;  . COLONOSCOPY    . DILATATION & CURETTAGE/HYSTEROSCOPY WITH MYOSURE N/A 04/14/2017   Procedure: DILATATION &  CURETTAGE/HYSTEROSCOPY WITH MYOSURE;  Surgeon: Janyth Pupa, DO;  Location: [facility];  Service: Gynecology;  Laterality: N/A;  Polypectomy  . DILATION AND CURETTAGE OF UTERUS  1983   [facility]  . INNER EAR SURGERY Right 2000's  . LEFT HEART CATH AND CORONARY ANGIOGRAPHY N/A 04/17/2017   Procedure: Left Heart Cath and Coronary Angiography;  Surgeon: Jettie Booze, MD;  Location: [facility];  Service: Cardiovascular;  Laterality: N/A;  . PLANTAR FASCIA RELEASE Right 07/2015  . TUBAL LIGATION  1985  . ULNAR NERVE TRANSPOSITION Right     Family History  Problem Relation Age of Onset  . Lung cancer Father   . Alcohol abuse Father   . Kidney failure Mother        s/p renal  Tx  . Congestive Heart Failure Mother   . Atrial fibrillation Brother   . Sudden Cardiac Death Daughter        cardiac arrest  . Cardiomyopathy Daughter   . Gunter White syndrome Grandchild         granddaughter     Social History   Tobacco Use  . Smoking status: Former    Packs/day: 1.00    Years: 8.00    Pack years: 8.00    Types: Cigarettes    Quit date: 11/04/1990    Years since quitting: 30.8  . Smokeless tobacco: Never  Vaping Use  . Vaping Use: Never used  Substance Use Topics  . Alcohol use: Not Currently  . Drug use: No    Prior to Admission medications   Medication Sig Start Date End Date Taking? Authorizing Provider  apixaban (ELIQUIS) 5 MG TABS tablet Take 1 tablet (5 mg total) by mouth 2 (two) times daily. 03/15/21  Yes Camnitz, Will Hassell Done, MD  divalproex (DEPAKOTE ER) 250 MG 24 hr tablet Take 250 mg by mouth See admin instructions. Take with 500 mg for a total on 750 mg in the evening   Yes [provider]  divalproex (DEPAKOTE ER) 500 MG 24 hr tablet Take 500 mg by mouth See admin instructions. Take with 250 mg for a total of 750 mg in the evening 05/04/21  Yes [provider]  dofetilide (TIKOSYN) 500 MCG capsule TAKE 1 CAPSULE(500 MCG) BY MOUTH TWICE DAILY 07/16/21  Yes Camnitz, Will Hassell Done, MD  DULoxetine (CYMBALTA) 60 MG capsule Take 120 mg by mouth daily.   Yes [provider]  HYDROcodone-acetaminophen (NORCO/VICODIN) 5-325 MG tablet Take 1 tablet by mouth every 4 (four) hours as needed. 07/29/21  Yes Isla Pence, MD  LATUDA 60 MG TABS Take 60 mg by mouth at bedtime. 07/18/21  Yes [provider]  metoprolol tartrate (LOPRESSOR) 50 MG tablet TAKE 1 TABLET TWICE A DAY 05/31/21  Yes Camnitz, Will Hassell Done, MD  mirabegron ER (MYRBETRIQ) 50 MG TB24 tablet Take 50 mg by mouth daily.   Yes [provider]  ondansetron (ZOFRAN ODT) 4 MG disintegrating tablet Take 1 tablet (4 mg total) by mouth every 8 (eight) hours  as needed for nausea or vomiting. 07/29/21  Yes Isla Pence, MD  rizatriptan (MAXALT) 10 MG tablet Take 10 mg by mouth as needed for migraine. May repeat in 2 hours if needed   Yes [provider]  topiramate (TOPAMAX) 50 MG tablet Take 50 mg by mouth 2 (two) times daily. 08/31/20  Yes [provider]  albuterol (VENTOLIN HFA) 108 (90 Base) MCG/ACT inhaler Inhale 2 puffs into the lungs every 6 (six) hours as  needed for wheezing or shortness of breath.    [provider]    Current Facility-Administered Medications  Medication Dose Route Frequency Provider Last Rate Last Admin  . 0.9 %  sodium chloride infusion   Intravenous Continuous Gatha Mayer, MD      . acetaminophen (TYLENOL) tablet 1,000 mg  1,000 mg Oral TID Dwan Bolt, MD   1,000 mg at 09/15/21 0903  . albuterol (PROVENTIL) (2.5 MG/3ML) 0.083% nebulizer solution 2.5 mg  2.5 mg Inhalation Q6H PRN Dwan Bolt, MD      . ampicillin-sulbactam (UNASYN) 1.5 g in sodium chloride 0.9 % 100 mL IVPB  1.5 g Intravenous To SS-Surg Gatha Mayer, MD      . diphenhydrAMINE (BENADRYL) capsule 25 mg  25 mg Oral Q6H PRN Dwan Bolt, MD       Or  . diphenhydrAMINE (BENADRYL) injection 25 mg  25 mg Intravenous Q6H PRN Dwan Bolt, MD      . divalproex (DEPAKOTE ER) 24 hr tablet 750 mg  750 mg Oral QPM Dwan Bolt, MD   750 mg at 09/14/21 1746  . docusate sodium (COLACE) capsule 100 mg  100 mg Oral BID Dwan Bolt, MD   100 mg at 09/15/21 [id number]  . dofetilide (TIKOSYN) capsule 500 mcg  500 mcg Oral BID Dwan Bolt, MD   500 mcg at 09/15/21 [id number]  . DULoxetine (CYMBALTA) DR capsule 120 mg  120 mg Oral Daily Dwan Bolt, MD   120 mg at 09/15/21 0903  . ketorolac (TORADOL) 15 MG/ML injection 15 mg  15 mg Intravenous Q8H Dwan Bolt, MD   15 mg at 09/15/21 0547  . lurasidone (LATUDA) tablet 60 mg  60 mg Oral QHS Dwan Bolt, MD   60 mg at 09/14/21 2056  . metoprolol tartrate (LOPRESSOR)  tablet 50 mg  50 mg Oral BID Dwan Bolt, MD   50 mg at 09/15/21 0903  . mirabegron ER (MYRBETRIQ) tablet 50 mg  50 mg Oral Daily Dwan Bolt, MD   50 mg at 09/15/21 0903  . ondansetron (ZOFRAN-ODT) disintegrating tablet 4 mg  4 mg Oral Q6H PRN Dwan Bolt, MD       Or  . ondansetron [facility]) injection 4 mg  4 mg Intravenous Q6H PRN Michaelle Birks L, MD      . oxyCODONE (Oxy IR/ROXICODONE) immediate release tablet 5 mg  5 mg Oral Q4H PRN Dwan Bolt, MD   5 mg at 09/15/21 0547    Allergies as of 09/03/2021 - Review Complete 07/29/2021  Allergen Reaction Noted  . Codeine Nausea And Vomiting, Other (See Comments), and Nausea Only 07/14/2013     Review of Systems:    This is positive for those things mentioned in the HPI. All other review of systems are negative.       Physical Exam:  Vital signs in last 24 hours: Temp:  [97.3 F (36.3 C)-98.3 F (36.8 C)] 97.8 F (36.6 C) (11/12 0752) Pulse Rate:  [73-92] 76 (11/12 0752) Resp:  [13-24] 18 (11/12 0752) BP: (103-141)/(57-80) 105/67 (11/12 0752) SpO2:  [91 %-100 %] 98 % (11/12 0752) Last BM Date: 09/13/21  General:  Obese and somewhat chronically ill-appearing, white woman.  Well-developed, well-nourished and in no acute distress Eyes:  anicteric. Lungs: Clear to auscultation bilaterally. Heart:   S1S2, no rubs, murmurs, gallops. Abdomen: Obese soft, appropriately tender at incisions Skin   no rash positive  tattoos. Neuro:  A&O x 3.  Psych:  appropriate mood and  Affect.   Data Reviewed:   LAB RESULTS: No results for input(s): WBC, HGB, HCT, PLT in the last 72 hours. BMET Recent Labs    09/14/21 1308  NA 137  K 4.5  CL 105  CO2 21*  GLUCOSE 117*  BUN 17  CREATININE 0.86  CALCIUM 8.8*   LFT Recent Labs    09/14/21 1308  PROT 6.6  ALBUMIN 3.5  AST 40  ALT 32  ALKPHOS 68  BILITOT 0.4     STUDIES: Images viewed  by me DG Cholangiogram Operative  Result Date: 09/14/2021 CLINICAL DATA:   Intraoperative cholangiogram during laparoscopic cholecystectomy. EXAM: INTRAOPERATIVE CHOLANGIOGRAM FLUOROSCOPY TIME:  21 seconds (11.7 mGy) COMPARISON:  Right upper quadrant abdominal ultrasound-07/29/2021 FINDINGS: Intraoperative cholangiographic images of the right upper abdominal quadrant during laparoscopic cholecystectomy are provided for review. There is attempted cannulation of the cystic duct with extravasation of contrast about the cannulation site. There is however opacification of the CBD with passage of contrast to the level of the duodenum. There is a persistent nonocclusive lentiform shaped filling defect within the distal aspect of the CBD worrisome for nonocclusive choledocholithiasis (representative image 33, series 2). Multiple opacities overlies expected location of the gallbladder lumen compatible with known cholelithiasis. IMPRESSION: Limited intraoperative cholangiogram demonstrates findings worrisome for nonocclusive choledocholithiasis. Further evaluation and management with ERCP could be performed as indicated. Electronically Signed   By: Sandi Mariscal M.D.   On: 09/14/2021 10:05   MR ABDOMEN MRCP W WO CONTAST  Result Date: 09/14/2021 CLINICAL DATA:  Evaluate for choledocholithiasis. EXAM: MRI ABDOMEN WITHOUT AND WITH CONTRAST (INCLUDING MRCP) TECHNIQUE: Multiplanar multisequence MR imaging of the abdomen was performed both before and after the administration of intravenous contrast. Heavily T2-weighted images of the biliary and pancreatic ducts were obtained, and three-dimensional MRCP images were rendered by post processing. CONTRAST:  11mL GADAVIST GADOBUTROL 1 MMOL/ML IV SOLN COMPARISON:  CT AP 06/02/2021 FINDINGS: Lower chest: Atelectasis is noted within the lung bases. Hepatobiliary: There is no focal liver abnormality. Diffuse hepatic steatosis. Postop change from cholecystectomy. Exam detail is diminished due to motion artifact limiting assessment for presence or absence of  choledocholithiasis. The common bile duct measures 7 mm in maximum diameter. There is a suggestion of a small filling defect within the distal CBD on the axial images only measuring 3 mm, image 28/5 and image 28/6. This cannot be confirmed on the motion limited MRCP and coronal T2 weighted images. Pancreas: No mass, inflammatory changes, or other parenchymal abnormality identified. Spleen: The spleen measures 13.9 by 11.0 x 4.9 cm (volume = 390 cm^3), image 39/18 Adrenals/Urinary Tract: Normal adrenal glands. No kidney mass or hydronephrosis. Stomach/Bowel: Visualized portions within the abdomen are unremarkable. Vascular/Lymphatic: No pathologically enlarged lymph nodes identified. No abdominal aortic aneurysm demonstrated. Other:  No ascites or focal fluid collections. Musculoskeletal: No suspicious bone lesions identified. IMPRESSION: 1. Exam detail is diminished due to motion artifact limiting assessment for presence or absence of choledocholithiasis. 2. The common bile duct measures 7 mm in maximum diameter. There is a suggestion of a small filling defect within the distal CBD on the axial images only measuring 3 mm. This cannot be confirmed on the motion limited MRCP and coronal T2 weighted images. Small distal common bile duct stone therefore cannot be excluded. 3. Diffuse hepatic steatosis. 4. Mild splenomegaly. Electronically Signed   By: Kerby Moors M.D.   On: 09/14/2021 16:39  Thanks   LOS: 0 days   @Kairi Tufo  , MD, [facility] @  09/15/2021, 9:26 AM

## 2021-09-16 LAB — CBC
HCT: 31.8 % — ABNORMAL LOW (ref 36.0–46.0)
Hemoglobin: 10.4 g/dL — ABNORMAL LOW (ref 12.0–15.0)
MCH: 30.2 pg (ref 26.0–34.0)
MCHC: 32.7 g/dL (ref 30.0–36.0)
MCV: 92.4 fL (ref 80.0–100.0)
Platelets: 159 10*3/uL (ref 150–400)
RBC: 3.44 MIL/uL — ABNORMAL LOW (ref 3.87–5.11)
RDW: 12 % (ref 11.5–15.5)
WBC: 7.3 10*3/uL (ref 4.0–10.5)
nRBC: 0 % (ref 0.0–0.2)

## 2021-09-16 LAB — COMPREHENSIVE METABOLIC PANEL
ALT: 23 U/L (ref 0–44)
AST: 22 U/L (ref 15–41)
Albumin: 3.2 g/dL — ABNORMAL LOW (ref 3.5–5.0)
Alkaline Phosphatase: 57 U/L (ref 38–126)
Anion gap: 9 (ref 5–15)
BUN: 15 mg/dL (ref 6–20)
CO2: 25 mmol/L (ref 22–32)
Calcium: 8.5 mg/dL — ABNORMAL LOW (ref 8.9–10.3)
Chloride: 100 mmol/L (ref 98–111)
Creatinine, Ser: 0.73 mg/dL (ref 0.44–1.00)
GFR, Estimated: >60 mL/min (ref >60)
Glucose, Bld: 109 mg/dL — ABNORMAL HIGH (ref 70–99)
Potassium: 4.4 mmol/L (ref 3.5–5.1)
Sodium: 134 mmol/L — ABNORMAL LOW (ref 135–145)
Total Bilirubin: 0.5 mg/dL (ref 0.3–1.2)
Total Protein: 6.2 g/dL — ABNORMAL LOW (ref 6.5–8.1)

## 2021-09-16 MED ORDER — OXYCODONE HCL 5 MG PO TABS: 5.0000 mg | ORAL_TABLET | Freq: Four times a day (QID) | ORAL | 0 refills | Status: AC | PRN

## 2021-09-16 MED ORDER — ACETAMINOPHEN 500 MG PO TABS: 1000.0000 mg | ORAL_TABLET | Freq: Three times a day (TID) | ORAL | 0 refills | Status: AC | PRN

## 2021-09-16 MED ORDER — DOCUSATE SODIUM 100 MG PO CAPS: 100.0000 mg | ORAL_CAPSULE | Freq: Two times a day (BID) | ORAL | 0 refills | Status: DC

## 2021-09-16 NOTE — Progress Notes (Signed)
Regina Morales 10:36 AM  Subjective: Patient with minimal discomfort and minimal nausea after eating but says she is improving her pain is different than what brought her into the hospital and she has no new complaints and no problems from her ERCP  Objective: Vital signs stable afebrile patient not examined today looks well sitting up in the chair no acute distress labs okay  Assessment: Status post lap chole and ERCP for CBD stones  Plan: Please call me if I can be of any further assistance with this hospital stay otherwise discharge per surgical team and I am happy to see back in the office as needed  [facility]  office [phone] After 5PM or if no answer call [phone]

## 2021-09-16 NOTE — Progress Notes (Addendum)
Cardiology Office Note Date:  09/16/2021  Patient ID:  Regina Morales, DOB 03-22-1961, MRN [id number] PCP:  Loyola Mast, MD  Cardiologist:  Dr. Eden Emms Electrophysiologist: Dr. Elberta Fortis    Chief Complaint:  Planned f/u  History of Present Illness: Regina Morales is a 60 y.o. female with history of HTN, OSA w/CPAP, Afib, DVT/PE (w/pregnancy), GERD, fibromyalgia.  She comes in today to be seen for Dr. Elberta Fortis, last seen by her 03/15/21, arhythmia burden down, sadly still grieving loss of her daughter, working with her MD, the cymbalta helping her depression though causing a tremor. QTc was stable, discussed importance when discussing her medicines/management with her PMD/psychiatrist that they are aware of her Tikosyn.  She had elective lap-chole 09/14/21 discharged 09/17/21 to resume her Eliquis 3 days post-op (11/15).  TODAY She comes today accompanied by her roommate. Cardiac-wise she reports feeling OK No CP, no palpitations She is s/p lap-chole/ERCP and still sore, able to eat some yesterday better Roommate has been urging to her to mobilize The patient states she was called that Dr. Elberta Fortis wanted her to come in, was unclear as to why. She resumed Eliquis yesterday as instructed She reports compliance with medicines  She has post op surgery follow up in place  Note a tremor, in d/w pt/roommate, following with psychiatry and felt 2/2 medicines, the patient states that psychiatry mentioned a side effect of her medication though perhaps a reasonable comprimise for + benefits of her medicines. Urged ongoing discussions, roommate a bit frustrated says they only ever get video visits, they will continue discussions with her PMD and psychiatry provider  Afib hx Diagnosed 2014 Flecainide failed with recurrent arrhythmia Tikosyn started Nov 2019  Past Medical History:  Diagnosis Date   Anemia    hx years ago   Anxiety    years ago   Buzzing in ear    "right; even after  OR"   Chest pain    a. 2009: neg MV  ([facility])   COPD (chronic obstructive pulmonary disease) ([facility]) 2018   Depression    DVT (deep vein thrombosis) in pregnancy 1980's   LLE   Fibromyalgia    "dx'd many many years ago; I haven't had any problems w/it" (04/15/2017)   GERD (gastroesophageal reflux disease)    Hepatic steatosis    High cholesterol    History of blood transfusion    "low count after appendix surgery" unsure # of units transfused   Hypertension    Migraines    "maybe once/2 months" (04/15/2017)   Obesity    OSA on CPAP    Persistent atrial fibrillation ([facility])    a. Dx 07/2013;  b. CHA2DS2VASc=1 (female);  c. 07/2013 Echo:  EF 50-55%, no rwma, mod LVH.   Pulmonary embolism ([facility]) ~ 1984   "after I had my last child"   SVD (spontaneous vaginal delivery)    x 4    Past Surgical History:  Procedure Laterality Date   APPENDECTOMY  1990's   BREAST CYST ASPIRATION Left 2019   @ [facility]   CHOLECYSTECTOMY N/A 09/14/2021   Procedure: LAPAROSCOPIC CHOLECYSTECTOMY WITH INTRAOPERATIVE CHOLANGIOGRAM;  Surgeon: Fritzi Mandes, MD;  Location[facility];  Service: General;  Laterality: N/A;   COLONOSCOPY     DILATATION & CURETTAGE/HYSTEROSCOPY WITH MYOSURE N/A 04/14/2017   Procedure: DILATATION & CURETTAGE/HYSTEROSCOPY WITH MYOSURE;  Surgeon: Myna Hidalgo, DO;  Location: [facility];  Service: Gynecology;  Laterality: N/A;  Polypectomy   DILATION AND CURETTAGE OF UTERUS  1983   [facility]   INNER EAR SURGERY Right 2000's   LEFT HEART CATH AND CORONARY ANGIOGRAPHY N/A 04/17/2017   Procedure: Left Heart Cath and Coronary Angiography;  Surgeon: Corky Crafts, MD;  [facility];  Service: Cardiovascular;  Laterality: N/A;   PLANTAR FASCIA RELEASE Right 07/2015   TUBAL LIGATION  1985   ULNAR NERVE TRANSPOSITION Right     No current facility-administered medications for this visit.   Current Outpatient Medications  Medication Sig Dispense Refill   acetaminophen (TYLENOL) 500  MG tablet Take 2 tablets (1,000 mg total) by mouth 3 (three) times daily as needed for mild pain. 30 tablet 0   docusate sodium (COLACE) 100 MG capsule Take 1 capsule (100 mg total) by mouth 2 (two) times daily. 10 capsule 0   oxyCODONE (OXY IR/ROXICODONE) 5 MG immediate release tablet Take 1 tablet (5 mg total) by mouth every 6 (six) hours as needed for up to 3 days for severe pain. 10 tablet 0   Facility-Administered Medications Ordered in Other Visits  Medication Dose Route Frequency Provider Last Rate Last Admin   acetaminophen (TYLENOL) tablet 1,000 mg  1,000 mg Oral TID Vida Rigger, MD   1,000 mg at 09/16/21 0918   albuterol (PROVENTIL) (2.5 MG/3ML) 0.083% nebulizer solution 2.5 mg  2.5 mg Inhalation Q6H PRN Vida Rigger, MD       diphenhydrAMINE (BENADRYL) capsule 25 mg  25 mg Oral Q6H PRN Vida Rigger, MD       Or   diphenhydrAMINE (BENADRYL) injection 25 mg  25 mg Intravenous Q6H PRN Vida Rigger, MD       divalproex (DEPAKOTE ER) 24 hr tablet 750 mg  750 mg Oral QPM Vida Rigger, MD   750 mg at 09/15/21 1744   docusate sodium (COLACE) capsule 100 mg  100 mg Oral BID Vida Rigger, MD   100 mg at 09/16/21 1308   dofetilide (TIKOSYN) capsule 500 mcg  500 mcg Oral BID Vida Rigger, MD   500 mcg at 09/16/21 6578   DULoxetine (CYMBALTA) DR capsule 120 mg  120 mg Oral Daily Vida Rigger, MD   120 mg at 09/16/21 0916   indomethacin (INDOCIN) 50 MG suppository 100 mg  100 mg Rectal Once Vida Rigger, MD       ketorolac (TORADOL) 15 MG/ML injection 15 mg  15 mg Intravenous Q8H Magod, Vernia Buff, MD   15 mg at 09/16/21 0500   lurasidone (LATUDA) tablet 60 mg  60 mg Oral QHS Vida Rigger, MD   60 mg at 09/15/21 2140   metoprolol tartrate (LOPRESSOR) tablet 50 mg  50 mg Oral BID Vida Rigger, MD   50 mg at 09/16/21 0916   mirabegron ER (MYRBETRIQ) tablet 50 mg  50 mg Oral Daily Vida Rigger, MD   50 mg at 09/16/21 0916   ondansetron (ZOFRAN-ODT) disintegrating tablet 4 mg  4 mg Oral Q6H PRN Vida Rigger, MD       Or    ondansetron [facility]) injection 4 mg  4 mg Intravenous Q6H PRN Vida Rigger, MD       oxyCODONE (Oxy IR/ROXICODONE) immediate release tablet 5 mg  5 mg Oral Q4H PRN Vida Rigger, MD   5 mg at 09/16/21 4696    Allergies:   Codeine   Social History:  The patient  reports that she quit smoking about 30 years ago. Her smoking use included cigarettes. She has a 8.00 pack-year smoking history. She has never used smokeless tobacco.  She reports that she does not currently use alcohol. She reports that she does not use drugs.   Family History:  The patient's family history includes Alcohol abuse in her father; Atrial fibrillation in her brother; Cardiomyopathy in her daughter; Congestive Heart Failure in her mother; Kidney failure in her mother; Lung cancer in her father; Sudden Cardiac Death in her daughter; Wolff Parkinson White syndrome in her grandchild.  ROS:  Please see the history of present illness.    All other systems are reviewed and otherwise negative.   PHYSICAL EXAM:  VS:  There were no vitals taken for this visit. BMI: There is no height or weight on file to calculate BMI. Well nourished, well developed, in no acute distress HEENT: normocephalic, atraumatic Neck: no JVD, carotid bruits or masses Cardiac:  RRR; no significant murmurs, no rubs, or gallops Lungs:  CTA b/l, no wheezing, rhonchi or rales Abd: not examined today (post-op) MS: no deformity or atrophy Ext: no edema Skin: warm and dry, no rash Neuro:  No gross deficits appreciated, termor is known for her Psych: euthymic mood, full affect   EKG:  Done today and reviewed by myself shows  ST 102bpm, QTc 432ms   06/15/2020: TTE IMPRESSIONS   1. Left ventricular ejection fraction, by estimation, is 55 to 60%. The  left ventricle has normal function. The left ventricle has no regional  wall motion abnormalities. Left ventricular diastolic parameters were  normal.   2. Right ventricular systolic function is normal. The  right ventricular  size is normal. Tricuspid regurgitation signal is inadequate for assessing  PA pressure.   3. The mitral valve is normal in structure. Trivial mitral valve  regurgitation. No evidence of mitral stenosis.   4. The aortic valve is tricuspid. Aortic valve regurgitation is not  visualized. No aortic stenosis is present.   Comparison(s): 04/17/17 EF 55-60%.   Conclusion(s)/Recommendation(s): Normal biventricular function without  evidence of hemodynamically significant valvular heart disease.    04/17/2017: [facility] The left ventricular systolic function is normal. LV end diastolic pressure is moderately elevated. LVEDP 25 mm Hg. The left ventricular ejection fraction is 50-55% by visual estimate. There is no aortic valve stenosis. No angiographically apparent coronary artery disease.   Recent Labs: 09/16/2021: ALT 23; BUN 15; Creatinine, Ser 0.73; Hemoglobin 10.4; Platelets 159; Potassium 4.4; Sodium 134  No results found for requested labs within last 8760 hours.   Estimated Creatinine Clearance: 108.3 mL/min (by C-G formula based on SCr of 0.73 mg/dL).   Wt Readings from Last 3 Encounters:  09/15/21 282 lb (127.9 kg)  09/13/21 282 lb 6.4 oz (128.1 kg)  09/06/21 286 lb 14.4 oz (130.1 kg)     Other studies reviewed: Additional studies/records reviewed today include: summarized above  ASSESSMENT AND PLAN:  Paroxysmal Afib CHA2DS2Vasc is 4, on Eliquis Tikosyn Maintaining SR QTc is stable Will update her mag level today  STOP zofran, discussed at length today Spoke that any time she needs to start a new medicine prescribed or OTC and with any dose changes to please call and let us know prior to taking and will review if it is OK with her Dofetilide. She understands   HTN Looks OK , no changes  3. Tremor Pending f/u with PMD/psychiatry   Disposition: F/u with [place] in 53mo, sooner if needed  Current medicines are reviewed at length with the patient today.   The patient did not have any concerns regarding medicines.  Venetia Night, PA-C 09/16/2021 9:31 AM  [facility] [place]  [place] [phone] (office)  [phone] (fax)

## 2021-09-16 NOTE — Discharge Summary (Signed)
Physician Discharge Summary   Patient ID: Regina Morales [id number] y.o. [birth year]  Admit date: 09/14/2021  Discharge date and time: 09/17/2021  Admitting Physician: Fritzi Mandes, MD   Discharge Physician: Sophronia Simas, MD  Admission Diagnoses: Choledocholithiasis [K80.50]  Discharge Diagnoses: S/p cholecystectomy, ERCP  Admission Condition: good  Discharged Condition: good  Indication for Admission: Regina Morales is a 60 year old female with symptomatic cholelithiasis, previously presented to the ED with upper abdominal pain.  She was found to have gallstones, as well as a mild elevation in LFTs.  She was referred to me as an outpatient and scheduled for cholecystectomy with intraoperative cholangiogram.    Hospital Course: The patient was taken to the OR on 09/14/2021 for elective cholecystectomy, and IOC was suspicious for a filling defect in the distal common bile duct.  This was confirmed on postoperative MRCP.  Patient was admitted, and GI was consulted.  She underwent an ERCP on 09/15/2021 with sphincterotomy and extraction of a stone from the common bile duct.  Following the procedure she was placed on a clear liquid diet which she tolerated.  On the morning of 11/13, her LFTs remain normal.  She was advanced to a soft diet, which she tolerated with minimal nausea.  On the morning of 11/14, her pain was controlled on oral medications and she was tolerating PO intake.  She was examined and deemed appropriate for discharge home. She may resume her Eliquis 3 days following her ERCP (on 09/18/21).  Consults: GI  Significant Diagnostic Studies: MRCP:  IMPRESSION: 1. Exam detail is diminished due to motion artifact limiting assessment for presence or absence of choledocholithiasis. 2. The common bile duct measures 7 mm in maximum diameter. There is a suggestion of a small filling defect within the distal CBD on the axial images only measuring 3 mm. This cannot be confirmed on  the motion limited MRCP and coronal T2 weighted images. Small distal common bile duct stone therefore cannot be excluded. 3. Diffuse hepatic steatosis. 4. Mild splenomegaly.  Treatments: analgesia: acetaminophen and oxycodone, procedures: ERCP, and surgery: laparoscopic cholecystectomy with intraoperative cholangiogram  Discharge Exam: General: resting comfortably, NAD Neuro: alert and oriented, no focal deficits Resp: normal work of breathing on room air Abdomen: soft, nondistended, mildly tender at incisions. Mild ecchymosis at umbilical incision, incisions are otherwise clean and dry with no drainage. Extremities: warm and well-perfused   Disposition: Discharge disposition: 01-Home or Self Care      Patient Instructions:  Allergies as of 09/17/2021       Reactions   Codeine Nausea And Vomiting, Other (See Comments), Nausea Only        Medication List     STOP taking these medications    HYDROcodone-acetaminophen 5-325 MG tablet Commonly known as: NORCO/VICODIN       TAKE these medications    acetaminophen 500 MG tablet Commonly known as: TYLENOL Take 2 tablets (1,000 mg total) by mouth 3 (three) times daily as needed for mild pain.   albuterol 108 (90 Base) MCG/ACT inhaler Commonly known as: VENTOLIN HFA Inhale 2 puffs into the lungs every 6 (six) hours as needed for wheezing or shortness of breath.   divalproex 250 MG 24 hr tablet Commonly known as: DEPAKOTE ER Take 250 mg by mouth See admin instructions. Take with 500 mg for a total on 750 mg in the evening   divalproex 500 MG 24 hr tablet Commonly known as: DEPAKOTE ER Take 500 mg by mouth See admin instructions. Take with  250 mg for a total of 750 mg in the evening   docusate sodium 100 MG capsule Commonly known as: COLACE Take 1 capsule (100 mg total) by mouth 2 (two) times daily.   dofetilide 500 MCG capsule Commonly known as: TIKOSYN TAKE 1 CAPSULE(500 MCG) BY MOUTH TWICE DAILY    DULoxetine 60 MG capsule Commonly known as: CYMBALTA Take 120 mg by mouth daily.   Eliquis 5 MG Tabs tablet Generic drug: apixaban Take 1 tablet (5 mg total) by mouth 2 (two) times daily.   Latuda 60 MG Tabs Generic drug: Lurasidone HCl Take 60 mg by mouth at bedtime.   metoprolol tartrate 50 MG tablet Commonly known as: LOPRESSOR TAKE 1 TABLET TWICE A DAY   mirabegron ER 50 MG Tb24 tablet Commonly known as: MYRBETRIQ Take 50 mg by mouth daily.   ondansetron 4 MG disintegrating tablet Commonly known as: Zofran ODT Take 1 tablet (4 mg total) by mouth every 8 (eight) hours as needed for nausea or vomiting.   oxyCODONE 5 MG immediate release tablet Commonly known as: Oxy IR/ROXICODONE Take 1 tablet (5 mg total) by mouth every 6 (six) hours as needed for up to 3 days for severe pain.   rizatriptan 10 MG tablet Commonly known as: MAXALT Take 10 mg by mouth as needed for migraine. May repeat in 2 hours if needed   topiramate 50 MG tablet Commonly known as: TOPAMAX Take 50 mg by mouth 2 (two) times daily.       Activity: no heavy lifting for 4 weeks Diet: regular diet Wound Care: keep wound clean and dry  Follow-up with Dr. Zenia Resides on October 02, 2021 at 9:45am.  Signed: Dwan Bolt 09/17/2021 6:31 AM

## 2021-09-16 NOTE — Progress Notes (Signed)
Spoke with Trinna Post, [facility] pharmacist and she stated checking QTC was not necessary prior to administration as this is not her first does and it is a home medication.

## 2021-09-16 NOTE — Progress Notes (Signed)
1 Day Post-Op  Subjective: ERCP completed yesterday with sphincterotomy and removal of a stone and sludge unremarkable today, LFTs normal. Patient still having some pain at incisions. Tolerating clear liquids.   Objective: Vital signs in last 24 hours: Temp:  [97.6 F (36.4 C)-98.3 F (36.8 C)] 97.8 F (36.6 C) (11/13 0429) Pulse Rate:  [61-76] 61 (11/13 0429) Resp:  [13-18] 16 (11/12 2016) BP: (105-141)/(60-76) 141/75 (11/13 0429) SpO2:  [92 %-98 %] 96 % (11/13 0429) Weight:  [127.9 kg] 127.9 kg (11/12 1353) Last BM Date: 09/13/21  Intake/Output from previous day: 11/12 0701 - 11/13 0700 In: 1070 [P.O.:120; I.V.:900; IV Piggyback:50] Out: 1200 [Urine:1200] Intake/Output this shift: Total I/O In: 120 [P.O.:120] Out: 1200 [Urine:1200]  PE: General: resting comfortably, NAD Neuro: alert and oriented, no focal deficits Resp: normal work of breathing on room air Abdomen: soft, nondistended, nontender to palpation. Incisions clean and dry, mild ecchymosis at umbilical incision, no induration or drainage. Extremities: warm and well-perfused   Lab Results:  Recent Labs    09/16/21 0239  WBC 7.3  HGB 10.4*  HCT 31.8*  PLT 159   BMET Recent Labs    09/14/21 1308 09/16/21 0239  NA 137 134*  K 4.5 4.4  CL 105 100  CO2 21* 25  GLUCOSE 117* 109*  BUN 17 15  CREATININE 0.86 0.73  CALCIUM 8.8* 8.5*   PT/INR No results for input(s): LABPROT, INR in the last 72 hours. CMP     Component Value Date/Time   NA 134 (L) 09/16/2021 0239   NA 141 04/13/2020 0000   K 4.4 09/16/2021 0239   CL 100 09/16/2021 0239   CO2 25 09/16/2021 0239   GLUCOSE 109 (H) 09/16/2021 0239   BUN 15 09/16/2021 0239   BUN 9 04/13/2020 0000   CREATININE 0.73 09/16/2021 0239   CALCIUM 8.5 (L) 09/16/2021 0239   PROT 6.2 (L) 09/16/2021 0239   ALBUMIN 3.2 (L) 09/16/2021 0239   AST 22 09/16/2021 0239   ALT 23 09/16/2021 0239   ALKPHOS 57 09/16/2021 0239   BILITOT 0.5 09/16/2021 0239    GFRNONAA >60 09/16/2021 0239   GFRAA >60 05/02/2020 0638   Lipase     Component Value Date/Time   LIPASE 41 06/02/2021 1044       Studies/Results: DG Cholangiogram Operative  Result Date: 09/14/2021 CLINICAL DATA:  Intraoperative cholangiogram during laparoscopic cholecystectomy. EXAM: INTRAOPERATIVE CHOLANGIOGRAM FLUOROSCOPY TIME:  21 seconds (11.7 mGy) COMPARISON:  Right upper quadrant abdominal ultrasound-07/29/2021 FINDINGS: Intraoperative cholangiographic images of the right upper abdominal quadrant during laparoscopic cholecystectomy are provided for review. There is attempted cannulation of the cystic duct with extravasation of contrast about the cannulation site. There is however opacification of the CBD with passage of contrast to the level of the duodenum. There is a persistent nonocclusive lentiform shaped filling defect within the distal aspect of the CBD worrisome for nonocclusive choledocholithiasis (representative image 33, series 2). Multiple opacities overlies expected location of the gallbladder lumen compatible with known cholelithiasis. IMPRESSION: Limited intraoperative cholangiogram demonstrates findings worrisome for nonocclusive choledocholithiasis. Further evaluation and management with ERCP could be performed as indicated. Electronically Signed   By: Simonne Come M.D.   On: 09/14/2021 10:05   DG ERCP BILIARY & PANCREATIC DUCTS  Result Date: 09/15/2021 CLINICAL DATA:  Choledocholithiasis. EXAM: ERCP COMPARISON:  MRCP, 09/14/2021. CT pelvis, 06/02/2021. Intraoperative fluoroscopy, 09/14/2021. FLUOROSCOPY TIME:  Fluoroscopy Time:  52.9 seconds Radiation Exposure Index (if provided by the fluoroscopic device): 26.8 mGy Number  of Acquired Spot Images: 3 static images, submitted for review. FINDINGS: Multiple, limited oblique planar images of the RIGHT upper quadrant obtained by C-arm Images demonstrating flexible endoscopy, common bile duct cannulation, cholangiogram and  balloon sweep. No biliary ductal dilation or discrete filling defect demonstrated. IMPRESSION: Fluoroscopic imaging for ERCP, cholangiogram and balloon sweep. For complete description of intra procedural findings, please see procedural service dictation. Electronically Signed   By: Roanna Banning M.D.   On: 09/15/2021 15:08   MR ABDOMEN MRCP W WO CONTAST  Result Date: 09/14/2021 CLINICAL DATA:  Evaluate for choledocholithiasis. EXAM: MRI ABDOMEN WITHOUT AND WITH CONTRAST (INCLUDING MRCP) TECHNIQUE: Multiplanar multisequence MR imaging of the abdomen was performed both before and after the administration of intravenous contrast. Heavily T2-weighted images of the biliary and pancreatic ducts were obtained, and three-dimensional MRCP images were rendered by post processing. CONTRAST:  62mL GADAVIST GADOBUTROL 1 MMOL/ML IV SOLN COMPARISON:  CT AP 06/02/2021 FINDINGS: Lower chest: Atelectasis is noted within the lung bases. Hepatobiliary: There is no focal liver abnormality. Diffuse hepatic steatosis. Postop change from cholecystectomy. Exam detail is diminished due to motion artifact limiting assessment for presence or absence of choledocholithiasis. The common bile duct measures 7 mm in maximum diameter. There is a suggestion of a small filling defect within the distal CBD on the axial images only measuring 3 mm, image 28/5 and image 28/6. This cannot be confirmed on the motion limited MRCP and coronal T2 weighted images. Pancreas: No mass, inflammatory changes, or other parenchymal abnormality identified. Spleen: The spleen measures 13.9 by 11.0 x 4.9 cm (volume = 390 cm^3), image 39/18 Adrenals/Urinary Tract: Normal adrenal glands. No kidney mass or hydronephrosis. Stomach/Bowel: Visualized portions within the abdomen are unremarkable. Vascular/Lymphatic: No pathologically enlarged lymph nodes identified. No abdominal aortic aneurysm demonstrated. Other:  No ascites or focal fluid collections. Musculoskeletal: No  suspicious bone lesions identified. IMPRESSION: 1. Exam detail is diminished due to motion artifact limiting assessment for presence or absence of choledocholithiasis. 2. The common bile duct measures 7 mm in maximum diameter. There is a suggestion of a small filling defect within the distal CBD on the axial images only measuring 3 mm. This cannot be confirmed on the motion limited MRCP and coronal T2 weighted images. Small distal common bile duct stone therefore cannot be excluded. 3. Diffuse hepatic steatosis. 4. Mild splenomegaly. Electronically Signed   By: Signa Kell M.D.   On: 09/14/2021 16:39       Assessment/Plan 60 yo female POD2 s/p laparoscopic cholecystectomy with possible CBD stone on IOC, confirmed with MRCP. 1 day s/p ERCP with stone extraction. - Soft diet this morning - Pain control: prn oxycodone, scheduled tylenol and toradol - Home medications ordered - VTE: SCDs - Dispo: med-surg floor, discharge home later today if tolerating diet   LOS: 1 day    Sophronia Simas, MD [facility] Surgery General, Hepatobiliary and Pancreatic Surgery 09/16/21 6:43 AM

## 2021-09-17 ENCOUNTER — Encounter (HOSPITAL_COMMUNITY): Payer: Self-pay | Admitting: Gastroenterology

## 2021-09-17 LAB — SURGICAL PATHOLOGY

## 2021-09-17 NOTE — Progress Notes (Signed)
Mobility Specialist Progress Note   09/17/21 1052  Mobility  Activity Ambulated in hall  Level of Assistance Contact guard assist, steadying assist  Assistive Device Front wheel walker  Distance Ambulated (ft) 110 ft  Mobility Ambulated with assistance in hallway  Mobility Response Tolerated well  Mobility performed by Mobility specialist  $Mobility charge 1 Mobility   Received pt in bed having no complaints and agreeable to mobility. While sitting EOB pt did c/o of slight dizziness, spell quickly subsided. No breaks need during ambulation but pt did c/o slight pain in there abdomin. Returned back to bed w/ call bell placed by side and all needs met.   Holland Falling Mobility Specialist Phone Number [phone]

## 2021-09-17 NOTE — Progress Notes (Signed)
Patient discharged to home with instructions given and verbalized understanding.

## 2021-09-19 ENCOUNTER — Encounter: Payer: Self-pay | Admitting: Physician Assistant

## 2021-09-19 ENCOUNTER — Ambulatory Visit (INDEPENDENT_AMBULATORY_CARE_PROVIDER_SITE_OTHER): Payer: BC Managed Care – PPO | Admitting: Physician Assistant

## 2021-09-19 ENCOUNTER — Other Ambulatory Visit: Payer: Self-pay

## 2021-09-19 VITALS — BP 140/80 | HR 102 | Ht 68.0 in | Wt 284.4 lb

## 2021-09-19 DIAGNOSIS — Z5181 Encounter for therapeutic drug level monitoring: Secondary | ICD-10-CM

## 2021-09-19 DIAGNOSIS — Z79899 Other long term (current) drug therapy: Secondary | ICD-10-CM

## 2021-09-19 DIAGNOSIS — I480 Paroxysmal atrial fibrillation: Secondary | ICD-10-CM

## 2021-09-19 DIAGNOSIS — I10 Essential (primary) hypertension: Secondary | ICD-10-CM

## 2021-09-19 LAB — MAGNESIUM: Magnesium: 2.1 mg/dL (ref 1.6–2.3)

## 2021-09-19 NOTE — Patient Instructions (Signed)
Medication Instructions:   STOP TAKING ZOFRAN   *If you need a refill on your cardiac medications before your next appointment, please call your pharmacy*   Lab Work: MAG TODAY   If you have labs (blood work) drawn today and your tests are completely normal, you will receive your results only by: MyChart Message (if you have MyChart) OR A paper copy in the mail If you have any lab test that is abnormal or we need to change your treatment, we will call you to review the results.   Testing/Procedures: NONE ORDERED  TODAY    Follow-Up: At [facility], you and your health needs are our priority.  As part of our continuing mission to provide you with exceptional heart care, we have created designated Provider Care Teams.  These Care Teams include your primary Cardiologist (physician) and Advanced Practice Providers (APPs -  Physician Assistants and Nurse Practitioners) who all work together to provide you with the care you need, when you need it.  We recommend signing up for the patient portal called "MyChart".  Sign up information is provided on this After Visit Summary.  MyChart is used to connect with patients for Virtual Visits (Telemedicine).  Patients are able to view lab/test results, encounter notes, upcoming appointments, etc.  Non-urgent messages can be sent to your provider as well.   To learn more about what you can do with MyChart, go to [email or web address].    Your next appointment:   6 month(s)  The format for your next appointment:   In Person  Provider:   You may see Dr. Elberta Fortis or one of the following Advanced Practice Providers on your designated Care Team:   Francis Dowse, [place] Casimiro Needle "Mardelle Matte" Lanna Poche, [place]    Other Instructions

## 2021-09-24 ENCOUNTER — Telehealth: Payer: Self-pay

## 2021-09-24 NOTE — Telephone Encounter (Signed)
**Note De-Identified  Obfuscation** Annitta [place] Key: [id number] - PA Case ID: [id number] - Rx #: [id number] Outcome: This request has been approved using information available on the patient's profile. Prior Auth;Coverage Start Date:08/25/2021;Coverage End Date:09/24/2022; Drug Eliquis 5MG  tablets Form: [employer] Electronic PA Form (2017 NCPDP) Original Claim Info 60  I have notified [facility] pharmacy of this approval.

## 2021-09-24 NOTE — Telephone Encounter (Signed)
**Note De-Identified  Obfuscation** Eliquis PA started through covermymeds. Key: [id number]

## 2021-09-25 ENCOUNTER — Other Ambulatory Visit: Payer: Self-pay

## 2021-09-25 ENCOUNTER — Ambulatory Visit (HOSPITAL_BASED_OUTPATIENT_CLINIC_OR_DEPARTMENT_OTHER)
Admission: RE | Admit: 2021-09-25 | Discharge: 2021-09-25 | Disposition: A | Discharge status: Home / Self Care | Payer: BC Managed Care – PPO | Source: Ambulatory Visit | Attending: Surgery | Admitting: Surgery

## 2021-09-25 ENCOUNTER — Other Ambulatory Visit (HOSPITAL_COMMUNITY): Payer: Self-pay | Admitting: Surgery

## 2021-09-25 ENCOUNTER — Encounter (HOSPITAL_BASED_OUTPATIENT_CLINIC_OR_DEPARTMENT_OTHER): Payer: Self-pay

## 2021-09-25 ENCOUNTER — Other Ambulatory Visit (HOSPITAL_BASED_OUTPATIENT_CLINIC_OR_DEPARTMENT_OTHER): Payer: Self-pay | Admitting: Surgery

## 2021-09-25 ENCOUNTER — Other Ambulatory Visit: Payer: Self-pay | Admitting: Surgery

## 2021-09-25 DIAGNOSIS — R1013 Epigastric pain: Secondary | ICD-10-CM | POA: Insufficient documentation

## 2021-09-25 MED ORDER — IOHEXOL 300 MG/ML  SOLN
100.0000 mL | Freq: Once | INTRAMUSCULAR | Status: AC | PRN
  Administered 2021-09-25: 100 mL via INTRAVENOUS

## 2021-11-16 ENCOUNTER — Telehealth: Payer: Self-pay | Admitting: Family Medicine

## 2021-11-16 ENCOUNTER — Ambulatory Visit: Payer: BC Managed Care – PPO | Admitting: Family Medicine

## 2021-11-16 NOTE — Telephone Encounter (Signed)
Pt didn't show for 8 app.

## 2021-12-07 ENCOUNTER — Telehealth (INDEPENDENT_AMBULATORY_CARE_PROVIDER_SITE_OTHER): Payer: BC Managed Care – PPO | Admitting: Family Medicine

## 2021-12-07 ENCOUNTER — Other Ambulatory Visit: Payer: Self-pay

## 2021-12-07 VITALS — Ht 68.0 in | Wt 282.0 lb

## 2021-12-07 DIAGNOSIS — G43709 Chronic migraine without aura, not intractable, without status migrainosus: Secondary | ICD-10-CM | POA: Diagnosis not present

## 2021-12-07 DIAGNOSIS — I480 Paroxysmal atrial fibrillation: Secondary | ICD-10-CM

## 2021-12-07 DIAGNOSIS — M5136 Other intervertebral disc degeneration, lumbar region: Secondary | ICD-10-CM

## 2021-12-07 DIAGNOSIS — R32 Unspecified urinary incontinence: Secondary | ICD-10-CM

## 2021-12-07 DIAGNOSIS — K9189 Other postprocedural complications and disorders of digestive system: Secondary | ICD-10-CM

## 2021-12-07 DIAGNOSIS — F332 Major depressive disorder, recurrent severe without psychotic features: Secondary | ICD-10-CM

## 2021-12-07 DIAGNOSIS — H903 Sensorineural hearing loss, bilateral: Secondary | ICD-10-CM

## 2021-12-07 DIAGNOSIS — R197 Diarrhea, unspecified: Secondary | ICD-10-CM | POA: Insufficient documentation

## 2021-12-07 NOTE — Progress Notes (Signed)
[facility] [address] [place] [address] Dept: [phone] Dept Fax: [phone]  Virtual Video Visit  I connected with Regina Morales on 12/07/21 at 10:30 AM EST by a video enabled telemedicine application and verified that I am speaking with the correct person using two identifiers. She was asked to do her appointment virtually, as her roommate currently has COVID.  Location patient: Car, in the parking lot of the clinic. Location provider: Clinic Persons participating in the virtual visit: Patient, Provider  I discussed the limitations of evaluation and management by telemedicine and the availability of in person appointments. The patient expressed understanding and agreed to proceed.  Chief Complaint  Patient presents with   Follow-up    2 month f/u. C/o still having diarrhea, been taking Metamucil (seeing surgeon for this).  Would like a referral to hearing clinic. (Hearing getting worse and one of her hearing aids broke)   SUBJECTIVE:  HPI: Regina Morales is a 61 y.o. female who presents for reassessment of her chronic medical conditions.  Regina Morales had a a laparoscopic cholecystectomy and ERCP in Nov. Since then, she has had issues with persistent diarrhea. She has been working with her Psychologist, sport and exercise and is showing some improvement. She is on Metamucil and Questran for management.  Regina Morales has a history of severe depression. She is currently managed by Dr. Eustaquio Boyden (psychiatry) and Nicole Kindred (therapist). She is managed on duloxetine and divalproex for her depression. She does have a chronic head tremor that apparently is a medication side effect. She feels her mood has improved, esp. since she has been able to return to work.   Regina Morales has a history of atrial fibrillation. She is currently on Eliquis for anticoagulation and Tikosyn(dofetilide) and metoprolol to manage her rate and rhythm.   Regina Morales has  a history of migraine headaches. She is on topiramate and metoprolol.   Regina Morales has a history of lumbar disc disease and mild spinal stenosis. She had injections in her back in December and notes that her pain is much improved..   Regina Morales has a history of urinary incontinence. She is managed by Dr. Gloriann Loan. She is using mirabegron and is undergoing PT with pelvic exercises. She feels this is doing well.  Regina Morales has a history of hearing loss. she notes that she is having issues with background noise while at work. She states one of her hearing aides is broken. She requests a referral to audiology.  Patient Active Problem List   Diagnosis Date Noted   Postcholecystectomy diarrhea 12/07/2021   Sensorineural hearing loss (SNHL) of both ears 12/07/2021   Epigastric abdominal pain 08/20/2021   Diverticulitis 06/08/2021   Lumbar degenerative disc disease 05/28/2021   Mild spinal stenosis at L4-L5 level 05/28/2021   Severe recurrent major depression without psychotic features ([place]) 04/30/2020   OSA (obstructive sleep apnea) 07/27/2019   Stage 3 severe COPD by GOLD classification ([place]) 06/24/2019   Fibromyalgia 05/03/2019   Venous insufficiency of both lower extremities 04/29/2019   Visit for monitoring Tikosyn therapy 09/15/2018   Endometrial polyp    Normocytic anemia 04/24/2014   Migraine 04/22/2014   PAF (paroxysmal atrial fibrillation) (HCC)    GERD (gastroesophageal reflux disease)    BMI 40.0-44.9, adult [facility])    Past Surgical History:  Procedure Laterality Date   APPENDECTOMY  1990's   BREAST CYST ASPIRATION Left 2019   @ [facility]   CHOLECYSTECTOMY N/A 09/14/2021  Procedure: LAPAROSCOPIC CHOLECYSTECTOMY WITH INTRAOPERATIVE CHOLANGIOGRAM;  Surgeon: Dwan Bolt, MD;  Location: [facility];  Service: General;  Laterality: N/A;   COLONOSCOPY     DILATATION & CURETTAGE/HYSTEROSCOPY WITH MYOSURE N/A 04/14/2017   Procedure: [place];  Surgeon:  Janyth Pupa, DO;  Location: [facility];  Service: Gynecology;  Laterality: N/A;  Polypectomy   DILATION AND CURETTAGE OF UTERUS  1983   [facility]   ERCP N/A 09/15/2021   Procedure: ENDOSCOPIC RETROGRADE CHOLANGIOPANCREATOGRAPHY (ERCP);  Surgeon: Clarene Essex, MD;  Location: [facility];  Service: Endoscopy;  Laterality: N/A;   INNER EAR SURGERY Right 2000's   LEFT HEART CATH AND CORONARY ANGIOGRAPHY N/A 04/17/2017   Procedure: Left Heart Cath and Coronary Angiography;  Surgeon: Jettie Booze, MD;  Location: [facility];  Service: Cardiovascular;  Laterality: N/A;   PLANTAR FASCIA RELEASE Right 07/2015   REMOVAL OF STONES  09/15/2021   Procedure: REMOVAL OF STONES;  Surgeon: Clarene Essex, MD;  Location: [facility];  Service: Endoscopy;;   SPHINCTEROTOMY  09/15/2021   Procedure: [facility];  Surgeon: Clarene Essex, MD;  Location: [facility];  Service: Endoscopy;;   TUBAL LIGATION  1985   ULNAR NERVE TRANSPOSITION Right    Family History  Problem Relation Age of Onset   Lung cancer Father    Alcohol abuse Father    Kidney failure Mother        s/p renal Tx   Congestive Heart Failure Mother    Atrial fibrillation Brother    Sudden Cardiac Death Daughter        cardiac arrest   Cardiomyopathy Daughter    Yves Dill Parkinson White syndrome Grandchild         granddaughter   Social History   Tobacco Use   Smoking status: Former    Packs/day: 1.00    Years: 8.00    Pack years: 8.00    Types: Cigarettes    Quit date: 11/04/1990    Years since quitting: 31.1   Smokeless tobacco: Never  Vaping Use   Vaping Use: Never used  Substance Use Topics   Alcohol use: Not Currently   Drug use: No    Current Outpatient Medications:    acetaminophen (TYLENOL) 500 MG tablet, Take 2 tablets (1,000 mg total) by mouth 3 (three) times daily as needed for mild pain., Disp: 30 tablet, Rfl: 0   albuterol (VENTOLIN HFA) 108 (90 Base) MCG/ACT inhaler, Inhale 2 puffs into the lungs every 6  (six) hours as needed for wheezing or shortness of breath., Disp: , Rfl:    apixaban (ELIQUIS) 5 MG TABS tablet, Take 1 tablet (5 mg total) by mouth 2 (two) times daily., Disp: 180 tablet, Rfl: 2   cholestyramine (QUESTRAN) 4 g packet, Take by mouth., Disp: , Rfl:    divalproex (DEPAKOTE ER) 250 MG 24 hr tablet, Take 250 mg by mouth See admin instructions. Take with 500 mg for a total on 750 mg in the evening, Disp: , Rfl:    divalproex (DEPAKOTE ER) 500 MG 24 hr tablet, Take 500 mg by mouth See admin instructions. Take with 250 mg for a total of 750 mg in the evening, Disp: , Rfl:    dofetilide (TIKOSYN) 500 MCG capsule, TAKE 1 CAPSULE(500 MCG) BY MOUTH TWICE DAILY, Disp: 180 capsule, Rfl: 2   DULoxetine (CYMBALTA) 60 MG capsule, Take 120 mg by mouth daily., Disp: , Rfl:    LATUDA 60 MG TABS, Take 60 mg by mouth at  bedtime., Disp: , Rfl:    metoprolol tartrate (LOPRESSOR) 50 MG tablet, TAKE 1 TABLET TWICE A DAY, Disp: 180 tablet, Rfl: 3   mirabegron ER (MYRBETRIQ) 50 MG TB24 tablet, Take 50 mg by mouth daily., Disp: , Rfl:    rizatriptan (MAXALT) 10 MG tablet, Take 10 mg by mouth as needed for migraine. May repeat in 2 hours if needed, Disp: , Rfl:    topiramate (TOPAMAX) 50 MG tablet, Take 50 mg by mouth 2 (two) times daily., Disp: , Rfl:   Allergies  Allergen Reactions   Codeine Nausea And Vomiting, Other (See Comments) and Nausea Only   ROS: See pertinent positives and negatives per HPI.  OBSERVATIONS/OBJECTIVE:  VITALS per patient if applicable: Today's Vitals   12/07/21 1037  Weight: 282 lb (127.9 kg)  Height: 5\' 8"  (1.727 m)   Body mass index is 42.88 kg/m.   GENERAL: Alert and oriented. Appears well and in no acute distress.  PSYCH/NEURO: Pleasant and cooperative. No obvious depression or anxiety. Speech and thought processing grossly intact.  ASSESSMENT AND PLAN:  1. Postcholecystectomy diarrhea Improved with use of fiber and cholestyramine. She will continue to follow  with her surgeon about this.  2. PAF (paroxysmal atrial fibrillation) (HCC) Stable. Cotninue metoprolol and Eliquis.  3. Chronic migraine without aura without status migrainosus, not intractable Migraines only occurring about once a month. Continue metoprolol and topiramate for prevention and Maxalt for acute headaches.   4. Lumbar degenerative disc disease Improved since ESI.  5. Severe recurrent major depression without psychotic features (HCC) Stable on divalproex and duloxetine. She will continue to follow with psychiatry and her therapist. Definitely signs of improved mood today.  6. Sensorineural hearing loss (SNHL) of both ears I will refer her to Audiology to address hearing and hearing aide adjustments.  - Ambulatory referral to Audiology  7. Urinary incontinence, unspecified type Stable on Myrbetriq.   I discussed the assessment and treatment plan with the patient. The patient was provided an opportunity to ask questions and all were answered. The patient agreed with the plan and demonstrated an understanding of the instructions.   The patient was advised to call back or seek an in-person evaluation if the symptoms worsen or if the condition fails to improve as anticipated.   Haydee Salter, MD

## 2021-12-20 ENCOUNTER — Ambulatory Visit: Payer: BC Managed Care – PPO | Attending: Family Medicine | Admitting: Audiology

## 2021-12-20 ENCOUNTER — Other Ambulatory Visit: Payer: Self-pay

## 2021-12-20 DIAGNOSIS — H903 Sensorineural hearing loss, bilateral: Secondary | ICD-10-CM | POA: Diagnosis present

## 2021-12-20 NOTE — Procedures (Signed)
°  [facility] [address] [place], [place]  [address] [phone]  AUDIOLOGICAL  EVALUATION  NAME: Regina Morales     DOB:   01-16-1961      MRN: [id number]                                                                                     DATE: 12/20/2021     REFERENT: Loyola Mast, MD STATUS: Outpatient DIAGNOSIS: Mild to moderately severe sensorineural hearing loss   History: Shequila was seen for an audiological evaluation due to decreased hearing bilaterally. Crystalann is a previous hearing aid user and has a known sensorineural hearing loss. She reported at today's appointment she has noticed a decrease in hearing in both ears. She stated she feels like she hears better out of the right ear than the left. She is currently not wearing her hearing aids due to getting limited benefit from them. She denied any ear pain, pressure, fullness, and dizziness at today's appointment. Faiga stated that she thought she had hearing aid benefit through her insurance. She was instructed to contact her insurance to receive a list of providers that could work with her following today's evaluation.    Evaluation:  Otoscopy showed a clear view of the tympanic membranes, bilaterally Tympanometry results were consistent with normal middle ear mobility bilaterally Audiometric testing was completed using Conventional Audiometry techniques with insert earphones and high frequency headphones. Test results are consistent with mild sloping to moderately severe sensorineural hearing loss. Speech Recognition Thresholds were obtained at 55 dB HL in the right ear and at 55  dB HL in the left ear. Word Recognition Testing was completed at 90 dB HL and 100 dB HL and Jaylyn scored 88% in the right ear and 92%  in the left ear. Due to an asymmetry between the ears it is recommended that [facility] see an Ear, Nose, and Throat physician   Results:  The test results were reviewed  with Britta Mccreedy and that she does have a mild sloping to  moderately severe sensorineural hearing loss bilaterally. Due to the asymmetry between her hear it is recommended she follow up with ENT. Murial was given a copy of her audiogram to take with her.    Recommendations: 1.   Follow up with ENT to address asymmetry of the left ear.  2. A repeat hearing test may need to be completed before being fit with new hearing aids     If you have any questions please feel free to contact me at [phone].  [address] [place], [address]  Audiology Intern  Marton Redwood Audiologist, Au.D., CCC-A 12/20/2021  3:30 PM  Cc: Loyola Mast, MD

## 2022-02-06 ENCOUNTER — Other Ambulatory Visit: Payer: Self-pay | Admitting: *Deleted

## 2022-02-06 DIAGNOSIS — I480 Paroxysmal atrial fibrillation: Secondary | ICD-10-CM

## 2022-02-06 MED ORDER — APIXABAN 5 MG PO TABS: 5.0000 mg | ORAL_TABLET | Freq: Two times a day (BID) | ORAL | 1 refills | Status: DC

## 2022-02-06 NOTE — Telephone Encounter (Signed)
Prescription refill request for Eliquis received. ?Indication: Afib  ?Last office visit: 09/19/21 Keitha [place])  ?Scr: 0.73 (09/16/21)  ?Age: 61 ?Weight: 127.9kg ? ?Appropriate dose and refill sent to requested pharmacy.  ?

## 2022-02-18 ENCOUNTER — Encounter: Payer: Self-pay | Admitting: Family Medicine

## 2022-02-18 ENCOUNTER — Telehealth (INDEPENDENT_AMBULATORY_CARE_PROVIDER_SITE_OTHER): Payer: BC Managed Care – PPO | Admitting: Family Medicine

## 2022-02-18 VITALS — Temp 98.2°F | Wt 285.0 lb

## 2022-02-18 DIAGNOSIS — R197 Diarrhea, unspecified: Secondary | ICD-10-CM

## 2022-02-18 DIAGNOSIS — R112 Nausea with vomiting, unspecified: Secondary | ICD-10-CM

## 2022-02-18 DIAGNOSIS — A084 Viral intestinal infection, unspecified: Secondary | ICD-10-CM

## 2022-02-18 MED ORDER — ONDANSETRON HCL 4 MG PO TABS: 4.0000 mg | ORAL_TABLET | Freq: Three times a day (TID) | ORAL | 0 refills | Status: AC | PRN

## 2022-02-18 NOTE — Progress Notes (Signed)
Virtual Visit via Telephone Note ? ?I connected with Regina Morales on 02/18/22 at 10:00 AM EDT by telephone and verified that I am speaking with the correct person using two identifiers. ? ?Location: ?Patient: At home ?Provider: Office ?  ?I discussed the limitations, risks, security and privacy concerns of performing an evaluation and management service by telephone and the availability of in person appointments. I also discussed with the patient that there may be a patient responsible charge related to this service. The patient expressed understanding and agreed to proceed. ? ? ?History of Present Illness: ?Vomiting 2 2 days ?Diarrhea:  3 days  ?Some abdominal: Mild, generalized, slightly improved with Tylenol ?Sick contacts: none ?Associated symptoms: no fevers, vomit NBNB,  ?PO: Able to tolerate some liquids, not tolerating solids ?  ?  ?Observations/Objective: ?No evidence of overt distress or shortness of breath over the phone ? ?Assessment and Plan: ?Vomiting diarrhea ?Likely viral gastroenteritis ?Trial Zofran 4 mg every 8 hours as needed ?OTC trial loperamide as needed ?Return precautions discussed ? ?Follow Up Instructions: ? ?  ?I discussed the assessment and treatment plan with the patient. The patient was provided an opportunity to ask questions and all were answered. The patient agreed with the plan and demonstrated an understanding of the instructions. ?  ?The patient was advised to call back or seek an in-person evaluation if the symptoms worsen or if the condition fails to improve as anticipated. ? ?I provided 16 minutes of non-face-to-face time during this encounter. ? ? ?Garnette Gunner, MD ? ?

## 2022-02-18 NOTE — Progress Notes (Signed)
? ?  Regina Morales is a 61 y.o. female who presents today for an office visit. ? ?Assessment/Plan:  ?New/Acute Problems: ?Vomiting diarrhea ?Likely viral gastroenteritis ?Trial Zofran 4 mg every 8 hours as needed ?OTC trial loperamide as needed ?Return precautions discussed ? ?Chronic Problems Addressed Today: ?No problem-specific Assessment & Plan notes found for this encounter. ? ? ?  ?Subjective:  ?HPI: ?  ?Vomiting 2 2 days ?Diarrhea:  3 days  ?Some abdominal: Mild, generalized, slightly improved with Tylenol ?Sick contacts: none ?Associated symptoms: no fevers, vomit NBNB,  ?PO: Able to tolerate some liquids, not tolerating solids ? ? ?   ?  ?Objective:  ?Physical Exam: ?Temp 98.2 ?F (36.8 ?C) (Oral)   Wt 285 lb (129.3 kg)   BMI 43.33 kg/m?   ? ? ?   ? ?Katina Degree. Jimmey Ralph, MD ?02/18/2022 9:57 AM  ?

## 2022-02-25 ENCOUNTER — Encounter: Payer: Self-pay | Admitting: Family Medicine

## 2022-02-25 ENCOUNTER — Ambulatory Visit: Payer: BC Managed Care – PPO | Admitting: Family Medicine

## 2022-02-25 VITALS — BP 127/86 | HR 88 | Temp 96.8°F | Ht 68.0 in | Wt 306.2 lb

## 2022-02-25 DIAGNOSIS — K219 Gastro-esophageal reflux disease without esophagitis: Secondary | ICD-10-CM | POA: Diagnosis not present

## 2022-02-25 DIAGNOSIS — F332 Major depressive disorder, recurrent severe without psychotic features: Secondary | ICD-10-CM

## 2022-02-25 DIAGNOSIS — R197 Diarrhea, unspecified: Secondary | ICD-10-CM | POA: Diagnosis not present

## 2022-02-25 DIAGNOSIS — R1111 Vomiting without nausea: Secondary | ICD-10-CM

## 2022-02-25 DIAGNOSIS — R109 Unspecified abdominal pain: Secondary | ICD-10-CM

## 2022-02-25 MED ORDER — LOPERAMIDE HCL 2 MG PO CAPS: 4.0000 mg | ORAL_CAPSULE | ORAL | 0 refills | Status: DC | PRN

## 2022-02-25 MED ORDER — OMEPRAZOLE 40 MG PO CPDR: 40.0000 mg | DELAYED_RELEASE_CAPSULE | Freq: Every day | ORAL | 3 refills | Status: DC

## 2022-02-25 MED ORDER — ONDANSETRON HCL 4 MG PO TABS: 4.0000 mg | ORAL_TABLET | Freq: Three times a day (TID) | ORAL | 0 refills | Status: DC | PRN

## 2022-02-25 NOTE — Patient Instructions (Signed)
For vomiting take ondansetron. ?For diarrhea continue statin cholestyramine and loperamide. ?We are checking basic labs and an abdominal ultrasound. ?We are referring to gastroenterology for ongoing problems. ?Please follow-up with your psychiatrist for the ongoing depression. ?

## 2022-02-25 NOTE — Progress Notes (Signed)
? ?Acute Office Visit ? ?Subjective:  ? ?  ?Patient ID: Regina Morales, female    DOB: [birth year], 61 y.o.   MRN: [id number] ? ?Chief Complaint  ?Patient presents with  ? Acute Visit  ?  Pt c/o diarrhea throwing up started back Saturday and Sunday. States she has a burning pain in the abdomen and cramps in lower abdomen   ? ? ?HPI ?Patient is in today for patient was get improved, but had recurrence of vomiting and diarrhea over the past 2 days,  NBNB.  She is able to tolerate some p.o. fluids and liquids.  Patient has had ongoing chronic diarrhea since the removal of her gallbladder back approximately 6 months ago.  This is improved on cholestyramine.  Vomiting is improved on Zofran.  Recent episode of worsening diarrhea is improved with the addition of loperamide as needed.  Although she still has some fecal incontinence.  She also has some lower abdominal pain bilaterally.  Patient has also had her appendix removed.  Denies any fevers at the moment or urinary symptoms.  She also reports having some epigastric burning since the vomiting.  Denies any urinary burning or back pain. ? ? ? ?ROS ?As per HPI ? ?   ?Objective:  ?  ?BP 127/86 (BP Location: Left Arm, Patient Position: Sitting, Cuff Size: Large)   Pulse 88   Temp (!) 96.8 ?F (36 ?C) (Temporal)   Ht '5\' 8"'  (1.727 m)   Wt (!) 306 lb 3.2 oz (138.9 kg)   SpO2 97%   BMI 46.56 kg/m?  ? ? ?Gen: NAD, resting comfortably ?CV: RRR with no murmurs appreciated ?Pulm: NWOB, CTAB with no crackles, wheezes, or rhonchi ?GI: Soft, mild epigastric and bilateral lower quadrant tenderness, Nondistended. ?MSK: no edema, cyanosis, or clubbing noted ?Skin: warm, dry ?Neuro: grossly normal, moves all extremities ?Psych: Normal affect and thought content ? ? ?No results found for any visits on 02/25/22. ? ? ?   ?Assessment & Plan:  ?Ongoing abdominal pain, vomiting, acute on chronic diarrhea ?Initially sounded like acute viral gastroenteritis, but the intermittent improvement  followed by worsening symptoms coupled with patient's history of abdominal surgery and chronic diarrhea there is concern for possible worsening intra-abdominal process ?We will get labs to assess for electrolyte abnormalities, liver function, pancreatic function: CMP, lipase, stool PCR ?We will get a abdominal ultrasound to assess for possible obstructive type processes such as hernia, if negative, cannot rule out intermittent SBO: Abdominal ultrasound, can consider CT abdomen in the future ?We will continue to medicate: Zofran, cholestyramine, loperamide ?We will treat new symptoms of GERD (likely from acute vomiting): Omeprazole ?Given the acute on chronic nature of symptoms we will also refer to GI ? ?Problem List Items Addressed This Visit   ? ?  ? Digestive  ? GERD (gastroesophageal reflux disease)  ? Relevant Medications  ? loperamide (IMODIUM) 2 MG capsule  ? ondansetron (ZOFRAN) 4 MG tablet  ? omeprazole (PRILOSEC) 40 MG capsule  ? Other Relevant Orders  ? Ambulatory referral to Gastroenterology  ? Comp Met (CMET)  ? Lipase  ? GI Profile, Stool, PCR  ? FAST [place]  ?  ? Other  ? Severe recurrent major depression without psychotic features ([place])  ? ?Other Visit Diagnoses   ? ? Abdominal pain, unspecified abdominal location    -  Primary  ? Relevant Medications  ? loperamide (IMODIUM) 2 MG capsule  ? ondansetron (ZOFRAN) 4 MG tablet  ? omeprazole (PRILOSEC) 40 MG capsule  ?  Other Relevant Orders  ? Ambulatory referral to Gastroenterology  ? Comp Met (CMET)  ? Lipase  ? GI Profile, Stool, PCR  ? FAST [place]  ? Vomiting without nausea, unspecified vomiting type      ? Relevant Medications  ? loperamide (IMODIUM) 2 MG capsule  ? ondansetron (ZOFRAN) 4 MG tablet  ? omeprazole (PRILOSEC) 40 MG capsule  ? Other Relevant Orders  ? Ambulatory referral to Gastroenterology  ? Comp Met (CMET)  ? Lipase  ? GI Profile, Stool, PCR  ? FAST [place]  ? Diarrhea, unspecified type      ? Relevant Medications  ? loperamide (IMODIUM) 2 MG  capsule  ? ondansetron (ZOFRAN) 4 MG tablet  ? omeprazole (PRILOSEC) 40 MG capsule  ? Other Relevant Orders  ? Ambulatory referral to Gastroenterology  ? Comp Met (CMET)  ? Lipase  ? GI Profile, Stool, PCR  ? FAST [place]  ? ?  ? ? ?Meds ordered this encounter  ?Medications  ? loperamide (IMODIUM) 2 MG capsule  ?  Sig: Take 2 capsules (4 mg total) by mouth as needed for diarrhea or loose stools. Do not take more than 16 mg in a day (8 tablets)  ?  Dispense:  30 capsule  ?  Refill:  0  ? ondansetron (ZOFRAN) 4 MG tablet  ?  Sig: Take 1 tablet (4 mg total) by mouth every 8 (eight) hours as needed for nausea or vomiting.  ?  Dispense:  20 tablet  ?  Refill:  0  ? omeprazole (PRILOSEC) 40 MG capsule  ?  Sig: Take 1 capsule (40 mg total) by mouth daily.  ?  Dispense:  30 capsule  ?  Refill:  3  ?Return discussions discussed ? ? ?Bonnita Hollow, MD ? ? ?

## 2022-02-25 NOTE — Assessment & Plan Note (Signed)
Elevated PHQ-9: 17 ?Worsening due to loss of friend over the weekend ?Encourage patient to follow-up with psychiatry ?

## 2022-03-01 ENCOUNTER — Other Ambulatory Visit: Payer: BC Managed Care – PPO

## 2022-03-01 DIAGNOSIS — R1111 Vomiting without nausea: Secondary | ICD-10-CM

## 2022-03-01 DIAGNOSIS — R197 Diarrhea, unspecified: Secondary | ICD-10-CM

## 2022-03-01 DIAGNOSIS — R109 Unspecified abdominal pain: Secondary | ICD-10-CM

## 2022-03-01 DIAGNOSIS — K219 Gastro-esophageal reflux disease without esophagitis: Secondary | ICD-10-CM

## 2022-03-01 NOTE — Addendum Note (Signed)
Addended by: Varney Biles on: 03/01/2022 02:04 PM ? ? Modules accepted: Orders ? ?

## 2022-03-01 NOTE — Addendum Note (Signed)
Addended by: Varney Biles on: 03/01/2022 02:03 PM ? ? Modules accepted: Orders ? ?

## 2022-03-02 LAB — COMPREHENSIVE METABOLIC PANEL
AG Ratio: 1.6 (calc) (ref 1.0–2.5)
ALT: 33 U/L — ABNORMAL HIGH (ref 6–29)
AST: 29 U/L (ref 10–35)
Albumin: 4 g/dL (ref 3.6–5.1)
Alkaline phosphatase (APISO): 71 U/L (ref 37–153)
BUN: 16 mg/dL (ref 7–25)
CO2: 23 mmol/L (ref 20–32)
Calcium: 8.8 mg/dL (ref 8.6–10.4)
Chloride: 107 mmol/L (ref 98–110)
Creat: 0.73 mg/dL (ref 0.50–1.03)
Globulin: 2.5 g/dL (calc) (ref 1.9–3.7)
Glucose, Bld: 144 mg/dL — ABNORMAL HIGH (ref 65–99)
Potassium: 3.7 mmol/L (ref 3.5–5.3)
Sodium: 141 mmol/L (ref 135–146)
Total Bilirubin: 0.3 mg/dL (ref 0.2–1.2)
Total Protein: 6.5 g/dL (ref 6.1–8.1)

## 2022-03-02 LAB — LIPASE: Lipase: 36 U/L (ref 7–60)

## 2022-03-04 ENCOUNTER — Ambulatory Visit: Payer: BC Managed Care – PPO | Admitting: Family Medicine

## 2022-03-04 ENCOUNTER — Encounter: Payer: Self-pay | Admitting: Family Medicine

## 2022-03-04 ENCOUNTER — Telehealth: Payer: Self-pay | Admitting: Family Medicine

## 2022-03-04 VITALS — BP 133/91 | HR 82 | Temp 96.5°F | Ht 68.0 in | Wt 305.0 lb

## 2022-03-04 DIAGNOSIS — K219 Gastro-esophageal reflux disease without esophagitis: Secondary | ICD-10-CM | POA: Diagnosis not present

## 2022-03-04 DIAGNOSIS — R109 Unspecified abdominal pain: Secondary | ICD-10-CM

## 2022-03-04 DIAGNOSIS — R1111 Vomiting without nausea: Secondary | ICD-10-CM

## 2022-03-04 DIAGNOSIS — R112 Nausea with vomiting, unspecified: Secondary | ICD-10-CM

## 2022-03-04 DIAGNOSIS — R197 Diarrhea, unspecified: Secondary | ICD-10-CM

## 2022-03-04 DIAGNOSIS — G8929 Other chronic pain: Secondary | ICD-10-CM

## 2022-03-04 MED ORDER — DICYCLOMINE HCL 10 MG PO CAPS: 10.0000 mg | ORAL_CAPSULE | Freq: Three times a day (TID) | ORAL | 0 refills | Status: DC

## 2022-03-04 MED ORDER — ONDANSETRON HCL 4 MG PO TABS: 4.0000 mg | ORAL_TABLET | Freq: Three times a day (TID) | ORAL | 0 refills | Status: DC

## 2022-03-04 NOTE — Progress Notes (Signed)
? ?  Regina Morales is a 61 y.o. female who presents today for an office visit. ? ?Assessment/Plan:  ?New/Acute Problems: ?Acute on chronic abdominal pain ?Mildly improving, but still persistent nausea and diarrhea ?Tolerating some p.o., weight and vitals stable ?CMP and lipase were negative, stool PCR still pending ?Given persistence and history of abdominal surgery including cholecystectomy, will pursue CT abdomen/pelvis, as cannot rule out SBO or hernia (although not present on exam, but exam is limited due to central adiposity ?We will avoid prokinetic agents like metoclopramide given patient's history of dyskinesia ?Continue Zofran 4 mg 3 times daily as needed ?continue Protonix 40 mg daily ?Add Bentyl 10 mg with meals as needed abdominal cramping ?Return precautions discussed ? ?Chronic Problems Addressed Today: ? ?  ?Subjective:  ?HPI: ? ?Patient follow-up for persistent nausea and vomiting and diarrhea.  Heartburn related symptoms are improved with Protonix.  Patient also reports that abdominal pain is shifted to left lower quadrant.  Vomiting has improved with Zofran, however patient is out of Zofran.  Patient diarrhea has slowed down a little bit, but still persistent on loperamide and cholestyramine.  Denies vomiting of blood, denies blood in stool.  Is tolerating some p.o., following "BRAT" diet, but finds that this can upset her stomach.  ? ?   ?  ?Objective:  ?Physical Exam: ?BP (!) 133/91 (BP Location: Right Arm, Patient Position: Sitting, Cuff Size: Large)   Pulse 82   Temp (!) 96.5 ?F (35.8 ?C) (Temporal)   Wt (!) 305 lb (138.3 kg)   SpO2 97%   BMI 46.38 kg/m?   ?Gen: No acute distress, resting comfortably ?CV: Regular rate and rhythm with no murmurs appreciated ?Pulm: Normal work of breathing, clear to auscultation bilaterally with no crackles, wheezes, or rhonchi ?Abdominal: Soft, left lower quadrant mild tender, no rebound or guarding ?Neuro: Grossly normal, moves all extremities ?Psych:  Normal affect and thought content ?   ? ?Fanny Bien ?03/04/2022 3:50 PM  ?

## 2022-03-04 NOTE — Patient Instructions (Signed)
For abdominal pain, please take the dicyclomine. ?For the nausea, please take Zofran. ?We are ordering a CT scan. ?

## 2022-03-04 NOTE — Telephone Encounter (Signed)
We received paperwork for provider to fill out, I place in Dr Ronnell Freshwater folder up front ?

## 2022-03-05 LAB — GI PROFILE, STOOL, PCR

## 2022-03-05 NOTE — Progress Notes (Signed)
Stool test negative for infection

## 2022-03-07 ENCOUNTER — Ambulatory Visit: Payer: BC Managed Care – PPO | Admitting: Family Medicine

## 2022-03-07 ENCOUNTER — Ambulatory Visit (INDEPENDENT_AMBULATORY_CARE_PROVIDER_SITE_OTHER): Payer: BC Managed Care – PPO | Admitting: Family Medicine

## 2022-03-07 ENCOUNTER — Telehealth: Payer: Self-pay | Admitting: Family Medicine

## 2022-03-07 ENCOUNTER — Encounter: Payer: Self-pay | Admitting: Family Medicine

## 2022-03-07 VITALS — BP 134/88 | HR 72 | Temp 97.6°F | Resp 16 | Wt 308.0 lb

## 2022-03-07 DIAGNOSIS — R1032 Left lower quadrant pain: Secondary | ICD-10-CM

## 2022-03-07 DIAGNOSIS — R197 Diarrhea, unspecified: Secondary | ICD-10-CM

## 2022-03-07 NOTE — Progress Notes (Signed)
?[facility] ?[facility] ?[place] ?[place] [address] ?Dept: [phone] ?Dept Fax: [phone] ? ?Office Visit ? ?Subjective:  ? ? Patient ID: Regina Morales, female    DOB: 06-Aug-1961, 61 y.o..   MRN: [id number] ? ?Chief Complaint  ?Patient presents with  ? Follow-up  ?  Patient was seen 5/1 for nausea, vomiting, diarrhea and abdominal pain. Patient states that symptoms are the same with no improvement.   ? ? ?History of Present Illness: ? ?Patient is in today for reassessment of her abdominal pain associated with nausea, vomiting, and diarrhea. [name] had onset of pain around 4/15 after her dog jumped on her abdomen. The initial pain was more in the suprapubic area. More recently, it is more localized to the LLQ. She has had nausea and vomiting, though she notes the vomiting has decreased. She does get nausea with eating food. She also has continued to have diarrhea. She has had no blood in her stools. [name] had a cholecystectomy in Nov. 2022. She did experience post cholecystectomy diarrhea that was managed with cholestyramine. She notes this had resolved that issue. She had an initial video visit and subsequently two office visits with Dr. Grandville Silos. She has been managed with Zofran, dicyclomine, and loperamide. ? ?Past Medical History: ?Patient Active Problem List  ? Diagnosis Date Noted  ? Postcholecystectomy diarrhea 12/07/2021  ? Sensorineural hearing loss (SNHL) of both ears 12/07/2021  ? Urinary incontinence 12/07/2021  ? Epigastric abdominal pain 08/20/2021  ? Diverticulitis 06/08/2021  ? Lumbar degenerative disc disease 05/28/2021  ? Mild spinal stenosis at L4-L5 level 05/28/2021  ? Severe recurrent major depression without psychotic features ([place]) 04/30/2020  ? OSA (obstructive sleep apnea) 07/27/2019  ? Stage 3 severe COPD by GOLD classification ([place]) 06/24/2019  ? Fibromyalgia 05/03/2019  ? Venous insufficiency of both lower extremities  04/29/2019  ? Visit for monitoring Tikosyn therapy 09/15/2018  ? Endometrial polyp   ? Normocytic anemia 04/24/2014  ? Migraine 04/22/2014  ? PAF (paroxysmal atrial fibrillation) ([place])   ? GERD (gastroesophageal reflux disease)   ? BMI 40.0-44.9, adult ([place])   ? ?Past Surgical History:  ?Procedure Laterality Date  ? APPENDECTOMY  1990's  ? BREAST CYST ASPIRATION Left 2019  ? @ [facility]  ? CHOLECYSTECTOMY N/A 09/14/2021  ? Procedure: LAPAROSCOPIC CHOLECYSTECTOMY WITH INTRAOPERATIVE CHOLANGIOGRAM;  Surgeon: Dwan Bolt, MD;  Location: [facility];  Service: General;  Laterality: N/A;  ? COLONOSCOPY    ? DILATATION & CURETTAGE/HYSTEROSCOPY WITH MYOSURE N/A 04/14/2017  ? Procedure: DILATATION & CURETTAGE/HYSTEROSCOPY WITH MYOSURE;  Surgeon: Janyth Pupa, DO;  Location: [facility];  Service: Gynecology;  Laterality: N/A;  Polypectomy  ? DILATION AND CURETTAGE OF UTERUS  1983  ? mab  ? ERCP N/A 09/15/2021  ? Procedure: ENDOSCOPIC RETROGRADE CHOLANGIOPANCREATOGRAPHY (ERCP);  Surgeon: Clarene Essex, MD;  Location: [facility];  Service: Endoscopy;  Laterality: N/A;  ? INNER EAR SURGERY Right 2000's  ? LEFT HEART CATH AND CORONARY ANGIOGRAPHY N/A 04/17/2017  ? Procedure: Left Heart Cath and Coronary Angiography;  Surgeon: Jettie Booze, MD;  Location: [facility];  Service: Cardiovascular;  Laterality: N/A;  ? PLANTAR FASCIA RELEASE Right 07/2015  ? REMOVAL OF STONES  09/15/2021  ? Procedure: REMOVAL OF STONES;  Surgeon: Clarene Essex, MD;  Location: [facility];  Service: Endoscopy;;  ? SPHINCTEROTOMY  09/15/2021  ? Procedure: SPHINCTEROTOMY;  Surgeon: Clarene Essex, MD;  Location: [facility];  Service: Endoscopy;;  ? TUBAL LIGATION  1985  ? ULNAR NERVE TRANSPOSITION Right   ? ?Family History  ?Problem Relation Age of Onset  ? Lung cancer Father   ? Alcohol abuse Father   ? Kidney failure Mother   ?     s/p renal Tx  ? Congestive Heart Failure Mother   ? Atrial fibrillation Brother   ? Sudden Cardiac Death Daughter    ?     cardiac arrest  ? Cardiomyopathy Daughter   ? Chadwicks White syndrome Grandchild   ?      granddaughter  ? ?Outpatient Medications Prior to Visit  ?Medication Sig Dispense Refill  ? acetaminophen (TYLENOL) 500 MG tablet Take 2 tablets (1,000 mg total) by mouth 3 (three) times daily as needed for mild pain. 30 tablet 0  ? albuterol (VENTOLIN HFA) 108 (90 Base) MCG/ACT inhaler Inhale 2 puffs into the lungs every 6 (six) hours as needed for wheezing or shortness of breath.    ? apixaban (ELIQUIS) 5 MG TABS tablet Take 1 tablet (5 mg total) by mouth 2 (two) times daily. 180 tablet 1  ? cholestyramine (QUESTRAN) 4 g packet Take by mouth.    ? dicyclomine (BENTYL) 10 MG capsule Take 1 capsule (10 mg total) by mouth 4 (four) times daily -  before meals and at bedtime for 14 days. 56 capsule 0  ? divalproex (DEPAKOTE ER) 250 MG 24 hr tablet Take 250 mg by mouth See admin instructions. Take with 500 mg for a total on 750 mg in the evening    ? divalproex (DEPAKOTE ER) 500 MG 24 hr tablet Take 500 mg by mouth See admin instructions. Take with 250 mg for a total of 750 mg in the evening    ? dofetilide (TIKOSYN) 500 MCG capsule TAKE 1 CAPSULE(500 MCG) BY MOUTH TWICE DAILY 180 capsule 2  ? DULoxetine (CYMBALTA) 60 MG capsule Take 120 mg by mouth daily.    ? gabapentin (NEURONTIN) 100 MG capsule SMARTSIG:2 Capsule(s) By Mouth Every Evening    ? LATUDA 60 MG TABS Take 60 mg by mouth at bedtime.    ? loperamide (IMODIUM) 2 MG capsule Take 2 capsules (4 mg total) by mouth as needed for diarrhea or loose stools. Do not take more than 16 mg in a day (8 tablets) 30 capsule 0  ? metoprolol tartrate (LOPRESSOR) 50 MG tablet TAKE 1 TABLET TWICE A DAY 180 tablet 3  ? mirabegron ER (MYRBETRIQ) 50 MG TB24 tablet Take 50 mg by mouth daily.    ? omeprazole (PRILOSEC) 40 MG capsule Take 1 capsule (40 mg total) by mouth daily. 30 capsule 3  ? rizatriptan (MAXALT) 10 MG tablet Take 10 mg by mouth as needed for migraine. May repeat  in 2 hours if needed    ? tolterodine (DETROL LA) 4 MG 24 hr capsule Take 4 mg by mouth daily.    ? topiramate (TOPAMAX) 50 MG tablet Take 50 mg by mouth 2 (two) times daily.    ? ondansetron (ZOFRAN) 4 MG tablet Take 1 tablet (4 mg total) by mouth every 8 (eight) hours for 14 days. (Patient not taking: Reported on 03/07/2022) 42 tablet 0  ? ?No facility-administered medications prior to visit.  ? ?Allergies  ?Allergen Reactions  ? Codeine Nausea And Vomiting, Other (See Comments) and Nausea Only  ? ?Objective:  ? ?Today's Vitals  ? 03/07/22 0903  ?BP: 134/88  ?Pulse: 72  ?Resp: 16  ?Temp: 97.6 ?F (36.4 ?C)  ?TempSrc: Temporal  ?SpO2:  99%  ?Weight: (!) 308 lb (139.7 kg)  ? ?Body mass index is 46.83 kg/m?.  ? ?General: Well developed, well nourished. No acute distress. ?Abdomen: Soft. Moderate tenderness in the LLQ with mild guarding.  Milder pain in the upper  ? abdomen. Bowel sounds positive, normal pitch and frequency. No hepatosplenomegaly.  ?Psych: Alert and oriented. Normal mood and affect. ? ?Health Maintenance Due  ?Topic Date Due  ? Hepatitis C Screening  Never done  ? COVID-19 Vaccine (3 - Moderna risk series) 07/31/2020  ? Zoster Vaccines- Shingrix (2 of 2) 06/22/2021  ?   ?Lab Results: ?Component Ref Range & Units 6 d ago  ?Campylobacter Not Detected Not Detected   ?C difficile toxin A/B Not Detected Not Detected   ?Plesiomonas shigelloides Not Detected Not Detected   ?Salmonella Not Detected Not Detected   ?Vibrio Not Detected Not Detected   ?Vibrio cholerae Not Detected Not Detected   ?Yersinia enterocolitica Not Detected Not Detected   ?Enteroaggregative E coli Not Detected Not Detected   ?Enteropathogenic E coli Not Detected Not Detected   ?Enterotoxigenic E coli Not Detected Not Detected   ?Shiga-toxin-producing E coli Not Detected Not Detected   ?E coli O157 Not Detected Not applicable   ?Shigella/Enteroinvasive E coli Not Detected Not Detected   ?Cryptosporidium Not Detected Not Detected    ?Cyclospora cayetanensis Not Detected Not Detected   ?Entamoeba histolytica Not Detected Not Detected   ?Giardia lamblia Not Detected Not Detected   ?Adenovirus F 40/41 Not Detected Not Detected   ?Astrovirus Not [place] Must

## 2022-03-07 NOTE — Telephone Encounter (Signed)
Pt found a GI provider that is able to get her in. She would like referral to Jeani Hawking, MD.  ?

## 2022-03-12 ENCOUNTER — Ambulatory Visit: Payer: BC Managed Care – PPO | Admitting: Family Medicine

## 2022-03-12 ENCOUNTER — Telehealth: Payer: Self-pay

## 2022-03-12 VITALS — BP 130/84 | HR 80 | Temp 97.9°F | Ht 68.0 in | Wt 306.8 lb

## 2022-03-12 DIAGNOSIS — R197 Diarrhea, unspecified: Secondary | ICD-10-CM

## 2022-03-12 DIAGNOSIS — R1032 Left lower quadrant pain: Secondary | ICD-10-CM | POA: Diagnosis not present

## 2022-03-12 MED ORDER — ONDANSETRON HCL 4 MG PO TABS: 4.0000 mg | ORAL_TABLET | Freq: Three times a day (TID) | ORAL | 0 refills | Status: AC

## 2022-03-12 MED ORDER — LOPERAMIDE HCL 2 MG PO CAPS: 4.0000 mg | ORAL_CAPSULE | ORAL | 0 refills | Status: DC | PRN

## 2022-03-12 NOTE — Telephone Encounter (Signed)
DR Veto Kemps placed a new order for a Stat Ct at last OV (03/07/22)  due to still having issues.  Dm/cma ? ?

## 2022-03-12 NOTE — Progress Notes (Signed)
?[facility] ?[facility] ?[place] ?[place] [address] ?Dept: [phone] ?Dept Fax: [phone] ? ?Office Visit ? ?Subjective:  ? ? Patient ID: Regina Morales, female    DOB: 28-Apr-1961, 61 y.o..   MRN: [id number] ? ?Chief Complaint  ?Patient presents with  ? Follow-up  ?  F/u still having abdominal pain and N&V.    ? ? ?History of Present Illness: ? ?Patient is in today for reassessment of her abdominal pain associated with nausea, vomiting, and diarrhea. [name] continues to have several diarrheal stools each day (3 yesterday). She denies any hematochezia. She continues to have nausea with eating. She is eating light foods, such as applesauce and toast. She continues to not run any fever. She notes she does have an appointment with Dr. Benson Morales (GI) tomorrow. ? ?Past Medical History: ?Patient Active Problem List  ? Diagnosis Date Noted  ? Postcholecystectomy diarrhea 12/07/2021  ? Sensorineural hearing loss (SNHL) of both ears 12/07/2021  ? Urinary incontinence 12/07/2021  ? Epigastric abdominal pain 08/20/2021  ? Diverticulitis 06/08/2021  ? Lumbar degenerative disc disease 05/28/2021  ? Mild spinal stenosis at L4-L5 level 05/28/2021  ? Severe recurrent major depression without psychotic features ([place]) 04/30/2020  ? OSA (obstructive sleep apnea) 07/27/2019  ? Stage 3 severe COPD by GOLD classification ([place]) 06/24/2019  ? Fibromyalgia 05/03/2019  ? Venous insufficiency of both lower extremities 04/29/2019  ? Visit for monitoring Tikosyn therapy 09/15/2018  ? Endometrial polyp   ? Normocytic anemia 04/24/2014  ? Migraine 04/22/2014  ? PAF (paroxysmal atrial fibrillation) ([place])   ? GERD (gastroesophageal reflux disease)   ? BMI 40.0-44.9, adult ([place])   ? ?Past Surgical History:  ?Procedure Laterality Date  ? APPENDECTOMY  1990's  ? BREAST CYST ASPIRATION Left 2019  ? @ [facility]  ? CHOLECYSTECTOMY N/A 09/14/2021  ? Procedure: LAPAROSCOPIC CHOLECYSTECTOMY WITH  INTRAOPERATIVE CHOLANGIOGRAM;  Surgeon: Dwan Bolt, MD;  Location: [facility];  Service: General;  Laterality: N/A;  ? COLONOSCOPY    ? DILATATION & CURETTAGE/HYSTEROSCOPY WITH MYOSURE N/A 04/14/2017  ? Procedure: DILATATION & CURETTAGE/HYSTEROSCOPY WITH MYOSURE;  Surgeon: Janyth Pupa, DO;  Location: [facility];  Service: Gynecology;  Laterality: N/A;  Polypectomy  ? DILATION AND CURETTAGE OF UTERUS  1983  ? mab  ? ERCP N/A 09/15/2021  ? Procedure: ENDOSCOPIC RETROGRADE CHOLANGIOPANCREATOGRAPHY (ERCP);  Surgeon: Clarene Essex, MD;  Location: [facility];  Service: Endoscopy;  Laterality: N/A;  ? INNER EAR SURGERY Right 2000's  ? LEFT HEART CATH AND CORONARY ANGIOGRAPHY N/A 04/17/2017  ? Procedure: Left Heart Cath and Coronary Angiography;  Surgeon: Jettie Booze, MD;  Location: [facility];  Service: Cardiovascular;  Laterality: N/A;  ? PLANTAR FASCIA RELEASE Right 07/2015  ? REMOVAL OF STONES  09/15/2021  ? Procedure: REMOVAL OF STONES;  Surgeon: Clarene Essex, MD;  Location: [facility];  Service: Endoscopy;;  ? SPHINCTEROTOMY  09/15/2021  ? Procedure: SPHINCTEROTOMY;  Surgeon: Clarene Essex, MD;  Location: [facility];  Service: Endoscopy;;  ? [place]  ? ULNAR NERVE TRANSPOSITION Right   ? ?Family History  ?Problem Relation Age of Onset  ? Lung cancer Father   ? Alcohol abuse Father   ? Kidney failure Mother   ?     s/p renal Tx  ? Congestive Heart Failure Mother   ? Atrial fibrillation Brother   ? Sudden Cardiac Death Daughter   ?     cardiac arrest  ? Cardiomyopathy Daughter   ?  Evelene Croon Parkinson White syndrome Grandchild   ?      granddaughter  ? ?Outpatient Medications Prior to Visit  ?Medication Sig Dispense Refill  ? acetaminophen (TYLENOL) 500 MG tablet Take 2 tablets (1,000 mg total) by mouth 3 (three) times daily as needed for mild pain. 30 tablet 0  ? albuterol (VENTOLIN HFA) 108 (90 Base) MCG/ACT inhaler Inhale 2 puffs into the lungs every 6 (six) hours as needed for wheezing or  shortness of breath.    ? apixaban (ELIQUIS) 5 MG TABS tablet Take 1 tablet (5 mg total) by mouth 2 (two) times daily. 180 tablet 1  ? cholestyramine (QUESTRAN) 4 g packet Take by mouth.    ? dicyclomine (BENTYL) 10 MG capsule Take 1 capsule (10 mg total) by mouth 4 (four) times daily -  before meals and at bedtime for 14 days. 56 capsule 0  ? divalproex (DEPAKOTE ER) 250 MG 24 hr tablet Take 250 mg by mouth See admin instructions. Take with 500 mg for a total on 750 mg in the evening    ? divalproex (DEPAKOTE ER) 500 MG 24 hr tablet Take 500 mg by mouth See admin instructions. Take with 250 mg for a total of 750 mg in the evening    ? dofetilide (TIKOSYN) 500 MCG capsule TAKE 1 CAPSULE(500 MCG) BY MOUTH TWICE DAILY 180 capsule 2  ? DULoxetine (CYMBALTA) 60 MG capsule Take 120 mg by mouth daily.    ? gabapentin (NEURONTIN) 100 MG capsule SMARTSIG:2 Capsule(s) By Mouth Every Evening    ? LATUDA 60 MG TABS Take 60 mg by mouth at bedtime.    ? metoprolol tartrate (LOPRESSOR) 50 MG tablet TAKE 1 TABLET TWICE A DAY 180 tablet 3  ? mirabegron ER (MYRBETRIQ) 50 MG TB24 tablet Take 50 mg by mouth daily.    ? omeprazole (PRILOSEC) 40 MG capsule Take 1 capsule (40 mg total) by mouth daily. 30 capsule 3  ? rizatriptan (MAXALT) 10 MG tablet Take 10 mg by mouth as needed for migraine. May repeat in 2 hours if needed    ? tolterodine (DETROL LA) 4 MG 24 hr capsule Take 4 mg by mouth daily.    ? topiramate (TOPAMAX) 50 MG tablet Take 50 mg by mouth 2 (two) times daily.    ? loperamide (IMODIUM) 2 MG capsule Take 2 capsules (4 mg total) by mouth as needed for diarrhea or loose stools. Do not take more than 16 mg in a day (8 tablets) 30 capsule 0  ? ondansetron (ZOFRAN) 4 MG tablet Take 1 tablet (4 mg total) by mouth every 8 (eight) hours for 14 days. 42 tablet 0  ? ?No facility-administered medications prior to visit.  ? ?Allergies  ?Allergen Reactions  ? Codeine Nausea And Vomiting, Other (See Comments) and Nausea Only  ?    ?Objective:  ? ?Today's Vitals  ? 03/12/22 1022  ?BP: 130/84  ?Pulse: 80  ?Temp: 97.9 ?F (36.6 ?C)  ?TempSrc: Temporal  ?SpO2: 97%  ?Weight: (!) 306 lb 12.8 oz (139.2 kg)  ?Height: 5\' 8"  (1.727 m)  ? ?Body mass index is 46.65 kg/m?.  ? ?General: Well developed, well nourished. No acute distress. ?Psych: Alert and oriented. Normal mood and affect. ? ?Health Maintenance Due  ?Topic Date Due  ? Hepatitis C Screening  Never done  ? Zoster Vaccines- Shingrix (2 of 2) 06/22/2021  ?   ?Assessment & Plan:  ? ?1. Left lower quadrant abdominal pain ?Persistent pain is present. I  had ordered a UA at her visit last week (03/07/22), but this did not get collected. We will obtain this today. I had also tried to get her CT, originally scheduled for 5/23, moved up. Scheduling has not acted on this as of yet.  My MA has spoken with the scheduler to emphasize the more urgent need for completing this. ? ?2. Diarrhea, unspecified type ?I will continue PRN loperamide. Also, I will provide Zofran for managing the nausea. ? ?- ondansetron (ZOFRAN) 4 MG tablet; Take 1 tablet (4 mg total) by mouth every 8 (eight) hours for 14 days.  Dispense: 42 tablet; Refill: 0 ?- loperamide (IMODIUM) 2 MG capsule; Take 2 capsules (4 mg total) by mouth as needed for diarrhea or loose stools. Do not take more than 16 mg in a day (8 tablets)  Dispense: 30 capsule; Refill: 0 ? ? ?Return for After testing/imaging.  ? ?Haydee Salter, MD ?

## 2022-03-12 NOTE — Telephone Encounter (Signed)
Patient is coming for a f/u with [place] today but the CT scan hasn't been done.  Can you please and thank you check on this?  Dm/cma ? ?

## 2022-03-13 ENCOUNTER — Telehealth: Payer: Self-pay | Admitting: Family Medicine

## 2022-03-13 LAB — URINALYSIS W MICROSCOPIC + REFLEX CULTURE
Bacteria, UA: NONE SEEN /HPF
Bilirubin Urine: NEGATIVE
Glucose, UA: NEGATIVE
Hgb urine dipstick: NEGATIVE
Hyaline Cast: NONE SEEN /LPF
Ketones, ur: NEGATIVE
Leukocyte Esterase: NEGATIVE
Nitrites, Initial: NEGATIVE
Protein, ur: NEGATIVE
RBC / HPF: NONE SEEN /HPF (ref 0–2)
Specific Gravity, Urine: 1.012 (ref 1.001–1.035)
WBC, UA: NONE SEEN /HPF (ref 0–5)
pH: 6.5 (ref 5.0–8.0)

## 2022-03-13 LAB — NO CULTURE INDICATED

## 2022-03-13 NOTE — Telephone Encounter (Signed)
Pt called requesting yesterday's office notes 03/12/22 be sent to [facility] (where her disability forms were recently sent).  ?

## 2022-03-14 ENCOUNTER — Telehealth: Payer: Self-pay | Admitting: *Deleted

## 2022-03-14 NOTE — Telephone Encounter (Signed)
Pt agreeable to tele pre op appt 03/15/22 @ 10 am. Med rec and consent are done.  ? ? ?  ?Patient Consent for Virtual Visit  ? ? ?   ? ?Regina Morales has provided verbal consent on 03/14/2022 for a virtual visit (video or telephone). ? ? ?CONSENT FOR VIRTUAL VISIT FOR:  Regina Morales  ?By participating in this virtual visit I agree to the following: ? ?I hereby voluntarily request, consent and authorize [facility] and its employed or contracted physicians, physician assistants, nurse practitioners or other licensed health care professionals (the Practitioner), to provide me with telemedicine health care services (the ?Services") as deemed necessary by the treating Practitioner. I acknowledge and consent to receive the Services by the Practitioner via telemedicine. I understand that the telemedicine visit will involve communicating with the Practitioner through live audiovisual communication technology and the disclosure of certain medical information by electronic transmission. I acknowledge that I have been given the opportunity to request an in-person assessment or other available alternative prior to the telemedicine visit and am voluntarily participating in the telemedicine visit. ? ?I understand that I have the right to withhold or withdraw my consent to the use of telemedicine in the course of my care at any time, without affecting my right to future care or treatment, and that the Practitioner or I may terminate the telemedicine visit at any time. I understand that I have the right to inspect all information obtained and/or recorded in the course of the telemedicine visit and may receive copies of available information for a reasonable fee.  I understand that some of the potential risks of receiving the Services via telemedicine include:  ?Delay or interruption in medical evaluation due to technological equipment failure or disruption; ?Information transmitted may not be sufficient (e.g. poor  resolution of images) to allow for appropriate medical decision making by the Practitioner; and/or  ?In rare instances, security protocols could fail, causing a breach of personal health information. ? ?Furthermore, I acknowledge that it is my responsibility to provide information about my medical history, conditions and care that is complete and accurate to the best of my ability. I acknowledge that Practitioner's advice, recommendations, and/or decision may be based on factors not within their control, such as incomplete or inaccurate data provided by me or distortions of diagnostic images or specimens that may result from electronic transmissions. I understand that the practice of medicine is not an exact science and that Practitioner makes no warranties or guarantees regarding treatment outcomes. I acknowledge that a copy of this consent can be made available to me via my patient portal [facility] MyChart), or I can request a printed copy by calling the office of [facility].   ? ?I understand that my insurance will be billed for this visit.  ? ?I have read or had this consent read to me. ?I understand the contents of this consent, which adequately explains the benefits and risks of the Services being provided via telemedicine.  ?I have been provided ample opportunity to ask questions regarding this consent and the Services and have had my questions answered to my satisfaction. ?I give my informed consent for the services to be provided through the use of telemedicine in my medical care ? ? ? ?

## 2022-03-14 NOTE — Telephone Encounter (Signed)
Patient with diagnosis of atrial fibrillation on Eliquis for anticoagulation.   ? ?Procedure: colonoscopy ?Date of procedure: 03/26/22 ? ? ?CHA2DS2-VASc Score = 2  ? This indicates a 2.2% annual risk of stroke. ?The patient's score is based upon: ?CHF History: 0 ?HTN History: 1 ?Diabetes History: 0 ?Stroke History: 0 ?Vascular Disease History: 0 (hx of DVT in pregnancy) ?Age Score: 0 ?Gender Score: 1 ?  ? ?CrCl 123 ?Platelet count 159 ? ?Note: chart indicates prior DVT/PE, but was provoked (with pregnancy) in 1980's ? ?Per office protocol, patient can hold Elliquis for 2 days prior to procedure.   ?Patient will not need bridging with Lovenox (enoxaparin) around procedure. ? ? ? ?

## 2022-03-14 NOTE — Telephone Encounter (Signed)
? ?  Pre-operative Risk Assessment  ?  ?Patient Name: Regina Morales  ?DOB: 12/25/1960 ?MRN: [id number]  ? ?  ? ?Request for Surgical Clearance   ? ?Procedure:   COLONOSCOPY ? ?Date of Surgery:  Clearance 03/26/22                              ?   ?Surgeon:  DR. HUNG ?Surgeon's Group or Practice Name:  [facility]  ?Phone number:  [phone] ?Fax number:  [phone] ?  ?Type of Clearance Requested:   ?- Medical  ?- Pharmacy:  Hold Apixaban (Eliquis)   ?  ?Type of Anesthesia:   PROPOFOL ?  ?Additional requests/questions:   ? ?Signed, ?Danielle Rankin   ?03/14/2022, 8:07 AM  ? ?

## 2022-03-14 NOTE — Telephone Encounter (Signed)
Her Eliquis may be held for 2 days prior to her procedure. ? ?Preoperative team, please contact this patient and set up a phone call appointment for further cardiac evaluation.  Thank you for your help. ? ?Jossie Ng. Severin Bou NP-C ? ?  ?03/14/2022, 12:45 PM ?[facility] ?[address] ?Office [phone] Fax [phone] ? ?

## 2022-03-14 NOTE — Telephone Encounter (Signed)
Last OV note faxed to [employer] @ [phone]. Dm/cma ? ?

## 2022-03-14 NOTE — Telephone Encounter (Signed)
Pt agreeable to tele pre op appt 03/15/22 @ 10 am. Med rec and consent are done.  ?

## 2022-03-15 ENCOUNTER — Ambulatory Visit (INDEPENDENT_AMBULATORY_CARE_PROVIDER_SITE_OTHER): Payer: BC Managed Care – PPO | Admitting: General Practice

## 2022-03-15 DIAGNOSIS — Z01810 Encounter for preprocedural cardiovascular examination: Secondary | ICD-10-CM

## 2022-03-15 NOTE — Progress Notes (Signed)
S/W PT TODAY AND PT HAS BEEN INFORMED THE PRE OP PROVIDER TRIED TO CALL HER x 3 TODAY FOR PRE OP APPT, CALLS WENT TO VM. I HAVE RESCHEDULED THE PT FOR 03/18/22 @ 11 AM. PROCEDURE IS SET FOR 03/26/22.  ?

## 2022-03-15 NOTE — Progress Notes (Signed)
Attempted to contact patient as part of preoperative protocol for preoperative phone evaluation.  Unable to contact patient x3.  Patient will need to reschedule their preoperative phone evaluation. ? ?Thomasene Ripple. Casidee Jann NP-C ? ?  ?03/15/2022, 10:17 AM ?[facility] ?[address] ?Office [phone] Fax [phone] ? ?

## 2022-03-18 ENCOUNTER — Ambulatory Visit (INDEPENDENT_AMBULATORY_CARE_PROVIDER_SITE_OTHER): Payer: BC Managed Care – PPO | Admitting: Nurse Practitioner

## 2022-03-18 DIAGNOSIS — Z01810 Encounter for preprocedural cardiovascular examination: Secondary | ICD-10-CM

## 2022-03-18 NOTE — Progress Notes (Signed)
? ?Virtual Visit via Telephone Note  ? ?This visit type was conducted due to national recommendations for restrictions regarding the COVID-19 Pandemic (e.g. social distancing) in an effort to limit this patient's exposure and mitigate transmission in our community.  Due to her co-morbid illnesses, this patient is at least at moderate risk for complications without adequate follow up.  This format is felt to be most appropriate for this patient at this time.  The patient did not have access to video technology/had technical difficulties with video requiring transitioning to audio format only (telephone).  All issues noted in this document were discussed and addressed.  No physical exam could be performed with this format.  Please refer to the patient's chart for her  consent to telehealth for [facility]. ? ?Evaluation Performed:  Preoperative cardiovascular risk assessment ?_____________  ? ?Date:  03/18/2022  ? ?Patient ID:  Regina Morales, DOB [birth year], MRN [id number] ?Patient Location:  ?Home ?Provider location:   ?Office ? ?Primary Care Provider:  Haydee Salter, MD ?Primary Cardiologist:  Jenkins Rouge, MD ? ?Chief Complaint  ?  ?61 y.o. y/o female with a h/o paroxysmal atrial fibrillation on Eliquis and Tikosyn (CHA2DS2VASc = 4), hypertension, hyperlipidemia, OSA on CPAP, DVT/PE (c/ pregnancy), GERD, and fibromyalgia who is pending colonoscopy scheduled for 03/26/2022 with Dr. Benson Norway of [facility], and presents today for telephonic preoperative cardiovascular risk assessment. ? ?Past Medical History  ?  ?Past Medical History:  ?Diagnosis Date  ? Anemia   ? hx years ago  ? Anxiety   ? years ago  ? Buzzing in ear   ? "right; even after OR"  ? Chest pain   ? a. 2009: neg MV  ([place])  ? COPD (chronic obstructive pulmonary disease) ([place]) 2018  ? Depression   ? DVT (deep vein thrombosis) in pregnancy 1980's  ? LLE  ? Fibromyalgia   ? "dx'd many many years ago; I haven't had any problems w/it"  (04/15/2017)  ? GERD (gastroesophageal reflux disease)   ? Hepatic steatosis   ? High cholesterol   ? History of blood transfusion   ? "low count after appendix surgery" unsure # of units transfused  ? Hypertension   ? Migraines   ? "maybe once/2 months" (04/15/2017)  ? Obesity   ? OSA on CPAP   ? Persistent atrial fibrillation ([place])   ? a. Dx 07/2013;  b. CHA2DS2VASc=1 (female);  c. 07/2013 Echo:  EF 50-55%, no rwma, mod LVH.  ? Pulmonary embolism ([place]) ~ 1984  ? "after I had my last child"  ? SVD (spontaneous vaginal delivery)   ? x 4  ? ?Past Surgical History:  ?Procedure Laterality Date  ? APPENDECTOMY  1990's  ? BREAST CYST ASPIRATION Left 2019  ? @ [facility]  ? CHOLECYSTECTOMY N/A 09/14/2021  ? Procedure: LAPAROSCOPIC CHOLECYSTECTOMY WITH INTRAOPERATIVE CHOLANGIOGRAM;  Surgeon: Dwan Bolt, MD;  Location: [place];  Service: General;  Laterality: N/A;  ? COLONOSCOPY    ? DILATATION & CURETTAGE/HYSTEROSCOPY WITH MYOSURE N/A 04/14/2017  ? Procedure: DILATATION & CURETTAGE/HYSTEROSCOPY WITH MYOSURE;  Surgeon: Janyth Pupa, DO;  Location: [place] [facility];  Service: Gynecology;  Laterality: N/A;  Polypectomy  ? DILATION AND CURETTAGE OF UTERUS  1983  ? mab  ? ERCP N/A 09/15/2021  ? Procedure: ENDOSCOPIC RETROGRADE CHOLANGIOPANCREATOGRAPHY (ERCP);  Surgeon: Clarene Essex, MD;  Location: [place];  Service: Endoscopy;  Laterality: N/A;  ? INNER EAR SURGERY Right 2000's  ? LEFT HEART CATH AND CORONARY ANGIOGRAPHY N/A  04/17/2017  ? Procedure: Left Heart Cath and Coronary Angiography;  Surgeon: Jettie Booze, MD;  Location: [place] [facility];  Service: Cardiovascular;  Laterality: N/A;  ? PLANTAR FASCIA RELEASE Right 07/2015  ? REMOVAL OF STONES  09/15/2021  ? Procedure: REMOVAL OF STONES;  Surgeon: Clarene Essex, MD;  Location: [place];  Service: Endoscopy;;  ? SPHINCTEROTOMY  09/15/2021  ? Procedure: SPHINCTEROTOMY;  Surgeon: Clarene Essex, MD;  Location: [facility] ENDOSCOPY;  Service: Endoscopy;;  ? [place]  ?  ULNAR NERVE TRANSPOSITION Right   ? ? ?Allergies ? ?Allergies  ?Allergen Reactions  ? Codeine Nausea And Vomiting, Other (See Comments) and Nausea Only  ? ? ?History of Present Illness  ?  ?Regina Morales is a 61 y.o. female who presents via Engineer, civil (consulting) for a telehealth visit today.  Pt was last seen in cardiology clinic on 09/19/2021 by Tommye Standard, PA. At that time Regina Morales was doing well from a cardiac standpoint.  She denied symptoms concerning for angina, denied palpitations. She did note a tremor, however, this was thought to be secondary to medications, follow-up with psychiatry was recommended. The patient is now pending procedure as outlined above. Since her last visit, she has been stable from a cardiac standpoint. She denies chest pain, palpitations, dyspnea, pnd, orthopnea, n, v, dizziness, syncope, edema, weight gain, or early satiety.  Overall, she reports feeling well denies any new symptoms or concerns today. ? ?Home Medications  ?  ?Prior to Admission medications   ?Medication Sig Start Date End Date Taking? Authorizing Provider  ?acetaminophen (TYLENOL) 500 MG tablet Take 2 tablets (1,000 mg total) by mouth 3 (three) times daily as needed for mild pain. 09/16/21   Dwan Bolt, MD  ?albuterol (VENTOLIN HFA) 108 (90 Base) MCG/ACT inhaler Inhale 2 puffs into the lungs every 6 (six) hours as needed for wheezing or shortness of breath.    [provider]  ?apixaban (ELIQUIS) 5 MG TABS tablet Take 1 tablet (5 mg total) by mouth 2 (two) times daily. 02/06/22   Josue Hector, MD  ?cholestyramine Lucrezia Starch) 4 g packet Take by mouth. ?Patient not taking: Reported on 03/14/2022 10/09/21   [provider]  ?dicyclomine (BENTYL) 10 MG capsule Take 1 capsule (10 mg total) by mouth 4 (four) times daily -  before meals and at bedtime for 14 days. 03/04/22 03/18/22  Bonnita Hollow, MD  ?divalproex (DEPAKOTE ER) 250 MG 24 hr tablet Take 250 mg by mouth See admin  instructions. Take with 500 mg for a total on 750 mg in the evening    [provider]  ?divalproex (DEPAKOTE ER) 500 MG 24 hr tablet Take 500 mg by mouth See admin instructions. Take with 250 mg for a total of 750 mg in the evening 05/04/21   [provider]  ?dofetilide (TIKOSYN) 500 MCG capsule TAKE 1 CAPSULE(500 MCG) BY MOUTH TWICE DAILY 07/16/21   Camnitz, Ocie Doyne, MD  ?DULoxetine (CYMBALTA) 60 MG capsule Take 120 mg by mouth daily.    [provider]  ?gabapentin (NEURONTIN) 100 MG capsule SMARTSIG:2 Capsule(s) By Mouth Every Evening 02/16/22   [provider]  ?LATUDA 60 MG TABS Take 60 mg by mouth at bedtime. 07/18/21   [provider]  ?loperamide (IMODIUM) 2 MG capsule Take 2 capsules (4 mg total) by mouth as needed for diarrhea or loose stools. Do not take more than 16 mg in a day (8 tablets) 03/12/22  Haydee Salter, MD  ?metoprolol tartrate (LOPRESSOR) 50 MG tablet TAKE 1 TABLET TWICE A DAY 05/31/21   Camnitz, Ocie Doyne, MD  ?mirabegron ER (MYRBETRIQ) 50 MG TB24 tablet Take 50 mg by mouth daily.    [provider]  ?omeprazole (PRILOSEC) 40 MG capsule Take 1 capsule (40 mg total) by mouth daily. 02/25/22   Bonnita Hollow, MD  ?ondansetron (ZOFRAN) 4 MG tablet Take 1 tablet (4 mg total) by mouth every 8 (eight) hours for 14 days. 03/12/22 03/26/22  Haydee Salter, MD  ?rizatriptan (MAXALT) 10 MG tablet Take 10 mg by mouth as needed for migraine. May repeat in 2 hours if needed    [provider]  ?tolterodine (DETROL LA) 4 MG 24 hr capsule Take 4 mg by mouth daily. 02/28/22   [provider]  ?topiramate (TOPAMAX) 50 MG tablet Take 50 mg by mouth 2 (two) times daily. 08/31/20   [provider]  ? ? ?Physical Exam  ?  ?Vital Signs:  Caroleann A Morales does not have vital signs available for review today. ? ?Given telephonic nature of communication, physical exam is limited. ?AAOx3. NAD. Normal affect.  Speech and respirations  are unlabored. ? ?Accessory Clinical Findings  ?  ?None ? ?Assessment & Plan  ?  ?1.  Preoperative Cardiovascular Risk Assessment: ? ?According to the Revised Cardiac Risk Index (RCRI), her Perioperative Risk of Major Cardi

## 2022-03-26 ENCOUNTER — Encounter: Payer: Self-pay | Admitting: Family Medicine

## 2022-03-26 ENCOUNTER — Ambulatory Visit
Admission: RE | Admit: 2022-03-26 | Discharge: 2022-03-26 | Disposition: A | Discharge status: Home / Self Care | Payer: BC Managed Care – PPO | Source: Ambulatory Visit | Attending: Family Medicine | Admitting: Family Medicine

## 2022-03-26 DIAGNOSIS — G8929 Other chronic pain: Secondary | ICD-10-CM

## 2022-03-26 DIAGNOSIS — R109 Unspecified abdominal pain: Secondary | ICD-10-CM

## 2022-03-26 DIAGNOSIS — R112 Nausea with vomiting, unspecified: Secondary | ICD-10-CM

## 2022-03-26 LAB — HM COLONOSCOPY

## 2022-03-26 MED ORDER — IOPAMIDOL (ISOVUE-300) INJECTION 61%
100.0000 mL | Freq: Once | INTRAVENOUS | Status: AC | PRN
  Administered 2022-03-26: 100 mL via INTRAVENOUS

## 2022-03-27 ENCOUNTER — Encounter: Payer: Self-pay | Admitting: Family Medicine

## 2022-03-29 ENCOUNTER — Ambulatory Visit: Payer: BC Managed Care – PPO | Admitting: Family Medicine

## 2022-03-29 VITALS — BP 122/76 | HR 68 | Temp 97.2°F | Ht 68.0 in | Wt 310.2 lb

## 2022-03-29 DIAGNOSIS — K760 Fatty (change of) liver, not elsewhere classified: Secondary | ICD-10-CM | POA: Diagnosis not present

## 2022-03-29 DIAGNOSIS — K580 Irritable bowel syndrome with diarrhea: Secondary | ICD-10-CM

## 2022-03-29 DIAGNOSIS — K2970 Gastritis, unspecified, without bleeding: Secondary | ICD-10-CM

## 2022-03-29 NOTE — Progress Notes (Signed)
[facility] [address] [place] [address] Dept: [phone] Dept Fax: [phone]  Office Visit  Subjective:    Patient ID: Regina Morales, female    DOB: 03-08-1961, 61 y.o..   MRN: [id number]  Chief Complaint  Patient presents with   Follow-up    Discuss CT results.  Still having cramps/diarrhea.     History of Present Illness:  Patient is in today for follow-up from her recent colonoscopy and CT of the abdomen. She notes that she continues to have some diarrhea at times. She does have stressors in her life. She is not always getting along with her roommate, but feels she must continue the relationship, as she could not afford to live in a place on her own. She notes their lease is up in Aug, but she does not plan to move.  Past Medical History: Patient Active Problem List   Diagnosis Date Noted   Gastritis without bleeding 03/29/2022   Irritable bowel syndrome with diarrhea 03/29/2022   Postcholecystectomy diarrhea 12/07/2021   Sensorineural hearing loss (SNHL) of both ears 12/07/2021   Urinary incontinence 12/07/2021   Epigastric abdominal pain 08/20/2021   Diverticulitis 06/08/2021   Lumbar degenerative disc disease 05/28/2021   Mild spinal stenosis at L4-L5 level 05/28/2021   Severe recurrent major depression without psychotic features ([place]) 04/30/2020   OSA (obstructive sleep apnea) 07/27/2019   Stage 3 severe COPD by GOLD classification ([place]) 06/24/2019   Fibromyalgia 05/03/2019   Venous insufficiency of both lower extremities 04/29/2019   Visit for monitoring Tikosyn therapy 09/15/2018   Endometrial polyp    Normocytic anemia 04/24/2014   Migraine 04/22/2014   PAF (paroxysmal atrial fibrillation) (HCC)    GERD (gastroesophageal reflux disease)    BMI 40.0-44.9, adult [facility])    Past Surgical History:  Procedure Laterality Date   APPENDECTOMY  1990's   BREAST CYST ASPIRATION Left 2019   @ [facility]    CHOLECYSTECTOMY N/A 09/14/2021   Procedure: LAPAROSCOPIC CHOLECYSTECTOMY WITH INTRAOPERATIVE CHOLANGIOGRAM;  Surgeon: Dwan Bolt, MD;  Location: [place];  Service: General;  Laterality: N/A;   COLONOSCOPY     DILATATION & CURETTAGE/HYSTEROSCOPY WITH MYOSURE N/A 04/14/2017   Procedure: DILATATION & CURETTAGE/HYSTEROSCOPY WITH MYOSURE;  Surgeon: Janyth Pupa, DO;  Location: [place] [facility];  Service: Gynecology;  Laterality: N/A;  Polypectomy   DILATION AND CURETTAGE OF UTERUS  1983   mab   ERCP N/A 09/15/2021   Procedure: ENDOSCOPIC RETROGRADE CHOLANGIOPANCREATOGRAPHY (ERCP);  Surgeon: Clarene Essex, MD;  Location: [place];  Service: Endoscopy;  Laterality: N/A;   INNER EAR SURGERY Right 2000's   LEFT HEART CATH AND CORONARY ANGIOGRAPHY N/A 04/17/2017   Procedure: Left Heart Cath and Coronary Angiography;  Surgeon: Jettie Booze, MD;  Location: [place] [facility];  Service: Cardiovascular;  Laterality: N/A;   PLANTAR FASCIA RELEASE Right 07/2015   REMOVAL OF STONES  09/15/2021   Procedure: REMOVAL OF STONES;  Surgeon: Clarene Essex, MD;  Location: [place];  Service: Endoscopy;;   SPHINCTEROTOMY  09/15/2021   Procedure: Joan Mayans;  Surgeon: Clarene Essex, MD;  Location[facility] ENDOSCOPY;  Service: Endoscopy;;   TUBAL LIGATION  1985   ULNAR NERVE TRANSPOSITION Right    Family History  Problem Relation Age of Onset   Lung cancer Father    Alcohol abuse Father    Kidney failure Mother        s/p renal Tx   Congestive Heart Failure Mother    Atrial fibrillation  Brother    Sudden Cardiac Death Daughter        cardiac arrest   Cardiomyopathy Daughter    Delorse Limber White syndrome Grandchild         granddaughter   Outpatient Medications Prior to Visit  Medication Sig Dispense Refill   acetaminophen (TYLENOL) 500 MG tablet Take 2 tablets (1,000 mg total) by mouth 3 (three) times daily as needed for mild pain. 30 tablet 0   albuterol (VENTOLIN HFA) 108 (90 Base) MCG/ACT inhaler  Inhale 2 puffs into the lungs every 6 (six) hours as needed for wheezing or shortness of breath.     apixaban (ELIQUIS) 5 MG TABS tablet Take 1 tablet (5 mg total) by mouth 2 (two) times daily. 180 tablet 1   divalproex (DEPAKOTE ER) 250 MG 24 hr tablet Take 250 mg by mouth See admin instructions. Take with 500 mg for a total on 750 mg in the evening     divalproex (DEPAKOTE ER) 500 MG 24 hr tablet Take 500 mg by mouth See admin instructions. Take with 250 mg for a total of 750 mg in the evening     dofetilide (TIKOSYN) 500 MCG capsule TAKE 1 CAPSULE(500 MCG) BY MOUTH TWICE DAILY 180 capsule 2   DULoxetine (CYMBALTA) 60 MG capsule Take 120 mg by mouth daily.     gabapentin (NEURONTIN) 100 MG capsule SMARTSIG:2 Capsule(s) By Mouth Every Evening     LATUDA 60 MG TABS Take 60 mg by mouth at bedtime.     metoprolol tartrate (LOPRESSOR) 50 MG tablet TAKE 1 TABLET TWICE A DAY 180 tablet 3   mirabegron ER (MYRBETRIQ) 50 MG TB24 tablet Take 50 mg by mouth daily.     rizatriptan (MAXALT) 10 MG tablet Take 10 mg by mouth as needed for migraine. May repeat in 2 hours if needed     topiramate (TOPAMAX) 50 MG tablet Take 50 mg by mouth 2 (two) times daily.     loperamide (IMODIUM) 2 MG capsule Take 2 capsules (4 mg total) by mouth as needed for diarrhea or loose stools. Do not take more than 16 mg in a day (8 tablets) 30 capsule 0   omeprazole (PRILOSEC) 40 MG capsule Take 1 capsule (40 mg total) by mouth daily. 30 capsule 3   cholestyramine (QUESTRAN) 4 g packet Take by mouth. (Patient not taking: Reported on 03/14/2022)     dicyclomine (BENTYL) 10 MG capsule Take 1 capsule (10 mg total) by mouth 4 (four) times daily -  before meals and at bedtime for 14 days. 56 capsule 0   tolterodine (DETROL LA) 4 MG 24 hr capsule Take 4 mg by mouth daily.     No facility-administered medications prior to visit.   Allergies  Allergen Reactions   Codeine Nausea And Vomiting, Other (See Comments) and Nausea Only      Objective:   Today's Vitals   03/29/22 1328  BP: 122/76  Pulse: 68  Temp: (!) 97.2 F (36.2 C)  TempSrc: Temporal  SpO2: 97%  Weight: (!) 310 lb 3.2 oz (140.7 kg)  Height: 5\' 8"  (1.727 m)   Body mass index is 47.17 kg/m.   General: Well developed, well nourished. No acute distress. Psych: Alert and oriented. Normal mood and affect.  Health Maintenance Due  Topic Date Due   Hepatitis C Screening  Never done   COVID-19 Vaccine (3 - Moderna risk series) 07/31/2020   Zoster Vaccines- Shingrix (2 of 2) 06/22/2021   Imaging: CT  of Abdomen and Pelvis w contrast (03/26/2022) IMPRESSION: 1. No acute process identified. 2. Hepatomegaly with evidence of hepatic steatosis. 3. Tiny hiatal hernia. 4. Stable subcentimeter hypodensities in the kidneys which likely represent cysts.    Assessment & Plan:   1. Irritable bowel syndrome with diarrhea [name] has had lab and stool studies, a CT scan of the abdomen, and upper and lower endoscopy.  None of these have revealed an etiology of her underlying issues. I suspect that this represents IBS-D. She can continue to use loperamide when needed. I recommended she start to take a daily probiotic. I also talked with her and provided information on a low FODMAP diet. I will check in with her in 2 months.  2. Gastritis without bleeding, unspecified chronicity, unspecified gastritis type The EGD showed some gastritis. Biopsies pending. She should continue on omeprazole 40 mg daily.  Return in about 2 months (around 05/29/2022) for Reassessment.   Haydee Salter, MD

## 2022-03-29 NOTE — Patient Instructions (Signed)
Low-FODMAP Eating Plan  FODMAP stands for fermentable oligosaccharides, disaccharides, monosaccharides, and polyols. These are sugars that are hard for some people to digest. A low-FODMAP eating plan may help some people who have irritable bowel syndrome (IBS) and certain other bowel (intestinal) diseases to manage their symptoms. This meal plan can be complicated to follow. Work with a diet and nutrition specialist (dietitian) to make a low-FODMAP eating plan that is right for you. A dietitian can help make sure that you get enough nutrition from this diet. What are tips for following this plan? Reading food labels Check labels for hidden FODMAPs such as: High-fructose syrup. Honey. Agave. Natural fruit flavors. Onion or garlic powder. Choose low-FODMAP foods that contain 3-4 grams of fiber per serving. Check food labels for serving sizes. Eat only one serving at a time to make sure FODMAP levels stay low. Shopping Shop with a list of foods that are recommended on this diet and make a meal plan. Meal planning Follow a low-FODMAP eating plan for up to 6 weeks, or as told by your health care provider or dietitian. To follow the eating plan: Eliminate high-FODMAP foods from your diet completely. Choose only low-FODMAP foods to eat. You will do this for 2-6 weeks. Gradually reintroduce high-FODMAP foods into your diet one at a time. Most people should wait a few days before introducing the next new high-FODMAP food into their meal plan. Your dietitian can recommend how quickly you may reintroduce foods. Keep a daily record of what and how much you eat and drink. Make note of any symptoms that you have after eating. Review your daily record with a dietitian regularly to identify which foods you can eat and which foods you should avoid. General tips Drink enough fluid each day to keep your urine pale yellow. Avoid processed foods. These often have added sugar and may be high in FODMAPs. Avoid  most dairy products, whole grains, and sweeteners. Work with a dietitian to make sure you get enough fiber in your diet. Avoid high FODMAP foods at meals to manage symptoms. Recommended foods Fruits Bananas, oranges, tangerines, lemons, limes, blueberries, raspberries, strawberries, grapes, cantaloupe, honeydew melon, kiwi, papaya, passion fruit, and pineapple. Limited amounts of dried cranberries, banana chips, and shredded coconut. Vegetables Eggplant, zucchini, cucumber, peppers, green beans, bean sprouts, lettuce, arugula, kale, Swiss chard, spinach, collard greens, bok choy, summer squash, potato, and tomato. Limited amounts of corn, carrot, and sweet potato. Green parts of scallions. Grains Gluten-free grains, such as rice, oats, buckwheat, quinoa, corn, polenta, and millet. Gluten-free pasta, bread, or cereal. Rice noodles. Corn tortillas. Meats and other proteins Unseasoned beef, pork, poultry, or fish. Eggs. Bacon. Tofu (firm) and tempeh. Limited amounts of nuts and seeds, such as almonds, walnuts, brazil nuts, pecans, peanuts, nut butters, pumpkin seeds, chia seeds, and sunflower seeds. Dairy Lactose-free milk, yogurt, and kefir. Lactose-free cottage cheese and ice cream. Non-dairy milks, such as almond, coconut, hemp, and rice milk. Non-dairy yogurt. Limited amounts of goat cheese, brie, mozzarella, parmesan, swiss, and other hard cheeses. Fats and oils Butter-free spreads. Vegetable oils, such as olive, canola, and sunflower oil. Seasoning and other foods Artificial sweeteners with names that do not end in "ol," such as aspartame, saccharine, and stevia. Maple syrup, white table sugar, raw sugar, brown sugar, and molasses. Mayonnaise, soy sauce, and tamari. Fresh basil, coriander, parsley, rosemary, and thyme. Beverages Water and mineral water. Sugar-sweetened soft drinks. Small amounts of orange juice or cranberry juice. Black and green tea. Most dry wines.   Coffee. The items listed  above may not be a complete list of foods and beverages you can eat. Contact a dietitian for more information. Foods to avoid Fruits Fresh, dried, and juiced forms of apple, pear, watermelon, peach, plum, cherries, apricots, blackberries, boysenberries, figs, nectarines, and mango. Avocado. Vegetables Chicory root, artichoke, asparagus, cabbage, snow peas, Brussels sprouts, broccoli, sugar snap peas, mushrooms, celery, and cauliflower. Onions, garlic, leeks, and the white part of scallions. Grains Wheat, including kamut, durum, and semolina. Barley and bulgur. Couscous. Wheat-based cereals. Wheat noodles, bread, crackers, and pastries. Meats and other proteins Fried or fatty meat. Sausage. Cashews and pistachios. Soybeans, baked beans, black beans, chickpeas, kidney beans, fava beans, navy beans, lentils, black-eyed peas, and split peas. Dairy Milk, yogurt, ice cream, and soft cheese. Cream and sour cream. Milk-based sauces. Custard. Buttermilk. Soy milk. Seasoning and other foods Any sugar-free gum or candy. Foods that contain artificial sweeteners such as sorbitol, mannitol, isomalt, or xylitol. Foods that contain honey, high-fructose corn syrup, or agave. Bouillon, vegetable stock, beef stock, and chicken stock. Garlic and onion powder. Condiments made with onion, such as hummus, chutney, pickles, relish, salad dressing, and salsa. Tomato paste. Beverages Chicory-based drinks. Coffee substitutes. Chamomile tea. Fennel tea. Sweet or fortified wines such as port or sherry. Diet soft drinks made with isomalt, mannitol, maltitol, sorbitol, or xylitol. Apple, pear, and mango juice. Juices with high-fructose corn syrup. The items listed above may not be a complete list of foods and beverages you should avoid. Contact a dietitian for more information. Summary FODMAP stands for fermentable oligosaccharides, disaccharides, monosaccharides, and polyols. These are sugars that are hard for some people to  digest. A low-FODMAP eating plan is a short-term diet that helps to ease symptoms of certain bowel diseases. The eating plan usually lasts up to 6 weeks. After that, high-FODMAP foods are reintroduced gradually and one at a time. This can help you find out which foods may be causing symptoms. A low-FODMAP eating plan can be complicated. It is best to work with a dietitian who has experience with this type of plan. This information is not intended to replace advice given to you by your health care provider. Make sure you discuss any questions you have with your health care provider. Document Revised: 03/09/2020 Document Reviewed: 03/09/2020 Elsevier Patient Education  2023 Elsevier Inc.  

## 2022-04-02 ENCOUNTER — Telehealth: Payer: Self-pay | Admitting: Family Medicine

## 2022-04-02 DIAGNOSIS — R112 Nausea with vomiting, unspecified: Secondary | ICD-10-CM

## 2022-04-02 NOTE — Telephone Encounter (Signed)
Pt is wanting a call back concerning her most recent colonoscopy results. Please advise pt at [phone]

## 2022-04-02 NOTE — Telephone Encounter (Signed)
Left VM to RTN call.  Dm/cma

## 2022-04-03 ENCOUNTER — Telehealth: Payer: Self-pay

## 2022-04-03 MED ORDER — ONDANSETRON HCL 4 MG PO TABS: 4.0000 mg | ORAL_TABLET | Freq: Three times a day (TID) | ORAL | 0 refills | Status: DC | PRN

## 2022-04-03 NOTE — Telephone Encounter (Signed)
**Note De-Identified  Obfuscation** Dofetilide PA started through covermymeds. Key: [id number]

## 2022-04-03 NOTE — Telephone Encounter (Signed)
Patient is wanting her FMLA extended till she gets back to see Dr Elnoria Howard for a f/o on 04/15/22, she is supposed to go back to work on this Saturday.  Also would like to have a refill on her Zofran sent tot he pharmacy due to still having nausea/diarrhea.   She will bring the paper work by and drop them off.  Please review and advise.  Thanks. Dm/cma

## 2022-04-03 NOTE — Addendum Note (Signed)
Addended by: Loyola Mast on: 04/03/2022 12:59 PM   Modules accepted: Orders

## 2022-04-05 NOTE — Telephone Encounter (Signed)
**Note De-Identified  Obfuscation** Regina Morales Key: [id number] Outcome: Cancelled on May 31 no pa required Drug Dofetilide capsules Form [employer] account executive Form (CB)  [facility] is aware of this approval.

## 2022-04-08 ENCOUNTER — Telehealth: Payer: Self-pay | Admitting: Family Medicine

## 2022-04-08 NOTE — Telephone Encounter (Signed)
Pt called and wanted to know if you received extention paperwork from [facility]

## 2022-04-09 ENCOUNTER — Telehealth: Payer: Self-pay | Admitting: Family Medicine

## 2022-04-09 NOTE — Telephone Encounter (Signed)
Lft VM to rtn call. Dm/cma  

## 2022-04-09 NOTE — Telephone Encounter (Signed)
Pt returned your call today @ 12.22 please give pt a call back

## 2022-04-09 NOTE — Telephone Encounter (Signed)
Spoke to patient regarding the form and advised her to call GI ofice due to the new medication Colestipol is not working for her and she has been taking it since 04/03/22.  She will reach out to them and see what they recommend. Dm/cma 

## 2022-04-09 NOTE — Telephone Encounter (Signed)
Received forms, Dr Veto Kemps is out till 04/15/22,   will place on desk to be filled out upon returning 04/15/22. Dm/cma

## 2022-04-09 NOTE — Telephone Encounter (Signed)
Spoke to patient regarding the form and advised her to call GI ofice due to the new medication Colestipol is not working for her and she has been taking it since 04/03/22.  She will reach out to them and see what they recommend. Dm/cma

## 2022-04-12 ENCOUNTER — Telehealth: Payer: Self-pay | Admitting: Family Medicine

## 2022-04-12 NOTE — Telephone Encounter (Signed)
Pt called about her Paperwork for disability. Her GI Dr wants her out of work for 2 weeks. Her return date to work 6/23. She would like this added to her paperwork.

## 2022-04-16 NOTE — Telephone Encounter (Signed)
Pateint notified VIA phone that paper work was faxed to [phone]. Dm/cma

## 2022-05-30 ENCOUNTER — Encounter: Payer: Self-pay | Admitting: Family Medicine

## 2022-05-30 ENCOUNTER — Ambulatory Visit (INDEPENDENT_AMBULATORY_CARE_PROVIDER_SITE_OTHER): Payer: BC Managed Care – PPO | Admitting: Family Medicine

## 2022-05-30 VITALS — BP 120/76 | HR 78 | Temp 97.8°F | Ht 68.0 in | Wt 309.0 lb

## 2022-05-30 DIAGNOSIS — K580 Irritable bowel syndrome with diarrhea: Secondary | ICD-10-CM | POA: Diagnosis not present

## 2022-05-30 DIAGNOSIS — Z23 Encounter for immunization: Secondary | ICD-10-CM | POA: Diagnosis not present

## 2022-05-30 DIAGNOSIS — S76112A Strain of left quadriceps muscle, fascia and tendon, initial encounter: Secondary | ICD-10-CM | POA: Diagnosis not present

## 2022-05-30 DIAGNOSIS — H903 Sensorineural hearing loss, bilateral: Secondary | ICD-10-CM

## 2022-05-30 MED ORDER — NAPROXEN 500 MG PO TABS: 500.0000 mg | ORAL_TABLET | Freq: Two times a day (BID) | ORAL | 0 refills | Status: DC

## 2022-05-30 NOTE — Progress Notes (Addendum)
[facility] [address] [place] [address] Dept: [phone] Dept Fax: [phone]  Chronic Care Office Visit  Subjective:    Patient ID: Regina Morales, female    DOB: 1961/10/18, 61 y.o..   MRN: [id number]  Chief Complaint  Patient presents with   Follow-up    2 month f/u. C/o having pain in upper LT leg x 2 weeks    History of Present Illness:  Patient is in today for reassessment of chronic medical issues.  [name] had been experiencing abdominal pain and diarrhea since April. Her evaluation has suggested this is likely due to IBS-D. She is being followed by Dr. Elnoria Morales (GI). He has started her on alosetron (Lotronex) 0.5 mg bid. She has found that this has helped considerably. She is no longer feeling the sudden urges to defecate. I had advised her about using a probiotic and following a low FODMAP diet. She has not implemented this yet. She remains off work, but feels like she will be up to returning to work sooner than Dr. Elnoria Morales had predicted.  [name] had a audiology exam earlier this year. She was noted to have mild to moderate sensorineural hearing loss. The audiologist had advised her to see an ENT physician. She notes she used hearing aides some years ago, but has not had them for some time now.  [name] notes a two week history of pain int he anterior left leg. She finds this doesn't bother her walking, but does hurt with certain movements, like getting in and out of bed. She was not sure if she might have pulled a muscle.  Past Medical History: Patient Active Problem List   Diagnosis Date Noted   Gastritis without bleeding 03/29/2022   Irritable bowel syndrome with diarrhea 03/29/2022   NAFLD (nonalcoholic fatty liver disease) 64/40/3474   Postcholecystectomy diarrhea 12/07/2021   Sensorineural hearing loss (SNHL) of both ears 12/07/2021   Urinary incontinence 12/07/2021   Epigastric abdominal pain  08/20/2021   Diverticulitis 06/08/2021   Lumbar degenerative disc disease 05/28/2021   Mild spinal stenosis at L4-L5 level 05/28/2021   Severe recurrent major depression without psychotic features (HCC) 04/30/2020   OSA (obstructive sleep apnea) 07/27/2019   Stage 3 severe COPD by GOLD classification (HCC) 06/24/2019   Fibromyalgia 05/03/2019   Venous insufficiency of both lower extremities 04/29/2019   Visit for monitoring Tikosyn therapy 09/15/2018   Endometrial polyp    Normocytic anemia 04/24/2014   Migraine 04/22/2014   PAF (paroxysmal atrial fibrillation) (HCC)    GERD (gastroesophageal reflux disease)    BMI 40.0-44.9, adult [facility])    Past Surgical History:  Procedure Laterality Date   APPENDECTOMY  1990's   BREAST CYST ASPIRATION Left 2019   @ [facility]   CHOLECYSTECTOMY N/A 09/14/2021   Procedure: LAPAROSCOPIC CHOLECYSTECTOMY WITH INTRAOPERATIVE CHOLANGIOGRAM;  Surgeon: Fritzi Mandes, MD;  Location: [facility] OR;  Service: General;  Laterality: N/A;   COLONOSCOPY     DILATATION & CURETTAGE/HYSTEROSCOPY WITH MYOSURE N/A 04/14/2017   Procedure: DILATATION & CURETTAGE/HYSTEROSCOPY WITH MYOSURE;  Surgeon: Myna Hidalgo, DO;  Location: [facility] ORS;  Service: Gynecology;  Laterality: N/A;  Polypectomy   DILATION AND CURETTAGE OF UTERUS  1983   [facility]   ERCP N/A 09/15/2021   Procedure: ENDOSCOPIC RETROGRADE CHOLANGIOPANCREATOGRAPHY (ERCP);  Surgeon: Vida Rigger, MD;  Location: [facility] ENDOSCOPY;  Service: Endoscopy;  Laterality: N/A;   INNER EAR SURGERY Right 2000's   LEFT HEART CATH AND CORONARY ANGIOGRAPHY N/A  04/17/2017   Procedure: Left Heart Cath and Coronary Angiography;  Surgeon: Jettie Booze, MD;  Location: [place] CV LAB;  Service: Cardiovascular;  Laterality: N/A;   PLANTAR FASCIA RELEASE Right 07/2015   REMOVAL OF STONES  09/15/2021   Procedure: REMOVAL OF STONES;  Surgeon: Clarene Essex, MD;  Location: [facility] ENDOSCOPY;  Service: Endoscopy;;   SPHINCTEROTOMY  09/15/2021    Procedure: Joan Mayans;  Surgeon: Clarene Essex, MD;  Location: [facility] ENDOSCOPY;  Service: Endoscopy;;   TUBAL LIGATION  1985   ULNAR NERVE TRANSPOSITION Right    Family History  Problem Relation Age of Onset   Lung cancer Father    Alcohol abuse Father    Kidney failure Mother        s/p renal Tx   Congestive Heart Failure Mother    Atrial fibrillation Brother    Sudden Cardiac Death Daughter        cardiac arrest   Cardiomyopathy Daughter    Delorse Limber White syndrome Grandchild         granddaughter   Outpatient Medications Prior to Visit  Medication Sig Dispense Refill   acetaminophen (TYLENOL) 500 MG tablet Take 2 tablets (1,000 mg total) by mouth 3 (three) times daily as needed for mild pain. 30 tablet 0   albuterol (VENTOLIN HFA) 108 (90 Base) MCG/ACT inhaler Inhale 2 puffs into the lungs every 6 (six) hours as needed for wheezing or shortness of breath.     alosetron (LOTRONEX) 0.5 MG tablet Take 0.5 mg by mouth 2 (two) times daily.     apixaban (ELIQUIS) 5 MG TABS tablet Take 1 tablet (5 mg total) by mouth 2 (two) times daily. 180 tablet 1   divalproex (DEPAKOTE ER) 250 MG 24 hr tablet Take 250 mg by mouth See admin instructions. Take with 500 mg for a total on 750 mg in the evening     dofetilide (TIKOSYN) 500 MCG capsule TAKE 1 CAPSULE(500 MCG) BY MOUTH TWICE DAILY 180 capsule 2   DULoxetine (CYMBALTA) 60 MG capsule Take 120 mg by mouth daily.     gabapentin (NEURONTIN) 100 MG capsule SMARTSIG:2 Capsule(s) By Mouth Every Evening     LATUDA 60 MG TABS Take 120 mg by mouth at bedtime.     metoprolol tartrate (LOPRESSOR) 50 MG tablet TAKE 1 TABLET TWICE A DAY 180 tablet 3   mirabegron ER (MYRBETRIQ) 50 MG TB24 tablet Take 50 mg by mouth daily.     omeprazole (PRILOSEC) 40 MG capsule Take 1 capsule (40 mg total) by mouth daily. 30 capsule 3   ondansetron (ZOFRAN) 4 MG tablet Take 1 tablet (4 mg total) by mouth every 8 (eight) hours as needed for nausea or vomiting. 20 tablet  0   rizatriptan (MAXALT) 10 MG tablet Take 10 mg by mouth as needed for migraine. May repeat in 2 hours if needed     topiramate (TOPAMAX) 50 MG tablet Take 50 mg by mouth 2 (two) times daily.     divalproex (DEPAKOTE ER) 500 MG 24 hr tablet Take 500 mg by mouth See admin instructions. Take with 250 mg for a total of 750 mg in the evening (Patient not taking: Reported on 05/30/2022)     loperamide (IMODIUM) 2 MG capsule Take 2 capsules (4 mg total) by mouth as needed for diarrhea or loose stools. Do not take more than 16 mg in a day (8 tablets) 30 capsule 0   No facility-administered medications prior to visit.   Allergies  Allergen Reactions   Codeine Nausea And Vomiting, Other (See Comments) and Nausea Only    Objective:   Today's Vitals   05/30/22 1018  BP: 120/76  Pulse: 78  Temp: 97.8 F (36.6 C)  TempSrc: Temporal  SpO2: 97%  Weight: (!) 309 lb (140.2 kg)  Height: 5\' 8"  (1.727 m)   Body mass index is 46.98 kg/m.   General: Well developed, well nourished. No acute distress. Extremities: Pain over the medial aspect of the left anterior thigh, closer to the groin. The pain is increased with   resisted knee extension with the foot everted. Psych: Alert and oriented. Normal mood and affect.  Health Maintenance Due  Topic Date Due   Hepatitis C Screening  Never done   Zoster Vaccines- Shingrix (2 of 2) 06/22/2021     Assessment & Plan:   1. Irritable bowel syndrome with diarrhea Improving with use of alosetron. She will continue to follow with Dr. 06/24/2021. I reinforced her starting on a probiotic and following a low FODMAP diet. Her roommate has offered to help her with some of this.  2. Sensorineural hearing loss (SNHL) of both ears Reviewed audiology report. I will refer her to ENT to assess.  - Ambulatory referral to ENT  3. Quadriceps strain, left, initial encounter Exam is consistent with a strain of the vastus medialis of the quadriceps. Recommend she use heat and  take a 7-day course of an NSAID. If not improved, will consider PT referral.  - naproxen (NAPROSYN) 500 MG tablet; Take 1 tablet (500 mg total) by mouth 2 (two) times daily with a meal.  Dispense: 30 tablet; Refill: 0  4. Need for shingles vaccine  - Varicella-zoster vaccine IM  Return in about 3 months (around 08/30/2022) for Reassessment.   09/01/2022, MD

## 2022-05-30 NOTE — Patient Instructions (Signed)
Low-FODMAP Eating Plan  FODMAP stands for fermentable oligosaccharides, disaccharides, monosaccharides, and polyols. These are sugars that are hard for some people to digest. A low-FODMAP eating plan may help some people who have irritable bowel syndrome (IBS) and certain other bowel (intestinal) diseases to manage their symptoms. This meal plan can be complicated to follow. Work with a diet and nutrition specialist (dietitian) to make a low-FODMAP eating plan that is right for you. A dietitian can help make sure that you get enough nutrition from this diet. What are tips for following this plan? Reading food labels Check labels for hidden FODMAPs such as: High-fructose syrup. Honey. Agave. Natural fruit flavors. Onion or garlic powder. Choose low-FODMAP foods that contain 3-4 grams of fiber per serving. Check food labels for serving sizes. Eat only one serving at a time to make sure FODMAP levels stay low. Shopping Shop with a list of foods that are recommended on this diet and make a meal plan. Meal planning Follow a low-FODMAP eating plan for up to 6 weeks, or as told by your health care provider or dietitian. To follow the eating plan: Eliminate high-FODMAP foods from your diet completely. Choose only low-FODMAP foods to eat. You will do this for 2-6 weeks. Gradually reintroduce high-FODMAP foods into your diet one at a time. Most people should wait a few days before introducing the next new high-FODMAP food into their meal plan. Your dietitian can recommend how quickly you may reintroduce foods. Keep a daily record of what and how much you eat and drink. Make note of any symptoms that you have after eating. Review your daily record with a dietitian regularly to identify which foods you can eat and which foods you should avoid. General tips Drink enough fluid each day to keep your urine pale yellow. Avoid processed foods. These often have added sugar and may be high in FODMAPs. Avoid  most dairy products, whole grains, and sweeteners. Work with a dietitian to make sure you get enough fiber in your diet. Avoid high FODMAP foods at meals to manage symptoms. Recommended foods Fruits Bananas, oranges, tangerines, lemons, limes, blueberries, raspberries, strawberries, grapes, cantaloupe, honeydew melon, kiwi, papaya, passion fruit, and pineapple. Limited amounts of dried cranberries, banana chips, and shredded coconut. Vegetables Eggplant, zucchini, cucumber, peppers, green beans, bean sprouts, lettuce, arugula, kale, Swiss chard, spinach, collard greens, bok choy, summer squash, potato, and tomato. Limited amounts of corn, carrot, and sweet potato. Green parts of scallions. Grains Gluten-free grains, such as rice, oats, buckwheat, quinoa, corn, polenta, and millet. Gluten-free pasta, bread, or cereal. Rice noodles. Corn tortillas. Meats and other proteins Unseasoned beef, pork, poultry, or fish. Eggs. Bacon. Tofu (firm) and tempeh. Limited amounts of nuts and seeds, such as almonds, walnuts, brazil nuts, pecans, peanuts, nut butters, pumpkin seeds, chia seeds, and sunflower seeds. Dairy Lactose-free milk, yogurt, and kefir. Lactose-free cottage cheese and ice cream. Non-dairy milks, such as almond, coconut, hemp, and rice milk. Non-dairy yogurt. Limited amounts of goat cheese, brie, mozzarella, parmesan, swiss, and other hard cheeses. Fats and oils Butter-free spreads. Vegetable oils, such as olive, canola, and sunflower oil. Seasoning and other foods Artificial sweeteners with names that do not end in "ol," such as aspartame, saccharine, and stevia. Maple syrup, white table sugar, raw sugar, brown sugar, and molasses. Mayonnaise, soy sauce, and tamari. Fresh basil, coriander, parsley, rosemary, and thyme. Beverages Water and mineral water. Sugar-sweetened soft drinks. Small amounts of orange juice or cranberry juice. Black and green tea. Most dry wines.   Coffee. The items listed  above may not be a complete list of foods and beverages you can eat. Contact a dietitian for more information. Foods to avoid Fruits Fresh, dried, and juiced forms of apple, pear, watermelon, peach, plum, cherries, apricots, blackberries, boysenberries, figs, nectarines, and mango. Avocado. Vegetables Chicory root, artichoke, asparagus, cabbage, snow peas, Brussels sprouts, broccoli, sugar snap peas, mushrooms, celery, and cauliflower. Onions, garlic, leeks, and the white part of scallions. Grains Wheat, including kamut, durum, and semolina. Barley and bulgur. Couscous. Wheat-based cereals. Wheat noodles, bread, crackers, and pastries. Meats and other proteins Fried or fatty meat. Sausage. Cashews and pistachios. Soybeans, baked beans, black beans, chickpeas, kidney beans, fava beans, navy beans, lentils, black-eyed peas, and split peas. Dairy Milk, yogurt, ice cream, and soft cheese. Cream and sour cream. Milk-based sauces. Custard. Buttermilk. Soy milk. Seasoning and other foods Any sugar-free gum or candy. Foods that contain artificial sweeteners such as sorbitol, mannitol, isomalt, or xylitol. Foods that contain honey, high-fructose corn syrup, or agave. Bouillon, vegetable stock, beef stock, and chicken stock. Garlic and onion powder. Condiments made with onion, such as hummus, chutney, pickles, relish, salad dressing, and salsa. Tomato paste. Beverages Chicory-based drinks. Coffee substitutes. Chamomile tea. Fennel tea. Sweet or fortified wines such as port or sherry. Diet soft drinks made with isomalt, mannitol, maltitol, sorbitol, or xylitol. Apple, pear, and mango juice. Juices with high-fructose corn syrup. The items listed above may not be a complete list of foods and beverages you should avoid. Contact a dietitian for more information. Summary FODMAP stands for fermentable oligosaccharides, disaccharides, monosaccharides, and polyols. These are sugars that are hard for some people to  digest. A low-FODMAP eating plan is a short-term diet that helps to ease symptoms of certain bowel diseases. The eating plan usually lasts up to 6 weeks. After that, high-FODMAP foods are reintroduced gradually and one at a time. This can help you find out which foods may be causing symptoms. A low-FODMAP eating plan can be complicated. It is best to work with a dietitian who has experience with this type of plan. This information is not intended to replace advice given to you by your health care provider. Make sure you discuss any questions you have with your health care provider. Document Revised: 03/09/2020 Document Reviewed: 03/09/2020 Elsevier Patient Education  2023 Elsevier Inc.  

## 2022-06-03 NOTE — Progress Notes (Signed)
Cardiology Office Note Date:  06/03/2022  Patient ID:  Regina Morales, DOB [birth year], MRN [id number] PCP:  Loyola Mast, MD  Cardiologist:  Dr. Eden Emms Electrophysiologist: Dr. Elberta Fortis    Chief Complaint:   6 mo  History of Present Illness: Regina Morales is a 61 y.o. female with history of HTN, OSA w/CPAP, Afib, DVT/PE (w/pregnancy), GERD, fibromyalgia.  She comes in today to be seen for Dr. Elberta Fortis, last seen by her 03/15/21, arhythmia burden down, sadly still grieving loss of her daughter, working with her MD, the cymbalta helping her depression though causing a tremor. QTc was stable, discussed importance when discussing her medicines/management with her PMD/psychiatrist that they are aware of her Tikosyn.  She had elective lap-chole 09/14/21 discharged 09/17/21 to resume her Eliquis 3 days post-op (11/15).  I saw her 09/19/21 She comes today accompanied by her roommate. Cardiac-wise she reports feeling OK No CP, no palpitations She is s/p lap-chole/ERCP and still sore, able to eat some yesterday better Roommate has been urging to her to mobilize The patient states she was called that Dr. Elberta Fortis wanted her to come in, was unclear as to why. She resumed Eliquis yesterday as instructed She reports compliance with medicines She has post op surgery follow up in place Note a tremor, in d/w pt/roommate, following with psychiatry and felt 2/2 medicines, the patient states that psychiatry mentioned a side effect of her medication though perhaps a reasonable comprimise for + benefits of her medicines. Urged ongoing discussions, roommate a bit frustrated says they only ever get video visits, they will continue discussions with her PMD and psychiatry provider We discussed her meds, especially Zofran and re-educated about Tikosyn and meds  Pre-op team addressed pre-op eval for colonoscopy in May.  TODAY She is doing pretty well. Post lap-chole she struggled with GI issues and  diarrhea, but the lat medication alosetron has done well. Her depakote dose has been reduced and her tremor improved with plans to taper her off all together. She notes by her watch and palpitations AFib with rates towards 130's, these are fairly infrequent and last a minute or so only. Outside of her watch alert and palpitations do not cause any other symptoms. No SOB No CP No near syncope or syncope. No bleeding or signs of bleeding  Afib hx Diagnosed 2014 Flecainide failed with recurrent arrhythmia Tikosyn started Nov 2019  Past Medical History:  Diagnosis Date   Anemia    hx years ago   Anxiety    years ago   Buzzing in ear    "right; even after OR"   Chest pain    a. 2009: neg MV  ([facility])   COPD (chronic obstructive pulmonary disease) (HCC) 2018   Depression    DVT (deep vein thrombosis) in pregnancy 1980's   LLE   Fibromyalgia    "dx'd many many years ago; I haven't had any problems w/it" (04/15/2017)   GERD (gastroesophageal reflux disease)    Hepatic steatosis    High cholesterol    History of blood transfusion    "low count after appendix surgery" unsure # of units transfused   Hypertension    Migraines    "maybe once/2 months" (04/15/2017)   Obesity    OSA on CPAP    Persistent atrial fibrillation (HCC)    a. Dx 07/2013;  b. CHA2DS2VASc=1 (female);  c. 07/2013 Echo:  EF 50-55%, no rwma, mod LVH.   Pulmonary embolism (HCC) ~ 1984   "after I  had my last child"   SVD (spontaneous vaginal delivery)    x 4    Past Surgical History:  Procedure Laterality Date   APPENDECTOMY  1990's   BREAST CYST ASPIRATION Left 2019   @ [facility]   CHOLECYSTECTOMY N/A 09/14/2021   Procedure: LAPAROSCOPIC CHOLECYSTECTOMY WITH INTRAOPERATIVE CHOLANGIOGRAM;  Surgeon: Fritzi Mandes, MD;  Location[facility] OR;  Service: General;  Laterality: N/A;   COLONOSCOPY     DILATATION & CURETTAGE/HYSTEROSCOPY WITH MYOSURE N/A 04/14/2017   Procedure: DILATATION & CURETTAGE/HYSTEROSCOPY WITH  MYOSURE;  Surgeon: Myna Hidalgo, DO;  Location: [facility] ORS;  Service: Gynecology;  Laterality: N/A;  Polypectomy   DILATION AND CURETTAGE OF UTERUS  1983   [facility]   ERCP N/A 09/15/2021   Procedure: ENDOSCOPIC RETROGRADE CHOLANGIOPANCREATOGRAPHY (ERCP);  Surgeon: Vida Rigger, MD;  Location: [facility] ENDOSCOPY;  Service: Endoscopy;  Laterality: N/A;   INNER EAR SURGERY Right 2000's   LEFT HEART CATH AND CORONARY ANGIOGRAPHY N/A 04/17/2017   Procedure: Left Heart Cath and Coronary Angiography;  Surgeon: Corky Crafts, MD;  Location: [facility] INVASIVE CV LAB;  Service: Cardiovascular;  Laterality: N/A;   PLANTAR FASCIA RELEASE Right 07/2015   REMOVAL OF STONES  09/15/2021   Procedure: REMOVAL OF STONES;  Surgeon: Vida Rigger, MD;  Location: [facility] ENDOSCOPY;  Service: Endoscopy;;   SPHINCTEROTOMY  09/15/2021   Procedure: Dennison Mascot;  Surgeon: Vida Rigger, MD;  Location: [facility] ENDOSCOPY;  Service: Endoscopy;;   TUBAL LIGATION  1985   ULNAR NERVE TRANSPOSITION Right     Current Outpatient Medications  Medication Sig Dispense Refill   acetaminophen (TYLENOL) 500 MG tablet Take 2 tablets (1,000 mg total) by mouth 3 (three) times daily as needed for mild pain. 30 tablet 0   albuterol (VENTOLIN HFA) 108 (90 Base) MCG/ACT inhaler Inhale 2 puffs into the lungs every 6 (six) hours as needed for wheezing or shortness of breath.     alosetron (LOTRONEX) 0.5 MG tablet Take 0.5 mg by mouth 2 (two) times daily.     apixaban (ELIQUIS) 5 MG TABS tablet Take 1 tablet (5 mg total) by mouth 2 (two) times daily. 180 tablet 1   divalproex (DEPAKOTE ER) 250 MG 24 hr tablet Take 250 mg by mouth See admin instructions. Take with 500 mg for a total on 750 mg in the evening     divalproex (DEPAKOTE ER) 500 MG 24 hr tablet Take 500 mg by mouth See admin instructions. Take with 250 mg for a total of 750 mg in the evening (Patient not taking: Reported on 05/30/2022)     dofetilide (TIKOSYN) 500 MCG capsule TAKE 1 CAPSULE(500 MCG) BY  MOUTH TWICE DAILY 180 capsule 2   DULoxetine (CYMBALTA) 60 MG capsule Take 120 mg by mouth daily.     gabapentin (NEURONTIN) 100 MG capsule SMARTSIG:2 Capsule(s) By Mouth Every Evening     LATUDA 60 MG TABS Take 120 mg by mouth at bedtime.     metoprolol tartrate (LOPRESSOR) 50 MG tablet TAKE 1 TABLET TWICE A DAY 180 tablet 3   mirabegron ER (MYRBETRIQ) 50 MG TB24 tablet Take 50 mg by mouth daily.     naproxen (NAPROSYN) 500 MG tablet Take 1 tablet (500 mg total) by mouth 2 (two) times daily with a meal. 30 tablet 0   omeprazole (PRILOSEC) 40 MG capsule Take 1 capsule (40 mg total) by mouth daily. 30 capsule 3   ondansetron (ZOFRAN) 4 MG tablet Take 1 tablet (4 mg total) by mouth every 8 (eight)  hours as needed for nausea or vomiting. 20 tablet 0   rizatriptan (MAXALT) 10 MG tablet Take 10 mg by mouth as needed for migraine. May repeat in 2 hours if needed     topiramate (TOPAMAX) 50 MG tablet Take 50 mg by mouth 2 (two) times daily.     No current facility-administered medications for this visit.    Allergies:   Codeine   Social History:  The patient  reports that she quit smoking about 31 years ago. Her smoking use included cigarettes. She has a 8.00 pack-year smoking history. She has never used smokeless tobacco. She reports that she does not currently use alcohol. She reports that she does not use drugs.   Family History:  The patient's family history includes Alcohol abuse in her father; Atrial fibrillation in her brother; Cardiomyopathy in her daughter; Congestive Heart Failure in her mother; Kidney failure in her mother; Lung cancer in her father; Sudden Cardiac Death in her daughter; Wolff Parkinson White syndrome in her grandchild.  ROS:  Please see the history of present illness.    All other systems are reviewed and otherwise negative.   PHYSICAL EXAM:  VS:  There were no vitals taken for this visit. BMI: There is no height or weight on file to calculate BMI. Well nourished, well  developed, in no acute distress HEENT: normocephalic, atraumatic Neck: no JVD, carotid bruits or masses Cardiac: RRR; no significant murmurs, no rubs, or gallops Lungs:  CTA b/l, no wheezing, rhonchi or rales Abd: not examined today (post-op) MS: no deformity or atrophy Ext: no edema Skin: warm and dry, no rash Neuro:  No gross deficits appreciated, termor is known for her Psych: euthymic mood, full affect   EKG:  Done today and reviewed by myself shows  SR 77bpm, QTc   06/15/2020: TTE IMPRESSIONS   1. Left ventricular ejection fraction, by estimation, is 55 to 60%. The  left ventricle has normal function. The left ventricle has no regional  wall motion abnormalities. Left ventricular diastolic parameters were  normal.   2. Right ventricular systolic function is normal. The right ventricular  size is normal. Tricuspid regurgitation signal is inadequate for assessing  PA pressure.   3. The mitral valve is normal in structure. Trivial mitral valve  regurgitation. No evidence of mitral stenosis.   4. The aortic valve is tricuspid. Aortic valve regurgitation is not  visualized. No aortic stenosis is present.   Comparison(s): 04/17/17 EF 55-60%.   Conclusion(s)/Recommendation(s): Normal biventricular function without  evidence of hemodynamically significant valvular heart disease.    04/17/2017: LHC The left ventricular systolic function is normal. LV end diastolic pressure is moderately elevated. LVEDP 25 mm Hg. The left ventricular ejection fraction is 50-55% by visual estimate. There is no aortic valve stenosis. No angiographically apparent coronary artery disease.   Recent Labs: 09/16/2021: Hemoglobin 10.4; Platelets 159 09/19/2021: Magnesium 2.1 03/01/2022: ALT 33; BUN 16; Creat 0.73; Potassium 3.7; Sodium 141  No results found for requested labs within last 365 days.   CrCl cannot be calculated (Patient's most recent lab result is older than the maximum 21 days  allowed.).   Wt Readings from Last 3 Encounters:  05/30/22 (!) 309 lb (140.2 kg)  03/29/22 (!) 310 lb 3.2 oz (140.7 kg)  03/12/22 (!) 306 lb 12.8 oz (139.2 kg)     Other studies reviewed: Additional studies/records reviewed today include: summarized above  ASSESSMENT AND PLAN:  Paroxysmal Afib CHA2DS2Vasc is 4, on Eliquis Minimal burden on  Tikosyn QTc is stable meds  reviewed Teaching re-enforced today and cautioned on new meds to always confirm with the pharmcist or [place] to make sure is OK with her dofetilide  She is chronically on the Latuda, Qt looks ok   HTN Looks OK , no changes  3. Tremor Better on lower Depakote dose with plans to wean off C/w psychiatry/neurology   Disposition: F/u with [place] in 18mo, sooner if needed  Current medicines are reviewed at length with the patient today.  The patient did not have any concerns regarding medicines.  Norma Fredrickson, PA-C 06/03/2022 2:43 PM     [facility] [address] [place] [address] [phone] (office)  [phone] (fax)

## 2022-06-06 ENCOUNTER — Telehealth: Payer: Self-pay | Admitting: Family Medicine

## 2022-06-06 ENCOUNTER — Other Ambulatory Visit: Payer: Self-pay

## 2022-06-06 MED ORDER — DOFETILIDE 500 MCG PO CAPS: ORAL_CAPSULE | ORAL | 3 refills | Status: DC

## 2022-06-06 NOTE — Telephone Encounter (Signed)
Copy for FMLA form printed and place up front to be picked up. Dm/cma

## 2022-06-06 NOTE — Telephone Encounter (Signed)
Pt is wanting a call concerning her FMLA paperwork being filled out. Please advise pt @ [phone]

## 2022-06-06 NOTE — Telephone Encounter (Signed)
Pt's medication was sent to pt's pharmacy as requested. Confirmation received.  °

## 2022-06-10 ENCOUNTER — Telehealth: Payer: Self-pay

## 2022-06-10 ENCOUNTER — Ambulatory Visit (INDEPENDENT_AMBULATORY_CARE_PROVIDER_SITE_OTHER): Payer: BC Managed Care – PPO | Admitting: Physician Assistant

## 2022-06-10 ENCOUNTER — Encounter: Payer: Self-pay | Admitting: Physician Assistant

## 2022-06-10 VITALS — BP 118/74 | HR 77 | Ht 67.0 in | Wt 311.8 lb

## 2022-06-10 DIAGNOSIS — Z5181 Encounter for therapeutic drug level monitoring: Secondary | ICD-10-CM | POA: Diagnosis not present

## 2022-06-10 DIAGNOSIS — I10 Essential (primary) hypertension: Secondary | ICD-10-CM

## 2022-06-10 DIAGNOSIS — I480 Paroxysmal atrial fibrillation: Secondary | ICD-10-CM | POA: Diagnosis not present

## 2022-06-10 DIAGNOSIS — Z79899 Other long term (current) drug therapy: Secondary | ICD-10-CM

## 2022-06-10 LAB — BASIC METABOLIC PANEL
BUN/Creatinine Ratio: 17 (ref 9–23)
BUN: 14 mg/dL (ref 6–24)
CO2: 21 mmol/L (ref 20–29)
Calcium: 8.7 mg/dL (ref 8.7–10.2)
Chloride: 107 mmol/L — ABNORMAL HIGH (ref 96–106)
Creatinine, Ser: 0.82 mg/dL (ref 0.57–1.00)
Glucose: 115 mg/dL — ABNORMAL HIGH (ref 70–99)
Potassium: 3.8 mmol/L (ref 3.5–5.2)
Sodium: 140 mmol/L (ref 134–144)
eGFR: 82 mL/min/{1.73_m2} (ref >59)

## 2022-06-10 LAB — MAGNESIUM: Magnesium: 2.2 mg/dL (ref 1.6–2.3)

## 2022-06-10 NOTE — Telephone Encounter (Signed)
**Note De-Identified  Obfuscation** Dofetilide PA started through covermymeds. Key: [id number]

## 2022-06-10 NOTE — Patient Instructions (Signed)
Medication Instructions:   Your physician recommends that you continue on your current medications as directed. Please refer to the Current Medication list given to you today.   *If you need a refill on your cardiac medications before your next appointment, please call your pharmacy*   Lab Work:  BMET AND MAG TODAY   If you have labs (blood work) drawn today and your tests are completely normal, you will receive your results only by: MyChart Message (if you have MyChart) OR A paper copy in the mail If you have any lab test that is abnormal or we need to change your treatment, we will call you to review the results.   Testing/Procedures: NONE ORDERED  TODAY    Follow-Up: At [facility], you and your health needs are our priority.  As part of our continuing mission to provide you with exceptional heart care, we have created designated Provider Care Teams.  These Care Teams include your primary Cardiologist (physician) and Advanced Practice Providers (APPs -  Physician Assistants and Nurse Practitioners) who all work together to provide you with the care you need, when you need it.  We recommend signing up for the patient portal called "MyChart".  Sign up information is provided on this After Visit Summary.  MyChart is used to connect with patients for Virtual Visits (Telemedicine).  Patients are able to view lab/test results, encounter notes, upcoming appointments, etc.  Non-urgent messages can be sent to your provider as well.   To learn more about what you can do with MyChart, go to [email or web address].    Your next appointment:   4 month(s)  The format for your next appointment:   In Person  Provider:   Francis Dowse, PA-C    Other Instructions   Important Information About Sugar

## 2022-06-11 ENCOUNTER — Other Ambulatory Visit: Payer: Self-pay | Admitting: *Deleted

## 2022-06-11 DIAGNOSIS — Z79899 Other long term (current) drug therapy: Secondary | ICD-10-CM

## 2022-06-11 MED ORDER — POTASSIUM CHLORIDE ER 10 MEQ PO TBCR: 10.0000 meq | EXTENDED_RELEASE_TABLET | Freq: Every day | ORAL | 1 refills | Status: DC

## 2022-06-12 ENCOUNTER — Other Ambulatory Visit: Payer: Self-pay | Admitting: Cardiovascular Disease

## 2022-06-12 DIAGNOSIS — I480 Paroxysmal atrial fibrillation: Secondary | ICD-10-CM

## 2022-06-12 NOTE — Telephone Encounter (Signed)
Eliquis 5mg  refill request received. Patient is 61 years old, weight-141.4kg, Crea-0.82 on 06/10/2022, Diagnosis-Afib, and last seen by 08/10/2022 on 06/10/2022. Dose is appropriate based on dosing criteria. Will send in refill to requested pharmacy.

## 2022-06-14 NOTE — Telephone Encounter (Signed)
**Note De-Identified  Obfuscation** Letter received from [employer] stating that another Dofetilide PA needs be started as the last one was done under wrong plan for this pt.  New URGENT Dofetilide PA started through covermymeds. Key: [id number]

## 2022-06-18 ENCOUNTER — Ambulatory Visit: Payer: BC Managed Care – PPO | Admitting: Cardiovascular Disease

## 2022-06-21 ENCOUNTER — Other Ambulatory Visit: Payer: BC Managed Care – PPO

## 2022-06-21 DIAGNOSIS — Z79899 Other long term (current) drug therapy: Secondary | ICD-10-CM

## 2022-06-22 LAB — BASIC METABOLIC PANEL
BUN/Creatinine Ratio: 15 (ref 9–23)
BUN: 14 mg/dL (ref 6–24)
CO2: 21 mmol/L (ref 20–29)
Calcium: 9.5 mg/dL (ref 8.7–10.2)
Chloride: 104 mmol/L (ref 96–106)
Creatinine, Ser: 0.92 mg/dL (ref 0.57–1.00)
Glucose: 91 mg/dL (ref 70–99)
Potassium: 4.3 mmol/L (ref 3.5–5.2)
Sodium: 140 mmol/L (ref 134–144)
eGFR: 72 mL/min/{1.73_m2} (ref >59)

## 2022-08-09 ENCOUNTER — Encounter: Payer: Self-pay | Admitting: Nurse Practitioner

## 2022-08-09 ENCOUNTER — Ambulatory Visit (INDEPENDENT_AMBULATORY_CARE_PROVIDER_SITE_OTHER): Payer: BC Managed Care – PPO | Admitting: Nurse Practitioner

## 2022-08-09 VITALS — BP 135/87 | HR 108 | Temp 98.9°F | Wt 308.0 lb

## 2022-08-09 DIAGNOSIS — J101 Influenza due to other identified influenza virus with other respiratory manifestations: Secondary | ICD-10-CM | POA: Insufficient documentation

## 2022-08-09 DIAGNOSIS — R051 Acute cough: Secondary | ICD-10-CM

## 2022-08-09 LAB — POCT INFLUENZA A/B
Influenza A, POC: NEGATIVE
Influenza B, POC: NEGATIVE

## 2022-08-09 LAB — POC COVID19 BINAXNOW: SARS Coronavirus 2 Ag: NEGATIVE

## 2022-08-09 MED ORDER — DOXYCYCLINE HYCLATE 100 MG PO TABS: 100.0000 mg | ORAL_TABLET | Freq: Two times a day (BID) | ORAL | 0 refills | Status: DC

## 2022-08-09 MED ORDER — GUAIFENESIN ER 600 MG PO TB12: 600.0000 mg | ORAL_TABLET | Freq: Two times a day (BID) | ORAL | 0 refills | Status: DC | PRN

## 2022-08-09 NOTE — Patient Instructions (Signed)
It was great to see you!  Start doxycyline twice a day for 10 days. Take this with food. Also start mucinex twice a day to help with your cough and the congestion.   Let's follow-up if your symptoms worsen or don't improve.   Take care,  Vance Peper, NP

## 2022-08-09 NOTE — Assessment & Plan Note (Addendum)
Acute cough, most likely from URI. Covid-19 and flu test negative. With history of COPD will start doxycycline 100mg  BID x10 days. She denies shortness of breath and chest tightness, will hold off on prednisone. She can take ibuprofen/tylenol as needed for pain/fever and mucinex as needed for cough/congestion. Follow-up if symptoms worsen or don't improve.

## 2022-08-09 NOTE — Progress Notes (Signed)
Acute Office Visit  Subjective:     Patient ID: Regina Morales, female    DOB: [birth year], 61 y.o.   MRN: [id number]  Chief Complaint  Patient presents with   URI    Pt c/o diarrhea, cough, sore throat, nausea, head/body aches x3 days. Pt has not taken covid test.     HPI Patient is in today for sore throat, cough, and diarrhea since Wednesday.  UPPER RESPIRATORY TRACT INFECTION  Fever: no Cough: yes Shortness of breath: no Wheezing: no Chest pain: no Chest tightness: no Chest congestion: no Nasal congestion: yes Runny nose: no Post nasal drip: yes Sneezing: no Sore throat: yes Swollen glands: no Sinus pressure: no Headache: no Face pain: no Toothache: no Ear pain: no bilateral Ear pressure: no bilateral Eyes red/itching:no Eye drainage/crusting: no  Vomiting: no, however nauseous Rash: no Fatigue: yes Sick contacts: yes - roommate was sick with similar symptoms  Strep contacts: no  Context: worse Recurrent sinusitis: no Relief with OTC cold/cough medications: no  Treatments attempted: tylenol     ROS See pertinent positives and negatives per HPI.     Objective:    BP 135/87   Pulse (!) 108   Temp 98.9 F (37.2 C) (Oral)   Wt (!) 308 lb (139.7 kg)   SpO2 97%   BMI 48.24 kg/m    Physical Exam Vitals and nursing note reviewed.  Constitutional:      General: She is not in acute distress.    Appearance: Normal appearance.  HENT:     Head: Normocephalic.     Right Ear: Tympanic membrane, ear canal and external ear normal.     Left Ear: Tympanic membrane, ear canal and external ear normal.     Nose:     Right Sinus: No maxillary sinus tenderness or frontal sinus tenderness.     Left Sinus: No maxillary sinus tenderness or frontal sinus tenderness.     Mouth/Throat:     Pharynx: Posterior oropharyngeal erythema present. No oropharyngeal exudate.  Eyes:     Conjunctiva/sclera: Conjunctivae normal.  Cardiovascular:     Rate and Rhythm:  Normal rate and regular rhythm.     Pulses: Normal pulses.     Heart sounds: Normal heart sounds.  Pulmonary:     Effort: Pulmonary effort is normal.     Breath sounds: Normal breath sounds.  Musculoskeletal:     Cervical back: Normal range of motion and neck supple. No tenderness.  Lymphadenopathy:     Cervical: No cervical adenopathy.  Skin:    General: Skin is warm.  Neurological:     General: No focal deficit present.     Mental Status: She is alert and oriented to person, place, and time.  Psychiatric:        Mood and Affect: Mood normal.        Behavior: Behavior normal.        Thought Content: Thought content normal.        Judgment: Judgment normal.     Results for orders placed or performed in visit on 08/09/22  POC COVID-19  Result Value Ref Range   SARS Coronavirus 2 Ag Negative Negative  POCT Influenza A/B  Result Value Ref Range   Influenza A, POC Negative Negative   Influenza B, POC Negative Negative        Assessment & Plan:   Problem List Items Addressed This Visit       Other   Acute cough - Primary  Acute cough, most likely from URI. Covid-19 and flu test negative. With history of COPD will start doxycycline 100mg  BID x10 days. She denies shortness of breath and chest tightness, will hold off on prednisone. She can take ibuprofen/tylenol as needed for pain/fever and mucinex as needed for cough/congestion. Follow-up if symptoms worsen or don't improve.       Relevant Orders   POC COVID-19 (Completed)   POCT Influenza A/B (Completed)    Meds ordered this encounter  Medications   doxycycline (VIBRA-TABS) 100 MG tablet    Sig: Take 1 tablet (100 mg total) by mouth 2 (two) times daily.    Dispense:  20 tablet    Refill:  0   guaiFENesin (MUCINEX) 600 MG 12 hr tablet    Sig: Take 1 tablet (600 mg total) by mouth 2 (two) times daily as needed for cough or to loosen phlegm.    Dispense:  30 tablet    Refill:  0    Return if symptoms worsen or  fail to improve.  Charyl Dancer, NP

## 2022-08-23 NOTE — Progress Notes (Deleted)
CARDIOLOGY OFFICE NOTE  Date:  08/23/2022    Regina Morales Date of Birth: 03-10-1961 Medical Record #[id number]  PCP:  Loyola Mast, MD  Cardiologist:  Eden Emms  No chief complaint on file.   History of Present Illness: Regina Morales is a 61 y.o. female who presents today for f/U PAF and atypical chest pain. I have not seen her in over 2 years primary seen by afib clinic and EP Dr Elberta Fortis   She has a history of chest pain with a history of paroxysmal atrial fibrillation, CP, neg myoview in 2009, GERD, DVT and PE after childbirth    Cath 04/17/17 no CAD  Echo same day normal EF 55-60% mild LAE  08/27/19 QT 434 msec BMET on 10/27/19 K 3.9 Cr 1.01. Mg normal on 06/10/22 and K 4.3  She has 4 children. 85 yo daughter had sudden death and survived Has AICD She lost a 35 yo daughter to sudden death  She has two son's one in [place] and one in [place] who are currently ok  Has had RUQ pain and nausea  Lap choly and ERCP for stone with Magod 09/15/21   Held eliquis for EGD/Colon 03/26/22 had some reflux esophagitis with bleeding  CT abdomen benign same day   Seen by primary for URI 08/09/22 COVID/Flu negative Rx with doxycycline     Past Medical History:  Diagnosis Date   Anemia    hx years ago   Anxiety    years ago   Buzzing in ear    "right; even after OR"   Chest pain    a. 2009: neg MV  ([facility])   COPD (chronic obstructive pulmonary disease) (HCC) 2018   Depression    DVT (deep vein thrombosis) in pregnancy 1980's   LLE   Fibromyalgia    "dx'd many many years ago; I haven't had any problems w/it" (04/15/2017)   GERD (gastroesophageal reflux disease)    Hepatic steatosis    High cholesterol    History of blood transfusion    "low count after appendix surgery" unsure # of units transfused   Hypertension    Migraines    "maybe once/2 months" (04/15/2017)   Obesity    OSA on CPAP    Persistent atrial fibrillation (HCC)    a. Dx 07/2013;  b. CHA2DS2VASc=1  (female);  c. 07/2013 Echo:  EF 50-55%, no rwma, mod LVH.   Pulmonary embolism (HCC) ~ 1984   "after I had my last child"   SVD (spontaneous vaginal delivery)    x 4    Past Surgical History:  Procedure Laterality Date   APPENDECTOMY  1990's   BREAST CYST ASPIRATION Left 2019   @ [facility]   CHOLECYSTECTOMY N/A 09/14/2021   Procedure: LAPAROSCOPIC CHOLECYSTECTOMY WITH INTRAOPERATIVE CHOLANGIOGRAM;  Surgeon: Fritzi Mandes, MD;  Location: [facility];  Service: General;  Laterality: N/A;   COLONOSCOPY     DILATATION & CURETTAGE/HYSTEROSCOPY WITH MYOSURE N/A 04/14/2017   Procedure: DILATATION & CURETTAGE/HYSTEROSCOPY WITH MYOSURE;  Surgeon: Myna Hidalgo, DO;  Location: [facility];  Service: Gynecology;  Laterality: N/A;  Polypectomy   DILATION AND CURETTAGE OF UTERUS  1983   [facility]   ERCP N/A 09/15/2021   Procedure: ENDOSCOPIC RETROGRADE CHOLANGIOPANCREATOGRAPHY (ERCP);  Surgeon: Vida Rigger, MD;  Location: [facility] ENDOSCOPY;  Service: Endoscopy;  Laterality: N/A;   INNER EAR SURGERY Right 2000's   LEFT HEART CATH AND CORONARY ANGIOGRAPHY N/A 04/17/2017   Procedure: Left Heart Cath and Coronary  Angiography;  Surgeon: Jettie Booze, MD;  Location: [place] [facility];  Service: Cardiovascular;  Laterality: N/A;   PLANTAR FASCIA RELEASE Right 07/2015   REMOVAL OF STONES  09/15/2021   Procedure: REMOVAL OF STONES;  Surgeon: Clarene Essex, MD;  Location[facility] ENDOSCOPY;  Service: Endoscopy;;   SPHINCTEROTOMY  09/15/2021   Procedure: Joan Mayans;  Surgeon: Clarene Essex, MD;  Location: [facility];  Service: Endoscopy;;   TUBAL LIGATION  1985   ULNAR NERVE TRANSPOSITION Right      Medications: No outpatient medications have been marked as taking for the 08/29/22 encounter (Appointment) with Josue Hector, MD.     Allergies: Allergies  Allergen Reactions   Codeine Nausea And Vomiting, Other (See Comments) and Nausea Only    Social History: The patient  reports that she quit smoking about 31  years ago. Her smoking use included cigarettes. She has a 8.00 pack-year smoking history. She has never used smokeless tobacco. She reports that she does not currently use alcohol. She reports that she does not use drugs.   Family History: The patient's family history includes Alcohol abuse in her father; Atrial fibrillation in her brother; Cardiomyopathy in her daughter; Congestive Heart Failure in her mother; Kidney failure in her mother; Lung cancer in her father; Sudden Cardiac Death in her daughter; Wolff Parkinson White syndrome in her grandchild.   Review of Systems: Please see the history of present illness.   Otherwise, the review of systems is positive for none.   All other systems are reviewed and negative.   Physical Exam: VS:  There were no vitals taken for this visit. Marland Kitchen  BMI There is no height or weight on file to calculate BMI.  Wt Readings from Last 3 Encounters:  08/09/22 (!) 308 lb (139.7 kg)  06/10/22 (!) 311 lb 12.8 oz (141.4 kg)  05/30/22 (!) 309 lb (140.2 kg)   Affect appropriate Obese white female  HEENT: normal Neck supple with no adenopathy JVP normal no bruits no thyromegaly Lungs clear with no wheezing and good diaphragmatic motion Heart:  S1/S2 no murmur, no rub, gallop or click PMI normal Abdomen: benighn, BS positve, no tenderness, no AAA no bruit.  No HSM or HJR Distal pulses intact with no bruits No edema Neuro non-focal Skin warm and dry No muscular weakness    LABORATORY DATA:  EKG: . Reviewed apple watch strips see HPI  Lab Results  Component Value Date   WBC 7.3 09/16/2021   HGB 10.4 (L) 09/16/2021   HCT 31.8 (L) 09/16/2021   PLT 159 09/16/2021   GLUCOSE 91 06/21/2022   CHOL 175 04/30/2020   TRIG 114 04/30/2020   HDL 40 (L) 04/30/2020   LDLCALC 112 (H) 04/30/2020   ALT 33 (H) 03/01/2022   AST 29 03/01/2022   NA 140 06/21/2022   K 4.3 06/21/2022   CL 104 06/21/2022   CREATININE 0.92 06/21/2022   BUN 14 06/21/2022   CO2 21  06/21/2022   TSH 1.015 04/30/2020   INR 1.0 03/22/2019   HGBA1C 5.4 04/30/2020     BNP (last 3 results) No results for input(s): "BNP" in the last 8760 hours.  ProBNP (last 3 results) No results for input(s): "PROBNP" in the last 8760 hours.    Other Studies Reviewed Today:  Echo Study Conclusions 04/2017   - Left ventricle: The cavity size was normal. Systolic function was   normal. The estimated ejection fraction was in the range of 55%   to 60%.  Wall motion was normal; there were no regional wall   motion abnormalities. - Aortic valve: There was trivial regurgitation. - Left atrium: The atrium was mildly dilated.    Left Heart Cath and Coronary Angiography 04/2017  Conclusion     The left ventricular systolic function is normal. LV end diastolic pressure is moderately elevated. LVEDP 25 mm Hg. The left ventricular ejection fraction is 50-55% by visual estimate. There is no aortic valve stenosis. No angiographically apparent coronary artery disease.   Consider diuresis.  Continue preventive therapy.     CT CHEST ANGIOGRAM IMPRESSION: 1. No evidence of pulmonary embolus. 2. Bilateral dependent subsegmental atelectasis noted. Lungs otherwise clear.     Electronically Signed   By: Roanna Raider M.D.   On: 04/15/2017 01:50   Assessment/Plan:  1. Chest pain - non cardiac cath 04/17/17 no CAD   2. PAF - maintaining NSR on tikosyn and eliquis CHADVASC 4, QT stable Cautioned Her again about any use of Zofran with Tikosyn Failed flecainide   3. Prior PE/DVT - CT negative 04/15/17 on eliquis for PAF    4. Obesity - weight loss encouraged. Referred to [facility]    5. OSA:  F/u with primary for CPAP titration    6. HTN - controlled on her current regimen  7. Ortho:  F/u Orlan Leavens has had bilateral broken ankles and back issues   8. Family History of Sudden Death. Her two daughters have had events and they have different fathers so likely runs on patients  side. Will have her f/u with geneticist for possible testing   9. Depression:  Had been on Cymbalta with tremor and better on lower dose Depakote Daughter died in 03-10-20 ***     [name]

## 2022-08-29 ENCOUNTER — Ambulatory Visit: Payer: BC Managed Care – PPO | Admitting: Cardiovascular Disease

## 2022-09-05 ENCOUNTER — Ambulatory Visit: Payer: BC Managed Care – PPO | Admitting: Family Medicine

## 2022-09-17 NOTE — Progress Notes (Deleted)
Cardiology Office Note Date:  06/03/2022  Patient ID:  Regina Morales, DOB Jan 21, 1961, MRN [id number] PCP:  Loyola Mast, MD  Cardiologist:  Dr. Eden Emms Electrophysiologist: Dr. Elberta Fortis    Chief Complaint:   *** 4 mo  History of Present Illness: Regina Morales is a 61 y.o. female with history of HTN, OSA w/CPAP, Afib, DVT/PE (w/pregnancy), GERD, fibromyalgia.  She saw Dr. Elberta Fortis, 03/15/21, arhythmia burden down, sadly still grieving loss of her daughter, working with her MD, the cymbalta helping her depression though causing a tremor. QTc was stable, discussed importance when discussing her medicines/management with her PMD/psychiatrist that they are aware of her Tikosyn.  She had elective lap-chole 09/14/21 discharged 09/17/21 to resume her Eliquis 3 days post-op (11/15).  I saw her 09/19/21 She comes today accompanied by her roommate. Cardiac-wise she reports feeling OK No CP, no palpitations She is s/p lap-chole/ERCP and still sore, able to eat some yesterday better Roommate has been urging to her to mobilize The patient states she was called that Dr. Elberta Fortis wanted her to come in, was unclear as to why. She resumed Eliquis yesterday as instructed She reports compliance with medicines She has post op surgery follow up in place Note a tremor, in d/w pt/roommate, following with psychiatry and felt 2/2 medicines, the patient states that psychiatry mentioned a side effect of her medication though perhaps a reasonable comprimise for + benefits of her medicines. Urged ongoing discussions, roommate a bit frustrated says they only ever get video visits, they will continue discussions with her PMD and psychiatry provider We discussed her meds, especially Zofran and re-educated about Tikosyn and meds  Pre-op team addressed pre-op eval for colonoscopy in May.  I saw her Aug 2023 She is doing pretty well. Post lap-chole she struggled with GI issues and diarrhea, but the last  medication alosetron has done well. Her depakote dose has been reduced and her tremor improved with plans to taper her off all together. She notes by her watch and palpitations AFib with rates towards 130's, these are fairly infrequent and last a minute or so only. Outside of her watch alert and palpitations do not cause any other symptoms. No SOB No CP No near syncope or syncope. No bleeding or signs of bleeding No changes were made. Chronically on Latuda w/stable QTc Re-enforced tikosyn teaching  *** Tikosyn EKG, labs *** burden *** bleeding, eliquis, labs, dose *** meds  Afib hx Diagnosed 2014 Flecainide failed with recurrent arrhythmia Tikosyn started Nov 2019  Past Medical History:  Diagnosis Date   Anemia    hx years ago   Anxiety    years ago   Buzzing in ear    "right; even after OR"   Chest pain    a. 2009: neg MV  ([facility])   COPD (chronic obstructive pulmonary disease) (HCC) 2018   Depression    DVT (deep vein thrombosis) in pregnancy 1980's   LLE   Fibromyalgia    "dx'd many many years ago; I haven't had any problems w/it" (04/15/2017)   GERD (gastroesophageal reflux disease)    Hepatic steatosis    High cholesterol    History of blood transfusion    "low count after appendix surgery" unsure # of units transfused   Hypertension    Migraines    "maybe once/2 months" (04/15/2017)   Obesity    OSA on CPAP    Persistent atrial fibrillation (HCC)    a. Dx 07/2013;  b. CHA2DS2VASc=1 (female);  c. 07/2013 Echo:  EF 50-55%, no rwma, mod LVH.   Pulmonary embolism (HCC) ~ 1984   "after I had my last child"   SVD (spontaneous vaginal delivery)    x 4    Past Surgical History:  Procedure Laterality Date   APPENDECTOMY  1990's   BREAST CYST ASPIRATION Left 2019   @ [facility]   CHOLECYSTECTOMY N/A 09/14/2021   Procedure: LAPAROSCOPIC CHOLECYSTECTOMY WITH INTRAOPERATIVE CHOLANGIOGRAM;  Surgeon: Fritzi Mandes, MD;  Location: [facility];  Service: General;  Laterality:  N/A;   COLONOSCOPY     DILATATION & CURETTAGE/HYSTEROSCOPY WITH MYOSURE N/A 04/14/2017   Procedure: DILATATION & CURETTAGE/HYSTEROSCOPY WITH MYOSURE;  Surgeon: Myna Hidalgo, DO;  Location: [facility];  Service: Gynecology;  Laterality: N/A;  Polypectomy   DILATION AND CURETTAGE OF UTERUS  1983   [facility]   ERCP N/A 09/15/2021   Procedure: ENDOSCOPIC RETROGRADE CHOLANGIOPANCREATOGRAPHY (ERCP);  Surgeon: Vida Rigger, MD;  Location: [facility] ENDOSCOPY;  Service: Endoscopy;  Laterality: N/A;   INNER EAR SURGERY Right 2000's   LEFT HEART CATH AND CORONARY ANGIOGRAPHY N/A 04/17/2017   Procedure: Left Heart Cath and Coronary Angiography;  Surgeon: Corky Crafts, MD;  Location: [facility] INVASIVE CV LAB;  Service: Cardiovascular;  Laterality: N/A;   PLANTAR FASCIA RELEASE Right 07/2015   REMOVAL OF STONES  09/15/2021   Procedure: REMOVAL OF STONES;  Surgeon: Vida Rigger, MD;  Location: [facility] ENDOSCOPY;  Service: Endoscopy;;   SPHINCTEROTOMY  09/15/2021   Procedure: Dennison Mascot;  Surgeon: Vida Rigger, MD;  Location: [facility] ENDOSCOPY;  Service: Endoscopy;;   TUBAL LIGATION  1985   ULNAR NERVE TRANSPOSITION Right     Current Outpatient Medications  Medication Sig Dispense Refill   acetaminophen (TYLENOL) 500 MG tablet Take 2 tablets (1,000 mg total) by mouth 3 (three) times daily as needed for mild pain. 30 tablet 0   albuterol (VENTOLIN HFA) 108 (90 Base) MCG/ACT inhaler Inhale 2 puffs into the lungs every 6 (six) hours as needed for wheezing or shortness of breath.     alosetron (LOTRONEX) 0.5 MG tablet Take 0.5 mg by mouth 2 (two) times daily.     apixaban (ELIQUIS) 5 MG TABS tablet Take 1 tablet (5 mg total) by mouth 2 (two) times daily. 180 tablet 1   divalproex (DEPAKOTE ER) 250 MG 24 hr tablet Take 250 mg by mouth See admin instructions. Take with 500 mg for a total on 750 mg in the evening     divalproex (DEPAKOTE ER) 500 MG 24 hr tablet Take 500 mg by mouth See admin instructions. Take with 250 mg for a  total of 750 mg in the evening (Patient not taking: Reported on 05/30/2022)     dofetilide (TIKOSYN) 500 MCG capsule TAKE 1 CAPSULE(500 MCG) BY MOUTH TWICE DAILY 180 capsule 2   DULoxetine (CYMBALTA) 60 MG capsule Take 120 mg by mouth daily.     gabapentin (NEURONTIN) 100 MG capsule SMARTSIG:2 Capsule(s) By Mouth Every Evening     LATUDA 60 MG TABS Take 120 mg by mouth at bedtime.     metoprolol tartrate (LOPRESSOR) 50 MG tablet TAKE 1 TABLET TWICE A DAY 180 tablet 3   mirabegron ER (MYRBETRIQ) 50 MG TB24 tablet Take 50 mg by mouth daily.     naproxen (NAPROSYN) 500 MG tablet Take 1 tablet (500 mg total) by mouth 2 (two) times daily with a meal. 30 tablet 0   omeprazole (PRILOSEC) 40 MG capsule Take 1 capsule (40 mg total) by mouth daily.  30 capsule 3   ondansetron (ZOFRAN) 4 MG tablet Take 1 tablet (4 mg total) by mouth every 8 (eight) hours as needed for nausea or vomiting. 20 tablet 0   rizatriptan (MAXALT) 10 MG tablet Take 10 mg by mouth as needed for migraine. May repeat in 2 hours if needed     topiramate (TOPAMAX) 50 MG tablet Take 50 mg by mouth 2 (two) times daily.     No current facility-administered medications for this visit.    Allergies:   Codeine   Social History:  The patient  reports that she quit smoking about 31 years ago. Her smoking use included cigarettes. She has a 8.00 pack-year smoking history. She has never used smokeless tobacco. She reports that she does not currently use alcohol. She reports that she does not use drugs.   Family History:  The patient's family history includes Alcohol abuse in her father; Atrial fibrillation in her brother; Cardiomyopathy in her daughter; Congestive Heart Failure in her mother; Kidney failure in her mother; Lung cancer in her father; Sudden Cardiac Death in her daughter; Wolff Parkinson White syndrome in her grandchild.  ROS:  Please see the history of present illness.    All other systems are reviewed and otherwise negative.    PHYSICAL EXAM:  VS:  There were no vitals taken for this visit. BMI: There is no height or weight on file to calculate BMI. Well nourished, well developed, in no acute distress HEENT: normocephalic, atraumatic Neck: no JVD, carotid bruits or masses Cardiac: *** RRR; no significant murmurs, no rubs, or gallops Lungs:  *** CTA b/l, no wheezing, rhonchi or rales Abd: not examined today (post-op) MS: no deformity or atrophy Ext: *** no edema Skin: warm and dry, no rash Neuro:  No gross deficits appreciated, termor is known for her Psych: euthymic mood, full affect   EKG:  Done today and reviewed by myself shows  ***   06/15/2020: TTE IMPRESSIONS   1. Left ventricular ejection fraction, by estimation, is 55 to 60%. The  left ventricle has normal function. The left ventricle has no regional  wall motion abnormalities. Left ventricular diastolic parameters were  normal.   2. Right ventricular systolic function is normal. The right ventricular  size is normal. Tricuspid regurgitation signal is inadequate for assessing  PA pressure.   3. The mitral valve is normal in structure. Trivial mitral valve  regurgitation. No evidence of mitral stenosis.   4. The aortic valve is tricuspid. Aortic valve regurgitation is not  visualized. No aortic stenosis is present.   Comparison(s): 04/17/17 EF 55-60%.   Conclusion(s)/Recommendation(s): Normal biventricular function without  evidence of hemodynamically significant valvular heart disease.    04/17/2017: [facility] The left ventricular systolic function is normal. LV end diastolic pressure is moderately elevated. LVEDP 25 mm Hg. The left ventricular ejection fraction is 50-55% by visual estimate. There is no aortic valve stenosis. No angiographically apparent coronary artery disease.   Recent Labs: 09/16/2021: Hemoglobin 10.4; Platelets 159 09/19/2021: Magnesium 2.1 03/01/2022: ALT 33; BUN 16; Creat 0.73; Potassium 3.7; Sodium 141  No results  found for requested labs within last 365 days.   CrCl cannot be calculated (Patient's most recent lab result is older than the maximum 21 days allowed.).   Wt Readings from Last 3 Encounters:  05/30/22 (!) 309 lb (140.2 kg)  03/29/22 (!) 310 lb 3.2 oz (140.7 kg)  03/12/22 (!) 306 lb 12.8 oz (139.2 kg)     Other studies reviewed: Additional  studies/records reviewed today include: summarized above  ASSESSMENT AND PLAN:  Paroxysmal Afib CHA2DS2Vasc is 4, on Eliquis *** burden on Tikosyn *** QTc is stable *** meds  reviewed *** Teaching re-enforced today and cautioned on new meds to always confirm with the pharmcist or us to make sure is OK with her dofetilide  *** She is chronically on the Latuda, QTc looks ok   HTN *** Looks OK , no changes  3. Tremor Better on lower Depakote dose with plans to wean off C/w psychiatry/neurology   Disposition: ***  Current medicines are reviewed at length with the patient today.  The patient did not have any concerns regarding medicines.  Norma FredricksonSigned, Gwendelyn Lanting, PA-C 06/03/2022 2:43 PM     [facility] [address] [place] [address] [phone] (office)  [phone] (fax)

## 2022-09-18 ENCOUNTER — Encounter (HOSPITAL_COMMUNITY): Payer: Self-pay | Admitting: Pharmacy Technician

## 2022-09-18 ENCOUNTER — Emergency Department (HOSPITAL_COMMUNITY)
Admission: EM | Admit: 2022-09-18 | Discharge: 2022-09-19 | Disposition: A | Discharge status: Home / Self Care | Payer: Self-pay | Attending: Emergency Medicine | Admitting: Emergency Medicine

## 2022-09-18 ENCOUNTER — Other Ambulatory Visit: Payer: Self-pay

## 2022-09-18 ENCOUNTER — Emergency Department (HOSPITAL_COMMUNITY): Payer: Self-pay

## 2022-09-18 DIAGNOSIS — S39012A Strain of muscle, fascia and tendon of lower back, initial encounter: Secondary | ICD-10-CM | POA: Insufficient documentation

## 2022-09-18 DIAGNOSIS — X500XXA Overexertion from strenuous movement or load, initial encounter: Secondary | ICD-10-CM | POA: Insufficient documentation

## 2022-09-18 DIAGNOSIS — M5432 Sciatica, left side: Secondary | ICD-10-CM | POA: Insufficient documentation

## 2022-09-18 MED ORDER — DICLOFENAC EPOLAMINE 1.3 % EX PTCH: 1.0000 | MEDICATED_PATCH | Freq: Two times a day (BID) | CUTANEOUS | 0 refills | Status: DC

## 2022-09-18 MED ORDER — PREDNISONE 5 MG PO TABS
50.0000 mg | ORAL_TABLET | Freq: Once | ORAL | Status: AC
  Administered 2022-09-18: 50 mg via ORAL
  Filled 2022-09-18: qty 2

## 2022-09-18 MED ORDER — PREDNISONE 50 MG PO TABS: 50.0000 mg | ORAL_TABLET | Freq: Every day | ORAL | 0 refills | Status: AC

## 2022-09-18 MED ORDER — PREDNISONE 10 MG (21) PO TBPK: ORAL_TABLET | ORAL | 0 refills | Status: DC

## 2022-09-18 NOTE — Discharge Instructions (Addendum)
You were seen in the emergency room for back pain. This is likely because your know history of spinal stenosis and changes in your vertebra with probably was made worse with the recent activity changes at your work. Since you have been taking high doses of tylenol and ibuprofen, we are prescribing you with steroids.  We are giving you a dose of steroids here and you will need to pick up the following 4 doses at the pharmacy. Take one 50 mg pill every morning for the next 4 days. This should help with the inflammation. Please follow up with your primary care provider in one week to make sure your symptoms are resolves and for appropriate referrals should you need some.  Return to the ED if you feel your pain is worsening, you develop inability to pee or pass feces, changes in your ability to walk or feel your extremities.  Take care, Morene Crocker, MD

## 2022-09-18 NOTE — ED Provider Triage Note (Signed)
Emergency Medicine Provider Triage Evaluation Note  Rickell A [place] , a 61 y.o. female  was evaluated in triage.  Pt complains of back pain L.R no red flag sxx  Review of Systems  Positive: Back pain  Negative: fever  Physical Exam  BP 138/86   Pulse 74   Temp 98.3 F (36.8 C)   Resp 20   SpO2 100%  Gen:   Awake, no distress   Resp:  Normal effort  MSK:   Moves extremities without difficulty  Other:  TTP Left paraspinals  Medical Decision Making  Medically screening exam initiated at 6:20 PM.  Appropriate orders placed.  Ramandeep A [place] was informed that the remainder of the evaluation will be completed by another provider, this initial triage assessment does not replace that evaluation, and the importance of remaining in the ED until their evaluation is complete.  Work up initiated   [employer], PA-C 09/18/22 1821

## 2022-09-18 NOTE — ED Triage Notes (Signed)
Pt here with reports of lower back pain L>R after lifting boxes earlier today. Pain increases with movement.

## 2022-09-18 NOTE — ED Provider Notes (Signed)
[facility] EMERGENCY DEPARTMENT Provider Note   CSN: [id number] Arrival date & time: 09/18/22  1423     History  Chief Complaint  Patient presents with   Back Pain    Regina Morales is a 61 y.o. female with a history of HTN, OSA, GERD, fibromyalgia presenting to the ED with acute on chronic back pain  Patient has been moving heavy boxes at work since late last week, with exacerbation of her chronic low back pain. She lifted more heavy boxes this AM and felt she could not control the pain despite taking 3g of tylenol per day and 800 mg ibuprofen twice a day for the past week.  Patient denies fever, urinary or fecal incontinence, perianal sensory changes, weakness with ambulation, sensory changes in lower extremity. Patient denies dysuria, polyuria, suprapubic tenderness, or radiation of the pain.     Back Pain      Home Medications Prior to Admission medications   Medication Sig Start Date End Date Taking? Authorizing Provider  diclofenac (FLECTOR) 1.3 % PTCH Place 1 patch onto the skin 2 (two) times daily. 09/18/22  Yes Morene Crocker, MD  predniSONE (DELTASONE) 50 MG tablet Take 1 tablet (50 mg total) by mouth daily for 4 days. 09/18/22 09/22/22 Yes Morene Crocker, MD  acetaminophen (TYLENOL) 500 MG tablet Take 2 tablets (1,000 mg total) by mouth 3 (three) times daily as needed for mild pain. 09/16/21   Fritzi Mandes, MD  albuterol (VENTOLIN HFA) 108 (90 Base) MCG/ACT inhaler Inhale 2 puffs into the lungs every 6 (six) hours as needed for wheezing or shortness of breath.    [provider]  alosetron (LOTRONEX) 0.5 MG tablet Take 0.5 mg by mouth 2 (two) times daily.    [provider]  apixaban (ELIQUIS) 5 MG TABS tablet TAKE 1 TABLET BY MOUTH TWICE A DAY 06/12/22   Wendall Stade, MD  divalproex (DEPAKOTE ER) 250 MG 24 hr tablet Take 250 mg by mouth See admin instructions. Take with 500 mg for a total on 750 mg in the  evening    [provider]  dofetilide (TIKOSYN) 500 MCG capsule TAKE 1 CAPSULE(500 MCG) BY MOUTH TWICE DAILY 06/06/22   Wendall Stade, MD  doxycycline (VIBRA-TABS) 100 MG tablet Take 1 tablet (100 mg total) by mouth 2 (two) times daily. 08/09/22   McElwee, Lauren A, NP  DULoxetine (CYMBALTA) 60 MG capsule Take 120 mg by mouth daily.    [provider]  gabapentin (NEURONTIN) 100 MG capsule SMARTSIG:2 Capsule(s) By Mouth Every Evening 02/16/22   [provider]  guaiFENesin (MUCINEX) 600 MG 12 hr tablet Take 1 tablet (600 mg total) by mouth 2 (two) times daily as needed for cough or to loosen phlegm. 08/09/22   McElwee, Lauren A, NP  LATUDA 60 MG TABS Take 120 mg by mouth at bedtime. 07/18/21   [provider]  metoprolol tartrate (LOPRESSOR) 50 MG tablet TAKE 1 TABLET TWICE A DAY 05/31/21   Camnitz, Andree Coss, MD  mirabegron ER (MYRBETRIQ) 50 MG TB24 tablet Take 50 mg by mouth daily.    [provider]  potassium chloride (KLOR-CON) 10 MEQ tablet Take 1 tablet (10 mEq total) by mouth daily. 06/11/22 09/09/22  Sheilah Pigeon, PA-C  rizatriptan (MAXALT) 10 MG tablet Take 10 mg by mouth as needed for migraine. May repeat in 2 hours if needed    [provider]  topiramate (TOPAMAX) 50 MG tablet Take 50 mg  by mouth 2 (two) times daily. 08/31/20   [provider]      Allergies    Codeine    Review of Systems   Review of Systems  Musculoskeletal:  Positive for back pain.    Physical Exam Updated Vital Signs BP 136/77   Pulse 79   Temp 98.3 F (36.8 C)   Resp 18   SpO2 99%  Physical Exam Constitutional:      Appearance: Normal appearance.     Comments: Mild distress  Eyes:     Extraocular Movements: Extraocular movements intact.     Pupils: Pupils are equal, round, and reactive to light.  Cardiovascular:     Rate and Rhythm: Normal rate and regular rhythm.  Musculoskeletal:        General: No swelling, deformity or signs  of injury. Normal range of motion.     Right lower leg: No edema.     Left lower leg: No edema.     Comments: Tenderness to palpation of L paraspinal muscle and superior L buttocks. -straight leg raise on the L. Otherwise not tenderness spinous processes. Patient able to flex and extend lumbal spine. Mild discomfort during examination.  Skin:    General: Skin is warm and dry.     Findings: Erythema present. No bruising.  Neurological:     General: No focal deficit present.     Mental Status: She is alert and oriented to person, place, and time. Mental status is at baseline.     Sensory: No sensory deficit.     Motor: No weakness.     Coordination: Coordination normal.     Gait: Gait normal.     Deep Tendon Reflexes: Reflexes normal.  Psychiatric:        Mood and Affect: Mood normal.        Behavior: Behavior normal.     ED Results / Procedures / Treatments   Labs (all labs ordered are listed, but only abnormal results are displayed) Labs Reviewed - No data to display  EKG None  Radiology DG Lumbar Spine Complete  Result Date: 09/18/2022 CLINICAL DATA:  Back pain, was lifting something heavy and now has had lower back pain for 1 day EXAM: LUMBAR SPINE - COMPLETE 4+ VIEW COMPARISON:  04/05/2019 FINDINGS: Hypoplastic last RIGHT rib. Five additional non-rib-bearing lumbar vertebra. Osseous demineralization. Disc space narrowing and slight endplate spur formation at L4-L5 with associated endplate sclerosis. Vertebral body heights and remaining disc space heights maintained. No fracture, subluxation, bone destruction, or spondylolysis. SI joints preserved. IMPRESSION: Degenerative disc disease changes L4-L5, slightly increased from previous exam. Osseous demineralization. No acute abnormalities. Electronically Signed   By: Ulyses Southward M.D.   On: 09/18/2022 19:04    Procedures Procedures    Medications Ordered in ED Medications  predniSONE (DELTASONE) tablet 50 mg (has no  administration in time range)    ED Course/ Medical Decision Making/ A&P                           Medical Decision Making Risk Prescription drug management.   Patient with known chronic lumbosacral radiculopathy, spinal stenosis and degenerative disc changes presenting with acute on chronic back pain in the setting of increased activity at work with heavy boxes and no acute red flags DDx increase disc disease, fractures, worsening foraminal narrowing, lumbosacral strain. Physical exam with muscular tenderness on L paraspinal muscles and radiculopathy to the mid buttocks. XR without acute  abnormalities but progression of degenerative disc disease in L4-L5. Suspect this is likely a lumbosacral strain with exacerbation of known radiculopathy.  Patient is comfortable at rest, with increased discomfort during examination and movement. Given prolonged NSAID and tylenol therapy, will prescribe a short steroid course to aid with inflammation. Will administer first 50mg  prednisone dose here and will prescribe a 4 day course for outpatient treatment, for a total of 5 days. Will also prescribe diclofenac patches for symptomatic relief. Advised patient to follow up with primary care provider to ensure symptom follow up and appropriate referrals should she need them. Precautionary symptoms for return reviewed. Patient in agreement with plan and stable for discharge.   Final Clinical Impression(s) / ED Diagnoses Final diagnoses:  Lumbosacral strain, initial encounter  Sciatica of left side    Rx / DC Orders ED Discharge Orders          Ordered    predniSONE (STERAPRED UNI-PAK 21 TAB) 10 MG (21) TBPK tablet  Status:  Discontinued        09/18/22 2222    diclofenac (FLECTOR) 1.3 % PTCH  2 times daily        09/18/22 2222    predniSONE (DELTASONE) 50 MG tablet  Daily        09/18/22 2238              09/20/22, MD 09/18/22 09/20/22    [id number], MD 09/18/22 2315

## 2022-09-19 ENCOUNTER — Telehealth: Payer: Self-pay | Admitting: Cardiology

## 2022-09-19 ENCOUNTER — Other Ambulatory Visit: Payer: Self-pay | Admitting: Student

## 2022-09-19 ENCOUNTER — Ambulatory Visit: Payer: BC Managed Care – PPO | Admitting: Physician Assistant

## 2022-09-19 NOTE — Telephone Encounter (Signed)
Called pt advised of pharmacist response:  Yes, should be ok to take. Please let patient know there can be an increased risk of bleeding.   Advised pt to monitor for bleeding. Expresses understanding.  No further concerns voiced.

## 2022-09-19 NOTE — Telephone Encounter (Signed)
Yes, should be ok to take. Please let patient know there can be an increased risk of bleeding.

## 2022-09-19 NOTE — Telephone Encounter (Signed)
Pt c/o medication issue:  1. Name of Medication: Prednisone   2. How are you currently taking this medication (dosage and times per day)?   3. Are you having a reaction (difficulty breathing--STAT)?   4. What is your medication issue?  Patient would like to know if it will be okay to take this medication.

## 2022-09-20 ENCOUNTER — Telehealth: Payer: Self-pay

## 2022-09-20 NOTE — Telephone Encounter (Signed)
Transition Care Management Unsuccessful Follow-up Telephone Call  Date of discharge and from where:  09/19/22 [facility] ED. Dx: Lumbosacral strain.  Attempts:  1st Attempt  Reason for unsuccessful TCM follow-up call:  Left voice message

## 2022-09-25 NOTE — Telephone Encounter (Signed)
Transition Care Management Unsuccessful Follow-up Telephone Call  Date of discharge and from where:  09/19/22 [facility] ED. Dx: Lumbosacral strain.   Attempts:  2nd Attempt  Reason for unsuccessful TCM follow-up call:  Left voice message

## 2022-09-25 NOTE — Telephone Encounter (Signed)
Transition Care Management Follow-up Telephone Call Date of discharge and from where: 09/19/22 [facility] ED. Dx: Lumbosacral strain How have you been since you were released from the hospital? I'm better Any questions or concerns? No  Items Reviewed: Did the pt receive and understand the discharge instructions provided? Yes  Medications obtained and verified? Yes  Other? No  Any new allergies since your discharge? No  Dietary orders reviewed? No Do you have support at home? Yes   Home Care and Equipment/Supplies: Were home health services ordered? not applicable If so, what is the name of the agency? N/a  Has the agency set up a time to come to the patient's home? not applicable Were any new equipment or medical supplies ordered?  No What is the name of the medical supply agency? N/a Were you able to get the supplies/equipment? not applicable Do you have any questions related to the use of the equipment or supplies? No  Functional Questionnaire: (I = Independent and D = Dependent) ADLs: I  Bathing/Dressing- I  Meal Prep- I  Eating- I  Maintaining continence- I  Transferring/Ambulation- I  Managing Meds- I  Follow up appointments reviewed:  PCP Hospital f/u appt confirmed? Yes  Scheduled to see Dr. Veto Kemps on 10/09/22 @ 11:00am. Specialist Hospital f/u appt confirmed? No  Scheduled to see n/a on n/a @ n/a. Are transportation arrangements needed? No  If their condition worsens, is the pt aware to call PCP or go to the Emergency Dept.? Yes Was the patient provided with contact information for the PCP's office or ED? Yes Was to pt encouraged to call back with questions or concerns? Yes  Arvil Persons, RN, BSN RN Clinical Supervisor [facility] [employer]

## 2022-10-09 ENCOUNTER — Inpatient Hospital Stay: Payer: Medicaid Other | Admitting: Family Medicine

## 2022-10-30 ENCOUNTER — Inpatient Hospital Stay: Payer: Medicaid Other | Admitting: Family Medicine

## 2022-11-18 ENCOUNTER — Encounter: Payer: Self-pay | Admitting: Nurse Practitioner

## 2022-11-18 ENCOUNTER — Ambulatory Visit: Payer: Medicaid Other | Admitting: Nurse Practitioner

## 2022-11-18 VITALS — BP 130/88 | Ht 67.0 in | Wt 308.0 lb

## 2022-11-18 DIAGNOSIS — R0981 Nasal congestion: Secondary | ICD-10-CM

## 2022-11-18 DIAGNOSIS — U071 COVID-19: Secondary | ICD-10-CM | POA: Diagnosis not present

## 2022-11-18 LAB — POC COVID19 BINAXNOW: SARS Coronavirus 2 Ag: POSITIVE — AB

## 2022-11-18 MED ORDER — FLUTICASONE PROPIONATE 50 MCG/ACT NA SUSP: 2.0000 | Freq: Every day | NASAL | 0 refills | Status: DC

## 2022-11-18 MED ORDER — MOLNUPIRAVIR EUA 200MG CAPSULE: 4.0000 | ORAL_CAPSULE | Freq: Two times a day (BID) | ORAL | 0 refills | Status: AC

## 2022-11-18 NOTE — Progress Notes (Signed)
Acute Office Visit  Subjective:     Patient ID: Regina Morales, female    DOB: Nov 21, 1960, 62 y.o.   MRN: [id number]  Chief Complaint  Patient presents with   Acute Visit    Congestion ,  sinus  congestion , coughing, body aches chills,  no fever ,     HPI Patient is in today for nasal congestion, coughing, and body aches for 4 days.  UPPER RESPIRATORY TRACT INFECTION  Fever: no Cough: yes Shortness of breath: yes Wheezing: no Chest pain: no Chest tightness: no Chest congestion: no Nasal congestion: yes Runny nose: yes Post nasal drip: yes Sneezing: yes Sore throat: yes Swollen glands: no Sinus pressure: yes Headache: yes Face pain: no Toothache: no Ear pain: no bilateral Ear pressure: yes bilateral Eyes red/itching:yes Eye drainage/crusting: no  Vomiting: no Rash: no Fatigue: yes Sick contacts: yes  Strep contacts: no  Context: stable Recurrent sinusitis: no Relief with OTC cold/cough medications: no  Treatments attempted: mucinex, nasal spray afrin, throat lozenge   ROS See pertinent positives and negatives per HPI.     Objective:    BP 130/88   Ht 5\' 7"  (1.702 m)   Wt (!) 308 lb (139.7 kg)   BMI 48.24 kg/m    Physical Exam Vitals and nursing note reviewed.  Constitutional:      General: She is not in acute distress.    Appearance: Normal appearance.  HENT:     Head: Normocephalic.     Right Ear: Tympanic membrane, ear canal and external ear normal.     Left Ear: Tympanic membrane, ear canal and external ear normal.  Eyes:     Conjunctiva/sclera: Conjunctivae normal.  Cardiovascular:     Rate and Rhythm: Normal rate and regular rhythm.     Pulses: Normal pulses.     Heart sounds: Normal heart sounds.  Pulmonary:     Effort: Pulmonary effort is normal.     Breath sounds: Normal breath sounds.  Musculoskeletal:     Cervical back: Normal range of motion and neck supple. No tenderness.  Lymphadenopathy:     Cervical: No cervical  adenopathy.  Skin:    General: Skin is warm.  Neurological:     General: No focal deficit present.     Mental Status: She is alert and oriented to person, place, and time.  Psychiatric:        Mood and Affect: Mood normal.        Behavior: Behavior normal.        Thought Content: Thought content normal.        Judgment: Judgment normal.     Results for orders placed or performed in visit on 11/18/22  POC COVID-19 BinaxNow  Result Value Ref Range   SARS Coronavirus 2 Ag Positive (A) Negative        Assessment & Plan:   Problem List Items Addressed This Visit       Other   COVID-19 - Primary    With tikosyn and eliquis, will treat her with molnupiravir BID x5 days. Encourage rest, fluids. Can use nasal saline and/or flonase as needed for congestion. Stop afrin, prevent rebound congestion. Use albuterol inhaler as needed for wheezing or shortness of breath. Work note given.   Reviewed home care instructions for COVID. Advised self-isolation at home for at least 5 days. After 5 days, if improved and fever resolved, can be in public, but should wear a mask around others for an additional 5  days. If symptoms, esp, dyspnea develops/worsens, recommend in-person evaluation at either an urgent care or the emergency room.       Relevant Medications   molnupiravir EUA (LAGEVRIO) 200 mg CAPS capsule   Other Visit Diagnoses     Nasal congestion       Positive covid-19. Can use flonase and nasal saline spray prn nasal congestion. Stop afrin.   Relevant Orders   POC COVID-19 BinaxNow (Completed)       Meds ordered this encounter  Medications   molnupiravir EUA (LAGEVRIO) 200 mg CAPS capsule    Sig: Take 4 capsules (800 mg total) by mouth 2 (two) times daily for 5 days.    Dispense:  40 capsule    Refill:  0   fluticasone (FLONASE) 50 MCG/ACT nasal spray    Sig: Place 2 sprays into both nostrils daily.    Dispense:  16 g    Refill:  0    Return if symptoms worsen or fail to  improve.  Charyl Dancer, NP

## 2022-11-18 NOTE — Patient Instructions (Signed)
It was great to see you!  Start molnupiravir twice a day for 5 days. Drink plenty of fluids and get rest.   Start flonase nasal spray once a day.   Stop the afrin.   Let's follow-up if your symptoms worsen or don't improve.   Take care,  Vance Peper, NP

## 2022-11-18 NOTE — Assessment & Plan Note (Addendum)
With tikosyn and eliquis, will treat her with molnupiravir BID x5 days. Encourage rest, fluids. Can use nasal saline and/or flonase as needed for congestion. Stop afrin, prevent rebound congestion. Use albuterol inhaler as needed for wheezing or shortness of breath. Work note given.   Reviewed home care instructions for COVID. Advised self-isolation at home for at least 5 days. After 5 days, if improved and fever resolved, can be in public, but should wear a mask around others for an additional 5 days. If symptoms, esp, dyspnea develops/worsens, recommend in-person evaluation at either an urgent care or the emergency room.

## 2022-11-24 ENCOUNTER — Other Ambulatory Visit: Payer: Self-pay | Admitting: Nurse Practitioner

## 2022-11-27 ENCOUNTER — Ambulatory Visit: Payer: Medicaid Other | Admitting: Family Medicine

## 2022-11-27 ENCOUNTER — Encounter: Payer: Self-pay | Admitting: Family Medicine

## 2022-11-27 VITALS — BP 124/88 | HR 74 | Temp 97.3°F | Ht 67.0 in | Wt 311.0 lb

## 2022-11-27 DIAGNOSIS — G43909 Migraine, unspecified, not intractable, without status migrainosus: Secondary | ICD-10-CM

## 2022-11-27 MED ORDER — METOCLOPRAMIDE HCL 5 MG PO TABS: 5.0000 mg | ORAL_TABLET | Freq: Three times a day (TID) | ORAL | 0 refills | Status: DC | PRN

## 2022-11-27 MED ORDER — ZOLMITRIPTAN 5 MG PO TABS: 5.0000 mg | ORAL_TABLET | ORAL | 2 refills | Status: DC | PRN

## 2022-11-27 NOTE — Progress Notes (Signed)
[facility] PRIMARY CARE [facility] [address] [place] [address] Dept: [phone] Dept Fax: [phone]  Office Visit  Subjective:    Patient ID: Regina Morales, female    DOB: 05/10/1961, 62 y.o..   MRN: [id number]  Chief Complaint  Patient presents with   Acute Visit    Headache for 2 days and now having nausea and vomiting    History of Present Illness:  Patient is in today complaining of a 2-day history of a headache. She notes this involves the frontal scalp, then radiates around by the ears to the occiput. She is having associated photophobia and phonophobia. She is also experiencing nausea and vomiting. Vomiting does provide some temporary relief of the headache. She has been using Tylenol, but the headache recurs. Regina Morales has a history of migraine headaches. She has not had a headache like this in some time now.  Past Medical History: Patient Active Problem List   Diagnosis Date Noted   COVID-19 11/18/2022   Acute cough 08/09/2022   Gastritis without bleeding 03/29/2022   Irritable bowel syndrome with diarrhea 03/29/2022   NAFLD (nonalcoholic fatty liver disease) 03/29/2022   Postcholecystectomy diarrhea 12/07/2021   Sensorineural hearing loss (SNHL) of both ears 12/07/2021   Urinary incontinence 12/07/2021   Epigastric abdominal pain 08/20/2021   Diverticulitis 06/08/2021   Lumbar degenerative disc disease 05/28/2021   Mild spinal stenosis at L4-L5 level 05/28/2021   Severe recurrent major depression without psychotic features ([place]) 04/30/2020   OSA (obstructive sleep apnea) 07/27/2019   Stage 3 severe COPD by GOLD classification ([place]) 06/24/2019   Fibromyalgia 05/03/2019   Venous insufficiency of both lower extremities 04/29/2019   Visit for monitoring Tikosyn therapy 09/15/2018   Endometrial polyp    Normocytic anemia 04/24/2014   Migraine 04/22/2014   PAF (paroxysmal atrial fibrillation) (HCC)    GERD  (gastroesophageal reflux disease)    BMI 40.0-44.9, adult [facility])    Past Surgical History:  Procedure Laterality Date   APPENDECTOMY  1990's   BREAST CYST ASPIRATION Left 2019   @ [facility]   CHOLECYSTECTOMY N/A 09/14/2021   Procedure: LAPAROSCOPIC CHOLECYSTECTOMY WITH INTRAOPERATIVE CHOLANGIOGRAM;  Surgeon: Dwan Bolt, MD;  Location: [facility];  Service: General;  Laterality: N/A;   COLONOSCOPY     DILATATION & CURETTAGE/HYSTEROSCOPY WITH MYOSURE N/A 04/14/2017   Procedure: DILATATION & CURETTAGE/HYSTEROSCOPY WITH MYOSURE;  Surgeon: Janyth Pupa, DO;  Location: [facility];  Service: Gynecology;  Laterality: N/A;  Polypectomy   DILATION AND CURETTAGE OF UTERUS  1983   [facility]   ERCP N/A 09/15/2021   Procedure: ENDOSCOPIC RETROGRADE CHOLANGIOPANCREATOGRAPHY (ERCP);  Surgeon: Clarene Essex, MD;  Location: [facility];  Service: Endoscopy;  Laterality: N/A;   INNER EAR SURGERY Right 2000's   LEFT HEART CATH AND CORONARY ANGIOGRAPHY N/A 04/17/2017   Procedure: Left Heart Cath and Coronary Angiography;  Surgeon: Jettie Booze, MD;  Location: [facility];  Service: Cardiovascular;  Laterality: N/A;   PLANTAR FASCIA RELEASE Right 07/2015   REMOVAL OF STONES  09/15/2021   Procedure: REMOVAL OF STONES;  Surgeon: Clarene Essex, MD;  Location: [facility];  Service: Endoscopy;;   SPHINCTEROTOMY  09/15/2021   Procedure: Joan Mayans;  Surgeon: Clarene Essex, MD;  Location: [facility] ENDOSCOPY;  Service: Endoscopy;;   TUBAL LIGATION  1985   ULNAR NERVE TRANSPOSITION Right    Family History  Problem Relation Age of Onset   Lung cancer Father    Alcohol abuse Father    Kidney  failure Mother        s/p renal Tx   Congestive Heart Failure Mother    Atrial fibrillation Brother    Sudden Cardiac Death Daughter        cardiac arrest   Cardiomyopathy Daughter    Delorse Limber White syndrome Grandchild         granddaughter   Outpatient Medications Prior to Visit  Medication Sig Dispense Refill    acetaminophen (TYLENOL) 500 MG tablet Take 2 tablets (1,000 mg total) by mouth 3 (three) times daily as needed for mild pain. 30 tablet 0   albuterol (VENTOLIN HFA) 108 (90 Base) MCG/ACT inhaler Inhale 2 puffs into the lungs every 6 (six) hours as needed for wheezing or shortness of breath.     alosetron (LOTRONEX) 0.5 MG tablet Take 0.5 mg by mouth 2 (two) times daily.     apixaban (ELIQUIS) 5 MG TABS tablet TAKE 1 TABLET BY MOUTH TWICE A DAY 180 tablet 3   divalproex (DEPAKOTE ER) 250 MG 24 hr tablet Take 250 mg by mouth See admin instructions. Take with 500 mg for a total on 750 mg in the evening     dofetilide (TIKOSYN) 500 MCG capsule TAKE 1 CAPSULE(500 MCG) BY MOUTH TWICE DAILY 180 capsule 3   doxycycline (VIBRA-TABS) 100 MG tablet Take 1 tablet (100 mg total) by mouth 2 (two) times daily. 20 tablet 0   DULoxetine (CYMBALTA) 60 MG capsule Take 120 mg by mouth daily.     fluticasone (FLONASE) 50 MCG/ACT nasal spray Place 2 sprays into both nostrils daily. 16 g 0   gabapentin (NEURONTIN) 100 MG capsule SMARTSIG:2 Capsule(s) By Mouth Every Evening     guaiFENesin (MUCINEX) 600 MG 12 hr tablet Take 1 tablet (600 mg total) by mouth 2 (two) times daily as needed for cough or to loosen phlegm. 30 tablet 0   LATUDA 60 MG TABS Take 120 mg by mouth at bedtime.     metoprolol tartrate (LOPRESSOR) 50 MG tablet TAKE 1 TABLET TWICE A DAY 180 tablet 3   mirabegron ER (MYRBETRIQ) 50 MG TB24 tablet Take 50 mg by mouth daily.     topiramate (TOPAMAX) 50 MG tablet Take 50 mg by mouth 2 (two) times daily.     potassium chloride (KLOR-CON) 10 MEQ tablet Take 1 tablet (10 mEq total) by mouth daily. 90 tablet 1   No facility-administered medications prior to visit.   Allergies  Allergen Reactions   Codeine Nausea And Vomiting, Other (See Comments) and Nausea Only     Objective:   Today's Vitals   11/27/22 1550  BP: 124/88  Pulse: 74  Temp: (!) 97.3 F (36.3 C)  TempSrc: Tympanic  SpO2: 98%  Weight:  (!) 311 lb (141.1 kg)  Height: 5\' 7"  (1.702 m)   Body mass index is 48.71 kg/m.   General: Well developed, well nourished. No acute distress. HEENT: Normocephalic, non-traumatic. PERRL, EOMI. Conjunctiva clear. Fundiscopic exam shows normal disc   and vasculature. External ears normal. EAC and TMs normal bilaterally. Nose clear without congestion or   rhinorrhea. Mucous membranes moist. Oropharynx clear. Good dentition. Neck: Supple. No lymphadenopathy. No thyromegaly. CV: RRR without murmurs or rubs. Pulses 2+ bilaterally. Neuro: No focal neurological deficits. Psych: Alert and oriented. Normal mood and affect.  Health Maintenance Due  Topic Date Due   Hepatitis C Screening  Never done   COVID-19 Vaccine (3 - Moderna risk series) 07/31/2020     Assessment & Plan:  1. Acute migraine Ms. Vultaggio headache is consistent with an acute migraine. I will provide a triptan to try and break this. I will also provide some Reglan for nausea. If not resolve din 2 days, she should reach back out to me.  - zolmitriptan (ZOMIG) 5 MG tablet; Take 1 tablet (5 mg total) by mouth as needed for migraine.  Dispense: 10 tablet; Refill: 2 - metoCLOPramide (REGLAN) 5 MG tablet; Take 1 tablet (5 mg total) by mouth every 8 (eight) hours as needed for nausea.  Dispense: 15 tablet; Refill: 0   Return in about 2 days (around 11/29/2022), or if symptoms worsen or fail to improve.   Loyola Mast, MD

## 2022-12-03 ENCOUNTER — Telehealth: Payer: Self-pay

## 2022-12-03 NOTE — Telephone Encounter (Signed)
PA for Zolmitriptan 5 mg submitted and approved.  Dm/cma  PA Case: [id number], Status: Approved, Coverage Starts on: 12/03/2022 12:00:00 AM, Coverage Ends on: 12/03/2023 12:00:00 AM.  Key# [id number]

## 2022-12-03 NOTE — Telephone Encounter (Signed)
PA approved from 11/3022 - 12/03/2023.  Pharmacy notified [place] phone.  Dm/cma

## 2022-12-12 ENCOUNTER — Encounter (HOSPITAL_COMMUNITY): Payer: Self-pay | Admitting: *Deleted

## 2023-01-18 ENCOUNTER — Other Ambulatory Visit: Payer: Self-pay | Admitting: Nurse Practitioner

## 2023-01-21 NOTE — Telephone Encounter (Signed)
Last OV 11-27-22 Last refill 11-18-22

## 2023-02-03 ENCOUNTER — Other Ambulatory Visit: Payer: Self-pay | Admitting: Nurse Practitioner

## 2023-02-03 DIAGNOSIS — J309 Allergic rhinitis, unspecified: Secondary | ICD-10-CM

## 2023-02-04 MED ORDER — FLUTICASONE PROPIONATE 50 MCG/ACT NA SUSP: NASAL | 3 refills | Status: AC

## 2023-02-04 NOTE — Addendum Note (Signed)
Addended by: Haydee Salter on: 02/04/2023 04:33 PM   Modules accepted: Orders

## 2023-03-11 ENCOUNTER — Ambulatory Visit: Payer: Medicaid Other | Admitting: Family Medicine

## 2023-03-11 ENCOUNTER — Encounter: Payer: Self-pay | Admitting: Family Medicine

## 2023-03-11 VITALS — BP 164/92 | HR 85 | Temp 98.1°F | Ht 67.0 in | Wt 311.6 lb

## 2023-03-11 DIAGNOSIS — F319 Bipolar disorder, unspecified: Secondary | ICD-10-CM | POA: Insufficient documentation

## 2023-03-11 DIAGNOSIS — I480 Paroxysmal atrial fibrillation: Secondary | ICD-10-CM | POA: Diagnosis not present

## 2023-03-11 DIAGNOSIS — F5089 Other specified eating disorder: Secondary | ICD-10-CM | POA: Diagnosis not present

## 2023-03-11 MED ORDER — METOPROLOL TARTRATE 50 MG PO TABS: 50.0000 mg | ORAL_TABLET | Freq: Two times a day (BID) | ORAL | 3 refills | Status: DC

## 2023-03-11 MED ORDER — PROMETHAZINE HCL 12.5 MG PO TABS
12.5000 mg | ORAL_TABLET | Freq: Three times a day (TID) | ORAL | 0 refills | Status: DC | PRN
Start: 2023-03-11 — End: 2023-06-03

## 2023-03-11 MED ORDER — DULOXETINE HCL 60 MG PO CPEP: 120.0000 mg | ORAL_CAPSULE | Freq: Every day | ORAL | 1 refills | Status: AC

## 2023-03-11 MED ORDER — DIVALPROEX SODIUM ER 250 MG PO TB24: 250.0000 mg | ORAL_TABLET | ORAL | 1 refills | Status: DC

## 2023-03-11 MED ORDER — LATUDA 60 MG PO TABS: 120.0000 mg | ORAL_TABLET | Freq: Every day | ORAL | 1 refills | Status: AC

## 2023-03-11 NOTE — Progress Notes (Signed)
Established Patient Office Visit   Subjective:  Patient ID: Regina Morales, female    DOB: [birth year]  Age: 62 y.o. MRN: [id number]  Chief Complaint  Patient presents with   Diarrhea    Vomiting and diarrhea x 2 days. Also states that she is depressed.     Diarrhea  Associated symptoms include vomiting. Pertinent negatives include no abdominal pain or myalgias.   Encounter Diagnoses  Name Primary?   Bipolar 1 disorder (HCC) Yes   Psychogenic vomiting with nausea    PAF (paroxysmal atrial fibrillation) (HCC)    Ongoing history of bipolar 1 disorder with multiple recent stressors.  This past month is the third year anniversary of her daughter's death.  Her daughter's birthday is the 14th of this month.  She had lost her job and lost coverage of her medications.  She has been out of her psych meds for about a month now.  Her car was repossessed yesterday.  She has a working relationship with Dr. Clide Deutscher.  She has been unable to see her recently.  She admits that she has had thoughts that things would be better off if she were not here and that death could be her only way out.  She denies any plan or intention of self-harm at this time.    Review of Systems  Constitutional: Negative.   HENT: Negative.    Eyes:  Negative for blurred vision, discharge and redness.  Respiratory: Negative.    Cardiovascular: Negative.   Gastrointestinal:  Positive for nausea and vomiting. Negative for abdominal pain.  Genitourinary: Negative.   Musculoskeletal: Negative.  Negative for myalgias.  Skin:  Negative for rash.  Neurological:  Negative for tingling, loss of consciousness and weakness.  Endo/Heme/Allergies:  Negative for polydipsia.  Psychiatric/Behavioral:  Positive for depression. The patient is nervous/anxious.       03/11/2023    3:42 PM 03/11/2023    3:11 PM 02/25/2022    9:31 AM  Depression screen PHQ 2/9  Decreased Interest 3 1 2   Down, Depressed, Hopeless 3 1 2   PHQ - 2 Score  6 2 4   Altered sleeping 3  3  Tired, decreased energy 3  3  Change in appetite 3  3  Feeling bad or failure about yourself  3  2  Trouble concentrating 3  2  Moving slowly or fidgety/restless 3  0  Suicidal thoughts 2  0  PHQ-9 Score 26  17  Difficult doing work/chores Very difficult         Current Outpatient Medications:    acetaminophen (TYLENOL) 500 MG tablet, Take 2 tablets (1,000 mg total) by mouth 3 (three) times daily as needed for mild pain., Disp: 30 tablet, Rfl: 0   promethazine (PHENERGAN) 12.5 MG tablet, Take 1 tablet (12.5 mg total) by mouth every 8 (eight) hours as needed for nausea or vomiting., Disp: 20 tablet, Rfl: 0   albuterol (VENTOLIN HFA) 108 (90 Base) MCG/ACT inhaler, Inhale 2 puffs into the lungs every 6 (six) hours as needed for wheezing or shortness of breath. (Patient not taking: Reported on 03/11/2023), Disp: , Rfl:    alosetron (LOTRONEX) 0.5 MG tablet, Take 0.5 mg by mouth 2 (two) times daily. (Patient not taking: Reported on 03/11/2023), Disp: , Rfl:    apixaban (ELIQUIS) 5 MG TABS tablet, TAKE 1 TABLET BY MOUTH TWICE A DAY (Patient not taking: Reported on 03/11/2023), Disp: 180 tablet, Rfl: 3   divalproex (DEPAKOTE ER) 250 MG 24 hr tablet,  Take 1 tablet (250 mg total) by mouth See admin instructions. Take with 500 mg for a total on 750 mg in the eveningTake 250 mg by mouth See admin instructions. Take with 500 mg for a total on 750 mg in the evening, Disp: 90 tablet, Rfl: 1   dofetilide (TIKOSYN) 500 MCG capsule, TAKE 1 CAPSULE(500 MCG) BY MOUTH TWICE DAILY (Patient not taking: Reported on 03/11/2023), Disp: 180 capsule, Rfl: 3   doxycycline (VIBRA-TABS) 100 MG tablet, Take 1 tablet (100 mg total) by mouth 2 (two) times daily. (Patient not taking: Reported on 03/11/2023), Disp: 20 tablet, Rfl: 0   DULoxetine (CYMBALTA) 60 MG capsule, Take 2 capsules (120 mg total) by mouth daily., Disp: 60 capsule, Rfl: 1   fluticasone (FLONASE) 50 MCG/ACT nasal spray, SPRAY 2 SPRAYS  INTO EACH NOSTRIL EVERY DAY (Patient not taking: Reported on 03/11/2023), Disp: 48 mL, Rfl: 3   gabapentin (NEURONTIN) 100 MG capsule, SMARTSIG:2 Capsule(s) By Mouth Every Evening (Patient not taking: Reported on 03/11/2023), Disp: , Rfl:    guaiFENesin (MUCINEX) 600 MG 12 hr tablet, Take 1 tablet (600 mg total) by mouth 2 (two) times daily as needed for cough or to loosen phlegm. (Patient not taking: Reported on 03/11/2023), Disp: 30 tablet, Rfl: 0   LATUDA 60 MG TABS, Take 2 tablets (120 mg total) by mouth at bedtime., Disp: 30 tablet, Rfl: 1   metoCLOPramide (REGLAN) 5 MG tablet, Take 1 tablet (5 mg total) by mouth every 8 (eight) hours as needed for nausea. (Patient not taking: Reported on 03/11/2023), Disp: 15 tablet, Rfl: 0   metoprolol tartrate (LOPRESSOR) 50 MG tablet, Take 1 tablet (50 mg total) by mouth 2 (two) times daily., Disp: 180 tablet, Rfl: 3   mirabegron ER (MYRBETRIQ) 50 MG TB24 tablet, Take 50 mg by mouth daily. (Patient not taking: Reported on 03/11/2023), Disp: , Rfl:    potassium chloride (KLOR-CON) 10 MEQ tablet, Take 1 tablet (10 mEq total) by mouth daily. (Patient not taking: Reported on 03/11/2023), Disp: 90 tablet, Rfl: 1   topiramate (TOPAMAX) 50 MG tablet, Take 50 mg by mouth 2 (two) times daily. (Patient not taking: Reported on 03/11/2023), Disp: , Rfl:    zolmitriptan (ZOMIG) 5 MG tablet, Take 1 tablet (5 mg total) by mouth as needed for migraine. (Patient not taking: Reported on 03/11/2023), Disp: 10 tablet, Rfl: 2   Objective:     BP (!) 164/92 (BP Location: Right Arm, Patient Position: Sitting, Cuff Size: Large)   Pulse 85   Temp 98.1 F (36.7 C) (Temporal)   Ht 5\' 7"  (1.702 m)   Wt (!) 311 lb 9.6 oz (141.3 kg)   SpO2 96%   BMI 48.80 kg/m    Physical Exam   No results found for any visits on 03/11/23.    The 10-year ASCVD risk score (Arnett DK, et al., 2019) is: 8%    Assessment & Plan:   Bipolar 1 disorder (HCC) -     Divalproex Sodium ER; Take 1 tablet (250  mg total) by mouth See admin instructions. Take with 500 mg for a total on 750 mg in the eveningTake 250 mg by mouth See admin instructions. Take with 500 mg for a total on 750 mg in the evening  Dispense: 90 tablet; Refill: 1 -     DULoxetine HCl; Take 2 capsules (120 mg total) by mouth daily.  Dispense: 60 capsule; Refill: 1 -     Latuda; Take 2 tablets (120 mg total) by  mouth at bedtime.  Dispense: 30 tablet; Refill: 1 -     Ambulatory referral to Psychiatry  Psychogenic vomiting with nausea -     Promethazine HCl; Take 1 tablet (12.5 mg total) by mouth every 8 (eight) hours as needed for nausea or vomiting.  Dispense: 20 tablet; Refill: 0  PAF (paroxysmal atrial fibrillation) (HCC) -     Metoprolol Tartrate; Take 1 tablet (50 mg total) by mouth 2 (two) times daily.  Dispense: 180 tablet; Refill: 3    Return To Behavior Health if worse.Marland Kitchen  Refilling all of her psych meds for a month with 1 refill.  She understands that I will not be able to follow her for this illness.  She will need to be followed by a psychiatrist.  Mliss Sax, MD

## 2023-03-15 ENCOUNTER — Emergency Department (HOSPITAL_BASED_OUTPATIENT_CLINIC_OR_DEPARTMENT_OTHER)
Admission: EM | Admit: 2023-03-15 | Discharge: 2023-03-16 | Disposition: A | Discharge status: Home / Self Care | Payer: Medicaid Other | Attending: Emergency Medicine | Admitting: Emergency Medicine

## 2023-03-15 ENCOUNTER — Encounter (HOSPITAL_BASED_OUTPATIENT_CLINIC_OR_DEPARTMENT_OTHER): Payer: Self-pay

## 2023-03-15 ENCOUNTER — Emergency Department (HOSPITAL_BASED_OUTPATIENT_CLINIC_OR_DEPARTMENT_OTHER): Payer: Medicaid Other

## 2023-03-15 ENCOUNTER — Other Ambulatory Visit: Payer: Self-pay

## 2023-03-15 DIAGNOSIS — Z7901 Long term (current) use of anticoagulants: Secondary | ICD-10-CM | POA: Diagnosis not present

## 2023-03-15 DIAGNOSIS — R251 Tremor, unspecified: Secondary | ICD-10-CM | POA: Diagnosis not present

## 2023-03-15 DIAGNOSIS — R55 Syncope and collapse: Secondary | ICD-10-CM | POA: Diagnosis present

## 2023-03-15 DIAGNOSIS — M25562 Pain in left knee: Secondary | ICD-10-CM | POA: Insufficient documentation

## 2023-03-15 DIAGNOSIS — J449 Chronic obstructive pulmonary disease, unspecified: Secondary | ICD-10-CM | POA: Insufficient documentation

## 2023-03-15 LAB — CBC
HCT: 37.7 % (ref 36.0–46.0)
Hemoglobin: 12.1 g/dL (ref 12.0–15.0)
MCH: 27.7 pg (ref 26.0–34.0)
MCHC: 32.1 g/dL (ref 30.0–36.0)
MCV: 86.3 fL (ref 80.0–100.0)
Platelets: 311 10*3/uL (ref 150–400)
RBC: 4.37 MIL/uL (ref 3.87–5.11)
RDW: 13.5 % (ref 11.5–15.5)
WBC: 11.1 10*3/uL — ABNORMAL HIGH (ref 4.0–10.5)
nRBC: 0 % (ref 0.0–0.2)

## 2023-03-15 LAB — BASIC METABOLIC PANEL
Anion gap: 10 (ref 5–15)
BUN: 13 mg/dL (ref 6–20)
CO2: 24 mmol/L (ref 22–32)
Calcium: 8.6 mg/dL — ABNORMAL LOW (ref 8.9–10.3)
Chloride: 101 mmol/L (ref 98–111)
Creatinine, Ser: 0.81 mg/dL (ref 0.44–1.00)
GFR, Estimated: >60 mL/min (ref >60)
Glucose, Bld: 100 mg/dL — ABNORMAL HIGH (ref 70–99)
Potassium: 3.7 mmol/L (ref 3.5–5.1)
Sodium: 135 mmol/L (ref 135–145)

## 2023-03-15 MED ORDER — IOHEXOL 350 MG/ML SOLN
80.0000 mL | Freq: Once | INTRAVENOUS | Status: AC | PRN
  Administered 2023-03-15: 80 mL via INTRAVENOUS

## 2023-03-15 NOTE — ED Notes (Signed)
Blood glucose with EMS 82

## 2023-03-15 NOTE — ED Provider Notes (Signed)
[place] [facility] Provider Note   CSN: [id number] Arrival date & time: 03/15/23  1635     History  Chief Complaint  Patient presents with   Loss of Consciousness    Regina Morales is a 62 y.o. female.  The history is provided by the patient and medical records. No language interpreter was used.  Loss of Consciousness Episode history:  Single Most recent episode:  Today Duration:  3 seconds Timing:  Unable to specify Progression:  Resolved Chronicity:  New Witnessed: yes   Relieved by:  Nothing Worsened by:  Nothing Ineffective treatments:  None tried Associated symptoms: no chest pain, no confusion, no difficulty breathing, no fever, no headaches, no malaise/fatigue, no nausea, no palpitations, no shortness of breath, no vomiting and no weakness  Seizures: no known seizures.      Home Medications Prior to Admission medications   Medication Sig Start Date End Date Taking? Authorizing Provider  acetaminophen (TYLENOL) 500 MG tablet Take 2 tablets (1,000 mg total) by mouth 3 (three) times daily as needed for mild pain. 09/16/21   Fritzi Mandes, MD  albuterol (VENTOLIN HFA) 108 (90 Base) MCG/ACT inhaler Inhale 2 puffs into the lungs every 6 (six) hours as needed for wheezing or shortness of breath. Patient not taking: Reported on 03/11/2023    [provider]  alosetron (LOTRONEX) 0.5 MG tablet Take 0.5 mg by mouth 2 (two) times daily. Patient not taking: Reported on 03/11/2023    [provider]  apixaban (ELIQUIS) 5 MG TABS tablet TAKE 1 TABLET BY MOUTH TWICE A DAY Patient not taking: Reported on 03/11/2023 06/12/22   Wendall Stade, MD  divalproex (DEPAKOTE ER) 250 MG 24 hr tablet Take 1 tablet (250 mg total) by mouth See admin instructions. Take with 500 mg for a total on 750 mg in the eveningTake 250 mg by mouth See admin instructions. Take with 500 mg for a total on 750 mg in the evening 03/11/23   Mliss Sax, MD  dofetilide (TIKOSYN) 500 MCG capsule TAKE 1 CAPSULE(500 MCG) BY MOUTH TWICE DAILY Patient not taking: Reported on 03/11/2023 06/06/22   Wendall Stade, MD  doxycycline (VIBRA-TABS) 100 MG tablet Take 1 tablet (100 mg total) by mouth 2 (two) times daily. Patient not taking: Reported on 03/11/2023 08/09/22   Gerre Scull, NP  DULoxetine (CYMBALTA) 60 MG capsule Take 2 capsules (120 mg total) by mouth daily. 03/11/23   Mliss Sax, MD  fluticasone [facility]) 50 MCG/ACT nasal spray SPRAY 2 SPRAYS INTO EACH NOSTRIL EVERY DAY Patient not taking: Reported on 03/11/2023 02/04/23   Loyola Mast, MD  gabapentin (NEURONTIN) 100 MG capsule SMARTSIG:2 Capsule(s) By Mouth Every Evening Patient not taking: Reported on 03/11/2023 02/16/22   [provider]  guaiFENesin (MUCINEX) 600 MG 12 hr tablet Take 1 tablet (600 mg total) by mouth 2 (two) times daily as needed for cough or to loosen phlegm. Patient not taking: Reported on 03/11/2023 08/09/22   McElwee, Lauren A, NP  LATUDA 60 MG TABS Take 2 tablets (120 mg total) by mouth at bedtime. 03/11/23   Mliss Sax, MD  metoCLOPramide (REGLAN) 5 MG tablet Take 1 tablet (5 mg total) by mouth every 8 (eight) hours as needed for nausea. Patient not taking: Reported on 03/11/2023 11/27/22   Loyola Mast, MD  metoprolol tartrate (LOPRESSOR) 50 MG tablet Take 1 tablet (50 mg total) by mouth 2 (two) times daily.  03/11/23   Mliss Sax, MD  mirabegron ER (MYRBETRIQ) 50 MG TB24 tablet Take 50 mg by mouth daily. Patient not taking: Reported on 03/11/2023    [provider]  potassium chloride (KLOR-CON) 10 MEQ tablet Take 1 tablet (10 mEq total) by mouth daily. Patient not taking: Reported on 03/11/2023 06/11/22 09/09/22  Sheilah Pigeon, PA-C  promethazine (PHENERGAN) 12.5 MG tablet Take 1 tablet (12.5 mg total) by mouth every 8 (eight) hours as needed for nausea or vomiting. 03/11/23   Mliss Sax, MD  topiramate  (TOPAMAX) 50 MG tablet Take 50 mg by mouth 2 (two) times daily. Patient not taking: Reported on 03/11/2023 08/31/20   [provider]  zolmitriptan (ZOMIG) 5 MG tablet Take 1 tablet (5 mg total) by mouth as needed for migraine. Patient not taking: Reported on 03/11/2023 11/27/22   Loyola Mast, MD      Allergies    Codeine    Review of Systems   Review of Systems  Constitutional:  Negative for chills, fatigue, fever and malaise/fatigue.  HENT:  Negative for congestion.   Eyes:  Negative for visual disturbance.  Respiratory:  Negative for cough, chest tightness, shortness of breath and wheezing.   Cardiovascular:  Positive for syncope. Negative for chest pain and palpitations.  Gastrointestinal:  Negative for abdominal pain, constipation, diarrhea, nausea and vomiting.  Genitourinary:  Negative for dysuria and flank pain.  Musculoskeletal:  Negative for back pain, neck pain and neck stiffness.  Skin:  Negative for rash and wound.  Neurological:  Positive for tremors. Negative for facial asymmetry, weakness, light-headedness and headaches. Seizures: no known seizures. Psychiatric/Behavioral:  Negative for agitation and confusion.   All other systems reviewed and are negative.   Physical Exam Updated Vital Signs BP 137/89 (BP Location: Left Arm)   Pulse 71   Temp 98.9 F (37.2 C) (Oral)   Resp 18   Ht 5\' 7"  (1.702 m)   Wt (!) 141.1 kg   SpO2 97%   BMI 48.71 kg/m  Physical Exam Vitals and nursing note reviewed.  Constitutional:      General: She is not in acute distress.    Appearance: She is well-developed. She is not ill-appearing, toxic-appearing or diaphoretic.  HENT:     Head: Normocephalic and atraumatic.     Nose: No congestion or rhinorrhea.     Mouth/Throat:     Mouth: Mucous membranes are moist.     Pharynx: No oropharyngeal exudate or posterior oropharyngeal erythema.  Eyes:     Extraocular Movements: Extraocular movements intact.      Conjunctiva/sclera: Conjunctivae normal.  Neck:     Vascular: No carotid bruit.  Cardiovascular:     Rate and Rhythm: Normal rate and regular rhythm.     Heart sounds: No murmur heard. Pulmonary:     Effort: Pulmonary effort is normal. No respiratory distress.     Breath sounds: Normal breath sounds. No wheezing, rhonchi or rales.  Chest:     Chest wall: No tenderness.  Abdominal:     General: Abdomen is flat.     Palpations: Abdomen is soft.     Tenderness: There is no abdominal tenderness. There is no right CVA tenderness, left CVA tenderness, guarding or rebound.  Musculoskeletal:        General: No swelling or tenderness.     Cervical back: Neck supple. No tenderness.  Skin:    General: Skin is warm and dry.     Capillary Refill:  Capillary refill takes less than 2 seconds.     Findings: No erythema or rash.  Neurological:     General: No focal deficit present.     Mental Status: She is alert.     Cranial Nerves: No cranial nerve deficit.     Sensory: No sensory deficit.     Motor: No weakness.     Coordination: Coordination normal.  Psychiatric:        Mood and Affect: Mood normal.     ED Results / Procedures / Treatments   Labs (all labs ordered are listed, but only abnormal results are displayed) Labs Reviewed  BASIC METABOLIC PANEL - Abnormal; Notable for the following components:      Result Value   Glucose, Bld 100 (*)    Calcium 8.6 (*)    All other components within normal limits  CBC - Abnormal; Notable for the following components:   WBC 11.1 (*)    All other components within normal limits  URINALYSIS, ROUTINE W REFLEX MICROSCOPIC    EKG EKG Interpretation  Date/Time:  Saturday Mar 15 2023 16:48:08 EDT Ventricular Rate:  75 PR Interval:    QRS Duration: 97 QT Interval:  403 QTC Calculation: 451 R Axis:   -32 Text Interpretation: sinus rhythm Abnormal R-wave progression, late transition Inferior infarct, old when comapred top rior, similar  appearance.  No STEMI Confirmed by Theda Belfast ([id number]) on 03/15/2023 4:56:02 PM  Radiology CT ANGIO HEAD NECK W WO CM  Result Date: 03/15/2023 CLINICAL DATA:  Syncope EXAM: CT ANGIOGRAPHY HEAD AND NECK WITH AND WITHOUT CONTRAST TECHNIQUE: Multidetector CT imaging of the head and neck was performed using the standard protocol during bolus administration of intravenous contrast. Multiplanar CT image reconstructions and MIPs were obtained to evaluate the vascular anatomy. Carotid stenosis measurements (when applicable) are obtained utilizing NASCET criteria, using the distal internal carotid diameter as the denominator. RADIATION DOSE REDUCTION: This exam was performed according to the departmental dose-optimization program which includes automated exposure control, adjustment of the mA and/or kV according to patient size and/or use of iterative reconstruction technique. CONTRAST:  80mL OMNIPAQUE IOHEXOL 350 MG/ML SOLN COMPARISON:  None Available. FINDINGS: CT HEAD FINDINGS Brain: There is no mass, hemorrhage or extra-axial collection. There is generalized atrophy without lobar predilection. There is no acute or chronic infarction. The brain parenchyma is normal. Skull: The visualized skull base, calvarium and extracranial soft tissues are normal. Sinuses/Orbits: No fluid levels or advanced mucosal thickening of the visualized paranasal sinuses. No mastoid or middle ear effusion. The orbits are normal. CTA NECK FINDINGS SKELETON: There is no bony spinal canal stenosis. No lytic or blastic lesion. OTHER NECK: Normal pharynx, larynx and major salivary glands. No cervical lymphadenopathy. Unremarkable thyroid gland. UPPER CHEST: No pneumothorax or pleural effusion. No nodules or masses. AORTIC ARCH: There is no calcific atherosclerosis of the aortic arch. There is no aneurysm, dissection or hemodynamically significant stenosis of the visualized portion of the aorta. Normal variant aortic arch branching pattern  with the left vertebral artery arising independently from the aortic arch. The visualized proximal subclavian arteries are widely patent. RIGHT CAROTID SYSTEM: Normal without aneurysm, dissection or stenosis. LEFT CAROTID SYSTEM: Normal without aneurysm, dissection or stenosis. VERTEBRAL ARTERIES: Left dominant configuration. Both origins are clearly patent. There is no dissection, occlusion or flow-limiting stenosis to the skull base (V1-V3 segments). CTA HEAD FINDINGS POSTERIOR CIRCULATION: --Vertebral arteries: Normal V4 segments. --Inferior cerebellar arteries: Normal. --Basilar artery: Normal. --Superior cerebellar arteries: Normal. --Posterior cerebral arteries (  PCA): Normal. ANTERIOR CIRCULATION: --Intracranial internal carotid arteries: Normal. --Anterior cerebral arteries (ACA): Normal. Both A1 segments are present. Patent anterior communicating artery (a-comm). --Middle cerebral arteries (MCA): Normal. VENOUS SINUSES: As permitted by contrast timing, patent. ANATOMIC VARIANTS: None Review of the MIP images confirms the above findings. IMPRESSION: 1. No emergent large vessel occlusion or hemodynamically significant stenosis of the head or neck. 2. Cerebral volume loss. Electronically Signed   By: Deatra Robinson M.D.   On: 03/15/2023 22:44    Procedures Procedures    Medications Ordered in ED Medications  iohexol (OMNIPAQUE) 350 MG/ML injection 80 mL (80 mLs Intravenous Contrast Given 03/15/23 2222)    ED Course/ Medical Decision Making/ A&P                             Medical Decision Making Amount and/or Complexity of Data Reviewed Labs: ordered. Radiology: ordered.  Risk Prescription drug management.    Shirlena A Morales is a 62 y.o. female with a past medical history significant for paroxysmal atrial fibrillation not on anticoagulation currently, COPD, sleep apnea, migraines, GERD, hepatic steatosis, hypercholesterolemia, previous pulmonary embolism/DVT, and obesity who presents  with syncope and shaking episode.  According to patient, around 345 this afternoon she was walking in a break room at work when she started having shaking with her right arm and started spilling her drink.  She then started having unsteadiness and shaking in her legs and then started blacking out.  She reports hitting her head and going to the ground.  She reports she was out for several seconds before starting to wake up.  She is having no nausea or vomiting but had some headache initially.  No reported speech or vision changes.  She reports that she has had some shaking in the past but never like this.  She denies any history of seizures and has no history of strokes.  She denies any abdominal pain or chest pain but does have some pain in her left knee.  On exam, lungs clear and chest nontender.  Abdomen nontender.  Some tenderness on her right forehead but pupils are symmetric and reactive with normal extract movements.  No evidence of entrapment.  No lacerations.  Tenderness present on the left knee with a slight abrasion.  Minimal pain with bending the knee but tenderness anteriorly.  Intact pulses in lower extremities.  Intact sensation and strength otherwise.  She did not have any focal shaking for me and exam was otherwise unremarkable.  I called and spoke to neurology given the focal shaking leading to more diffuse shaking leading to syncope.  It is unclear if this was seizure or syncope but Dr. Bevelyn Ngo did not with neurology recommended CT a head and neck here followed by ED to ED transfer for MRI brain without contrast and routine EEG in the emergency department to help determine disposition.  Will also get x-ray of her left knee and screening labs.  Care transferred to oncoming team to await transfer to [facility] to get the complete the workup for her shaking episode and syncope.  Anticipate discharge if workup is reassuring.  Care transferred to [facility] emergency department for EEG and MRI.   ED to ED transfer accepted by Dr. Eudelia Bunch.         Final Clinical Impression(s) / ED Diagnoses Final diagnoses:  Syncope, unspecified syncope type  Episode of shaking  Acute pain of left knee    Clinical  Impression: 1. Syncope, unspecified syncope type   2. Episode of shaking   3. Acute pain of left knee     Disposition: ED to ED transfer to get MRI and EEG as requested by neurology.  This note was prepared with assistance of Conservation officer, historic buildings. Occasional wrong-word or sound-a-like substitutions may have occurred due to the inherent limitations of voice recognition software.      Lamont Tant, Canary Brim, MD 03/15/23 [phone]

## 2023-03-15 NOTE — ED Triage Notes (Signed)
Patient was at work and started feeling shaky. She had not had anything to eat today. She had a witnessed syncopal episode. Patient having left knee pain.

## 2023-03-15 NOTE — ED Notes (Signed)
Pt placed on cardiac monitoring.  

## 2023-03-16 ENCOUNTER — Other Ambulatory Visit: Payer: Self-pay

## 2023-03-16 ENCOUNTER — Encounter (HOSPITAL_COMMUNITY): Payer: Self-pay

## 2023-03-16 ENCOUNTER — Emergency Department (HOSPITAL_COMMUNITY): Payer: Medicaid Other

## 2023-03-16 ENCOUNTER — Telehealth: Payer: Self-pay

## 2023-03-16 DIAGNOSIS — R251 Tremor, unspecified: Secondary | ICD-10-CM | POA: Diagnosis not present

## 2023-03-16 DIAGNOSIS — R55 Syncope and collapse: Secondary | ICD-10-CM | POA: Diagnosis present

## 2023-03-16 DIAGNOSIS — M25562 Pain in left knee: Secondary | ICD-10-CM | POA: Diagnosis not present

## 2023-03-16 DIAGNOSIS — Z7901 Long term (current) use of anticoagulants: Secondary | ICD-10-CM | POA: Diagnosis not present

## 2023-03-16 DIAGNOSIS — J449 Chronic obstructive pulmonary disease, unspecified: Secondary | ICD-10-CM | POA: Diagnosis not present

## 2023-03-16 LAB — URINALYSIS, ROUTINE W REFLEX MICROSCOPIC
Bilirubin Urine: NEGATIVE
Glucose, UA: NEGATIVE mg/dL
Hgb urine dipstick: NEGATIVE
Ketones, ur: NEGATIVE mg/dL
Leukocytes,Ua: NEGATIVE
Nitrite: NEGATIVE
Protein, ur: NEGATIVE mg/dL
Specific Gravity, Urine: 1.02 (ref 1.005–1.030)
pH: 5.5 (ref 5.0–8.0)

## 2023-03-16 MED ORDER — IBUPROFEN 400 MG PO TABS
400.0000 mg | ORAL_TABLET | Freq: Once | ORAL | Status: AC
  Administered 2023-03-16: 400 mg via ORAL
  Filled 2023-03-16: qty 1

## 2023-03-16 MED ORDER — ACETAMINOPHEN 500 MG PO TABS
1000.0000 mg | ORAL_TABLET | Freq: Once | ORAL | Status: AC
  Administered 2023-03-16: 1000 mg via ORAL
  Filled 2023-03-16: qty 2

## 2023-03-16 NOTE — ED Notes (Signed)
[facility] called for transport. 

## 2023-03-16 NOTE — ED Notes (Signed)
This RN reviewed discharge instructions with patient. She verbalized understanding and denied any further questions. PT well appearing upon discharge and reports tolerable pain. Pt wheeled to exit. Pt endorses ride home.  

## 2023-03-16 NOTE — Discharge Instructions (Signed)
The MRI and EEG looked normal today.  The passing out may be due to your heart rhythm or the palpitations.  Follow up with cardiology.  Continue current meds.  Return if happens again.  Follow up with ortho if you continue to have knee pain.  Ice and elevate in the meantime and wear the brace for comfort.  Take tylenol for pain.

## 2023-03-16 NOTE — ED Notes (Signed)
NAD noted, respirations are equal bilaterally and unlabored at this time. Pt resting in gurney and denies any unmet needs. Pt connected to CCM, pulseox & BP. Call light within reach. 

## 2023-03-16 NOTE — Telephone Encounter (Signed)
Incorrect chart disregard call

## 2023-03-16 NOTE — Progress Notes (Signed)
EEG complete - results pending.  GMD/NW 

## 2023-03-16 NOTE — ED Triage Notes (Signed)
Patient was at work at around [employer] and started feeling shaky. She had not had anything to eat. She had a witnessed syncopal episode. Patient having left knee pain. Knee xray and t neg. Pt was sent here from [facility] for MRI. ALL SYMPTOMS HAVE RESOLVED

## 2023-03-16 NOTE — ED Notes (Signed)
EEG at bedside.

## 2023-03-16 NOTE — Procedures (Signed)
Routine EEG Report  Regina Morales is a 62 y.o. female with a history of altered mental status who is undergoing an EEG to evaluate for seizures.  Report: This EEG was acquired with electrodes placed according to the International 10-20 electrode system (including Fp1, Fp2, F3, F4, C3, C4, P3, P4, O1, O2, T3, T4, T5, T6, A1, A2, Fz, Cz, Pz). The following electrodes were missing or displaced: none.  The occipital dominant rhythm was 9 Hz. This activity is reactive to stimulation. Drowsiness was manifested by background fragmentation; deeper stages of sleep were identified by K complexes and sleep spindles. There was no focal slowing. There were no interictal epileptiform discharges. There were no electrographic seizures identified. There was no abnormal response to photic stimulation or hyperventilation.   Impression: This EEG was obtained while awake and asleep and is normal.    Clinical Correlation: Normal EEGs, however, do not rule out epilepsy.  Bing Neighbors, MD [facility] [phone]  If 7pm- 7am, please page neurology on call as listed in AMION.

## 2023-03-16 NOTE — ED Notes (Signed)
Provider at bedside

## 2023-03-16 NOTE — ED Notes (Addendum)
NAD noted, respirations are equal bilaterally and unlabored at this time. Pt resting in gurney and denies any unmet needs. Pt connected to pulseox & BP. Call light within reach. 

## 2023-03-16 NOTE — Care Management (Addendum)
Walker ordered for patient via adapt. Patient has Insurance, therefore is ineligible for [facility] medication assistance. Team aware The walker is needed as the patients knee is in an immobilizer and is too weak for crutches. ICD-10 M25.562 knee pain

## 2023-03-16 NOTE — ED Notes (Signed)
Ortho tech at bedside 

## 2023-03-16 NOTE — ED Provider Notes (Signed)
Assumed care from Dr. Oletta Cohn at 7 AM.  Patient transferred from [facility] after having a brief episode of loss of consciousness that caused her to fall.  There was concern that patient could have had a seizure or strokelike event.  She was transferred to [facility] for MRI and EEG.  Dr. Foye Clock spoke with Dr. Randal Buba with neurology who recommended if the MRI and EEG are normal patient is stable for discharge.  Patient's MRI without acute findings, CTA and CT done at [facility] were normal.  On evaluation when discussing this with the patient she is complaining of pain in her left knee that occurred with the fall.  She does not normally have knee pain but reports it is very painful when she tries to bend it.  X-ray of her left knee was neg and give pain medication and placed a knee sleeve.  She has have bruising to the anterior portion of the patella.  No ankle or hip pain present.   EEG is pending. 1:07 PM EEG wnl.  Speaking with the patient she had been on Tikosyn and a month ago that was discontinued because her insurance would no longer pay for it.  She has had intermittent palpitations at home but did not call her cardiologist to let them know this.  Here patient has been in a sinus rhythm but question whether her event was related to tachycardia or dysrhythmia.  This was discussed with the patient.  She will need cardiology follow-up.  Also patient will need ortho f/u if she continues to have knee pain.  She was placed in a knee brace and given a walker    Gwyneth Sprout, MD 03/16/23 1326

## 2023-03-16 NOTE — Progress Notes (Signed)
Per rn pt just went to MRI, will check back

## 2023-03-16 NOTE — Progress Notes (Signed)
Orthopedic Tech Progress Note Patient Details:  Regina Morales 1961-01-17 [id number]  Ortho Devices Type of Ortho Device: Knee splint (hinged neoprene brace) Ortho Device/Splint Location: LLE Ortho Device/Splint Interventions: Ordered, Application, Adjustment   Post Interventions Patient Tolerated: Well Instructions Provided: Care of device, Adjustment of device  Regina Morales Regina Morales 03/16/2023, 3:22 PM

## 2023-03-16 NOTE — Progress Notes (Signed)
Late patient transfer from [facility]. EEG will be placed on day shift.

## 2023-03-16 NOTE — ED Notes (Signed)
Pt to MRI. NAD noted at time of transport.

## 2023-03-16 NOTE — Progress Notes (Signed)
Went to room and pt is still in MRI, set up machine and will check back again later

## 2023-03-17 ENCOUNTER — Telehealth: Payer: Self-pay

## 2023-03-17 NOTE — Transitions of Care (Post Inpatient/ED Visit) (Signed)
   03/17/2023  Name: Regina Morales MRN: [id number] DOB: 05/22/1961  Today's TOC FU Call Status: Today's TOC FU Call Status:: Unsuccessul Call (1st Attempt) Unsuccessful Call (1st Attempt) Date: 03/17/23  Attempted to reach the patient regarding the most recent Inpatient/ED visit.  Follow Up Plan: Additional outreach attempts will be made to reach the patient to complete the Transitions of Care (Post Inpatient/ED visit) call.   Signature Arvil Persons, BSN, Charity fundraiser

## 2023-03-17 NOTE — Telephone Encounter (Signed)
Pt returned your call.  

## 2023-03-20 ENCOUNTER — Other Ambulatory Visit: Payer: Self-pay | Admitting: Family Medicine

## 2023-03-20 DIAGNOSIS — S76112A Strain of left quadriceps muscle, fascia and tendon, initial encounter: Secondary | ICD-10-CM

## 2023-03-20 DIAGNOSIS — R112 Nausea with vomiting, unspecified: Secondary | ICD-10-CM

## 2023-04-02 ENCOUNTER — Other Ambulatory Visit: Payer: Self-pay | Admitting: Family Medicine

## 2023-04-02 DIAGNOSIS — F319 Bipolar disorder, unspecified: Secondary | ICD-10-CM

## 2023-04-02 NOTE — Transitions of Care (Post Inpatient/ED Visit) (Signed)
   04/02/2023  Name: Regina Morales MRN: [id number] DOB: [birth year]  Today's TOC FU Call Status: Today's TOC FU Call Status:: Unsuccessful Call (3rd Attempt) Unsuccessful Call (1st Attempt) Date: 03/17/23 Unsuccessful Call (3rd Attempt) Date: 04/02/23  Attempted to reach the patient regarding the most recent Inpatient/ED visit.  Follow Up Plan: No further outreach attempts will be made at this time. We have been unable to contact the patient.  Signature  Arvil Persons, BSN, Charity fundraiser

## 2023-04-03 NOTE — Transitions of Care (Post Inpatient/ED Visit) (Signed)
   04/03/2023  Name: Regina Morales MRN: [id number] DOB: 05/06/1961  Today's TOC FU Call Status: Today's TOC FU Call Status:: Unsuccessful Call (3rd Attempt) Unsuccessful Call (1st Attempt) Date: 03/17/23 Unsuccessful Call (3rd Attempt) Date: 04/03/23  Attempted to reach the patient regarding the most recent Inpatient/ED visit.  Follow Up Plan: No further outreach attempts will be made at this time. We have been unable to contact the patient.  Signature Arvil Persons, BSN, Charity fundraiser

## 2023-04-07 ENCOUNTER — Telehealth: Payer: Self-pay | Admitting: *Deleted

## 2023-04-07 NOTE — Telephone Encounter (Signed)
   Pre-operative Risk Assessment    Patient Name: Regina Morales  DOB: Apr 26, 1961 MRN: [id number]      Request for Surgical Clearance    Procedure:   COLONOSCOPY  Date of Surgery:  Clearance 05/01/23                                 Surgeon:  DR. HUNG Surgeon's Group or Practice Name:  [facility] Phone number:  [phone] Fax number:  [phone]   Type of Clearance Requested:   - Medical ; NO MEDICATIONS LISTED AS NEEDING TO BE HELD; PT'S MED LIST REFLECTS ELIQUIS, THOUGH THERE IS A NOT STATING NOT TAKING REPORTED 03/11/23   Type of Anesthesia:   PROPOFOL   Additional requests/questions:    Elpidio Anis   04/07/2023, 1:55 PM

## 2023-04-08 DIAGNOSIS — F411 Generalized anxiety disorder: Secondary | ICD-10-CM | POA: Insufficient documentation

## 2023-04-08 DIAGNOSIS — F3181 Bipolar II disorder: Secondary | ICD-10-CM | POA: Insufficient documentation

## 2023-04-08 NOTE — Telephone Encounter (Signed)
Patient already has a scheduled appointment with Francis Dowse, PA-C on  6/20 at 2:45 pm. Will route recommendations to requesting provider's office.

## 2023-04-08 NOTE — Telephone Encounter (Signed)
   Name: Regina Morales  DOB: October 07, 1961  MRN: [id number]  Primary Cardiologist: [name], MD  Chart reviewed as part of pre-operative protocol coverage. Because of Regina Morales's past medical history and time since last visit, she will require a follow-up in-office visit in order to better assess preoperative cardiovascular risk. She is scheduled with Francis Dowse, PA-C on 04/24/2023. Appointment notes have been updated to reflect need for preoperative risk assessment.   Pre-op covering staff:  - Please contact requesting surgeon's office via preferred method (i.e, phone, fax) to inform them of need for appointment prior to surgery.  This message was not routed to pharmacy pool as it is noted in the system as of 03/11/2023, patient is not taking Eliquis.   Carlos Levering, NP  04/08/2023, 10:40 AM

## 2023-04-23 NOTE — Progress Notes (Deleted)
Cardiology Office Note Date:  04/23/2023  Patient ID:  Regina Morales, DOB Apr 02, 1961, MRN [id number] PCP:  Loyola Mast, MD  Cardiologist:  Dr. Eden Emms Electrophysiologist: Dr. Elberta Fortis    Chief Complaint:   pre-op/ER visit/syncope  History of Present Illness: Regina Morales is a 62 y.o. female with history of HTN, OSA w/CPAP, Afib, DVT/PE (w/pregnancy), GERD, fibromyalgia.  She comes in today to be seen for Dr. Elberta Fortis, last seen by her 03/15/21, arhythmia burden down, sadly still grieving loss of her daughter, working with her MD, the cymbalta helping her depression though causing a tremor. QTc was stable, discussed importance when discussing her medicines/management with her PMD/psychiatrist that they are aware of her Tikosyn.  She had elective lap-chole 09/14/21 discharged 09/17/21 to resume her Eliquis 3 days post-op (11/15).  I saw her 09/19/21 She comes today accompanied by her roommate. Cardiac-wise she reports feeling OK No CP, no palpitations She is s/p lap-chole/ERCP and still sore, able to eat some yesterday better Roommate has been urging to her to mobilize The patient states she was called that Dr. Elberta Fortis wanted her to come in, was unclear as to why. She resumed Eliquis yesterday as instructed She reports compliance with medicines She has post op surgery follow up in place Note a tremor, in d/w pt/roommate, following with psychiatry and felt 2/2 medicines, the patient states that psychiatry mentioned a side effect of her medication though perhaps a reasonable comprimise for + benefits of her medicines. Urged ongoing discussions, roommate a bit frustrated says they only ever get video visits, they will continue discussions with her PMD and psychiatry provider We discussed her meds, especially Zofran and re-educated about Tikosyn and meds  Pre-op team addressed pre-op eval for colonoscopy in May.  I saw her 06/10/22 She is doing pretty well. Post lap-chole she  struggled with GI issues and diarrhea, but the lat medication alosetron has done well. Her depakote dose has been reduced and her tremor improved with plans to taper her off all together. She notes by her watch and palpitations AFib with rates towards 130's, these are fairly infrequent and last a minute or so only. Outside of her watch alert and palpitations do not cause any other symptoms. No SOB No CP No near syncope or syncope. No bleeding or signs of bleeding No changes were made Tikosyn teaching re-enforced Planned for 4 mo visit   Last 2 visit scheduled were canceled  ER visit 03/16/23 w/syncope, event described as  she was walking in a break room at work when she started having shaking with her right arm and started spilling her drink. She then started having unsteadiness and shaking in her legs and then started blacking out. She reports hitting her head and going to the ground. She reports she was out for several seconds before starting to wake up. She is having no nausea or vomiting but had some headache initially.  D/w neurology, seemed less convinced of seizure, though did recommend ER w/u MRI without acute findings,  CTA and CT done at [facility] were normal.   EEG wnl Pt reported being off Tikosyn a month unable to afford refills (apparently no longer covered for her) She was in SR in the ER C/o knee pain, braced and planned for ortho f/u  Need pre-op eval for a colonoscopy  *** doesn't sound cardiac *** NO more Tikosyn, compliance *** defer any neuro/med clearance to her PMD *** AFib? Burden? *** monitor *** eliquis, bleeding,  labs, dose *** meds otherwise? Off her depakote?  Neuro/psych   Afib hx Diagnosed 2014 Flecainide failed with recurrent arrhythmia Tikosyn started Nov 2019  Past Medical History:  Diagnosis Date   Anemia    hx years ago   Anxiety    years ago   Buzzing in ear    "right; even after OR"   Chest pain    a. 2009: neg MV   ([facility])   COPD (chronic obstructive pulmonary disease) (HCC) 2018   Depression    DVT (deep vein thrombosis) in pregnancy 1980's   LLE   Fibromyalgia    "dx'd many many years ago; I haven't had any problems w/it" (04/15/2017)   GERD (gastroesophageal reflux disease)    Hepatic steatosis    High cholesterol    History of blood transfusion    "low count after appendix surgery" unsure # of units transfused   Hypertension    Migraines    "maybe once/2 months" (04/15/2017)   Obesity    OSA on CPAP    Persistent atrial fibrillation (HCC)    a. Dx 07/2013;  b. CHA2DS2VASc=1 (female);  c. 07/2013 Echo:  EF 50-55%, no rwma, mod LVH.   Pulmonary embolism (HCC) ~ 1984   "after I had my last child"   SVD (spontaneous vaginal delivery)    x 4    Past Surgical History:  Procedure Laterality Date   APPENDECTOMY  1990's   BREAST CYST ASPIRATION Left 2019   @ [facility]   CHOLECYSTECTOMY N/A 09/14/2021   Procedure: LAPAROSCOPIC CHOLECYSTECTOMY WITH INTRAOPERATIVE CHOLANGIOGRAM;  Surgeon: Fritzi Mandes, MD;  Location: [facility] OR;  Service: General;  Laterality: N/A;   COLONOSCOPY     DILATATION & CURETTAGE/HYSTEROSCOPY WITH MYOSURE N/A 04/14/2017   Procedure: DILATATION & CURETTAGE/HYSTEROSCOPY WITH MYOSURE;  Surgeon: Myna Hidalgo, DO;  Location: [facility] ORS;  Service: Gynecology;  Laterality: N/A;  Polypectomy   DILATION AND CURETTAGE OF UTERUS  1983   [facility]   ERCP N/A 09/15/2021   Procedure: ENDOSCOPIC RETROGRADE CHOLANGIOPANCREATOGRAPHY (ERCP);  Surgeon: Vida Rigger, MD;  Location: [facility] ENDOSCOPY;  Service: Endoscopy;  Laterality: N/A;   INNER EAR SURGERY Right 2000's   LEFT HEART CATH AND CORONARY ANGIOGRAPHY N/A 04/17/2017   Procedure: Left Heart Cath and Coronary Angiography;  Surgeon: Corky Crafts, MD;  Location: [facility] INVASIVE CV LAB;  Service: Cardiovascular;  Laterality: N/A;   PLANTAR FASCIA RELEASE Right 07/2015   REMOVAL OF STONES  09/15/2021   Procedure: REMOVAL OF STONES;  Surgeon: Vida Rigger, MD;  Location: [facility] ENDOSCOPY;  Service: Endoscopy;;   SPHINCTEROTOMY  09/15/2021   Procedure: Dennison Mascot;  Surgeon: Vida Rigger, MD;  Location: [facility] ENDOSCOPY;  Service: Endoscopy;;   TUBAL LIGATION  1985   ULNAR NERVE TRANSPOSITION Right     Current Outpatient Medications  Medication Sig Dispense Refill   acetaminophen (TYLENOL) 500 MG tablet Take 2 tablets (1,000 mg total) by mouth 3 (three) times daily as needed for mild pain. 30 tablet 0   albuterol (VENTOLIN HFA) 108 (90 Base) MCG/ACT inhaler Inhale 2 puffs into the lungs every 6 (six) hours as needed for wheezing or shortness of breath. (Patient not taking: Reported on 03/11/2023)     alosetron (LOTRONEX) 0.5 MG tablet Take 0.5 mg by mouth 2 (two) times daily. (Patient not taking: Reported on 03/11/2023)     apixaban (ELIQUIS) 5 MG TABS tablet TAKE 1 TABLET BY MOUTH TWICE A DAY (Patient not taking: Reported on 03/11/2023) 180 tablet 3  divalproex (DEPAKOTE ER) 250 MG 24 hr tablet TAKE 3 TABLETS (750MG ) IN THE EVENING 90 tablet 0   dofetilide (TIKOSYN) 500 MCG capsule TAKE 1 CAPSULE(500 MCG) BY MOUTH TWICE DAILY (Patient not taking: Reported on 03/11/2023) 180 capsule 3   doxycycline (VIBRA-TABS) 100 MG tablet Take 1 tablet (100 mg total) by mouth 2 (two) times daily. (Patient not taking: Reported on 03/11/2023) 20 tablet 0   DULoxetine (CYMBALTA) 60 MG capsule Take 2 capsules (120 mg total) by mouth daily. 60 capsule 1   fluticasone (FLONASE) 50 MCG/ACT nasal spray SPRAY 2 SPRAYS INTO EACH NOSTRIL EVERY DAY (Patient not taking: Reported on 03/11/2023) 48 mL 3   gabapentin (NEURONTIN) 100 MG capsule SMARTSIG:2 Capsule(s) By Mouth Every Evening (Patient not taking: Reported on 03/11/2023)     guaiFENesin (MUCINEX) 600 MG 12 hr tablet Take 1 tablet (600 mg total) by mouth 2 (two) times daily as needed for cough or to loosen phlegm. (Patient not taking: Reported on 03/11/2023) 30 tablet 0   LATUDA 60 MG TABS Take 2 tablets (120 mg total) by mouth at  bedtime. 30 tablet 1   metoCLOPramide (REGLAN) 5 MG tablet Take 1 tablet (5 mg total) by mouth every 8 (eight) hours as needed for nausea. (Patient not taking: Reported on 03/11/2023) 15 tablet 0   metoprolol tartrate (LOPRESSOR) 50 MG tablet Take 1 tablet (50 mg total) by mouth 2 (two) times daily. 180 tablet 3   mirabegron ER (MYRBETRIQ) 50 MG TB24 tablet Take 50 mg by mouth daily. (Patient not taking: Reported on 03/11/2023)     potassium chloride (KLOR-CON) 10 MEQ tablet Take 1 tablet (10 mEq total) by mouth daily. (Patient not taking: Reported on 03/11/2023) 90 tablet 1   promethazine (PHENERGAN) 12.5 MG tablet Take 1 tablet (12.5 mg total) by mouth every 8 (eight) hours as needed for nausea or vomiting. 20 tablet 0   topiramate (TOPAMAX) 50 MG tablet Take 50 mg by mouth 2 (two) times daily. (Patient not taking: Reported on 03/11/2023)     zolmitriptan (ZOMIG) 5 MG tablet Take 1 tablet (5 mg total) by mouth as needed for migraine. (Patient not taking: Reported on 03/11/2023) 10 tablet 2   No current facility-administered medications for this visit.    Allergies:   Codeine   Social History:  The patient  reports that she quit smoking about 32 years ago. Her smoking use included cigarettes. She has a 8.00 pack-year smoking history. She has never used smokeless tobacco. She reports that she does not currently use alcohol. She reports that she does not use drugs.   Family History:  The patient's family history includes Alcohol abuse in her father; Atrial fibrillation in her brother; Cardiomyopathy in her daughter; Congestive Heart Failure in her mother; Kidney failure in her mother; Lung cancer in her father; Sudden Cardiac Death in her daughter; Wolff Parkinson White syndrome in her grandchild.  ROS:  Please see the history of present illness.    All other systems are reviewed and otherwise negative.   PHYSICAL EXAM:  VS:  There were no vitals taken for this visit. BMI: There is no height or weight on  file to calculate BMI. Well nourished, well developed, in no acute distress HEENT: normocephalic, atraumatic Neck: no JVD, carotid bruits or masses Cardiac: *** RRR; no significant murmurs, no rubs, or gallops Lungs: *** CTA b/l, no wheezing, rhonchi or rales Abd: not examined today (post-op) MS: no deformity or atrophy Ext: *** no edema Skin: warm  and dry, no rash Neuro:  No gross deficits appreciated, termor is known for her Psych: euthymic mood, full affect   EKG:  Done today and reviewed by myself shows  ***   06/15/2020: TTE IMPRESSIONS   1. Left ventricular ejection fraction, by estimation, is 55 to 60%. The  left ventricle has normal function. The left ventricle has no regional  wall motion abnormalities. Left ventricular diastolic parameters were  normal.   2. Right ventricular systolic function is normal. The right ventricular  size is normal. Tricuspid regurgitation signal is inadequate for assessing  PA pressure.   3. The mitral valve is normal in structure. Trivial mitral valve  regurgitation. No evidence of mitral stenosis.   4. The aortic valve is tricuspid. Aortic valve regurgitation is not  visualized. No aortic stenosis is present.   Comparison(s): 04/17/17 EF 55-60%.   Conclusion(s)/Recommendation(s): Normal biventricular function without  evidence of hemodynamically significant valvular heart disease.    04/17/2017: [facility] The left ventricular systolic function is normal. LV end diastolic pressure is moderately elevated. LVEDP 25 mm Hg. The left ventricular ejection fraction is 50-55% by visual estimate. There is no aortic valve stenosis. No angiographically apparent coronary artery disease.   Recent Labs: 06/10/2022: Magnesium 2.2 03/15/2023: BUN 13; Creatinine, Ser 0.81; Hemoglobin 12.1; Platelets 311; Potassium 3.7; Sodium 135  No results found for requested labs within last 365 days.   CrCl cannot be calculated (Patient's most recent lab result is  older than the maximum 21 days allowed.).   Wt Readings from Last 3 Encounters:  03/16/23 (!) 311 lb (141.1 kg)  03/11/23 (!) 311 lb 9.6 oz (141.3 kg)  11/27/22 (!) 311 lb (141.1 kg)     Other studies reviewed: Additional studies/records reviewed today include: summarized above  ASSESSMENT AND PLAN:  Paroxysmal Afib CHA2DS2Vasc is *** 4, on Eliquis, *** appropriately dosed Minimal burden on Tikosyn *** QTc is stable meds  reviewed *** Teaching re-enforced today and cautioned on new meds to always confirm with the pharmcist or [place] to make sure is OK with her dofetilide  *** She is chronically on the Latuda, Qt looks ok   HTN *** Looks OK , no changes  3. Tremor *** Better on lower Depakote dose with plans to wean off C/w psychiatry/neurology   Disposition: ***   Current medicines are reviewed at length with the patient today.  The patient did not have any concerns regarding medicines.  Norma Fredrickson, PA-C 04/23/2023 8:33 AM     [facility] [address] [place] [address] [phone] (office)  [phone] (fax)

## 2023-04-24 ENCOUNTER — Ambulatory Visit: Payer: Medicaid Other | Attending: Student | Admitting: Physician Assistant

## 2023-04-24 ENCOUNTER — Encounter: Payer: Self-pay | Admitting: Physician Assistant

## 2023-04-25 NOTE — Telephone Encounter (Signed)
Patient is requesting call back to discuss missed appt on 06/20 and to discuss clearance.

## 2023-04-25 NOTE — Telephone Encounter (Signed)
I called the pt back and left a message. From the notes I saw in the chart looks like the scheduler rescheduled the pt to see Otilio Saber, [facility] 06/11/23, pt missed her appt 04/24/23. I left a message not sure if she was looking to see if she could get in sooner. Left message the only appt I saw sooner would be with Francis Dowse, [facility] 06/03/23 @ 8:50. If she is wanting a sooner appt she can call back and let the operator know that is wants to see if there is a sooner appt.

## 2023-04-29 ENCOUNTER — Ambulatory Visit: Payer: Medicaid Other | Admitting: Student

## 2023-05-18 NOTE — Progress Notes (Deleted)
Electrophysiology Office Note:   Date:  05/18/2023  ID:  Regina Morales, DOB 11-14-1960, MRN [id number]  Primary Cardiologist: [name], MD Electrophysiologist: Will Jorja Loa, MD   History of Present Illness:   Regina Morales is a 62 y.o. female with history of HTN, OSA w/CPAP, Afib, DVT/PE (w/pregnancy), GERD, fibromyalgia.     She had elective lap-chole 09/14/21 discharged 09/17/21 to resume her Eliquis 3 days post-op (11/15).  Regina Morales 09/19/21 She comes today accompanied by her roommate. Cardiac-wise she reports feeling OK No CP, no palpitations She is s/p lap-chole/ERCP and still sore, able to eat some yesterday better Roommate has been urging to her to mobilize The patient states she was called that Dr. Elberta Fortis wanted her to come in, was unclear as to why. She resumed Eliquis yesterday as instructed She reports compliance with medicines She has post op surgery follow up in place Note a tremor, in d/w pt/roommate, following with psychiatry and felt 2/2 medicines, the patient states that psychiatry mentioned a side effect of her medication though perhaps a reasonable comprimise for + benefits of her medicines. Urged ongoing discussions, roommate a bit frustrated says they only ever get video visits, they will continue discussions with her PMD and psychiatry provider We discussed her meds, especially Zofran and re-educated about Tikosyn and meds  Pre-op team addressed pre-op eval for colonoscopy in May.  Regina Morales 06/10/22 She is doing pretty well. Post lap-chole she struggled with GI issues and diarrhea, but the lat medication alosetron has done well. Her depakote dose has been reduced and her tremor improved with plans to taper her off all together. She notes by her watch and palpitations AFib with rates towards 130's, these are fairly infrequent and last a minute or so only. Outside of her watch alert and palpitations do not cause any other symptoms. No SOB No CP No  near syncope or syncope. No bleeding or signs of bleeding No changes were made Tikosyn teaching re-enforced Planned for 4 mo visit   Last 2 visit scheduled were canceled  ER visit 03/16/23 w/syncope, event described as  she was walking in a break room at work when she started having shaking with her right arm and started spilling her drink. She then started having unsteadiness and shaking in her legs and then started blacking out. She reports hitting her head and going to the ground. She reports she was out for several seconds before starting to wake up. She is having no nausea or vomiting but had some headache initially.  D/w neurology, seemed less convinced of seizure, though did recommend ER w/u MRI without acute findings,  CTA and CT done at [facility] were normal.   EEG wnl Pt reported being off Tikosyn a month unable to afford refills (apparently no longer covered for her) She was in SR in the ER C/o knee pain, braced and planned for ortho f/u     Need pre-op eval for a colonoscopy  *** doesn't sound cardiac *** NO more Tikosyn, compliance *** defer any neuro/med clearance to her PMD *** AFib? Burden? *** monitor *** eliquis, bleeding, labs, dose *** meds otherwise? Off her depakote?  Neuro/psych   Since last being seen in our clinic the patient reports doing ***.  she denies chest pain, palpitations, dyspnea, PND, orthopnea, nausea, vomiting, dizziness, syncope, edema, weight gain, or early satiety.  Review of systems complete and found to be negative unless listed in HPI.   EP Information / Studies Reviewed:    {  EKGtoday:28818}       Afib hx Diagnosed 2014 Flecainide failed with recurrent arrhythmia Tikosyn started Nov 2019 -> Stopped 02/2023 [employer], affording)   Risk Assessment/Calculations:   {Does this patient have ATRIAL FIBRILLATION?[phone]} No BP recorded.  {Refresh Note OR Click here to enter BP  :1}***        Physical Exam:   VS:   There were no vitals taken for this visit.   Wt Readings from Last 3 Encounters:  03/16/23 (!) 311 lb (141.1 kg)  03/11/23 (!) 311 lb 9.6 oz (141.3 kg)  11/27/22 (!) 311 lb (141.1 kg)     GEN: Well nourished, well developed in no acute distress NECK: No JVD; No carotid bruits CARDIAC: {EPRHYTHM:28826}, no murmurs, rubs, gallops RESPIRATORY:  Clear to auscultation without rales, wheezing or rhonchi  ABDOMEN: Soft, non-tender, non-distended EXTREMITIES:  No edema; No deformity   ASSESSMENT AND PLAN:    Paroxysmal Afib CHA2DS2Vasc is *** 4, on Eliquis, *** appropriately dosed Minimal burden on Tikosyn  *** *** QTc is stable meds  reviewed *** Teaching re-enforced today and cautioned on new meds to always confirm with the pharmcist or [place] to make sure is OK with her dofetilide  *** She is chronically on the Latuda, Qt looks ok   HTN *** Looks OK , no changes  3. Tremor *** Better on lower Depakote dose with plans to wean off C/w psychiatry/neurology   {Click here to Review PMH, Prob List, Meds, Allergies, SHx, FHx  :1}   Follow up with {ZOXWR:60454} {EPFOLLOW UJ:81191}  Signed, Graciella Freer, PA-C

## 2023-05-19 ENCOUNTER — Encounter: Payer: Self-pay | Admitting: Student

## 2023-05-19 ENCOUNTER — Ambulatory Visit: Payer: Medicaid Other | Attending: Student | Admitting: Student

## 2023-05-19 ENCOUNTER — Telehealth: Payer: Self-pay

## 2023-05-19 NOTE — Telephone Encounter (Signed)
   Pre-operative Risk Assessment    Patient Name: Regina Morales  DOB: 1961-08-19 MRN: [id number]     Request for Surgical Clearance    Procedure:  Colonoscopy   Date of Surgery:  Clearance 06/12/23                                 Surgeon:  Dr. Iva Lento Group or Practice Name:  [facility]  Phone number:  [phone] Fax number:  [phone]   Type of Clearance Requested:   - Pharmacy:  Hold Apixaban (Eliquis)     Type of Anesthesia:  propofol    Additional requests/questions:    Scarlette Shorts   05/19/2023, 9:36 AM

## 2023-05-19 NOTE — Telephone Encounter (Signed)
Patient with diagnosis of afib and prior VTE with pregnancy in the 1980s on Eliquis for anticoagulation.    Procedure: colonoscopy Date of procedure: 06/12/23  CHA2DS2-VASc Score = 2  This indicates a 2.2% annual risk of stroke. The patient's score is based upon: CHF History: 0 HTN History: 1 Diabetes History: 0 Stroke History: 0 Vascular Disease History: 0 Age Score: 0 Gender Score: 1   CrCl >149ml/min Platelet count 311k  Would clarify if pt is taking her Eliquis, it's on her med list but it's one of many meds that are marked as not currently taking. If she is taking her Eliquis, she can hold it for 1-2 days prior to procedure.    **This guidance is not considered finalized until pre-operative APP has relayed final recommendations.**

## 2023-05-20 NOTE — Telephone Encounter (Signed)
Pt has been scheduled in office appt with Jacolyn Reedy, [facility] 06/03/23 for pre op clearance. Pt thanked me for the help. I will update all parties involved.

## 2023-05-20 NOTE — Progress Notes (Signed)
Cardiology Office Note:  .   Date:  06/03/2023  ID:  Regina Morales, DOB 1961-01-10, MRN [id number] PCP: Loyola Mast, MD  [facility] Providers Cardiologist:  [name], MD Electrophysiologist:  Will Jorja Loa, MD    History of Present Illness: .   Regina Morales is a 62 y.o. female with history of HTN, OSA w/CPAP, Afib on Tikosyn and eliquis, normal cath 2018, DVT/PE (w/pregnancy), GERD, fibromyalgia, 2 daughters with sudden death, one survived and has ICD. Was supposed to get genetic testing but never did. Her daughter and grand daughter were tested.   Patient on my schedule for preop clearance for colonoscopy 06/12/23. Pharmacy says she can hold 1-2 days prior if she's still taking it. Insurance stopped paying for her tikosyn 6 months ago so she stopped taking it. She's still taking metoprolol and eliquis. Having afib 2-3 times/week. She gets notifications on her apple watch that her HR goes over 120/m she stays calm and it comes down after a few minutes. No regular exercise. Works in [employer].   ROS:    Studies Reviewed: Marland Kitchen    EKG Interpretation Date/Time:  Tuesday June 03 2023 12:29:25 EDT Ventricular Rate:  82 PR Interval:  186 QRS Duration:  88 QT Interval:  380 QTC Calculation: 443 R Axis:   -27  Text Interpretation: Normal sinus rhythm Normal ECG When compared with ECG of 15-Mar-2023 16:48, PREVIOUS ECG IS PRESENT Confirmed by Jacolyn Reedy [id number]) on 06/03/2023 12:31:40 PM    Prior CV Studies: LEFT HEART CATH AND CORONARY ANGIOGRAPHY 04/17/2017  Narrative  The left ventricular systolic function is normal.  LV end diastolic pressure is moderately elevated. LVEDP 25 mm Hg.  The left ventricular ejection fraction is 50-55% by visual estimate.  There is no aortic valve stenosis.  No angiographically apparent coronary artery disease.  Consider diuresis.  Continue preventive therapy.   ECHO COMPLETE WO IMAGING ENHANCING AGENT AND WITH 3D  06/15/2020  Narrative ECHOCARDIOGRAM REPORT    Patient Name:   Regina Morales Date of Exam: 06/15/2020 Medical Rec #:  [id number]        Height:       67.0 in Accession #:    [id number]       Weight:       329.0 lb Date of Birth:  Jul 02, 1961         BSA:          2.499 m Patient Age:    59 years         BP:           134/82 mmHg Patient Gender: F                HR:           64 bpm. Exam Location:  [address]  Procedure: 2D Echo, 3D Echo, Cardiac Doppler, Color Doppler and Strain Analysis  Indications:    I48 Atrial fibrillation. Z68.89 Family history of sudden death. R07.9 Chest pain.  History:        Patient has prior history of Echocardiogram examinations, most recent 04/17/2017. COPD, Arrythmias:Atrial Fibrillation; Risk Factors:Sleep Apnea, Obesity and Former Smoker. Anemia. COVID-19 09/06/19.  Sonographer:    Garald Braver, RDCS Referring Phys: [id number] [address] Stade  IMPRESSIONS   1. Left ventricular ejection fraction, by estimation, is 55 to 60%. The left ventricle has normal function. The left ventricle has no regional wall motion abnormalities. Left ventricular diastolic parameters were normal. 2. Right  ventricular systolic function is normal. The right ventricular size is normal. Tricuspid regurgitation signal is inadequate for assessing PA pressure. 3. The mitral valve is normal in structure. Trivial mitral valve regurgitation. No evidence of mitral stenosis. 4. The aortic valve is tricuspid. Aortic valve regurgitation is not visualized. No aortic stenosis is present.  Comparison(s): 04/17/17 EF 55-60%.  Conclusion(s)/Recommendation(s): Normal biventricular function without evidence of hemodynamically significant valvular heart disease.  FINDINGS Left Ventricle: Left ventricular ejection fraction, by estimation, is 55 to 60%. The left ventricle has normal function. The left ventricle has no regional wall motion abnormalities. The left ventricular internal cavity size  was normal in size. There is no left ventricular hypertrophy. Left ventricular diastolic parameters were normal.  Right Ventricle: The right ventricular size is normal. No increase in right ventricular wall thickness. Right ventricular systolic function is normal. Tricuspid regurgitation signal is inadequate for assessing PA pressure.  Left Atrium: Left atrial size was normal in size.  Right Atrium: Right atrial size was normal in size.  Pericardium: There is no evidence of pericardial effusion.  Mitral Valve: The mitral valve is normal in structure. Trivial mitral valve regurgitation. No evidence of mitral valve stenosis.  Tricuspid Valve: The tricuspid valve is normal in structure. Tricuspid valve regurgitation is not demonstrated. No evidence of tricuspid stenosis.  Aortic Valve: The aortic valve is tricuspid. Aortic valve regurgitation is not visualized. No aortic stenosis is present.  Pulmonic Valve: The pulmonic valve was not well visualized. Pulmonic valve regurgitation is trivial. No evidence of pulmonic stenosis.  Aorta: The aortic root, ascending aorta and aortic arch are all structurally normal, with no evidence of dilitation or obstruction.  IAS/Shunts: The atrial septum is grossly normal.   LEFT VENTRICLE PLAX 2D LVIDd:         4.80 cm  Diastology LVIDs:         3.30 cm  LV e' lateral:   8.84 cm/s LV PW:         1.00 cm  LV E/e' lateral: 6.2 LV IVS:        1.00 cm  LV e' medial:    7.18 cm/s LVOT diam:     2.40 cm  LV E/e' medial:  7.7 LV SV:         79 LV SV Index:   32       2D Longitudinal Strain LVOT Area:     4.52 cm 2D Strain GLS (A2C):   -29.4 % 2D Strain GLS (A3C):   -22.7 % 2D Strain GLS (A4C):   -23.8 % 2D Strain GLS Avg:     -25.3 %  3D Volume EF: 3D EF:        58 % LV EDV:       163 ml LV ESV:       69 ml LV SV:        94 ml  RIGHT VENTRICLE RV Basal diam:  3.70 cm RV S prime:     11.30 cm/s TAPSE (M-mode): 2.7 cm  LEFT ATRIUM              Index       RIGHT ATRIUM           Index LA diam:        3.80 cm 1.52 cm/m  RA Pressure: 3.00 mmHg LA Vol (A2C):   41.4 ml 16.57 ml/m RA Area:     12.10 cm LA Vol (A4C):   41.1 ml 16.45 ml/m RA Volume:  29.40 ml  11.77 ml/m LA Biplane Vol: 41.7 ml 16.69 ml/m AORTIC VALVE LVOT Vmax:   95.30 cm/s LVOT Vmean:  62.100 cm/s LVOT VTI:    0.175 m  AORTA Ao Root diam: 3.40 cm Ao Asc diam:  3.40 cm  MITRAL VALVE               TRICUSPID VALVE Estimated RAP:  3.00 mmHg  MV E velocity: 55.10 cm/s  SHUNTS MV A velocity: 55.70 cm/s  Systemic VTI:  0.18 m MV E/A ratio:  0.99        Systemic Diam: 2.40 cm  Jodelle Red MD Electronically signed by Jodelle Red MD Signature Date/Time: 06/15/2020/4:59:39 PM    Final       Risk Assessment/Calculations:    CHA2DS2-VASc Score = 2   This indicates a 2.2% annual risk of stroke. The patient's score is based upon: CHF History: 0 HTN History: 1 Diabetes History: 0 Stroke History: 0 Vascular Disease History: 0 Age Score: 0 Gender Score: 1             Physical Exam:   VS:  BP 128/84   Pulse 88   Ht 5\' 7"  (1.702 m)   Wt (!) 314 lb 9.6 oz (142.7 kg)   SpO2 97%   BMI 49.27 kg/m    Wt Readings from Last 3 Encounters:  06/03/23 (!) 314 lb 9.6 oz (142.7 kg)  03/16/23 (!) 311 lb (141.1 kg)  03/11/23 (!) 311 lb 9.6 oz (141.3 kg)    GEN: Obese in no acute distress NECK: No JVD; No carotid bruits CARDIAC:  RRR, no murmurs, rubs, gallops RESPIRATORY:  Clear to auscultation without rales, wheezing or rhonchi  ABDOMEN: Soft, non-tender, non-distended EXTREMITIES:  No edema; No deformity   ASSESSMENT AND PLAN: .    Preop clearance for colonoscopy 06/12/23 by Dr. Jeani Hawking  on eliquis can hold for 1-2 days prior to procedure per pharmacy team. METs are >6 and she doesn't need fruther w/u prior to colonoscopy. She's been off Tikosyn for over 6 months and having PAF so will be at increased risk of Afib during  colonoscopy.   According to the Revised Cardiac Risk Index (RCRI), her Perioperative Risk of Major Cardiac Event is (%): 0.4  Her Functional Capacity in METs is: 6.36 according to the Duke Activity Status Index (DASI).    Chest pain - Cath 04/17/17 no CAD non cardiac resolved     PAF - maintaining NSR but having recurrence 3 times/weekly since off tikosyn for the past 5 months(medicaid won't pay and it would cost $1000 per patient) continue metoprolol and eliquis f/u Camnitz BMET & CBC stable 03/2023 on [facility]. Discussed with Dr. Elberta Fortis and he recommends referral to Afib clinic. They will look into cost of restarting Tikosyn.   Prior PE/DVT - distant on Eliquis for her PAF     Obesity - weight loss encouraged.      OSA:  managed by PCP   HTN - Well controlled.       Family History of Sudden Death. Her two daughters have had events and they have different fathers so possibility that genetic abnormality runs with her Seen by Sidney Ace 05/11/20 and genetic testing for non ischemic DCM recommended         Dispo: f/u in afib clinic  Signed, Jacolyn Reedy, PA-C

## 2023-05-20 NOTE — Telephone Encounter (Signed)
    Primary Cardiologist:Peter Eden Emms, MD  Chart reviewed as part of pre-operative protocol coverage. Because of Regina Morales's past medical history and time since last visit, he/she will require a follow-up office visit in order to better assess preoperative cardiovascular risk.  Pre-op covering staff: - Please schedule appointment and call patient to inform them. - Please contact requesting surgeon's office via preferred method (i.e, phone, fax) to inform them of need for appointment prior to surgery.  If applicable, this message will also be routed to pharmacy pool and/or primary cardiologist for input on holding anticoagulant/antiplatelet agent as requested below so that this information is available at time of patient's appointment.   Ronney Asters, NP  05/20/2023, 11:00 AM

## 2023-06-03 ENCOUNTER — Ambulatory Visit: Payer: Medicaid Other | Attending: Physician Assistant | Admitting: Physician Assistant

## 2023-06-03 ENCOUNTER — Ambulatory Visit: Payer: Medicaid Other | Admitting: Physician Assistant

## 2023-06-03 ENCOUNTER — Encounter: Payer: Self-pay | Admitting: Physician Assistant

## 2023-06-03 VITALS — BP 128/84 | HR 88 | Ht 67.0 in | Wt 314.6 lb

## 2023-06-03 DIAGNOSIS — Z87898 Personal history of other specified conditions: Secondary | ICD-10-CM | POA: Diagnosis not present

## 2023-06-03 DIAGNOSIS — I480 Paroxysmal atrial fibrillation: Secondary | ICD-10-CM

## 2023-06-03 DIAGNOSIS — Z6841 Body Mass Index (BMI) 40.0 and over, adult: Secondary | ICD-10-CM

## 2023-06-03 DIAGNOSIS — G4733 Obstructive sleep apnea (adult) (pediatric): Secondary | ICD-10-CM

## 2023-06-03 DIAGNOSIS — Z86718 Personal history of other venous thrombosis and embolism: Secondary | ICD-10-CM | POA: Diagnosis not present

## 2023-06-03 DIAGNOSIS — Z01818 Encounter for other preprocedural examination: Secondary | ICD-10-CM | POA: Diagnosis not present

## 2023-06-03 DIAGNOSIS — I10 Essential (primary) hypertension: Secondary | ICD-10-CM

## 2023-06-03 NOTE — Patient Instructions (Signed)
Medication Instructions:  Your physician recommends that you continue on your current medications as directed. Please refer to the Current Medication list given to you today.  *If you need a refill on your cardiac medications before your next appointment, please call your pharmacy*   Lab Work: None ordered   If you have labs (blood work) drawn today and your tests are completely normal, you will receive your results only by: MyChart Message (if you have MyChart) OR A paper copy in the mail If you have any lab test that is abnormal or we need to change your treatment, we will call you to review the results.   Testing/Procedures: None ordered    Follow-Up: Follow up with the A-Fib Clinic   Other Instructions

## 2023-06-10 ENCOUNTER — Inpatient Hospital Stay (HOSPITAL_COMMUNITY): Admission: RE | Admit: 2023-06-10 | Payer: Medicaid Other | Source: Ambulatory Visit | Admitting: Internal Medicine

## 2023-06-11 ENCOUNTER — Ambulatory Visit: Payer: Medicaid Other | Admitting: Student

## 2023-06-12 LAB — HM COLONOSCOPY

## 2023-06-16 ENCOUNTER — Encounter: Payer: Self-pay | Admitting: Family Medicine

## 2023-06-19 ENCOUNTER — Encounter (HOSPITAL_COMMUNITY): Payer: Self-pay | Admitting: Internal Medicine

## 2023-06-19 ENCOUNTER — Ambulatory Visit (HOSPITAL_COMMUNITY)
Admission: RE | Admit: 2023-06-19 | Discharge: 2023-06-19 | Disposition: A | Discharge status: Home / Self Care | Payer: Medicaid Other | Source: Ambulatory Visit | Attending: Internal Medicine | Admitting: Internal Medicine

## 2023-06-19 VITALS — BP 124/74 | HR 109 | Ht 67.0 in | Wt 319.0 lb

## 2023-06-19 DIAGNOSIS — K219 Gastro-esophageal reflux disease without esophagitis: Secondary | ICD-10-CM | POA: Insufficient documentation

## 2023-06-19 DIAGNOSIS — G4733 Obstructive sleep apnea (adult) (pediatric): Secondary | ICD-10-CM | POA: Diagnosis not present

## 2023-06-19 DIAGNOSIS — Z7901 Long term (current) use of anticoagulants: Secondary | ICD-10-CM | POA: Insufficient documentation

## 2023-06-19 DIAGNOSIS — I10 Essential (primary) hypertension: Secondary | ICD-10-CM | POA: Diagnosis not present

## 2023-06-19 DIAGNOSIS — I82409 Acute embolism and thrombosis of unspecified deep veins of unspecified lower extremity: Secondary | ICD-10-CM | POA: Insufficient documentation

## 2023-06-19 DIAGNOSIS — Z79899 Other long term (current) drug therapy: Secondary | ICD-10-CM | POA: Diagnosis not present

## 2023-06-19 DIAGNOSIS — I480 Paroxysmal atrial fibrillation: Secondary | ICD-10-CM | POA: Insufficient documentation

## 2023-06-19 NOTE — Progress Notes (Signed)
Primary Care Physician: Loyola Mast, MD Primary Cardiologist: [name], MD Electrophysiologist: Will Jorja Loa, MD     Referring Physician: Dr. Camnitz     Regina Morales is a 62 y.o. female with a history of HTN, OSA on CPAP, DVT/PE, GERD, fibromyalgia, and atrial fibrillation who presents for consultation in the [facility]. Patient previously on Tikosyn but during visit with cardiology on 7/30 noted her insurance stopped paying for Tikosyn 6 months prior. She has been having episodes of Afib several times a week. Patient is on Eliquis for a CHADS2VASC score of 2.  On evaluation today, she is currently in NSR. She does admit that her Apple Watch has been confirming paroxysmal episodes of Afib. She feels like she cannot swallow and heart races when in Afib. Episodes are lasting for a few hours at a time. She stopped Tikosyn around April of this year due to being told it was going to cost her $1,500 a month. No missed doses or issues with Eliquis.   Today, she denies symptoms of palpitations, chest pain, shortness of breath, orthopnea, PND, lower extremity edema, dizziness, presyncope, syncope, snoring, daytime somnolence, bleeding, or neurologic sequela. The patient is tolerating medications without difficulties and is otherwise without complaint today.    Atrial Fibrillation Risk Factors:  she does have symptoms or diagnosis of sleep apnea. she is compliant with CPAP therapy.  she has a BMI of Body mass index is 49.96 kg/m.Marland Kitchen Filed Weights   06/19/23 1432  Weight: (!) 144.7 kg    Current Outpatient Medications  Medication Sig Dispense Refill   acetaminophen (TYLENOL) 500 MG tablet Take 2 tablets (1,000 mg total) by mouth 3 (three) times daily as needed for mild pain. 30 tablet 0   apixaban (ELIQUIS) 5 MG TABS tablet TAKE 1 TABLET BY MOUTH TWICE A DAY 180 tablet 3   divalproex (DEPAKOTE ER) 250 MG 24 hr tablet Take 250 mg by mouth daily.      DULoxetine (CYMBALTA) 60 MG capsule Take 2 capsules (120 mg total) by mouth daily. (Patient taking differently: Take 60 mg by mouth daily.) 60 capsule 1   fluticasone (FLONASE) 50 MCG/ACT nasal spray SPRAY 2 SPRAYS INTO EACH NOSTRIL EVERY DAY (Patient taking differently: as needed. SPRAY 2 SPRAYS INTO EACH NOSTRIL EVERY DAY) 48 mL 3   LATUDA 60 MG TABS Take 2 tablets (120 mg total) by mouth at bedtime. 30 tablet 1   metoprolol tartrate (LOPRESSOR) 50 MG tablet Take 1 tablet (50 mg total) by mouth 2 (two) times daily. 180 tablet 3   No current facility-administered medications for this encounter.    Atrial Fibrillation Management history:  Previous antiarrhythmic drugs: flecainide failure, tikosyn discontinuation due to cost Previous cardioversions: none Previous ablations: none Anticoagulation history: Eliquis  ROS- All systems are reviewed and negative except as per the HPI above.  Physical Exam: BP 124/74   Pulse (!) 109   Ht 5\' 7"  (1.702 m)   Wt (!) 144.7 kg   BMI 49.96 kg/m   GEN: Well nourished, well developed in no acute distress NECK: No JVD; No carotid bruits CARDIAC: Regular tachycardic rate and rhythm, no murmurs, rubs, gallops RESPIRATORY:  Clear to auscultation without rales, wheezing or rhonchi  ABDOMEN: Soft, non-tender, non-distended EXTREMITIES:  No edema; No deformity   EKG today demonstrates  Vent. rate 109 BPM PR interval 180 ms QRS duration 80 ms QT/QTcB 332/447 ms P-R-T axes -15 96 -18 Sinus tachycardia Rightward axis  Anterior infarct , age undetermined Abnormal ECG When compared with ECG of 03-Jun-2023 12:29, PREVIOUS ECG IS PRESENT  Echo 06/15/20 demonstrated   1. Left ventricular ejection fraction, by estimation, is 55 to 60%. The  left ventricle has normal function. The left ventricle has no regional  wall motion abnormalities. Left ventricular diastolic parameters were  normal.   2. Right ventricular systolic function is normal. The  right ventricular  size is normal. Tricuspid regurgitation signal is inadequate for assessing  PA pressure.   3. The mitral valve is normal in structure. Trivial mitral valve  regurgitation. No evidence of mitral stenosis.   4. The aortic valve is tricuspid. Aortic valve regurgitation is not  visualized. No aortic stenosis is present.   Comparison(s): 04/17/17 EF 55-60%.   ASSESSMENT & PLAN CHA2DS2-VASc Score = 2  The patient's score is based upon: CHF History: 0 HTN History: 1 Diabetes History: 0 Stroke History: 0 Vascular Disease History: 0 Age Score: 0 Gender Score: 1       ASSESSMENT AND PLAN: Paroxysmal Atrial Fibrillation (ICD10:  I48.0) The patient's CHA2DS2-VASc score is 2, indicating a 2.2% annual risk of stroke.    She is currently in NSR.  Previous failure on flecainide. She is young at 62 yo so would like to avoid amiodarone. Her BMI makes ablation prohibitive. Counseled on compliance with medication and office visits for Tikosyn. She was previously on 500 mcg BID dosage. She will confirm pricing with insurance but otherwise would like to do readmission for Tikosyn load. Qtc interval is acceptable.   She will call with confirmation and to schedule Tikosyn admission.     Lake Bells, PA-C  Afib Clinic [facility] [address] [place], [place] [address] [phone]

## 2023-06-19 NOTE — Patient Instructions (Signed)
Please call us when you have confirmed pricing on Dofetilide (Tikosyn) 500 mcg twice daily.

## 2023-06-23 ENCOUNTER — Telehealth (HOSPITAL_COMMUNITY): Payer: Self-pay | Admitting: *Deleted

## 2023-06-23 NOTE — Telephone Encounter (Signed)
-----   Message from [facility] L sent at 06/23/2023  3:31 PM EDT -----  ----- Message ----- From: Rosalee Kaufman, RPH-CPP Sent: 06/20/2023  10:17 AM EDT To: Learta Codding, CMA  Medication list reviewed in anticipation of upcoming Tikosyn initiation. Patient is taking Demont which can potentially increase Tikosyn concentrations  Patient is anticoagulated on Eliquis on the appropriate dose. Please ensure that patient has not missed any anticoagulation doses in the 3 weeks prior to Tikosyn initiation.   Patient will need to be counseled to avoid use of Benadryl while on Tikosyn and in the 2-3 days prior to Tikosyn initiation. ----- Message ----- From: Learta Codding, CMA Sent: 06/19/2023   4:04 PM EDT To: [facility]  Please review medications. Patient is scheduled for Tikosyn admission on 9/16. Thanks, [facility] CMA A-fib Clinic

## 2023-06-23 NOTE — Telephone Encounter (Signed)
No, can continue [place]

## 2023-06-26 ENCOUNTER — Ambulatory Visit: Payer: Medicaid Other | Admitting: Student

## 2023-07-04 ENCOUNTER — Telehealth (HOSPITAL_COMMUNITY): Payer: Self-pay

## 2023-07-04 NOTE — Telephone Encounter (Signed)
Tikosyn admission has been approved. Date of service: 07/21/2023 Approval # [id number]

## 2023-07-10 ENCOUNTER — Other Ambulatory Visit: Payer: Self-pay | Admitting: Cardiovascular Disease

## 2023-07-10 DIAGNOSIS — I480 Paroxysmal atrial fibrillation: Secondary | ICD-10-CM

## 2023-07-11 NOTE — Telephone Encounter (Signed)
Prescription refill request for Eliquis received. Indication:afib Last office visit:8/24 Scr:0.81  5/24 Age: 62 Weight:144.7  kg  Prescription refilled

## 2023-07-18 ENCOUNTER — Encounter (HOSPITAL_COMMUNITY): Payer: Self-pay

## 2023-07-21 ENCOUNTER — Ambulatory Visit (HOSPITAL_COMMUNITY)
Admission: RE | Admit: 2023-07-21 | Discharge: 2023-07-21 | Disposition: A | Discharge status: Home / Self Care | Payer: Medicaid Other | Source: Ambulatory Visit | Attending: Internal Medicine | Admitting: Internal Medicine

## 2023-07-21 ENCOUNTER — Encounter (HOSPITAL_COMMUNITY): Payer: Self-pay | Admitting: Internal Medicine

## 2023-07-21 ENCOUNTER — Other Ambulatory Visit: Payer: Self-pay

## 2023-07-21 ENCOUNTER — Encounter (HOSPITAL_COMMUNITY): Payer: Self-pay | Admitting: Cardiovascular Disease

## 2023-07-21 ENCOUNTER — Inpatient Hospital Stay (HOSPITAL_COMMUNITY)
Admission: AD | Admit: 2023-07-21 | Discharge: 2023-07-24 | DRG: 310 | Disposition: A | Discharge status: Home / Self Care | Payer: Medicaid Other | Source: Ambulatory Visit | Attending: Cardiology | Admitting: Cardiology

## 2023-07-21 VITALS — BP 128/80 | HR 67 | Ht 67.0 in | Wt 315.2 lb

## 2023-07-21 DIAGNOSIS — I10 Essential (primary) hypertension: Secondary | ICD-10-CM | POA: Insufficient documentation

## 2023-07-21 DIAGNOSIS — R9431 Abnormal electrocardiogram [ECG] [EKG]: Secondary | ICD-10-CM | POA: Insufficient documentation

## 2023-07-21 DIAGNOSIS — K219 Gastro-esophageal reflux disease without esophagitis: Secondary | ICD-10-CM | POA: Diagnosis present

## 2023-07-21 DIAGNOSIS — Z9049 Acquired absence of other specified parts of digestive tract: Secondary | ICD-10-CM

## 2023-07-21 DIAGNOSIS — I480 Paroxysmal atrial fibrillation: Principal | ICD-10-CM | POA: Diagnosis present

## 2023-07-21 DIAGNOSIS — Z86711 Personal history of pulmonary embolism: Secondary | ICD-10-CM | POA: Diagnosis not present

## 2023-07-21 DIAGNOSIS — J449 Chronic obstructive pulmonary disease, unspecified: Secondary | ICD-10-CM | POA: Diagnosis present

## 2023-07-21 DIAGNOSIS — Z86718 Personal history of other venous thrombosis and embolism: Secondary | ICD-10-CM | POA: Diagnosis not present

## 2023-07-21 DIAGNOSIS — Z79899 Other long term (current) drug therapy: Secondary | ICD-10-CM | POA: Diagnosis not present

## 2023-07-21 DIAGNOSIS — Z713 Dietary counseling and surveillance: Secondary | ICD-10-CM | POA: Diagnosis not present

## 2023-07-21 DIAGNOSIS — E7800 Pure hypercholesterolemia, unspecified: Secondary | ICD-10-CM | POA: Diagnosis present

## 2023-07-21 DIAGNOSIS — G4733 Obstructive sleep apnea (adult) (pediatric): Secondary | ICD-10-CM | POA: Diagnosis present

## 2023-07-21 DIAGNOSIS — K760 Fatty (change of) liver, not elsewhere classified: Secondary | ICD-10-CM | POA: Diagnosis present

## 2023-07-21 DIAGNOSIS — Z7901 Long term (current) use of anticoagulants: Secondary | ICD-10-CM | POA: Insufficient documentation

## 2023-07-21 DIAGNOSIS — Z87891 Personal history of nicotine dependence: Secondary | ICD-10-CM | POA: Diagnosis not present

## 2023-07-21 DIAGNOSIS — M797 Fibromyalgia: Secondary | ICD-10-CM | POA: Diagnosis present

## 2023-07-21 DIAGNOSIS — F32A Depression, unspecified: Secondary | ICD-10-CM | POA: Diagnosis present

## 2023-07-21 DIAGNOSIS — Z885 Allergy status to narcotic agent status: Secondary | ICD-10-CM

## 2023-07-21 DIAGNOSIS — I4819 Other persistent atrial fibrillation: Principal | ICD-10-CM | POA: Diagnosis present

## 2023-07-21 DIAGNOSIS — Z6841 Body Mass Index (BMI) 40.0 and over, adult: Secondary | ICD-10-CM

## 2023-07-21 LAB — BASIC METABOLIC PANEL
Anion gap: 9 (ref 5–15)
Anion gap: 9 (ref 5–15)
BUN: 15 mg/dL (ref 6–20)
BUN: 12 mg/dL (ref 6–20)
CO2: 23 mmol/L (ref 22–32)
CO2: 23 mmol/L (ref 22–32)
Calcium: 8.7 mg/dL — ABNORMAL LOW (ref 8.9–10.3)
Calcium: 8.8 mg/dL — ABNORMAL LOW (ref 8.9–10.3)
Chloride: 108 mmol/L (ref 98–111)
Chloride: 108 mmol/L (ref 98–111)
Creatinine, Ser: 0.92 mg/dL (ref 0.44–1.00)
Creatinine, Ser: 0.9 mg/dL (ref 0.44–1.00)
GFR, Estimated: >60 mL/min (ref >60)
GFR, Estimated: >60 mL/min (ref >60)
Glucose, Bld: 120 mg/dL — ABNORMAL HIGH (ref 70–99)
Glucose, Bld: 98 mg/dL (ref 70–99)
Potassium: 3.5 mmol/L (ref 3.5–5.1)
Potassium: 3.9 mmol/L (ref 3.5–5.1)
Sodium: 140 mmol/L (ref 135–145)
Sodium: 140 mmol/L (ref 135–145)

## 2023-07-21 LAB — HIV ANTIBODY (ROUTINE TESTING W REFLEX): HIV Screen 4th Generation wRfx: NONREACTIVE

## 2023-07-21 LAB — MAGNESIUM: Magnesium: 2.1 mg/dL (ref 1.7–2.4)

## 2023-07-21 MED ORDER — DIVALPROEX SODIUM ER 250 MG PO TB24
250.0000 mg | ORAL_TABLET | Freq: Every day | ORAL | Status: DC
  Administered 2023-07-21 – 2023-07-23 (×3): 250 mg via ORAL
  Filled 2023-07-21 (×4): qty 1

## 2023-07-21 MED ORDER — SODIUM CHLORIDE 0.9% FLUSH: 3.0000 mL | INTRAVENOUS | Status: DC | PRN

## 2023-07-21 MED ORDER — SODIUM CHLORIDE 0.9% FLUSH
3.0000 mL | Freq: Two times a day (BID) | INTRAVENOUS | Status: DC
  Administered 2023-07-21 – 2023-07-24 (×6): 3 mL via INTRAVENOUS

## 2023-07-21 MED ORDER — DOFETILIDE 500 MCG PO CAPS
500.0000 ug | ORAL_CAPSULE | Freq: Two times a day (BID) | ORAL | Status: DC
  Administered 2023-07-22 – 2023-07-24 (×6): 500 ug via ORAL
  Filled 2023-07-21 (×6): qty 1

## 2023-07-21 MED ORDER — DULOXETINE HCL 60 MG PO CPEP
60.0000 mg | ORAL_CAPSULE | Freq: Every day | ORAL | Status: DC
  Administered 2023-07-21 – 2023-07-23 (×3): 60 mg via ORAL
  Filled 2023-07-21 (×3): qty 1

## 2023-07-21 MED ORDER — POTASSIUM CHLORIDE CRYS ER 20 MEQ PO TBCR
40.0000 meq | EXTENDED_RELEASE_TABLET | ORAL | Status: AC
  Administered 2023-07-21: 40 meq via ORAL
  Filled 2023-07-21: qty 2

## 2023-07-21 MED ORDER — SODIUM CHLORIDE 0.9 % IV SOLN: 250.0000 mL | INTRAVENOUS | Status: DC | PRN

## 2023-07-21 MED ORDER — HYDROXYZINE HCL 25 MG PO TABS
25.0000 mg | ORAL_TABLET | Freq: Every day | ORAL | Status: DC
  Administered 2023-07-21 – 2023-07-23 (×3): 25 mg via ORAL
  Filled 2023-07-21 (×3): qty 1

## 2023-07-21 MED ORDER — APIXABAN 5 MG PO TABS
5.0000 mg | ORAL_TABLET | Freq: Two times a day (BID) | ORAL | Status: DC
  Administered 2023-07-21 – 2023-07-24 (×6): 5 mg via ORAL
  Filled 2023-07-21 (×6): qty 1

## 2023-07-21 MED ORDER — MIRABEGRON ER 25 MG PO TB24
25.0000 mg | ORAL_TABLET | Freq: Every day | ORAL | Status: DC
  Administered 2023-07-22 – 2023-07-24 (×3): 25 mg via ORAL
  Filled 2023-07-21 (×3): qty 1

## 2023-07-21 MED ORDER — POTASSIUM CHLORIDE CRYS ER 20 MEQ PO TBCR
60.0000 meq | EXTENDED_RELEASE_TABLET | ORAL | Status: AC
  Administered 2023-07-21: 60 meq via ORAL
  Filled 2023-07-21: qty 3

## 2023-07-21 MED ORDER — METOPROLOL TARTRATE 50 MG PO TABS
50.0000 mg | ORAL_TABLET | Freq: Two times a day (BID) | ORAL | Status: DC
  Administered 2023-07-21 – 2023-07-24 (×6): 50 mg via ORAL
  Filled 2023-07-21 (×6): qty 1

## 2023-07-21 MED ORDER — LURASIDONE HCL 20 MG PO TABS
60.0000 mg | ORAL_TABLET | Freq: Every day | ORAL | Status: DC
  Administered 2023-07-21 – 2023-07-23 (×3): 60 mg via ORAL
  Filled 2023-07-21 (×4): qty 3

## 2023-07-21 MED ORDER — HYDROXYZINE PAMOATE 25 MG PO CAPS: 25.0000 mg | ORAL_CAPSULE | Freq: Every day | ORAL | Status: DC

## 2023-07-21 NOTE — Progress Notes (Signed)
Pharmacy: Dofetilide (Tikosyn) - Initial Consult Assessment and Electrolyte Replacement  Pharmacy consulted to assist in monitoring and replacing electrolytes in this 62 y.o. female admitted on 07/21/2023 undergoing dofetilide initiation. First dofetilide dose: 500 mcg  Assessment:  Patient Exclusion Criteria: If any screening criteria checked as "Yes", then  patient  should NOT receive dofetilide until criteria item is corrected.  If "Yes" please indicate correction plan.  YES  NO Patient  Exclusion Criteria Correction Plan   []   [x]   Baseline QTc interval is greater than or equal to 440 msec. IF above YES box checked dofetilide contraindicated unless patient has ICD; then may proceed if QTc 500-550 msec or with known ventricular conduction abnormalities may proceed with QTc 550-600 msec. QTc =   443 ms - EP aware   []   [x]   Patient is known or suspected to have a digoxin level greater than 2 ng/ml: No results found for: "DIGOXIN"     []   [x]   Creatinine clearance less than 20 ml/min (calculated using Cockcroft-Gault, actual body weight and serum creatinine): Estimated Creatinine Clearance: 96.7 mL/min (by C-G formula based on SCr of 0.92 mg/dL).     []   [x]  Patient has received drugs known to prolong the QT intervals within the last 48 hours (phenothiazines, tricyclics or tetracyclic antidepressants, erythromycin, H-1 antihistamines, cisapride, fluoroquinolones, azithromycin, ondansetron).   Updated information on QT prolonging agents is available to be searched on the following database:QT prolonging agents     []   [x]   Patient received a dose of hydrochlorothiazide (Oretic) alone or in any combination including triamterene (Dyazide, Maxzide) in the last 48 hours.    []   [x]  Patient received a medication known to increase dofetilide plasma concentrations prior to initial dofetilide dose:  Trimethoprim (Primsol, Proloprim) in the last 36 hours Verapamil (Calan, Verelan) in  the last 36 hours or a sustained release dose in the last 72 hours Megestrol (Megace) in the last 5 days  Cimetidine (Tagamet) in the last 6 hours Ketoconazole (Nizoral) in the last 24 hours Itraconazole (Sporanox) in the last 48 hours  Prochlorperazine (Compazine) in the last 36 hours     []   [x]   Patient is known to have a history of torsades de pointes; congenital or acquired long QT syndromes.    []   [x]   Patient has received a Class 1 antiarrhythmic with less than 2 half-lives since last dose. (Disopyramide, Quinidine, Procainamide, Lidocaine, Mexiletine, Flecainide, Propafenone)    []   [x]   Patient has received amiodarone therapy in the past 3 months or amiodarone level is greater than 0.3 ng/ml.    Labs:    Component Value Date/Time   K 3.5 07/21/2023 1055   MG 2.1 07/21/2023 1055     Plan: Select One Calculated CrCl  Dose q12h  [x]  > 60 ml/min 500 mcg  []  40-60 ml/min 250 mcg  []  20-40 ml/min 125 mcg   []   Physician selected initial dose within range recommended for patients level of renal function - will monitor for response.  []   Physician selected initial dose outside of range recommended for patients level of renal function - will discuss if the dose should be altered at this time.   Patient has been appropriately anticoagulated with apixaban.  Potassium: K 3.5-3.7:  Hold Tikosyn initiation and give KCl 60 mEq po x1 and repeat BMET 2hr after dose - repeat appropriate dose if K < 4    Magnesium: Mg >2: Appropriate to initiate Tikosyn, no replacement needed  Thank you for allowing pharmacy to participate in this patient's care   Loralee Pacas, PharmD, BCPS 07/21/2023  5:17 PM

## 2023-07-21 NOTE — Progress Notes (Signed)
Primary Care Physician: Loyola Mast, MD Primary Cardiologist: [name], MD Electrophysiologist: Will Jorja Loa, MD     Referring Physician: Dr. Camnitz     Regina Morales is a 62 y.o. female with a history of HTN, OSA on CPAP, DVT/PE, GERD, fibromyalgia, and atrial fibrillation who presents for consultation in the [facility]. Patient previously on Tikosyn but during visit with cardiology on 7/30 noted her insurance stopped paying for Tikosyn 6 months prior. She has been having episodes of Afib several times a week. Patient is on Eliquis for a CHADS2VASC score of 2.  On evaluation today, she is currently in NSR. She does admit that her Apple Watch has been confirming paroxysmal episodes of Afib. She feels like she cannot swallow and heart races when in Afib. Episodes are lasting for a few hours at a time. She stopped Tikosyn around April of this year due to being told it was going to cost her $1,500 a month. No missed doses or issues with Eliquis.   On follow up 07/21/23, patient is here for restart of Tikosyn admission. She was previously on Tikosyn 500 mcg BID. No new medications since last OV. No benadryl use. No missed doses of anticoagulant.   Today, she denies symptoms of palpitations, chest pain, shortness of breath, orthopnea, PND, lower extremity edema, dizziness, presyncope, syncope, snoring, daytime somnolence, bleeding, or neurologic sequela. The patient is tolerating medications without difficulties and is otherwise without complaint today.    Atrial Fibrillation Risk Factors:  she does have symptoms or diagnosis of sleep apnea. she is compliant with CPAP therapy.  she has a BMI of Body mass index is 49.37 kg/m.Marland Kitchen Filed Weights   07/21/23 1100  Weight: (!) 143 kg     Current Outpatient Medications  Medication Sig Dispense Refill   acetaminophen (TYLENOL) 500 MG tablet Take 2 tablets (1,000 mg total) by mouth 3 (three) times  daily as needed for mild pain. 30 tablet 0   divalproex (DEPAKOTE ER) 250 MG 24 hr tablet Take 250 mg by mouth daily.     DULoxetine (CYMBALTA) 60 MG capsule Take 2 capsules (120 mg total) by mouth daily. (Patient taking differently: Take 60 mg by mouth daily.) 60 capsule 1   ELIQUIS 5 MG TABS tablet TAKE 1 TABLET BY MOUTH TWICE A DAY 180 tablet 3   fluticasone (FLONASE) 50 MCG/ACT nasal spray SPRAY 2 SPRAYS INTO EACH NOSTRIL EVERY DAY (Patient taking differently: as needed. SPRAY 2 SPRAYS INTO EACH NOSTRIL EVERY DAY) 48 mL 3   GEMTESA 75 MG TABS Take 1 tablet by mouth daily.     hydrOXYzine (VISTARIL) 25 MG capsule Take 25-50 mg by mouth at bedtime.     LATUDA 60 MG TABS Take 2 tablets (120 mg total) by mouth at bedtime. 30 tablet 1   metoprolol tartrate (LOPRESSOR) 50 MG tablet Take 1 tablet (50 mg total) by mouth 2 (two) times daily. 180 tablet 3   No current facility-administered medications for this encounter.    Atrial Fibrillation Management history:  Previous antiarrhythmic drugs: flecainide failure, tikosyn discontinuation due to cost Previous cardioversions: none Previous ablations: none Anticoagulation history: Eliquis  ROS- All systems are reviewed and negative except as per the HPI above.  Physical Exam: BP 128/80   Pulse 67   Ht 5\' 7"  (1.702 m)   Wt (!) 143 kg   BMI 49.37 kg/m   GEN- The patient is well appearing, alert and oriented x 3  today.   Neck - no JVD or carotid bruit noted Lungs- Clear to ausculation bilaterally, normal work of breathing Heart- Regular rate and rhythm, no murmurs, rubs or gallops, PMI not laterally displaced Extremities- no clubbing, cyanosis, or edema Skin - no rash or ecchymosis noted   EKG today demonstrates  Vent. rate 67 BPM PR interval 206 ms QRS duration 90 ms QT/QTcB 424/448 ms P-R-T axes 53 -12 38 Normal sinus rhythm Cannot rule out Anterior infarct , age undetermined Abnormal ECG When compared with ECG of 19-Jun-2023  14:41, PREVIOUS ECG IS PRESENT  Echo 06/15/20 demonstrated   1. Left ventricular ejection fraction, by estimation, is 55 to 60%. The  left ventricle has normal function. The left ventricle has no regional  wall motion abnormalities. Left ventricular diastolic parameters were  normal.   2. Right ventricular systolic function is normal. The right ventricular  size is normal. Tricuspid regurgitation signal is inadequate for assessing  PA pressure.   3. The mitral valve is normal in structure. Trivial mitral valve  regurgitation. No evidence of mitral stenosis.   4. The aortic valve is tricuspid. Aortic valve regurgitation is not  visualized. No aortic stenosis is present.   Comparison(s): 04/17/17 EF 55-60%.   ASSESSMENT & PLAN CHA2DS2-VASc Score = 2  The patient's score is based upon: CHF History: 0 HTN History: 1 Diabetes History: 0 Stroke History: 0 Vascular Disease History: 0 Age Score: 0 Gender Score: 1       ASSESSMENT AND PLAN: Paroxysmal Atrial Fibrillation (ICD10:  I48.0) The patient's CHA2DS2-VASc score is 2, indicating a 2.2% annual risk of stroke.    Patient presents for dofetilide admission. She is here for Tikosyn restart; previously on 500 mcg dose. Continue Eliquis, states no missed doses in the last 3 weeks. No recent benadryl use. Can continue Latuda per pharmacist; noted may increase Tikosyn concentrations.  PharmD has screened medications QTc in SR 443 ms Labs today show creatinine at 0.92, K+ 3.5 and mag 2.1, CrCl calculated at 147 mL/min.   Patient will present to admissions when bed is available.     Lake Bells, PA-C  Afib Clinic [facility] [address] [place], [place] [address] [phone]

## 2023-07-21 NOTE — H&P (Signed)
ADMISSION HISTORY & PHYSICAL  Patient Name: Regina Morales Date of Encounter: 07/21/2023 Primary Care Physician: Loyola Mast, MD Cardiologist: [name], MD  Chief Complaint   Atrial fibrillation  Patient Profile   62 yo female with history of afib, previously on Tikosyn, but stopped due to insurance issues due to cost, now for tikosyn re-loading  HPI   This is a 62 y.o. female with a past medical history significant for hypertension, OSA on CPAP, GERD, DVT/PE, fibromyalgia and atrial fibrillation.  She was previously treated with Tikosyn however her insurance stopped paying for the medication.  Due to cost issues she had stopped the medication.  Subsequently her Apple Watch is indicated several episodes of paroxysmal A-fib.  She feels unwell with this.  She was seen today noted to be in sinus rhythm.  Based on this it was discussed in the A-fib clinic to consider reloading her on Tikosyn 500 mcg twice daily.  Her CHA2DS2-VASc score is 2 and she has been anticoagulated on Eliquis with no missed doses in the past 3 weeks.  Plan today was direct admission for Tikosyn loading.  PMHx   Past Medical History:  Diagnosis Date   Anemia    hx years ago   Anxiety    years ago   Buzzing in ear    "right; even after OR"   Chest pain    a. 2009: neg MV  ([facility])   COPD (chronic obstructive pulmonary disease) (HCC) 2018   Depression    DVT (deep vein thrombosis) in pregnancy 1980's   LLE   Fibromyalgia    "dx'd many many years ago; I haven't had any problems w/it" (04/15/2017)   GERD (gastroesophageal reflux disease)    Hepatic steatosis    High cholesterol    History of blood transfusion    "low count after appendix surgery" unsure # of units transfused   Hypertension    Migraines    "maybe once/2 months" (04/15/2017)   Obesity    OSA on CPAP    Persistent atrial fibrillation (HCC)    a. Dx 07/2013;  b. CHA2DS2VASc=1 (female);  c. 07/2013 Echo:  EF 50-55%, no rwma, mod  LVH.   Pulmonary embolism (HCC) ~ 1984   "after I had my last child"   SVD (spontaneous vaginal delivery)    x 4    Past Surgical History:  Procedure Laterality Date   APPENDECTOMY  1990's   BREAST CYST ASPIRATION Left 2019   @ [facility]   CHOLECYSTECTOMY N/A 09/14/2021   Procedure: LAPAROSCOPIC CHOLECYSTECTOMY WITH INTRAOPERATIVE CHOLANGIOGRAM;  Surgeon: Fritzi Mandes, MD;  Location: [facility] OR;  Service: General;  Laterality: N/A;   COLONOSCOPY     DILATATION & CURETTAGE/HYSTEROSCOPY WITH MYOSURE N/A 04/14/2017   Procedure: DILATATION & CURETTAGE/HYSTEROSCOPY WITH MYOSURE;  Surgeon: Myna Hidalgo, DO;  Location: [facility] ORS;  Service: Gynecology;  Laterality: N/A;  Polypectomy   DILATION AND CURETTAGE OF UTERUS  1983   m[facility]   ERCP N/A 09/15/2021   Procedure: ENDOSCOPIC RETROGRADE CHOLANGIOPANCREATOGRAPHY (ERCP);  Surgeon: Vida Rigger, MD;  Location: [facility] ENDOSCOPY;  Service: Endoscopy;  Laterality: N/A;   INNER EAR SURGERY Right 2000's   LEFT HEART CATH AND CORONARY ANGIOGRAPHY N/A 04/17/2017   Procedure: Left Heart Cath and Coronary Angiography;  Surgeon: Corky Crafts, MD;  Location[facility] INVASIVE CV LAB;  Service: Cardiovascular;  Laterality: N/A;   PLANTAR FASCIA RELEASE Right 07/2015   REMOVAL OF STONES  09/15/2021   Procedure: REMOVAL  OF STONES;  Surgeon: Vida Rigger, MD;  Location: [facility] ENDOSCOPY;  Service: Endoscopy;;   SPHINCTEROTOMY  09/15/2021   Procedure: Dennison Mascot;  Surgeon: Vida Rigger, MD;  Location: [facility] ENDOSCOPY;  Service: Endoscopy;;   TUBAL LIGATION  1985   ULNAR NERVE TRANSPOSITION Right     FAMHx   Family History  Problem Relation Age of Onset   Lung cancer Father    Alcohol abuse Father    Kidney failure Mother        s/p renal Tx   Congestive Heart Failure Mother    Atrial fibrillation Brother    Sudden Cardiac Death Daughter        cardiac arrest   Cardiomyopathy Daughter    Phillips Odor White syndrome Grandchild         granddaughter    SOCHx     reports that she quit smoking about 32 years ago. Her smoking use included cigarettes. She started smoking about 40 years ago. She has a 8 pack-year smoking history. She has never used smokeless tobacco. She reports that she does not currently use alcohol. She reports that she does not use drugs.  Outpatient Medications   No current facility-administered medications on file prior to encounter.   Current Outpatient Medications on File Prior to Encounter  Medication Sig Dispense Refill   acetaminophen (TYLENOL) 500 MG tablet Take 2 tablets (1,000 mg total) by mouth 3 (three) times daily as needed for mild pain. 30 tablet 0   divalproex (DEPAKOTE ER) 250 MG 24 hr tablet Take 250 mg by mouth daily.     DULoxetine (CYMBALTA) 60 MG capsule Take 2 capsules (120 mg total) by mouth daily. (Patient taking differently: Take 60 mg by mouth daily.) 60 capsule 1   ELIQUIS 5 MG TABS tablet TAKE 1 TABLET BY MOUTH TWICE A DAY 180 tablet 3   fluticasone (FLONASE) 50 MCG/ACT nasal spray SPRAY 2 SPRAYS INTO EACH NOSTRIL EVERY DAY (Patient taking differently: as needed. SPRAY 2 SPRAYS INTO EACH NOSTRIL EVERY DAY) 48 mL 3   LATUDA 60 MG TABS Take 2 tablets (120 mg total) by mouth at bedtime. 30 tablet 1   metoprolol tartrate (LOPRESSOR) 50 MG tablet Take 1 tablet (50 mg total) by mouth 2 (two) times daily. 180 tablet 3    Inpatient Medications    Scheduled Meds:  apixaban  5 mg Oral BID   divalproex  250 mg Oral Daily   dofetilide  500 mcg Oral BID   DULoxetine  60 mg Oral Daily   hydrOXYzine  25 mg Oral QHS   Lurasidone HCl  120 mg Oral QHS   metoprolol tartrate  50 mg Oral BID   [START ON 07/22/2023] mirabegron ER  25 mg Oral Daily   sodium chloride flush  3 mL Intravenous Q12H    Continuous Infusions:  sodium chloride      PRN Meds: sodium chloride, sodium chloride flush   ALLERGIES   Allergies  Allergen Reactions   Codeine Nausea And Vomiting, Other (See Comments) and Nausea Only     ROS   Pertinent items noted in HPI and remainder of comprehensive ROS otherwise negative.  Vitals   Vitals:   07/21/23 1717  BP: 131/81  Pulse: 75  Resp: 18  Temp: 98.4 F (36.9 C)  TempSrc: Oral  SpO2: 97%    Intake/Output Summary (Last 24 hours) at 07/21/2023 1848 Last data filed at 07/21/2023 1700 Gross per 24 hour  Intake 240 ml  Output --  Net  240 ml   There were no vitals filed for this visit.  Physical Exam   General appearance: alert and no distress Lungs: clear to auscultation bilaterally Heart: regular rate and rhythm Extremities: extremities normal, atraumatic, no cyanosis or edema Neurologic: Grossly normal  Labs   Results for orders placed or performed during the hospital encounter of 07/21/23 (from the past 48 hour(s))  Basic metabolic panel     Status: Abnormal   Collection Time: 07/21/23 10:55 AM  Result Value Ref Range   Sodium 140 135 - 145 mmol/L   Potassium 3.5 3.5 - 5.1 mmol/L   Chloride 108 98 - 111 mmol/L   CO2 23 22 - 32 mmol/L   Glucose, Bld 120 (H) 70 - 99 mg/dL    Comment: Glucose reference range applies only to samples taken after fasting for at least 8 hours.   BUN 15 6 - 20 mg/dL   Creatinine, Ser 1.61 0.44 - 1.00 mg/dL   Calcium 8.7 (L) 8.9 - 10.3 mg/dL   GFR, Estimated >09 >60 mL/min    Comment: (NOTE) Calculated using the CKD-EPI Creatinine Equation (2021)    Anion gap 9 5 - 15    Comment: Performed at [facility], [address], [place], [place] [address]  Magnesium     Status: None   Collection Time: 07/21/23 10:55 AM  Result Value Ref Range   Magnesium 2.1 1.7 - 2.4 mg/dL    Comment: Performed at [facility], [address], [place], [place] [address]    ECG   Normal sinus rhythm at 67, QTc 448 ms-personally Reviewed  Telemetry   Normal sinus rhythm- Personally Reviewed  Radiology   No results found.  Cardiac Studies   N/A  Assessment   Principal Problem:   Paroxysmal atrial  fibrillation [facility])   Plan   [name] was admitted directly from theA-fib clinic for Tikosyn loading.  She had previously tolerated 500 mcg every 12 hours.  She will need electrolyte repletion per pharmacy protocol prior to initiating therapy.  QTc is 448 ms and normal sinus rhythm based on EKG in the office today.  She has been compliant with Eliquis which we will continue and there were no missed doses in the past 3 weeks.  Cardiac EP to follow going forward.  Time Spent Directly with Patient:  I have spent a total of 30 minutes with patient reviewing hospital notes, telemetry, EKGs, labs and examining the patient as well as establishing an assessment and plan that was discussed with the patient.  > 50% of time was spent in direct patient care.   Length of Stay:  LOS: 0 days   Chrystie Nose, MD, [facility], FACP  [facility]  Medical Director of the Advanced Lipid Disorders &  Cardiovascular Risk Reduction Clinic Diplomate of the American Board of Clinical Lipidology Attending Cardiologist  Direct Dial: [phone]  Fax: [phone]  Website:  [email or web address] 07/21/2023, 6:48 PM

## 2023-07-22 ENCOUNTER — Other Ambulatory Visit (HOSPITAL_COMMUNITY): Payer: Self-pay

## 2023-07-22 DIAGNOSIS — I480 Paroxysmal atrial fibrillation: Secondary | ICD-10-CM | POA: Diagnosis not present

## 2023-07-22 LAB — BASIC METABOLIC PANEL
Anion gap: 11 (ref 5–15)
BUN: 11 mg/dL (ref 6–20)
CO2: 23 mmol/L (ref 22–32)
Calcium: 8.8 mg/dL — ABNORMAL LOW (ref 8.9–10.3)
Chloride: 106 mmol/L (ref 98–111)
Creatinine, Ser: 0.82 mg/dL (ref 0.44–1.00)
GFR, Estimated: >60 mL/min (ref >60)
Glucose, Bld: 97 mg/dL (ref 70–99)
Potassium: 4 mmol/L (ref 3.5–5.1)
Sodium: 140 mmol/L (ref 135–145)

## 2023-07-22 LAB — MAGNESIUM: Magnesium: 2.1 mg/dL (ref 1.7–2.4)

## 2023-07-22 NOTE — Plan of Care (Signed)

## 2023-07-22 NOTE — Progress Notes (Signed)
Pharmacy: Dofetilide (Tikosyn) - Follow Up Assessment and Electrolyte Replacement  Pharmacy consulted to assist in monitoring and replacing electrolytes in this 62 y.o. female admitted on 07/21/2023 undergoing dofetilide initiation. First dofetilide dose: 9/17 @0018   Labs:    Component Value Date/Time   K 4.0 07/22/2023 0421   MG 2.1 07/22/2023 0421     Plan: Potassium: K >/= 4: No additional supplementation needed  Magnesium: Mg > 2: No additional supplementation needed   Thank you for allowing pharmacy to participate in this patient's care   Trixie Rude, PharmD Clinical Pharmacist 07/22/2023  10:12 AM

## 2023-07-22 NOTE — TOC CM/SW Note (Signed)
Transition of Care [facility]) - Inpatient Brief Assessment   Patient Details  Name: Regina Morales MRN: [id number] Date of Birth: April 10, 1961  Transition of Care [facility]) CM/SW Contact:    Gala Lewandowsky, RN Phone Number: 07/22/2023, 9:45 AM   Clinical Narrative: Transition of Care Department [facility]) has reviewed the patient. Patient presented for Tikosyn Load. Benefits check submitted for cost and the cost returned as $4.00. Case Manager will discuss cost and pharmacy of choice as the patient progresses.  Transition of Care Asessment: Insurance and Status: Insurance coverage has been reviewed Patient has primary care physician: Yes Prior/Current Home Services: No current home services Social Determinants of Health Reivew: SDOH reviewed no interventions necessary Readmission risk has been reviewed: Yes Transition of care needs: no transition of care needs at this time

## 2023-07-22 NOTE — Progress Notes (Signed)
Post dose EKG is reviewed with Dr. Elberta Fortis QT shorter then machine read Manually measured by myself, QT , QTc Continue Tikosyn  Francis Dowse, PA-C

## 2023-07-22 NOTE — TOC Benefit Eligibility Note (Signed)
Patient Product/process development scientist completed.    The patient is insured through [facility].     Ran test claim for dofetilide (Tikosyn) 500 mcg and the current 30 day co-pay is $4.00.   This test claim was processed through [facility]- copay amounts may vary at other pharmacies due to pharmacy/plan contracts, or as the patient moves through the different stages of their insurance plan.     Roland Earl, CPHT Pharmacy Technician III Certified Patient Advocate [facility] Patient Advocate Team Direct Number: [phone]  Fax: [phone]

## 2023-07-22 NOTE — Plan of Care (Signed)

## 2023-07-22 NOTE — Progress Notes (Addendum)
Rounding Note    Patient Name: Regina Morales Date of Encounter: 07/22/2023  [facility] Cardiologist: [name], MD   Subjective   Feels well  Inpatient Medications    Scheduled Meds:  apixaban  5 mg Oral BID   divalproex  250 mg Oral Daily   dofetilide  500 mcg Oral BID   DULoxetine  60 mg Oral Daily   hydrOXYzine  25 mg Oral QHS   lurasidone  60 mg Oral QHS   metoprolol tartrate  50 mg Oral BID   mirabegron ER  25 mg Oral Daily   sodium chloride flush  3 mL Intravenous Q12H   Continuous Infusions:  sodium chloride     PRN Meds: sodium chloride, sodium chloride flush   Vital Signs    Vitals:   07/21/23 2023 07/22/23 0020 07/22/23 0446 07/22/23 0955  BP: 117/79  139/75   Pulse: 74 69 60   Resp: 18 18 18 15   Temp: 98.7 F (37.1 C) 98.4 F (36.9 C) 98.4 F (36.9 C) 98.3 F (36.8 C)  TempSrc: Oral Oral Oral Oral  SpO2: 100% 98% 100% 95%    Intake/Output Summary (Last 24 hours) at 07/22/2023 1021 Last data filed at 07/22/2023 0900 Gross per 24 hour  Intake 480 ml  Output 0 ml  Net 480 ml      07/21/2023   11:00 AM 06/19/2023    2:32 PM 06/03/2023   12:22 PM  Last 3 Weights  Weight (lbs) 315 lb 3.2 oz 319 lb 314 lb 9.6 oz  Weight (kg) 142.974 kg 144.697 kg 142.702 kg      Telemetry    Arrived in and maintaining SR, 2 brief tachycardias, 2 NSVTs (monomorphic) - Personally Reviewed, Dr. Elberta Fortis reviewed as well  ECG    SR 61bpm, QTc - Personally Reviewed  Physical Exam   Pt seen/examined by Dr. Elberta Fortis GEN: No acute distress.   Neck: No JVD Cardiac: RRR, no murmurs, rubs, or gallops.  Respiratory: Clear to auscultation bilaterally. GI: Soft, nontender, non-distended  MS: No edema; No deformity. Neuro:  Nonfocal  Psych: Normal affect   Labs    High Sensitivity Troponin:  No results for input(s): "TROPONINIHS" in the last 720 hours.   Chemistry Recent Labs  Lab 07/21/23 1055 07/21/23 2036 07/22/23 0421  NA 140  140 140  K 3.5 3.9 4.0  CL 108 108 106  CO2 23 23 23   GLUCOSE 120* 98 97  BUN 15 12 11   CREATININE 0.92 0.90 0.82  CALCIUM 8.7* 8.8* 8.8*  MG 2.1  --  2.1  GFRNONAA >60 >60 >60  ANIONGAP 9 9 11     Lipids No results for input(s): "CHOL", "TRIG", "HDL", "LABVLDL", "LDLCALC", "CHOLHDL" in the last 168 hours.  HematologyNo results for input(s): "WBC", "RBC", "HGB", "HCT", "MCV", "MCH", "MCHC", "RDW", "PLT" in the last 168 hours. Thyroid No results for input(s): "TSH", "FREET4" in the last 168 hours.  BNPNo results for input(s): "BNP", "PROBNP" in the last 168 hours.  DDimer No results for input(s): "DDIMER" in the last 168 hours.   Radiology    No results found.  Cardiac Studies   06/15/2020: TTE IMPRESSIONS   1. Left ventricular ejection fraction, by estimation, is 55 to 60%. The  left ventricle has normal function. The left ventricle has no regional  wall motion abnormalities. Left ventricular diastolic parameters were  normal.   2. Right ventricular systolic function is normal. The right ventricular  size is  normal. Tricuspid regurgitation signal is inadequate for assessing  PA pressure.   3. The mitral valve is normal in structure. Trivial mitral valve  regurgitation. No evidence of mitral stenosis.   4. The aortic valve is tricuspid. Aortic valve regurgitation is not  visualized. No aortic stenosis is present.   Comparison(s): 04/17/17 EF 55-60%.   Conclusion(s)/Recommendation(s): Normal biventricular function without  evidence of hemodynamically significant valvular heart disease.      04/17/2017: LHC The left ventricular systolic function is normal. LV end diastolic pressure is moderately elevated. LVEDP 25 mm Hg. The left ventricular ejection fraction is 50-55% by visual estimate. There is no aortic valve stenosis. No angiographically apparent coronary artery disease.  Patient Profile     62 y.o. female w/hx of HTN, OSA w/CPAP, Afib, DVT/PE (w/pregnancy), GERD,  fibromyalgia admitted for Tikosyn re-load   Afib hx Diagnosed 2014 Flecainide failed with recurrent arrhythmia Tikosyn started Nov 2019  Assessment & Plan    Paroxysmal Afib CHA2DS2Vasc is 4, on Eliquis Tikosyn re-load is in progress K+ 4.0 Mag 2.1 Creat 0.82 QTc stable  Continue load   HTN Looks OK , no changes   3. OSA Not using CPAP   For questions or updates, please contact [facility] Please consult [email or web address] for contact info under        Signed, Sheilah Pigeon, PA-C  07/22/2023, 10:21 AM    I have seen and examined this patient with Francis Dowse.  Agree with above, note added to reflect my findings.  Patient in sinus rhythm.  Feeling well.  GEN: Well nourished, well developed, in no acute distress  HEENT: normal  Neck: no JVD, carotid bruits, or masses Cardiac: RRR; no murmurs, rubs, or gallops,no edema  Respiratory:  clear to auscultation bilaterally, normal work of breathing GI: soft, nontender, nondistended, + BS MS: no deformity or atrophy  Skin: warm and dry Neuro:  Strength and sensation are intact Psych: euthymic mood, full affect   .  Paroxysmal atrial fibrillation: Dofetilide load in progress.  QTc remained stable.  Continue Eliquis.  Potassium magnesium within normal limits. Hypertension: Currently well-controlled  Weyman Bogdon M. Cassiel Fernandez MD 07/22/2023 10:56 AM

## 2023-07-23 ENCOUNTER — Encounter (HOSPITAL_COMMUNITY)
Admission: AD | Disposition: A | Discharge status: Home / Self Care | Payer: Self-pay | Source: Ambulatory Visit | Attending: Cardiology

## 2023-07-23 DIAGNOSIS — I480 Paroxysmal atrial fibrillation: Secondary | ICD-10-CM | POA: Diagnosis not present

## 2023-07-23 SURGERY — CARDIOVERSION
Anesthesia: General

## 2023-07-23 MED ORDER — POTASSIUM CHLORIDE CRYS ER 20 MEQ PO TBCR
40.0000 meq | EXTENDED_RELEASE_TABLET | Freq: Once | ORAL | Status: AC
  Administered 2023-07-23: 40 meq via ORAL
  Filled 2023-07-23: qty 2

## 2023-07-23 NOTE — Plan of Care (Signed)

## 2023-07-23 NOTE — Progress Notes (Addendum)
Rounding Note    Patient Name: Regina Morales Date of Encounter: 07/23/2023  [facility] Cardiologist: [name], MD   Subjective   Feels well, slept well  Inpatient Medications    Scheduled Meds:  apixaban  5 mg Oral BID   divalproex  250 mg Oral Daily   dofetilide  500 mcg Oral BID   DULoxetine  60 mg Oral Daily   hydrOXYzine  25 mg Oral QHS   lurasidone  60 mg Oral QHS   metoprolol tartrate  50 mg Oral BID   mirabegron ER  25 mg Oral Daily   sodium chloride flush  3 mL Intravenous Q12H   Continuous Infusions:  sodium chloride     PRN Meds: sodium chloride, sodium chloride flush   Vital Signs    Vitals:   07/22/23 1211 07/22/23 2016 07/23/23 0644 07/23/23 1006  BP: 139/76 127/77 (!) 146/79 125/65  Pulse: 71 70 72 87  Resp: 15 17 18    Temp: 98.3 F (36.8 C) 98.7 F (37.1 C) 98.2 F (36.8 C) 98.6 F (37 C)  TempSrc: Oral Oral Oral Oral  SpO2: 95% 95% 96% 98%    Intake/Output Summary (Last 24 hours) at 07/23/2023 1019 Last data filed at 07/23/2023 0930 Gross per 24 hour  Intake 240 ml  Output 0 ml  Net 240 ml      07/21/2023   11:00 AM 06/19/2023    2:32 PM 06/03/2023   12:22 PM  Last 3 Weights  Weight (lbs) 315 lb 3.2 oz 319 lb 314 lb 9.6 oz  Weight (kg) 142.974 kg 144.697 kg 142.702 kg      Telemetry    SR, 70's - Personally Reviewed, Dr. Elberta Fortis reviewed as well  ECG    SR 66bpm, QTc - Personally Reviewed with Dr. Elberta Fortis  Physical Exam   Exam is unchanged GEN: No acute distress.   Neck: No JVD Cardiac: RRR, no murmurs, rubs, or gallops.  Respiratory: Clear to auscultation bilaterally. GI: Soft, nontender, non-distended  MS: No edema; No deformity. Neuro:  Nonfocal  Psych: Normal affect   Labs    High Sensitivity Troponin:  No results for input(s): "TROPONINIHS" in the last 720 hours.   Chemistry Recent Labs  Lab 07/21/23 1055 07/21/23 2036 07/22/23 0421  NA 140 140 140  K 3.5 3.9 4.0  CL 108 108 106   CO2 23 23 23   GLUCOSE 120* 98 97  BUN 15 12 11   CREATININE 0.92 0.90 0.82  CALCIUM 8.7* 8.8* 8.8*  MG 2.1  --  2.1  GFRNONAA >60 >60 >60  ANIONGAP 9 9 11     Lipids No results for input(s): "CHOL", "TRIG", "HDL", "LABVLDL", "LDLCALC", "CHOLHDL" in the last 168 hours.  HematologyNo results for input(s): "WBC", "RBC", "HGB", "HCT", "MCV", "MCH", "MCHC", "RDW", "PLT" in the last 168 hours. Thyroid No results for input(s): "TSH", "FREET4" in the last 168 hours.  BNPNo results for input(s): "BNP", "PROBNP" in the last 168 hours.  DDimer No results for input(s): "DDIMER" in the last 168 hours.   Radiology    No results found.  Cardiac Studies   06/15/2020: TTE IMPRESSIONS   1. Left ventricular ejection fraction, by estimation, is 55 to 60%. The  left ventricle has normal function. The left ventricle has no regional  wall motion abnormalities. Left ventricular diastolic parameters were  normal.   2. Right ventricular systolic function is normal. The right ventricular  size is normal. Tricuspid regurgitation signal is  inadequate for assessing  PA pressure.   3. The mitral valve is normal in structure. Trivial mitral valve  regurgitation. No evidence of mitral stenosis.   4. The aortic valve is tricuspid. Aortic valve regurgitation is not  visualized. No aortic stenosis is present.   Comparison(s): 04/17/17 EF 55-60%.   Conclusion(s)/Recommendation(s): Normal biventricular function without  evidence of hemodynamically significant valvular heart disease.      04/17/2017: LHC The left ventricular systolic function is normal. LV end diastolic pressure is moderately elevated. LVEDP 25 mm Hg. The left ventricular ejection fraction is 50-55% by visual estimate. There is no aortic valve stenosis. No angiographically apparent coronary artery disease.  Patient Profile     62 y.o. female w/hx of HTN, OSA w/CPAP, Afib, DVT/PE (w/pregnancy), GERD, fibromyalgia admitted for Tikosyn re-load    Afib hx Diagnosed 2014 Flecainide failed with recurrent arrhythmia Tikosyn started Nov 2019  Assessment & Plan    Paroxysmal Afib CHA2DS2Vasc is 4, on Eliquis Tikosyn re-load is in progress Labs pending QTc stable  Continue load Anticipated discharge tomorrow   HTN Looks OK , no changes   3. OSA Not using CPAP  4. Morbid obesity: lifestyle modification encouraged  For questions or updates, please contact [facility] Please consult [email or web address] for contact info under        Signed, Sheilah Pigeon, PA-C  07/23/2023, 10:19 AM    I have seen and examined this patient with Francis Dowse.  Agree with above, note added to reflect my findings.  Remains in sinus rhythm.  No acute complaints.  GEN: Well nourished, well developed, in no acute distress  HEENT: normal  Neck: no JVD, carotid bruits, or masses Cardiac: RRR; no murmurs, rubs, or gallops,no edema  Respiratory:  clear to auscultation bilaterally, normal work of breathing GI: soft, nontender, nondistended, + BS MS: no deformity or atrophy  Skin: warm and dry Neuro:  Strength and sensation are intact Psych: euthymic mood, full affect   Paroxysmal atrial fibrillation: Dofetilide load in progress.  Patient remains in sinus rhythm.  QTc remained stable.  [name] continue with current dose.  Likely discharge tomorrow. Hypertension: Currently well-controlled  [name] M. Hien Cunliffe MD 07/23/2023 12:14 PM

## 2023-07-23 NOTE — Progress Notes (Addendum)
Post dose EKG is reviewed, QTc measured by myself is shorter then machine read QTc stable Continue Tikosyn  Labs back Potassium replacement ordered  Francis Dowse, PA-C

## 2023-07-23 NOTE — Progress Notes (Signed)
Pharmacy: Dofetilide (Tikosyn) - Follow Up Assessment and Electrolyte Replacement  Pharmacy consulted to assist in monitoring and replacing electrolytes in this 62 y.o. female admitted on 07/21/2023 undergoing dofetilide initiation.   Labs:    Component Value Date/Time   K 4.0 07/22/2023 0421   MG 2.1 07/22/2023 0421     Plan: Potassium: K >/= 4: No additional supplementation needed  Magnesium: Mg > 2: No additional supplementation needed   She required a total of potassium on 9/16 and may need a daily replacement. May need to consider Kdur daily at discharge  Thank you for allowing pharmacy to participate in this patient's care   Harland German, PharmD Clinical Pharmacist **Pharmacist phone directory can now be found on [email or web address] (PW [email or web address]).  Listed under [facility].

## 2023-07-23 NOTE — Care Management (Addendum)
07-23-23 1520 Patient presented for Tikosyn Load. Case Manager spoke with the patient regarding co pay cost. Patient is agreeable to cost and would like to have the initial Rx filled via [facility] and the Rx refills 90 day supply escribed to [facility] [address] No further needs identified at this time.

## 2023-07-24 ENCOUNTER — Other Ambulatory Visit (HOSPITAL_COMMUNITY): Payer: Self-pay

## 2023-07-24 DIAGNOSIS — I480 Paroxysmal atrial fibrillation: Secondary | ICD-10-CM | POA: Diagnosis not present

## 2023-07-24 LAB — BASIC METABOLIC PANEL
Anion gap: 11 (ref 5–15)
BUN: 16 mg/dL (ref 6–20)
CO2: 23 mmol/L (ref 22–32)
Calcium: 9 mg/dL (ref 8.9–10.3)
Chloride: 104 mmol/L (ref 98–111)
Creatinine, Ser: 0.9 mg/dL (ref 0.44–1.00)
GFR, Estimated: >60 mL/min (ref >60)
Glucose, Bld: 92 mg/dL (ref 70–99)
Potassium: 4 mmol/L (ref 3.5–5.1)
Sodium: 138 mmol/L (ref 135–145)

## 2023-07-24 LAB — MAGNESIUM: Magnesium: 2.1 mg/dL (ref 1.7–2.4)

## 2023-07-24 MED ORDER — DOFETILIDE 500 MCG PO CAPS
500.0000 ug | ORAL_CAPSULE | Freq: Two times a day (BID) | ORAL | 5 refills | Status: DC
Start: 2023-07-24 — End: 2023-09-18
  Filled 2023-07-24: qty 60, 30d supply, fill #0

## 2023-07-24 MED ORDER — POTASSIUM CHLORIDE CRYS ER 20 MEQ PO TBCR
20.0000 meq | EXTENDED_RELEASE_TABLET | Freq: Every day | ORAL | 5 refills | Status: DC
  Filled 2023-07-24: qty 30, 30d supply, fill #0

## 2023-07-24 NOTE — Discharge Summary (Addendum)
ELECTROPHYSIOLOGY PROCEDURE DISCHARGE SUMMARY    Patient ID: Regina Morales,  MRN: [id number], DOB/AGE: [birth year] 62 y.o.  Admit date: 07/21/2023 Discharge date: 07/24/2023  Primary Care Physician: Loyola Mast, MD  Primary Cardiologist: Dr. Eden Emms Electrophysiologist: Dr. Elberta Fortis  Primary Discharge Diagnosis:  1.  paroxysmal atrial fibrillation status post Tikosyn loading this admission      CHA2DS2Vasc is 4, on Eliquis  Secondary Discharge Diagnosis:  HTN OSA Intol of CPAP  Allergies  Allergen Reactions   Codeine Nausea And Vomiting, Other (See Comments) and Nausea Only     Procedures This Admission:  1.  Tikosyn loading   Brief HPI: Regina Morales is a 62 y.o. female with a past medical history as noted above.  They were referred to EP in the outpatient setting for treatment options of atrial fibrillation.  Risks, benefits, and alternatives to Tikosyn were reviewed with the patient who wished to proceed.    Hospital Course:  The patient was admitted and Tikosyn was initiated.  Renal function and electrolytes were followed during the hospitalization.   The patient's QTc remained stable.  She arrived in and maintained SR   She was monitored until discharge on telemetry which demonstrated SR.  On the day of discharge, she feels well, was examined by Dr Elberta Fortis who considered the patient stable for discharge to home.  Follow-up has been arranged with the AFib clinic in 1 week and with EP team in 4 weeks.   Tikosyn teaching was completed  electrolyte replacement for home Lateisha Thurlow be K+ daily  Physical Exam: Vitals:   07/23/23 1006 07/23/23 2014 07/24/23 0358 07/24/23 1319  BP: 125/65 128/73 116/74   Pulse: 87 79 69 71  Resp:  18 18 17   Temp: 98.6 F (37 C) 99.3 F (37.4 C) 98.4 F (36.9 C) 98.2 F (36.8 C)  TempSrc: Oral Oral Oral Oral  SpO2: 98% 97% 99%      GEN- The patient is well appearing, alert and oriented x 3 today.   HEENT:  normocephalic, atraumatic; sclera clear, conjunctiva pink; hearing intact; oropharynx clear; neck supple, no JVP Lymph- no cervical lymphadenopathy Lungs- CTA b/l, normal work of breathing.  No wheezes, rales, rhonchi Heart- RRR, no murmurs, rubs or gallops, PMI not laterally displaced GI- soft, non-tender, non-distended Extremities- no clubbing, cyanosis, or edema MS- no significant deformity or atrophy Skin- warm and dry, no rash or lesion Psych- euthymic mood, full affect Neuro- strength and sensation are intact   Labs:   Lab Results  Component Value Date   WBC 11.1 (H) 03/15/2023   HGB 12.1 03/15/2023   HCT 37.7 03/15/2023   MCV 86.3 03/15/2023   PLT 311 03/15/2023    Recent Labs  Lab 07/24/23 0352  NA 138  K 4.0  CL 104  CO2 23  BUN 16  CREATININE 0.90  CALCIUM 9.0  GLUCOSE 92     Discharge Medications:  Allergies as of 07/24/2023       Reactions   Codeine Nausea And Vomiting, Other (See Comments), Nausea Only        Medication List     TAKE these medications    acetaminophen 500 MG tablet Commonly known as: TYLENOL Take 2 tablets (1,000 mg total) by mouth 3 (three) times daily as needed for mild pain.   divalproex 250 MG 24 hr tablet Commonly known as: DEPAKOTE ER Take 250 mg by mouth daily.   dofetilide 500 MCG capsule Commonly known as:  TIKOSYN Take 1 capsule (500 mcg total) by mouth 2 (two) times daily.   DULoxetine 60 MG capsule Commonly known as: CYMBALTA Take 2 capsules (120 mg total) by mouth daily. What changed: how much to take   Eliquis 5 MG Tabs tablet Generic drug: apixaban TAKE 1 TABLET BY MOUTH TWICE A DAY   fluticasone 50 MCG/ACT nasal spray Commonly known as: FLONASE SPRAY 2 SPRAYS INTO EACH NOSTRIL EVERY DAY What changed:  when to take this reasons to take this   Gemtesa 75 MG Tabs Generic drug: Vibegron Take 1 tablet by mouth daily.   hydrOXYzine 25 MG capsule Commonly known as: VISTARIL Take 25 mg by mouth at  bedtime.   Latuda 60 MG Tabs Generic drug: Lurasidone HCl Take 2 tablets (120 mg total) by mouth at bedtime. What changed: how much to take   metoprolol tartrate 50 MG tablet Commonly known as: LOPRESSOR Take 1 tablet (50 mg total) by mouth 2 (two) times daily.   potassium chloride SA 20 MEQ tablet Commonly known as: KLOR-CON M Take 1 tablet (20 mEq total) by mouth daily.        Disposition: Home Discharge Instructions     Diet - low sodium heart healthy   Complete by: As directed    Increase activity slowly   Complete by: As directed         Duration of Discharge Encounter: Greater than 30 minutes including physician time.  Signed, Francis Dowse, PA-C 07/24/2023 1:50 PM  I have seen and examined this patient with Francis Dowse.  Agree with above, note added to reflect my findings.  Patient mid to the hospital for dofetilide load.  QTc remained stable.  Was admitted in sinus rhythm.  Patient feeling well today.  Lakiya Cottam discharge with follow-up in clinic.  GEN: Well nourished, well developed, in no acute distress  HEENT: normal  Neck: no JVD, carotid bruits, or masses Cardiac: RRR; no murmurs, rubs, or gallops,no edema  Respiratory:  clear to auscultation bilaterally, normal work of breathing GI: soft, nontender, nondistended, + BS MS: no deformity or atrophy  Skin: warm and dry Neuro:  Strength and sensation are intact Psych: euthymic mood, full affect      Theora Vankirk M. Lavella Myren MD 07/24/2023 4:06 PM

## 2023-07-24 NOTE — Plan of Care (Signed)
  Problem: Education: Goal: Knowledge of General Education information will improve Description: Including pain rating scale, medication(s)/side effects and non-pharmacologic comfort measures 07/24/2023 1413 by Herma Carson, RN Outcome: Adequate for Discharge 07/24/2023 0754 by Herma Carson, RN Outcome: Progressing   Problem: Health Behavior/Discharge Planning: Goal: Ability to manage health-related needs will improve 07/24/2023 1413 by Herma Carson, RN Outcome: Adequate for Discharge 07/24/2023 0754 by Herma Carson, RN Outcome: Progressing   Problem: Clinical Measurements: Goal: Ability to maintain clinical measurements within normal limits will improve 07/24/2023 1413 by Herma Carson, RN Outcome: Adequate for Discharge 07/24/2023 0754 by Herma Carson, RN Outcome: Progressing Goal: Will remain free from infection 07/24/2023 1413 by Herma Carson, RN Outcome: Adequate for Discharge 07/24/2023 0754 by Herma Carson, RN Outcome: Progressing Goal: Diagnostic test results will improve 07/24/2023 1413 by Herma Carson, RN Outcome: Adequate for Discharge 07/24/2023 0754 by Herma Carson, RN Outcome: Progressing Goal: Respiratory complications will improve 07/24/2023 1413 by Herma Carson, RN Outcome: Adequate for Discharge 07/24/2023 0754 by Herma Carson, RN Outcome: Progressing Goal: Cardiovascular complication will be avoided 07/24/2023 1413 by Herma Carson, RN Outcome: Adequate for Discharge 07/24/2023 0754 by Herma Carson, RN Outcome: Progressing   Problem: Activity: Goal: Risk for activity intolerance will decrease 07/24/2023 1413 by Herma Carson, RN Outcome: Adequate for Discharge 07/24/2023 0754 by Herma Carson, RN Outcome: Progressing   Problem: Nutrition: Goal: Adequate nutrition will be maintained 07/24/2023 1413 by Herma Carson, RN Outcome: Adequate for Discharge 07/24/2023 0754 by Herma Carson, RN Outcome: Progressing    Problem: Coping: Goal: Level of anxiety will decrease 07/24/2023 1413 by Herma Carson, RN Outcome: Adequate for Discharge 07/24/2023 0754 by Herma Carson, RN Outcome: Progressing   Problem: Elimination: Goal: Will not experience complications related to bowel motility 07/24/2023 1413 by Herma Carson, RN Outcome: Adequate for Discharge 07/24/2023 0754 by Herma Carson, RN Outcome: Progressing Goal: Will not experience complications related to urinary retention 07/24/2023 1413 by Herma Carson, RN Outcome: Adequate for Discharge 07/24/2023 0754 by Herma Carson, RN Outcome: Progressing   Problem: Pain Managment: Goal: General experience of comfort will improve 07/24/2023 1413 by Herma Carson, RN Outcome: Adequate for Discharge 07/24/2023 0754 by Herma Carson, RN Outcome: Progressing   Problem: Safety: Goal: Ability to remain free from injury will improve 07/24/2023 1413 by Herma Carson, RN Outcome: Adequate for Discharge 07/24/2023 0754 by Herma Carson, RN Outcome: Progressing   Problem: Skin Integrity: Goal: Risk for impaired skin integrity will decrease 07/24/2023 1413 by Herma Carson, RN Outcome: Adequate for Discharge 07/24/2023 0754 by Herma Carson, RN Outcome: Progressing

## 2023-07-24 NOTE — Plan of Care (Signed)

## 2023-07-24 NOTE — Progress Notes (Signed)
Pharmacy: Dofetilide (Tikosyn) - Follow Up Assessment and Electrolyte Replacement  Pharmacy consulted to assist in monitoring and replacing electrolytes in this 62 y.o. female admitted on 07/21/2023 undergoing dofetilide initiation.   Labs:    Component Value Date/Time   K 4.0 07/24/2023 0352   MG 2.1 07/24/2023 0352     Plan: Potassium: K >/= 4: No additional supplementation needed  Magnesium: Mg > 2: No additional supplementation needed   As patient has required on average 35 mEq of potassium replacement every day, recommend discharging patient with prescription for:  Potassium chloride 20 mEq  daily  Thank you for allowing pharmacy to participate in this patient's care   Harland German, PharmD Clinical Pharmacist **Pharmacist phone directory can now be found on [email or web address] ([email or web address]).  Listed under [facility].

## 2023-07-25 ENCOUNTER — Telehealth: Payer: Self-pay

## 2023-07-25 NOTE — Transitions of Care (Post Inpatient/ED Visit) (Signed)
07/25/2023  Name: Nikhita A [place] MRN: [id number] DOB: 23-May-1961  Today's TOC FU Call Status: Today's TOC FU Call Status:: Unsuccessful Call (1st Attempt) Unsuccessful Call (1st Attempt) Date: 07/25/23  Attempted to reach the patient regarding the most recent Inpatient/ED visit.  Follow Up Plan: Additional outreach attempts will be made to reach the patient to complete the Transitions of Care (Post Inpatient/ED visit) call.   Signature Arvil Persons, BSN, Charity fundraiser

## 2023-07-30 ENCOUNTER — Encounter (HOSPITAL_COMMUNITY): Payer: Self-pay

## 2023-07-31 ENCOUNTER — Ambulatory Visit (HOSPITAL_COMMUNITY): Payer: Medicaid Other | Admitting: Physician Assistant

## 2023-07-31 NOTE — Transitions of Care (Post Inpatient/ED Visit) (Signed)
07/31/2023  Name: Regina Morales MRN: [id number] DOB: May 10, 1961  Today's TOC FU Call Status: Today's TOC FU Call Status:: Unsuccessful Call (2nd Attempt) Unsuccessful Call (1st Attempt) Date: 07/25/23 Unsuccessful Call (2nd Attempt) Date: 07/31/23  Attempted to reach the patient regarding the most recent Inpatient/ED visit.  Follow Up Plan: Additional outreach attempts will be made to reach the patient to complete the Transitions of Care (Post Inpatient/ED visit) call.   Signature Arvil Persons, BSN, Charity fundraiser

## 2023-08-07 ENCOUNTER — Ambulatory Visit (HOSPITAL_COMMUNITY): Payer: Medicaid Other | Admitting: Internal Medicine

## 2023-08-07 ENCOUNTER — Encounter (HOSPITAL_COMMUNITY): Payer: Self-pay

## 2023-08-26 NOTE — Progress Notes (Deleted)
Cardiology Office Note Date:  08/26/2023  Patient ID:  Montrice A [place], DOB [birth year], MRN [id number] PCP:  Loyola Mast, MD  Cardiologist:  Dr. Eden Emms Electrophysiologist: Dr. Elberta Fortis    Chief Complaint:   *** post hospital  History of Present Illness: Janeice A [place] is a 62 y.o. female with history of HTN, OSA w/CPAP, Afib, DVT/PE (w/pregnancy), GERD, fibromyalgia.   I saw her 06/10/22 She is doing pretty well. Post lap-chole she struggled with GI issues and diarrhea, but the lat medication alosetron has done well. Her depakote dose has been reduced and her tremor improved with plans to taper her off all together. She notes by her watch and palpitations AFib with rates towards 130's, these are fairly infrequent and last a minute or so only. Outside of her watch alert and palpitations do not cause any other symptoms. No SOB No CP No near syncope or syncope. No bleeding or signs of bleeding No changes were made.  She has seen the Afib clinic a couple times since then In the interim, she lost insurance and could not get the Tikosyn and came off it.  Subsequently has gotten coverage and having symptomatic episodes of AFib Was admitted 07/21/23 with roe Tikosyn re-loaded   She no-showed and canceled AFib clinic post load visits  *** Tikosyn EKG, labs, med list, teaching *** visit compliance *** eliquis, dose, labs, bleeding  Afib hx Diagnosed 2014 Flecainide failed with recurrent arrhythmia Tikosyn started Nov 2019  Past Medical History:  Diagnosis Date   Anemia    hx years ago   Anxiety    years ago   Buzzing in ear    "right; even after OR"   Chest pain    a. 2009: neg MV  ([facility])   COPD (chronic obstructive pulmonary disease) (HCC) 2018   Depression    DVT (deep vein thrombosis) in pregnancy 1980's   LLE   Fibromyalgia    "dx'd many many years ago; I haven't had any problems w/it" (04/15/2017)   GERD (gastroesophageal reflux disease)    Hepatic  steatosis    High cholesterol    History of blood transfusion    "low count after appendix surgery" unsure # of units transfused   Hypertension    Migraines    "maybe once/2 months" (04/15/2017)   Obesity    OSA on CPAP    Persistent atrial fibrillation (HCC)    a. Dx 07/2013;  b. CHA2DS2VASc=1 (female);  c. 07/2013 Echo:  EF 50-55%, no rwma, mod LVH.   Pulmonary embolism (HCC) ~ 1984   "after I had my last child"   SVD (spontaneous vaginal delivery)    x 4    Past Surgical History:  Procedure Laterality Date   APPENDECTOMY  1990's   BREAST CYST ASPIRATION Left 2019   @ [facility]   CHOLECYSTECTOMY N/A 09/14/2021   Procedure: LAPAROSCOPIC CHOLECYSTECTOMY WITH INTRAOPERATIVE CHOLANGIOGRAM;  Surgeon: Fritzi Mandes, MD;  Location: [facility] OR;  Service: General;  Laterality: N/A;   COLONOSCOPY     DILATATION & CURETTAGE/HYSTEROSCOPY WITH MYOSURE N/A 04/14/2017   Procedure: DILATATION & CURETTAGE/HYSTEROSCOPY WITH MYOSURE;  Surgeon: Myna Hidalgo, DO;  Location: [facility] ORS;  Service: Gynecology;  Laterality: N/A;  Polypectomy   DILATION AND CURETTAGE OF UTERUS  1983   m[facility]   ERCP N/A 09/15/2021   Procedure: ENDOSCOPIC RETROGRADE CHOLANGIOPANCREATOGRAPHY (ERCP);  Surgeon: Vida Rigger, MD;  Location: [facility] ENDOSCOPY;  Service: Endoscopy;  Laterality: N/A;   INNER EAR SURGERY Right 2000's  LEFT HEART CATH AND CORONARY ANGIOGRAPHY N/A 04/17/2017   Procedure: Left Heart Cath and Coronary Angiography;  Surgeon: Corky Crafts, MD;  Location: [facility] INVASIVE CV LAB;  Service: Cardiovascular;  Laterality: N/A;   PLANTAR FASCIA RELEASE Right 07/2015   REMOVAL OF STONES  09/15/2021   Procedure: REMOVAL OF STONES;  Surgeon: Vida Rigger, MD;  Location: [facility] ENDOSCOPY;  Service: Endoscopy;;   SPHINCTEROTOMY  09/15/2021   Procedure: Dennison Mascot;  Surgeon: Vida Rigger, MD;  Location: [facility] ENDOSCOPY;  Service: Endoscopy;;   TUBAL LIGATION  1985   ULNAR NERVE TRANSPOSITION Right     Current Outpatient  Medications  Medication Sig Dispense Refill   acetaminophen (TYLENOL) 500 MG tablet Take 2 tablets (1,000 mg total) by mouth 3 (three) times daily as needed for mild pain. 30 tablet 0   divalproex (DEPAKOTE ER) 250 MG 24 hr tablet Take 250 mg by mouth daily.     dofetilide (TIKOSYN) 500 MCG capsule Take 1 capsule (500 mcg total) by mouth 2 (two) times daily. 60 capsule 5   DULoxetine (CYMBALTA) 60 MG capsule Take 2 capsules (120 mg total) by mouth daily. (Patient taking differently: Take 60 mg by mouth daily.) 60 capsule 1   ELIQUIS 5 MG TABS tablet TAKE 1 TABLET BY MOUTH TWICE A DAY 180 tablet 3   fluticasone (FLONASE) 50 MCG/ACT nasal spray SPRAY 2 SPRAYS INTO EACH NOSTRIL EVERY DAY (Patient taking differently: as needed. SPRAY 2 SPRAYS INTO EACH NOSTRIL EVERY DAY) 48 mL 3   GEMTESA 75 MG TABS Take 1 tablet by mouth daily.     hydrOXYzine (VISTARIL) 25 MG capsule Take 25 mg by mouth at bedtime.     LATUDA 60 MG TABS Take 2 tablets (120 mg total) by mouth at bedtime. (Patient taking differently: Take 60 mg by mouth at bedtime.) 30 tablet 1   metoprolol tartrate (LOPRESSOR) 50 MG tablet Take 1 tablet (50 mg total) by mouth 2 (two) times daily. 180 tablet 3   potassium chloride SA (KLOR-CON M) 20 MEQ tablet Take 1 tablet (20 mEq total) by mouth daily. 30 tablet 5   No current facility-administered medications for this visit.    Allergies:   Codeine   Social History:  The patient  reports that she quit smoking about 32 years ago. Her smoking use included cigarettes. She started smoking about 40 years ago. She has a 8 pack-year smoking history. She has never used smokeless tobacco. She reports that she does not currently use alcohol. She reports that she does not use drugs.   Family History:  The patient's family history includes Alcohol abuse in her father; Atrial fibrillation in her brother; Cardiomyopathy in her daughter; Congestive Heart Failure in her mother; Kidney failure in her mother; Lung  cancer in her father; Sudden Cardiac Death in her daughter; Wolff Parkinson White syndrome in her grandchild.  ROS:  Please see the history of present illness.    All other systems are reviewed and otherwise negative.   PHYSICAL EXAM:  VS:  There were no vitals taken for this visit. BMI: There is no height or weight on file to calculate BMI. Well nourished, well developed, in no acute distress HEENT: normocephalic, atraumatic Neck: no JVD, carotid bruits or masses Cardiac: *** RRR; no significant murmurs, no rubs, or gallops Lungs: *** CTA b/l, no wheezing, rhonchi or rales Abd: not examined today (post-op) MS: no deformity or atrophy Ext: *** no edema Skin: warm and dry, no rash Neuro:  No gross  deficits appreciated, termor is known for her Psych: euthymic mood, full affect   EKG:  Done today and reviewed by myself shows  ***   06/15/2020: TTE IMPRESSIONS   1. Left ventricular ejection fraction, by estimation, is 55 to 60%. The  left ventricle has normal function. The left ventricle has no regional  wall motion abnormalities. Left ventricular diastolic parameters were  normal.   2. Right ventricular systolic function is normal. The right ventricular  size is normal. Tricuspid regurgitation signal is inadequate for assessing  PA pressure.   3. The mitral valve is normal in structure. Trivial mitral valve  regurgitation. No evidence of mitral stenosis.   4. The aortic valve is tricuspid. Aortic valve regurgitation is not  visualized. No aortic stenosis is present.   Comparison(s): 04/17/17 EF 55-60%.   Conclusion(s)/Recommendation(s): Normal biventricular function without  evidence of hemodynamically significant valvular heart disease.    04/17/2017: LHC The left ventricular systolic function is normal. LV end diastolic pressure is moderately elevated. LVEDP 25 mm Hg. The left ventricular ejection fraction is 50-55% by visual estimate. There is no aortic valve  stenosis. No angiographically apparent coronary artery disease.   Recent Labs: 03/15/2023: Hemoglobin 12.1; Platelets 311 07/24/2023: BUN 16; Creatinine, Ser 0.90; Magnesium 2.1; Potassium 4.0; Sodium 138  No results found for requested labs within last 365 days.   CrCl cannot be calculated (Patient's most recent lab result is older than the maximum 21 days allowed.).   Wt Readings from Last 3 Encounters:  07/21/23 (!) 315 lb 3.2 oz (143 kg)  06/19/23 (!) 319 lb (144.7 kg)  06/03/23 (!) 314 lb 9.6 oz (142.7 kg)     Other studies reviewed: Additional studies/records reviewed today include: summarized above  ASSESSMENT AND PLAN:  Paroxysmal Afib CHA2DS2Vasc is 4, on Eliquis, *** appropriately dosed *** Minimal burden on Tikosyn *** QTc is stable *** meds  reviewed *** Teaching re-enforced today and cautioned on new meds to always confirm with the pharmcist or [place] to make sure is OK with her dofetilide  She is chronically on the Latuda, *** QT looks ok   HTN *** Looks OK , no changes  3. Tremor *** Better on lower Depakote dose with plans to wean off C/w psychiatry/neurology   Disposition: ***  Current medicines are reviewed at length with the patient today.  The patient did not have any concerns regarding medicines.  Norma Fredrickson, PA-C 08/26/2023 1:20 PM     [facility] [address] [place] [address] [phone] (office)  [phone] (fax)

## 2023-08-28 ENCOUNTER — Ambulatory Visit: Payer: Medicaid Other | Admitting: Physician Assistant

## 2023-08-28 ENCOUNTER — Telehealth: Payer: Self-pay | Admitting: *Deleted

## 2023-08-28 NOTE — Telephone Encounter (Signed)
Pt missed her appointment today, she was a f/u from Tikosyn load.  After viewing chart, noticed pt missed appt with A-Fib clinic as well.  Call placed to pt for a well-check, pt states she overslept the day she was supposed to see A-Fib Clinic and her car wouldn't start today.  Pt was reminded the importance of following up after Tikosyn load, so we can check her levels.  Pt asked to reschedule so sent EP Scheduling department a message to call pt.

## 2023-09-01 NOTE — Transitions of Care (Post Inpatient/ED Visit) (Signed)
09/01/2023  Name: Regina Morales MRN: [id number] DOB: [birth year]  Today's TOC FU Call Status: Today's TOC FU Call Status:: Unsuccessful Call (2nd Attempt) Unsuccessful Call (1st Attempt) Date: 07/25/23 Unsuccessful Call (2nd Attempt) Date: 07/31/23  Attempted to reach the patient regarding the most recent Inpatient/ED visit.  Follow Up Plan: No further outreach attempts will be made at this time. We have been unable to contact the patient.  Signature Arvil Persons, BSN, Charity fundraiser

## 2023-09-08 ENCOUNTER — Encounter (HOSPITAL_COMMUNITY): Payer: Self-pay

## 2023-09-08 ENCOUNTER — Inpatient Hospital Stay (HOSPITAL_COMMUNITY): Admission: RE | Admit: 2023-09-08 | Payer: Medicaid Other | Source: Ambulatory Visit | Admitting: Internal Medicine

## 2023-09-18 ENCOUNTER — Other Ambulatory Visit (HOSPITAL_COMMUNITY): Payer: Self-pay

## 2023-09-18 ENCOUNTER — Ambulatory Visit (HOSPITAL_COMMUNITY): Payer: Medicaid Other | Admitting: Internal Medicine

## 2023-09-18 ENCOUNTER — Ambulatory Visit (HOSPITAL_COMMUNITY)
Admission: RE | Admit: 2023-09-18 | Discharge: 2023-09-18 | Disposition: A | Discharge status: Home / Self Care | Payer: Medicaid Other | Source: Ambulatory Visit | Attending: Internal Medicine | Admitting: Internal Medicine

## 2023-09-18 VITALS — BP 122/78 | HR 81 | Ht 67.0 in | Wt 319.8 lb

## 2023-09-18 DIAGNOSIS — Z79899 Other long term (current) drug therapy: Secondary | ICD-10-CM | POA: Insufficient documentation

## 2023-09-18 DIAGNOSIS — I480 Paroxysmal atrial fibrillation: Secondary | ICD-10-CM | POA: Diagnosis present

## 2023-09-18 DIAGNOSIS — I110 Hypertensive heart disease with heart failure: Secondary | ICD-10-CM | POA: Diagnosis not present

## 2023-09-18 DIAGNOSIS — I509 Heart failure, unspecified: Secondary | ICD-10-CM | POA: Insufficient documentation

## 2023-09-18 DIAGNOSIS — I4891 Unspecified atrial fibrillation: Secondary | ICD-10-CM

## 2023-09-18 DIAGNOSIS — Z5181 Encounter for therapeutic drug level monitoring: Secondary | ICD-10-CM | POA: Diagnosis not present

## 2023-09-18 LAB — BASIC METABOLIC PANEL
Anion gap: 7 (ref 5–15)
BUN: 14 mg/dL (ref 6–20)
CO2: 24 mmol/L (ref 22–32)
Calcium: 8.8 mg/dL — ABNORMAL LOW (ref 8.9–10.3)
Chloride: 110 mmol/L (ref 98–111)
Creatinine, Ser: 0.78 mg/dL (ref 0.44–1.00)
GFR, Estimated: >60 mL/min (ref >60)
Glucose, Bld: 122 mg/dL — ABNORMAL HIGH (ref 70–99)
Potassium: 4.4 mmol/L (ref 3.5–5.1)
Sodium: 141 mmol/L (ref 135–145)

## 2023-09-18 LAB — MAGNESIUM: Magnesium: 2.3 mg/dL (ref 1.7–2.4)

## 2023-09-18 MED ORDER — DOFETILIDE 500 MCG PO CAPS
500.0000 ug | ORAL_CAPSULE | Freq: Two times a day (BID) | ORAL | 0 refills | Status: DC
  Filled 2023-09-18: qty 60, 30d supply, fill #0

## 2023-09-18 NOTE — Addendum Note (Signed)
Encounter addended by: Shona Simpson, RN on: 09/18/2023 4:03 PM  Actions taken: Pharmacy for encounter modified, Order list changed

## 2023-09-18 NOTE — Progress Notes (Addendum)
Primary Care Physician: Loyola Mast, MD Primary Cardiologist: [name], MD Electrophysiologist: Will Jorja Loa, MD     Referring Physician: Dr. Camnitz     Regina Morales is a 62 y.o. female with a history of HTN, OSA on CPAP, DVT/PE, GERD, fibromyalgia, and atrial fibrillation who presents for consultation in the [facility]. Patient previously on Tikosyn but during visit with cardiology on 7/30 noted her insurance stopped paying for Tikosyn 6 months prior. She has been having episodes of Afib several times a week. Patient is on Eliquis for a CHADS2VASC score of 2.  On evaluation today, she is currently in NSR. She does admit that her Apple Watch has been confirming paroxysmal episodes of Afib. She feels like she cannot swallow and heart races when in Afib. Episodes are lasting for a few hours at a time. She stopped Tikosyn around April of this year due to being told it was going to cost her $1,500 a month. No missed doses or issues with Eliquis.   On follow up 07/21/23, patient is here for restart of Tikosyn admission. She was previously on Tikosyn 500 mcg BID. No new medications since last OV. No benadryl use. No missed doses of anticoagulant.   On follow up 09/18/23, she is currently in NSR. S/p Tikosyn admission 9/16-19/24. She did not require cardioversion and discharged on 500 mcg dosage. She has had no episodes of Afib since hospital discharge. Patient tells me she has missed several appointments due to oversleeping secondary to work schedule of second shift. She has missed the past two evening doses due to having missed multiple appointments and about to run out of Tikosyn.  Today, she denies symptoms of palpitations, chest pain, shortness of breath, orthopnea, PND, lower extremity edema, dizziness, presyncope, syncope, snoring, daytime somnolence, bleeding, or neurologic sequela. The patient is tolerating medications without difficulties and  is otherwise without complaint today.    Atrial Fibrillation Risk Factors:  she does have symptoms or diagnosis of sleep apnea. she is compliant with CPAP therapy.  she has a BMI of Body mass index is 50.09 kg/m.Marland Kitchen Filed Weights   09/18/23 1445  Weight: (!) 145.1 kg     Current Outpatient Medications  Medication Sig Dispense Refill   acetaminophen (TYLENOL) 500 MG tablet Take 2 tablets (1,000 mg total) by mouth 3 (three) times daily as needed for mild pain. 30 tablet 0   divalproex (DEPAKOTE ER) 250 MG 24 hr tablet Take 250 mg by mouth daily.     DULoxetine (CYMBALTA) 60 MG capsule Take 2 capsules (120 mg total) by mouth daily. (Patient taking differently: Take 60 mg by mouth daily.) 60 capsule 1   ELIQUIS 5 MG TABS tablet TAKE 1 TABLET BY MOUTH TWICE A DAY 180 tablet 3   fluticasone (FLONASE) 50 MCG/ACT nasal spray SPRAY 2 SPRAYS INTO EACH NOSTRIL EVERY DAY (Patient taking differently: as needed. SPRAY 2 SPRAYS INTO EACH NOSTRIL EVERY DAY) 48 mL 3   GEMTESA 75 MG TABS Take 1 tablet by mouth daily.     hydrOXYzine (VISTARIL) 25 MG capsule Take 25 mg by mouth at bedtime.     LATUDA 60 MG TABS Take 2 tablets (120 mg total) by mouth at bedtime. (Patient taking differently: Take 60 mg by mouth at bedtime.) 30 tablet 1   metoprolol tartrate (LOPRESSOR) 50 MG tablet Take 1 tablet (50 mg total) by mouth 2 (two) times daily. 180 tablet 3   potassium chloride SA (KLOR-CON  M) 20 MEQ tablet Take 1 tablet (20 mEq total) by mouth daily. 30 tablet 5   dofetilide (TIKOSYN) 500 MCG capsule Take 1 capsule (500 mcg total) by mouth 2 (two) times daily. 60 capsule 0   No current facility-administered medications for this encounter.    Atrial Fibrillation Management history:  Previous antiarrhythmic drugs: flecainide failure, tikosyn discontinuation due to cost Previous cardioversions: none Previous ablations: none Anticoagulation history: Eliquis  ROS- All systems are reviewed and negative except  as per the HPI above.  Physical Exam: BP 122/78   Pulse 81   Ht 5\' 7"  (1.702 m)   Wt (!) 145.1 kg   BMI 50.09 kg/m   GEN- The patient is well appearing, alert and oriented x 3 today.   Neck - no JVD or carotid bruit noted Lungs- Clear to ausculation bilaterally, normal work of breathing Heart- Regular rate and rhythm, no murmurs, rubs or gallops, PMI not laterally displaced Extremities- no clubbing, cyanosis, or edema Skin - no rash or ecchymosis noted   EKG today demonstrates  Vent. rate 81 BPM PR interval 196 ms QRS duration 84 ms QT/QTcB 402/466 ms P-R-T axes 73 -33 59 Normal sinus rhythm Left axis deviation Inferior infarct , age undetermined Anterior infarct , age undetermined Abnormal ECG When compared with ECG of 24-Jul-2023 10:34, PREVIOUS ECG IS PRESENT  Echo 06/15/20 demonstrated   1. Left ventricular ejection fraction, by estimation, is 55 to 60%. The  left ventricle has normal function. The left ventricle has no regional  wall motion abnormalities. Left ventricular diastolic parameters were  normal.   2. Right ventricular systolic function is normal. The right ventricular  size is normal. Tricuspid regurgitation signal is inadequate for assessing  PA pressure.   3. The mitral valve is normal in structure. Trivial mitral valve  regurgitation. No evidence of mitral stenosis.   4. The aortic valve is tricuspid. Aortic valve regurgitation is not  visualized. No aortic stenosis is present.   Comparison(s): 04/17/17 EF 55-60%.   ASSESSMENT & PLAN CHA2DS2-VASc Score = 2  The patient's score is based upon: CHF History: 0 HTN History: 1 Diabetes History: 0 Stroke History: 0 Vascular Disease History: 0 Age Score: 0 Gender Score: 1      ASSESSMENT AND PLAN: Paroxysmal Atrial Fibrillation (ICD10:  I48.0) The patient's CHA2DS2-VASc score is 2, indicating a 2.2% annual risk of stroke.   S/p Tikosyn 9/16-19/24.  She is currently in NSR.   Qtc  stable. Continue Tikosyn 500 mcg BID. Bmet and mag drawn today.  I had a long discussion with patient regarding the importance of regular compliance with appointments for Tikosyn surveillance. I explained to patient the risk of non adherence to our safety protocol for monitoring could result in an abnormal heart rhythm that can lead to death. I also advised patient to not take any expired Tikosyn from her previous initial admission. I advised patient if she has actually missed two consecutive doses of Tikosyn since hospital discharge then the medication should be stopped. She tells me she has not missed two consecutive doses of dofetilide. I discussed case with Dr. Elberta Fortis and after discussion agree will send 1 month supply of dofetilide to [facility] pharmacy and have patient follow up prior to 1 month for Tikosyn surveillance. If she does not comply or follow up as stated then patient will not be given any additional refills and not be readmitted for Tikosyn.    F/u 1 month for Tikosyn surveillance.    Nelva Bush,  Lyman Speller  Afib Clinic [facility] [address] [place], [place] [address] [phone]

## 2023-10-09 ENCOUNTER — Ambulatory Visit (HOSPITAL_COMMUNITY)
Admission: RE | Admit: 2023-10-09 | Discharge: 2023-10-09 | Disposition: A | Discharge status: Home / Self Care | Payer: Medicaid Other | Source: Ambulatory Visit | Attending: Internal Medicine | Admitting: Internal Medicine

## 2023-10-09 VITALS — BP 114/76 | HR 84 | Ht 67.0 in | Wt 318.4 lb

## 2023-10-09 DIAGNOSIS — Z79899 Other long term (current) drug therapy: Secondary | ICD-10-CM | POA: Insufficient documentation

## 2023-10-09 DIAGNOSIS — I4891 Unspecified atrial fibrillation: Secondary | ICD-10-CM | POA: Diagnosis not present

## 2023-10-09 DIAGNOSIS — Z5181 Encounter for therapeutic drug level monitoring: Secondary | ICD-10-CM

## 2023-10-09 DIAGNOSIS — Z86711 Personal history of pulmonary embolism: Secondary | ICD-10-CM | POA: Diagnosis not present

## 2023-10-09 DIAGNOSIS — K219 Gastro-esophageal reflux disease without esophagitis: Secondary | ICD-10-CM | POA: Diagnosis not present

## 2023-10-09 DIAGNOSIS — I480 Paroxysmal atrial fibrillation: Secondary | ICD-10-CM | POA: Insufficient documentation

## 2023-10-09 DIAGNOSIS — G4733 Obstructive sleep apnea (adult) (pediatric): Secondary | ICD-10-CM | POA: Diagnosis not present

## 2023-10-09 DIAGNOSIS — M797 Fibromyalgia: Secondary | ICD-10-CM | POA: Insufficient documentation

## 2023-10-09 DIAGNOSIS — I10 Essential (primary) hypertension: Secondary | ICD-10-CM | POA: Insufficient documentation

## 2023-10-09 DIAGNOSIS — Z86718 Personal history of other venous thrombosis and embolism: Secondary | ICD-10-CM | POA: Insufficient documentation

## 2023-10-09 LAB — BASIC METABOLIC PANEL
Anion gap: 9 (ref 5–15)
BUN: 13 mg/dL (ref 6–20)
CO2: 26 mmol/L (ref 22–32)
Calcium: 9.1 mg/dL (ref 8.9–10.3)
Chloride: 106 mmol/L (ref 98–111)
Creatinine, Ser: 1 mg/dL (ref 0.44–1.00)
GFR, Estimated: >60 mL/min (ref >60)
Glucose, Bld: 131 mg/dL — ABNORMAL HIGH (ref 70–99)
Potassium: 3.9 mmol/L (ref 3.5–5.1)
Sodium: 141 mmol/L (ref 135–145)

## 2023-10-09 LAB — MAGNESIUM: Magnesium: 2.4 mg/dL (ref 1.7–2.4)

## 2023-10-09 MED ORDER — DOFETILIDE 500 MCG PO CAPS: 500.0000 ug | ORAL_CAPSULE | Freq: Two times a day (BID) | ORAL | 0 refills | Status: DC

## 2023-10-09 MED ORDER — POTASSIUM CHLORIDE CRYS ER 20 MEQ PO TBCR: 20.0000 meq | EXTENDED_RELEASE_TABLET | Freq: Every day | ORAL | 1 refills | Status: DC

## 2023-10-09 NOTE — Progress Notes (Signed)
Primary Care Physician: Loyola Mast, MD Primary Cardiologist: [name], MD Electrophysiologist: Will Jorja Loa, MD     Referring Physician: Dr. Camnitz     Regina Morales is a 62 y.o. female with a history of HTN, OSA on CPAP, DVT/PE, GERD, fibromyalgia, and atrial fibrillation who presents for consultation in the [facility]rial Fibrillation [facility]. Patient previously on Tikosyn but during visit with cardiology on 7/30 noted her insurance stopped paying for Tikosyn 6 months prior. She has been having episodes of Afib several times a week. Patient is on Eliquis for a CHADS2VASC score of 2.  On evaluation today, she is currently in NSR. She does admit that her Apple Watch has been confirming paroxysmal episodes of Afib. She feels like she cannot swallow and heart races when in Afib. Episodes are lasting for a few hours at a time. She stopped Tikosyn around April of this year due to being told it was going to cost her $1,500 a month. No missed doses or issues with Eliquis.   On follow up 07/21/23, patient is here for restart of Tikosyn admission. She was previously on Tikosyn 500 mcg BID. No new medications since last OV. No benadryl use. No missed doses of anticoagulant.   On follow up 09/18/23, she is currently in NSR. S/p Tikosyn admission 9/16-19/24. She did not require cardioversion and discharged on 500 mcg dosage. She has had no episodes of Afib since hospital discharge. Patient tells me she has missed several appointments due to oversleeping secondary to work schedule of second shift. She has missed the past two evening doses due to having missed multiple appointments and about to run out of Tikosyn.  On follow up 10/09/23, she is currently in NSR. She is here for 1 month Tikosyn surveillance. She has had no episodes of Afib since last office visit. She ran out of potassium about one week ago. Due to work schedule, she takes Therapist, occupational and Eliquis at 65 and 14. No missed  doses of Tikosyn or Eliquis.   Today, she denies symptoms of palpitations, chest pain, shortness of breath, orthopnea, PND, lower extremity edema, dizziness, presyncope, syncope, snoring, daytime somnolence, bleeding, or neurologic sequela. The patient is tolerating medications without difficulties and is otherwise without complaint today.    Atrial Fibrillation Risk Factors:  she does have symptoms or diagnosis of sleep apnea. she is compliant with CPAP therapy.  she has a BMI of Body mass index is 49.87 kg/m.Marland Kitchen Filed Weights   10/09/23 1413  Weight: (!) 144.4 kg    Current Outpatient Medications  Medication Sig Dispense Refill   acetaminophen (TYLENOL) 500 MG tablet Take 2 tablets (1,000 mg total) by mouth 3 (three) times daily as needed for mild pain. (Patient taking differently: Take 1,000 mg by mouth as needed for mild pain (pain score 1-3).) 30 tablet 0   divalproex (DEPAKOTE ER) 250 MG 24 hr tablet Take 250 mg by mouth daily.     DULoxetine (CYMBALTA) 60 MG capsule Take 2 capsules (120 mg total) by mouth daily. (Patient taking differently: Take 60 mg by mouth daily.) 60 capsule 1   ELIQUIS 5 MG TABS tablet TAKE 1 TABLET BY MOUTH TWICE A DAY 180 tablet 3   fluticasone (FLONASE) 50 MCG/ACT nasal spray SPRAY 2 SPRAYS INTO EACH NOSTRIL EVERY DAY (Patient taking differently: as needed. SPRAY 2 SPRAYS INTO EACH NOSTRIL EVERY DAY) 48 mL 3   GEMTESA 75 MG TABS Take 1 tablet by mouth daily.  hydrOXYzine (VISTARIL) 25 MG capsule Take 25 mg by mouth at bedtime.     LATUDA 60 MG TABS Take 2 tablets (120 mg total) by mouth at bedtime. (Patient taking differently: Take 60 mg by mouth at bedtime.) 30 tablet 1   metoprolol tartrate (LOPRESSOR) 50 MG tablet Take 1 tablet (50 mg total) by mouth 2 (two) times daily. 180 tablet 3   dofetilide (TIKOSYN) 500 MCG capsule Take 1 capsule (500 mcg total) by mouth 2 (two) times daily. No refills unless appt kept on 12/5 180 capsule 0   potassium chloride  SA (KLOR-CON M) 20 MEQ tablet Take 1 tablet (20 mEq total) by mouth daily. 90 tablet 1   No current facility-administered medications for this encounter.    Atrial Fibrillation Management history:  Previous antiarrhythmic drugs: flecainide failure, tikosyn discontinuation due to cost Previous cardioversions: none Previous ablations: none Anticoagulation history: Eliquis  ROS- All systems are reviewed and negative except as per the HPI above.  Physical Exam: BP 114/76   Pulse 84   Ht 5\' 7"  (1.702 m)   Wt (!) 144.4 kg   BMI 49.87 kg/m   GEN- The patient is well appearing, alert and oriented x 3 today.   Neck - no JVD or carotid bruit noted Lungs- Clear to ausculation bilaterally, normal work of breathing Heart- Regular rate and rhythm, no murmurs, rubs or gallops, PMI not laterally displaced Extremities- no clubbing, cyanosis, or edema Skin - no rash or ecchymosis noted   EKG today demonstrates  Vent. rate 84 BPM PR interval 186 ms QRS duration 86 ms QT/QTcB 394/465 ms P-R-T axes 85 -28 74 Normal sinus rhythm Possible Anterior infarct , age undetermined Abnormal ECG When compared with ECG of 18-Sep-2023 14:56, PREVIOUS ECG IS PRESENT  Echo 06/15/20 demonstrated   1. Left ventricular ejection fraction, by estimation, is 55 to 60%. The  left ventricle has normal function. The left ventricle has no regional  wall motion abnormalities. Left ventricular diastolic parameters were  normal.   2. Right ventricular systolic function is normal. The right ventricular  size is normal. Tricuspid regurgitation signal is inadequate for assessing  PA pressure.   3. The mitral valve is normal in structure. Trivial mitral valve  regurgitation. No evidence of mitral stenosis.   4. The aortic valve is tricuspid. Aortic valve regurgitation is not  visualized. No aortic stenosis is present.   Comparison(s): 04/17/17 EF 55-60%.   ASSESSMENT & PLAN CHA2DS2-VASc Score = 2  The patient's  score is based upon: CHF History: 0 HTN History: 1 Diabetes History: 0 Stroke History: 0 Vascular Disease History: 0 Age Score: 0 Gender Score: 1      ASSESSMENT AND PLAN: Paroxysmal Atrial Fibrillation (ICD10:  I48.0) The patient's CHA2DS2-VASc score is 2, indicating a 2.2% annual risk of stroke.   S/p Tikosyn 9/16-19/24.  She is currently in NSR.   Qtc stable. Continue Tikosyn 500 mcg BID. Bmet and mag drawn today.  She has been compliant with medication. Will continue with 90 day supply of Tikosyn and see patient in 3 months. No Afib on Tikosyn; doing well.    F/u 3 months for Tikosyn surveillance.    Lake Bells, PA-C  Afib Clinic [facility] [address] [place], [place] [address] [phone]

## 2023-11-10 ENCOUNTER — Ambulatory Visit: Payer: Self-pay | Admitting: Family Medicine

## 2023-11-10 ENCOUNTER — Encounter: Payer: Self-pay | Admitting: Family Medicine

## 2023-11-10 ENCOUNTER — Ambulatory Visit: Payer: Medicaid Other | Admitting: Family Medicine

## 2023-11-10 VITALS — BP 128/82 | HR 103 | Temp 97.9°F | Wt 325.8 lb

## 2023-11-10 DIAGNOSIS — J441 Chronic obstructive pulmonary disease with (acute) exacerbation: Secondary | ICD-10-CM

## 2023-11-10 DIAGNOSIS — J101 Influenza due to other identified influenza virus with other respiratory manifestations: Secondary | ICD-10-CM | POA: Diagnosis not present

## 2023-11-10 LAB — POC COVID19 BINAXNOW: SARS Coronavirus 2 Ag: NEGATIVE

## 2023-11-10 LAB — POCT RAPID STREP A (OFFICE): Rapid Strep A Screen: NEGATIVE

## 2023-11-10 LAB — POCT INFLUENZA A/B
Influenza A, POC: POSITIVE
Influenza B, POC: NEGATIVE

## 2023-11-10 MED ORDER — ALBUTEROL SULFATE HFA 108 (90 BASE) MCG/ACT IN AERS: 2.0000 | INHALATION_SPRAY | Freq: Four times a day (QID) | RESPIRATORY_TRACT | 0 refills | Status: AC | PRN

## 2023-11-10 MED ORDER — OSELTAMIVIR PHOSPHATE 75 MG PO CAPS: 75.0000 mg | ORAL_CAPSULE | Freq: Two times a day (BID) | ORAL | 0 refills | Status: DC

## 2023-11-10 NOTE — Progress Notes (Signed)
 Assessment/Plan:   Problem List Items Addressed This Visit       Respiratory   Influenza A - Primary   Tamiflu  75?mg orally twice daily for 5 days. Encourage hydration with clear fluids. Recommend over-the-counter medications for symptom relief Advise rest and monitor symptoms closely.      Relevant Medications   oseltamivir  (TAMIFLU ) 75 MG capsule   Other Relevant Orders   POC COVID-19 (Completed)   POCT Influenza A/B (Completed)   Chronic obstructive pulmonary disease with acute exacerbation (HCC)   Prescribe albuterol  inhaler to use as needed for cough and wheezing.      Relevant Medications   albuterol  (VENTOLIN  HFA) 108 (90 Base) MCG/ACT inhaler   Other Relevant Orders   POC COVID-19 (Completed)   POCT Influenza A/B (Completed)   POCT rapid strep A (Completed)    There are no discontinued medications.  Return if symptoms worsen or fail to improve.    Subjective:   Encounter date: 11/10/2023  Regina Morales is a 63 y.o. female who has Paroxysmal atrial fibrillation (HCC); GERD (gastroesophageal reflux disease); BMI 40.0-44.9, adult (HCC); Migraine; Normocytic anemia; Endometrial polyp; Encounter for monitoring dofetilide  therapy; Fibromyalgia; OSA (obstructive sleep apnea); Stage 3 severe COPD by GOLD classification (HCC); Venous insufficiency of both lower extremities; Severe recurrent major depression without psychotic features (HCC); Lumbar degenerative disc disease; Mild spinal stenosis at L4-L5 level; Diverticulitis; Epigastric abdominal pain; Postcholecystectomy diarrhea; Sensorineural hearing loss (SNHL) of both ears; Urinary incontinence; Gastritis without bleeding; Irritable bowel syndrome with diarrhea; NAFLD (nonalcoholic fatty liver disease); Influenza A; COVID-19; Bipolar 1 disorder (HCC); Psychogenic vomiting with nausea; and Chronic obstructive pulmonary disease with acute exacerbation (HCC) on their problem list..   She  has a past medical history of  Anemia, Anxiety, Buzzing in ear, Chest pain, COPD (chronic obstructive pulmonary disease) (HCC) (2018), Depression, DVT (deep vein thrombosis) in pregnancy (1980's), Fibromyalgia, GERD (gastroesophageal reflux disease), Hepatic steatosis, High cholesterol, History of blood transfusion, Hypertension, Migraines, Obesity, OSA on CPAP, Persistent atrial fibrillation (HCC), Pulmonary embolism (HCC) (~ 1984), and SVD (spontaneous vaginal delivery)..   Chief Complaint: Flu-like symptoms and general malaise.  History of Present Illness:  Patient reports onset of a late cough starting on Saturday, which worsened by Sunday. Current symptoms include headache, sore throat, stuffy nose, upset stomach, and overall not feeling well. Diarrhea is present without vomiting. Shortness of breath occurs when moving around but improves when resting or lying down. Patient is not eating but is drinking water to stay hydrated. No chest pain is reported.  Review of Systems:  Constitutional: Positive for headache, fever, chills, and general weakness. ENT: Positive for sore throat and nasal congestion. Respiratory: Positive for cough and shortness of breath on exertion; denies chest pain. Gastrointestinal: Positive for upset stomach and diarrhea; denies vomiting. Neurological: Denies dizziness or syncope. Psychiatric: No abnormalities reported. All other systems: Negative.   Past Surgical History:  Procedure Laterality Date   APPENDECTOMY  1990's   BREAST CYST ASPIRATION Left 2019   @ [facility]   CHOLECYSTECTOMY N/A 09/14/2021   Procedure: LAPAROSCOPIC CHOLECYSTECTOMY WITH INTRAOPERATIVE CHOLANGIOGRAM;  Surgeon: Dasie Leonor CROME, MD;  Location: [facility] OR;  Service: General;  Laterality: N/A;   COLONOSCOPY     DILATATION & CURETTAGE/HYSTEROSCOPY WITH MYOSURE N/A 04/14/2017   Procedure: DILATATION & CURETTAGE/HYSTEROSCOPY WITH MYOSURE;  Surgeon: Marilynn Nest, DO;  Location: [facility] ORS;  Service: Gynecology;  Laterality: N/A;   Polypectomy   DILATION AND CURETTAGE OF UTERUS  1983   [facility]  ERCP N/A 09/15/2021   Procedure: ENDOSCOPIC RETROGRADE CHOLANGIOPANCREATOGRAPHY (ERCP);  Surgeon: Rosalie Kitchens, MD;  Location: [facility] ENDOSCOPY;  Service: Endoscopy;  Laterality: N/A;   INNER EAR SURGERY Right 2000's   LEFT HEART CATH AND CORONARY ANGIOGRAPHY N/A 04/17/2017   Procedure: Left Heart Cath and Coronary Angiography;  Surgeon: Dann Candyce RAMAN, MD;  Location: [facility] INVASIVE CV LAB;  Service: Cardiovascular;  Laterality: N/A;   PLANTAR FASCIA RELEASE Right 07/2015   REMOVAL OF STONES  09/15/2021   Procedure: REMOVAL OF STONES;  Surgeon: Rosalie Kitchens, MD;  Location: [facility] ENDOSCOPY;  Service: Endoscopy;;   SPHINCTEROTOMY  09/15/2021   Procedure: ANNETT;  Surgeon: Rosalie Kitchens, MD;  Location: [facility] ENDOSCOPY;  Service: Endoscopy;;   TUBAL LIGATION  1985   ULNAR NERVE TRANSPOSITION Right     Outpatient Medications Prior to Visit  Medication Sig Dispense Refill   acetaminophen  (TYLENOL ) 500 MG tablet Take 2 tablets (1,000 mg total) by mouth 3 (three) times daily as needed for mild pain. 30 tablet 0   divalproex  (DEPAKOTE  ER) 250 MG 24 hr tablet Take 250 mg by mouth daily.     dofetilide  (TIKOSYN ) 500 MCG capsule Take 1 capsule (500 mcg total) by mouth 2 (two) times daily. No refills unless appt kept on 12/5 180 capsule 0   DULoxetine  (CYMBALTA ) 60 MG capsule Take 2 capsules (120 mg total) by mouth daily. 60 capsule 1   ELIQUIS  5 MG TABS tablet TAKE 1 TABLET BY MOUTH TWICE A DAY 180 tablet 3   fluticasone  (FLONASE ) 50 MCG/ACT nasal spray SPRAY 2 SPRAYS INTO EACH NOSTRIL EVERY DAY (Patient taking differently: as needed. SPRAY 2 SPRAYS INTO EACH NOSTRIL EVERY DAY) 48 mL 3   GEMTESA 75 MG TABS Take 1 tablet by mouth daily.     hydrOXYzine  (VISTARIL ) 25 MG capsule Take 25 mg by mouth at bedtime.     LATUDA  60 MG TABS Take 2 tablets (120 mg total) by mouth at bedtime. (Patient taking differently: Take 60 mg by mouth at bedtime.) 30  tablet 1   metoprolol  tartrate (LOPRESSOR ) 50 MG tablet Take 1 tablet (50 mg total) by mouth 2 (two) times daily. 180 tablet 3   potassium chloride  SA (KLOR-CON  M) 20 MEQ tablet Take 1 tablet (20 mEq total) by mouth daily. 90 tablet 1   No facility-administered medications prior to visit.    Family History  Problem Relation Age of Onset   Lung cancer Father    Alcohol abuse Father    Kidney failure Mother        s/p renal Tx   Congestive Heart Failure Mother    Atrial fibrillation Brother    Sudden Cardiac Death Daughter        cardiac arrest   Cardiomyopathy Daughter    Kassie Parkinson White syndrome Grandchild         granddaughter    Social History   Socioeconomic History   Marital status: Divorced    Spouse name: Not on file   Number of children: 4   Years of education: Not on file   Highest education level: Not on file  Occupational History   Occupation: Scientist, Product/process Development support    Employer: [employer]  Tobacco Use   Smoking status: Former    Current packs/day: 0.00    Average packs/day: 1 pack/day for 8.0 years (8.0 ttl pk-yrs)    Types: Cigarettes    Start date: 11/04/1982    Quit date: 11/04/1990    Years since quitting: 41.0  Smokeless tobacco: Never   Tobacco comments:    Former smoker 07/21/23  Vaping Use   Vaping status: Never Used  Substance and Sexual Activity   Alcohol use: Not Currently   Drug use: No   Sexual activity: Not Currently    Birth control/protection: Post-menopausal  Other Topics Concern   Not on file  Social History Narrative   Daughter died from cardiac arrest at age 33.   Social Drivers of Corporate Investment Banker Strain: Not on file  Food Insecurity: No Food Insecurity (07/21/2023)   Hunger Vital Sign    Worried About Running Out of Food in the Last Year: Never true    Ran Out of Food in the Last Year: Never true  Transportation Needs: No Transportation Needs (07/21/2023)   PRAPARE - Administrator, Civil Service  (Medical): No    Lack of Transportation (Non-Medical): No  Physical Activity: Not on file  Stress: Not on file  Social Connections: Not on file  Intimate Partner Violence: Not At Risk (07/21/2023)   Humiliation, Afraid, Rape, and Kick questionnaire    Fear of Current or Ex-Partner: No    Emotionally Abused: No    Physically Abused: No    Sexually Abused: No                                                                                                  Objective:  Physical Exam: BP 128/82   Pulse (!) 103   Temp 97.9 F (36.6 C) (Temporal)   Wt (!) 325 lb 12.8 oz (147.8 kg)   SpO2 97%   BMI 51.03 kg/m     Physical Exam Constitutional:      General: She is not in acute distress.    Appearance: Normal appearance. She is not ill-appearing or toxic-appearing.  HENT:     Head: Normocephalic and atraumatic.     Nose: Congestion present.     Mouth/Throat:     Pharynx: Posterior oropharyngeal erythema present.  Eyes:     General: No scleral icterus.    Extraocular Movements: Extraocular movements intact.  Cardiovascular:     Rate and Rhythm: Normal rate and regular rhythm.     Pulses: Normal pulses.     Heart sounds: Normal heart sounds.  Pulmonary:     Effort: Pulmonary effort is normal. No respiratory distress.     Breath sounds: Wheezing present.  Abdominal:     General: Abdomen is flat. Bowel sounds are normal.     Palpations: Abdomen is soft.  Musculoskeletal:        General: Normal range of motion.  Lymphadenopathy:     Cervical: No cervical adenopathy.  Skin:    General: Skin is warm and dry.     Findings: No rash.  Neurological:     General: No focal deficit present.     Mental Status: She is alert and oriented to person, place, and time. Mental status is at baseline.  Psychiatric:        Mood and Affect: Mood normal.  Behavior: Behavior normal.        Thought Content: Thought content normal.        Judgment: Judgment normal.     No results  found.  Recent Results (from the past 2160 hours)  Basic metabolic panel     Status: Abnormal   Collection Time: 09/18/23  2:46 PM  Result Value Ref Range   Sodium 141 135 - 145 mmol/L   Potassium 4.4 3.5 - 5.1 mmol/L   Chloride 110 98 - 111 mmol/L   CO2 24 22 - 32 mmol/L   Glucose, Bld 122 (H) 70 - 99 mg/dL    Comment: Glucose reference range applies only to samples taken after fasting for at least 8 hours.   BUN 14 6 - 20 mg/dL   Creatinine, Ser 9.21 0.44 - 1.00 mg/dL   Calcium  8.8 (L) 8.9 - 10.3 mg/dL   GFR, Estimated >39 >39 mL/min    Comment: (NOTE) Calculated using the CKD-EPI Creatinine Equation (2021)    Anion gap 7 5 - 15    Comment: Performed at [facility], [address], [place], [place] [address]  Magnesium      Status: None   Collection Time: 09/18/23  2:46 PM  Result Value Ref Range   Magnesium  2.3 1.7 - 2.4 mg/dL    Comment: Performed at [facility], [address] [place], [place] [address]  Basic metabolic panel     Status: Abnormal   Collection Time: 10/09/23  2:15 PM  Result Value Ref Range   Sodium 141 135 - 145 mmol/L   Potassium 3.9 3.5 - 5.1 mmol/L   Chloride 106 98 - 111 mmol/L   CO2 26 22 - 32 mmol/L   Glucose, Bld 131 (H) 70 - 99 mg/dL    Comment: Glucose reference range applies only to samples taken after fasting for at least 8 hours.   BUN 13 6 - 20 mg/dL   Creatinine, Ser 8.99 0.44 - 1.00 mg/dL   Calcium  9.1 8.9 - 10.3 mg/dL   GFR, Estimated >39 >39 mL/min    Comment: (NOTE) Calculated using the CKD-EPI Creatinine Equation (2021)    Anion gap 9 5 - 15    Comment: Performed at [facility], [address], [place], [place] [address]  Magnesium      Status: None   Collection Time: 10/09/23  2:15 PM  Result Value Ref Range   Magnesium  2.4 1.7 - 2.4 mg/dL    Comment: Performed at [facility], [address], [place], [place] [address]  POCT Influenza A/B     Status: Abnormal   Collection Time: 11/10/23  3:19 PM   Result Value Ref Range   Influenza A, POC Positive Negative   Influenza B, POC Negative Negative  POC COVID-19     Status: None   Collection Time: 11/10/23  4:29 PM  Result Value Ref Range   SARS Coronavirus 2 Ag Negative Negative  POCT rapid strep A     Status: None   Collection Time: 11/10/23  4:29 PM  Result Value Ref Range   Rapid Strep A Screen Negative Negative        Beverley Adine Hummer, MD, MS

## 2023-11-10 NOTE — Patient Instructions (Signed)
-   Take Tamiflu  75?mg, one pill twice a day for 5 days as prescribed. - Use albuterol  inhaler as needed for cough and wheezing. - Stay hydrated by drinking plenty of fluids and maintain a simple diet with clear liquids. - Continue taking Tylenol  as needed for fever and chills; you may use over-the-counter medications like Robitussin, Mucinex  DM, or Flonase  for congestion, but avoid ibuprofen  and naproxen . - Monitor for worsening symptoms; if symptoms worsen or do not improve, return to the clinic or go to the emergency department.

## 2023-11-10 NOTE — Telephone Encounter (Signed)
  Chief Complaint: URI  Symptoms: cough, sore throat, HA Frequency: started with light cough on Saturday got worse today Pertinent Negatives: Patient denies fever at this time, but is taking tylenol . Denies loss of taste, denies loss smell.   Disposition: [] ED /[] Urgent Care (no appt availability in office) / [x] Appointment(In office/virtual)/ []  [facility]/ [] Home Care/ [] Refused Recommended Disposition /[] [facility]/ []  Follow-up with PCP  Additional Notes: Did not get flu nor covid vaccine this year.  Has not tested for covid. Denies lung hx. Using tylenol  and Robitussin. Advised to call back should s/s worsen.      Copied from CRM [id number]. Topic: Clinical - Red Word Triage >> Nov 10, 2023  1:48 PM Melissa C wrote: [facility] that prompted transfer to Nurse Triage: patient has had a fever, cough, sore throat, upset stomach, and headache for 2 days Reason for Disposition  [1] Nasal discharge AND [2] present > 10 days  Answer Assessment - Initial Assessment Questions 1. ONSET: When did the nasal discharge start?      yesterday 2. AMOUNT: How much discharge is there?      Just runs, really bad, mainly just stuffy 3. COUGH: Do you have a cough? If Yes, ask: Describe the color of your sputum (clear, white, yellow, green)     Cough really bad, no production of cough 4. RESPIRATORY DISTRESS: Describe your breathing.      I just feel like I'm having a hard time breathing when I'm moving 5. FEVER: Do you have a fever? If Yes, ask: What is your temperature, how was it measured, and when did it start?     102 yesterday. Controlled with tylenol  6. SEVERITY: Overall, how bad are you feeling right now? (e.g., doesn't interfere with normal activities, staying home from school/work, staying in bed)      Staying in bed 7. OTHER SYMPTOMS: Do you have any other symptoms? (e.g., sore throat, earache, wheezing, vomiting)     Sore throat, HA, upset  stomach  Protocols used: Common Cold-A-AH

## 2023-11-13 DIAGNOSIS — J441 Chronic obstructive pulmonary disease with (acute) exacerbation: Secondary | ICD-10-CM | POA: Insufficient documentation

## 2023-11-13 NOTE — Assessment & Plan Note (Signed)
 Tamiflu 75?mg orally twice daily for 5 days. Encourage hydration with clear fluids. Recommend over-the-counter medications for symptom relief Advise rest and monitor symptoms closely.

## 2023-11-13 NOTE — Assessment & Plan Note (Signed)
 Prescribe albuterol inhaler to use as needed for cough and wheezing.

## 2023-11-14 ENCOUNTER — Ambulatory Visit: Payer: Medicaid Other | Admitting: Internal Medicine

## 2023-11-14 ENCOUNTER — Encounter: Payer: Self-pay | Admitting: Internal Medicine

## 2023-11-14 ENCOUNTER — Ambulatory Visit (INDEPENDENT_AMBULATORY_CARE_PROVIDER_SITE_OTHER)
Admission: RE | Admit: 2023-11-14 | Discharge: 2023-11-14 | Disposition: A | Discharge status: Home / Self Care | Payer: Medicaid Other | Source: Ambulatory Visit | Attending: Internal Medicine | Admitting: Internal Medicine

## 2023-11-14 VITALS — BP 174/88 | HR 97 | Temp 97.6°F | Ht 67.0 in | Wt 326.0 lb

## 2023-11-14 DIAGNOSIS — J101 Influenza due to other identified influenza virus with other respiratory manifestations: Secondary | ICD-10-CM

## 2023-11-14 DIAGNOSIS — J441 Chronic obstructive pulmonary disease with (acute) exacerbation: Secondary | ICD-10-CM | POA: Diagnosis not present

## 2023-11-14 MED ORDER — METHYLPREDNISOLONE SODIUM SUCC 125 MG IJ SOLR
125.0000 mg | Freq: Once | INTRAMUSCULAR | Status: AC
  Administered 2023-11-14: 125 mg via INTRAMUSCULAR

## 2023-11-14 MED ORDER — MISC. DEVICES MISC: 0 refills | Status: AC

## 2023-11-14 MED ORDER — BENZONATATE 100 MG PO CAPS: 100.0000 mg | ORAL_CAPSULE | Freq: Three times a day (TID) | ORAL | 0 refills | Status: DC | PRN

## 2023-11-14 MED ORDER — IPRATROPIUM-ALBUTEROL 0.5-2.5 (3) MG/3ML IN SOLN
3.0000 mL | Freq: Once | RESPIRATORY_TRACT | Status: AC
  Administered 2023-11-14: 3 mL via RESPIRATORY_TRACT

## 2023-11-14 MED ORDER — DOXYCYCLINE HYCLATE 100 MG PO TABS: 100.0000 mg | ORAL_TABLET | Freq: Two times a day (BID) | ORAL | 0 refills | Status: AC

## 2023-11-14 MED ORDER — IPRATROPIUM-ALBUTEROL 0.5-2.5 (3) MG/3ML IN SOLN: 3.0000 mL | RESPIRATORY_TRACT | 0 refills | Status: AC | PRN

## 2023-11-14 NOTE — Progress Notes (Signed)
 [facility] PRIMARY CARE [facility] [address] [place] [address] Dept: [phone] Dept Fax: [phone]  Acute Care Office Visit  Subjective:   Regina Morales July 17, 1961 11/14/2023  Chief Complaint  Patient presents with   Follow-up    Was seen on Monday     HPI: Discussed the use of AI scribe software for clinical note transcription with the patient, who gave verbal consent to proceed.  History of Present Illness   The patient, with a history of COPD, was diagnosed with influenza A on Monday. Despite taking Tamiflu , albuterol , Robitussin, Tylenol , nasal spray, and Vicks VapoRub, she reports persistent symptoms of cough, headache, nasal congestion, and upset stomach. She has not been able to sleep for a week due to the cough which worsens when she lies down. She also reports shortness of breath, particularly when moving around, which is not a usual symptom for her. She has been experiencing diarrhea, going to the bathroom three to four times a day. She also reports right ear pain that started a couple of days ago and intermittent sweats and chills, but denies fever.       The following portions of the patient's history were reviewed and updated as appropriate: past medical history, past surgical history, family history, social history, allergies, medications, and problem list.   Patient Active Problem List   Diagnosis Date Noted   Chronic obstructive pulmonary disease with acute exacerbation (HCC) 11/13/2023   Bipolar 1 disorder (HCC) 03/11/2023   Psychogenic vomiting with nausea 03/11/2023   COVID-19 11/18/2022   Influenza A 08/09/2022   Gastritis without bleeding 03/29/2022   Irritable bowel syndrome with diarrhea 03/29/2022   NAFLD (nonalcoholic fatty liver disease) 94/73/7976   Postcholecystectomy diarrhea 12/07/2021   Sensorineural hearing loss (SNHL) of both ears 12/07/2021   Urinary incontinence 12/07/2021   Epigastric  abdominal pain 08/20/2021   Diverticulitis 06/08/2021   Lumbar degenerative disc disease 05/28/2021   Mild spinal stenosis at L4-L5 level 05/28/2021   Severe recurrent major depression without psychotic features (HCC) 04/30/2020   OSA (obstructive sleep apnea) 07/27/2019   Stage 3 severe COPD by GOLD classification (HCC) 06/24/2019   Fibromyalgia 05/03/2019   Venous insufficiency of both lower extremities 04/29/2019   Encounter for monitoring dofetilide  therapy 09/15/2018   Endometrial polyp    Normocytic anemia 04/24/2014   Migraine 04/22/2014   Paroxysmal atrial fibrillation (HCC)    GERD (gastroesophageal reflux disease)    BMI 40.0-44.9, adult (HCC)    Past Medical History:  Diagnosis Date   Anemia    hx years ago   Anxiety    years ago   Buzzing in ear    right; even after OR   Chest pain    a. 2009: neg MV  ([facility])   COPD (chronic obstructive pulmonary disease) (HCC) 2018   Depression    DVT (deep vein thrombosis) in pregnancy 1980's   LLE   Fibromyalgia    dx'd many many years ago; I haven't had any problems w/it (04/15/2017)   GERD (gastroesophageal reflux disease)    Hepatic steatosis    High cholesterol    History of blood transfusion    low count after appendix surgery unsure # of units transfused   Hypertension    Migraines    maybe once/2 months (04/15/2017)   Obesity    OSA on CPAP    Persistent atrial fibrillation (HCC)    a. Dx 07/2013;  b. Almon (female);  c. 07/2013 Echo:  EF 50-55%, no rwma, mod LVH.   Pulmonary embolism (HCC) ~ 1984   after I had my last child   SVD (spontaneous vaginal delivery)    x 4   Past Surgical History:  Procedure Laterality Date   APPENDECTOMY  1990's   BREAST CYST ASPIRATION Left 2019   @ [facility]   CHOLECYSTECTOMY N/A 09/14/2021   Procedure: LAPAROSCOPIC CHOLECYSTECTOMY WITH INTRAOPERATIVE CHOLANGIOGRAM;  Surgeon: Dasie Leonor CROME, MD;  Location: [facility] OR;  Service: General;  Laterality: N/A;    COLONOSCOPY     DILATATION & CURETTAGE/HYSTEROSCOPY WITH MYOSURE N/A 04/14/2017   Procedure: DILATATION & CURETTAGE/HYSTEROSCOPY WITH MYOSURE;  Surgeon: Marilynn Nest, DO;  Location: [facility] ORS;  Service: Gynecology;  Laterality: N/A;  Polypectomy   DILATION AND CURETTAGE OF UTERUS  1983   [facility]   ERCP N/A 09/15/2021   Procedure: ENDOSCOPIC RETROGRADE CHOLANGIOPANCREATOGRAPHY (ERCP);  Surgeon: Rosalie Kitchens, MD;  Location: [facility] ENDOSCOPY;  Service: Endoscopy;  Laterality: N/A;   INNER EAR SURGERY Right 2000's   LEFT HEART CATH AND CORONARY ANGIOGRAPHY N/A 04/17/2017   Procedure: Left Heart Cath and Coronary Angiography;  Surgeon: Dann Candyce RAMAN, MD;  Location: [facility] INVASIVE CV LAB;  Service: Cardiovascular;  Laterality: N/A;   PLANTAR FASCIA RELEASE Right 07/2015   REMOVAL OF STONES  09/15/2021   Procedure: REMOVAL OF STONES;  Surgeon: Rosalie Kitchens, MD;  Location: [facility] ENDOSCOPY;  Service: Endoscopy;;   SPHINCTEROTOMY  09/15/2021   Procedure: ANNETT;  Surgeon: Rosalie Kitchens, MD;  Location: [facility] ENDOSCOPY;  Service: Endoscopy;;   TUBAL LIGATION  1985   ULNAR NERVE TRANSPOSITION Right    Family History  Problem Relation Age of Onset   Lung cancer Father    Alcohol abuse Father    Kidney failure Mother        s/p renal Tx   Congestive Heart Failure Mother    Atrial fibrillation Brother    Sudden Cardiac Death Daughter        cardiac arrest   Cardiomyopathy Daughter    Kassie Pour White syndrome Grandchild         granddaughter    Current Outpatient Medications:    acetaminophen  (TYLENOL ) 500 MG tablet, Take 2 tablets (1,000 mg total) by mouth 3 (three) times daily as needed for mild pain., Disp: 30 tablet, Rfl: 0   albuterol  (VENTOLIN  HFA) 108 (90 Base) MCG/ACT inhaler, Inhale 2 puffs into the lungs every 6 (six) hours as needed for wheezing or shortness of breath., Disp: 8 g, Rfl: 0   benzonatate  (TESSALON ) 100 MG capsule, Take 1 capsule (100 mg total) by mouth 3 (three) times daily  as needed for cough., Disp: 30 capsule, Rfl: 0   divalproex  (DEPAKOTE  ER) 250 MG 24 hr tablet, Take 250 mg by mouth daily., Disp: , Rfl:    dofetilide  (TIKOSYN ) 500 MCG capsule, Take 1 capsule (500 mcg total) by mouth 2 (two) times daily. No refills unless appt kept on 12/5, Disp: 180 capsule, Rfl: 0   doxycycline  (VIBRA -TABS) 100 MG tablet, Take 1 tablet (100 mg total) by mouth 2 (two) times daily for 7 days., Disp: 14 tablet, Rfl: 0   DULoxetine  (CYMBALTA ) 60 MG capsule, Take 2 capsules (120 mg total) by mouth daily., Disp: 60 capsule, Rfl: 1   ELIQUIS  5 MG TABS tablet, TAKE 1 TABLET BY MOUTH TWICE A DAY, Disp: 180 tablet, Rfl: 3   fluticasone  (FLONASE ) 50 MCG/ACT nasal spray, SPRAY 2 SPRAYS INTO EACH NOSTRIL EVERY DAY (Patient taking differently: as needed. SPRAY  2 SPRAYS INTO EACH NOSTRIL EVERY DAY), Disp: 48 mL, Rfl: 3   GEMTESA 75 MG TABS, Take 1 tablet by mouth daily., Disp: , Rfl:    hydrOXYzine  (VISTARIL ) 25 MG capsule, Take 25 mg by mouth at bedtime., Disp: , Rfl:    ipratropium-albuterol  (DUONEB) 0.5-2.5 (3) MG/3ML SOLN, Take 3 mLs by nebulization every 4 (four) hours as needed (shortness of breath or wheezing)., Disp: 120 mL, Rfl: 0   LATUDA  60 MG TABS, Take 2 tablets (120 mg total) by mouth at bedtime. (Patient taking differently: Take 60 mg by mouth at bedtime.), Disp: 30 tablet, Rfl: 1   metoprolol  tartrate (LOPRESSOR ) 50 MG tablet, Take 1 tablet (50 mg total) by mouth 2 (two) times daily., Disp: 180 tablet, Rfl: 3   Misc. Devices MISC, Nebulizer machine with tubing, Disp: 1 each, Rfl: 0   oseltamivir  (TAMIFLU ) 75 MG capsule, Take 1 capsule (75 mg total) by mouth 2 (two) times daily., Disp: 10 capsule, Rfl: 0   potassium chloride  SA (KLOR-CON  M) 20 MEQ tablet, Take 1 tablet (20 mEq total) by mouth daily., Disp: 90 tablet, Rfl: 1 Allergies  Allergen Reactions   Codeine Nausea And Vomiting, Other (See Comments) and Nausea Only     ROS: A complete ROS was performed with pertinent  positives/negatives noted in the HPI. The remainder of the ROS are negative.    Objective:   Today's Vitals   11/14/23 0847  BP: (!) 174/88  Pulse: 97  Temp: 97.6 F (36.4 C)  TempSrc: Temporal  SpO2: 99%  Weight: (!) 326 lb (147.9 kg)  Height: 5' 7 (1.702 m)    GENERAL: ill-appearing, nontoxic. in NAD. Well nourished.  SKIN: Pink, warm and dry. No rash.  HEENT:    HEAD: Normocephalic, non-traumatic.  EYES: Conjunctive pink without exudate. PERRL.  EARS: External ear w/o redness, swelling, masses, or lesions. EAC clear. TM's intact, translucent w/o bulging, appropriate landmarks visualized.  NOSE: Septum midline w/o deformity. Nares patent, mucosa pink and inflamed w/o drainage. No sinus tenderness.  THROAT: Uvula midline. Oropharynx clear. Tonsils non-inflamed w/o exudate . Mucus membranes pink and moist.  NECK: Trachea midline. Full ROM w/o pain or tenderness. No lymphadenopathy.  RESPIRATORY: Chest wall symmetrical. Respirations even and non-labored. Diffuse wheezing and rhonchi on expiration -significant improvement after neb tx in office CARDIAC: S1, S2 present, regular rate and rhythm. Peripheral pulses 2+ bilaterally.  EXTREMITIES: Without clubbing, cyanosis, or edema.  NEUROLOGIC: Steady, even gait.  PSYCH/MENTAL STATUS: Alert, oriented x 3. Cooperative, appropriate mood and affect.      Assessment & Plan:  Assessment and Plan    Influenza A Persistent symptoms including cough, congestion, and fatigue despite Tamiflu  treatment. -Continue Tamiflu  until completion. - Tessalon  Perles for cough -Continue supportive care with Robitussin, Tylenol , nasal spray, and Vicks VapoRub.  COPD exacerbation Increased shortness of breath and wheezing since onset of flu. -Administered DuoNeb nebulizer treatment in office with significant improvement. -Prescribe home nebulizer with DuoNeb solutions for use every 4 hours as needed. -Prescribe doxycycline  100mg  BID for 7 days   -Administer Solu-Medrol  injection 125mg  in office. -Order chest x-ray.  General Health Maintenance / Followup Plans -If symptoms fail to improve or worsen, patient advised to call 911 or go to the emergency room. -Provided work note.      Meds ordered this encounter  Medications   ipratropium-albuterol  (DUONEB) 0.5-2.5 (3) MG/3ML nebulizer solution 3 mL   doxycycline  (VIBRA -TABS) 100 MG tablet    Sig: Take 1 tablet (100 mg  total) by mouth 2 (two) times daily for 7 days.    Dispense:  14 tablet    Refill:  0    Supervising Provider:   THOMPSON, AARON B [[id number]]   methylPREDNISolone  sodium succinate (SOLU-MEDROL ) 125 mg/2 mL injection 125 mg   Misc. Devices MISC    Sig: Nebulizer machine with tubing    Dispense:  1 each    Refill:  0    Supervising Provider:   THOMPSON, AARON B [[id number]]   ipratropium-albuterol  (DUONEB) 0.5-2.5 (3) MG/3ML SOLN    Sig: Take 3 mLs by nebulization every 4 (four) hours as needed (shortness of breath or wheezing).    Dispense:  120 mL    Refill:  0    Supervising Provider:   THOMPSON, AARON B [[id number]]   benzonatate  (TESSALON ) 100 MG capsule    Sig: Take 1 capsule (100 mg total) by mouth 3 (three) times daily as needed for cough.    Dispense:  30 capsule    Refill:  0    Supervising Provider:   THOMPSON, AARON B [[id number]]   Orders Placed This Encounter  Procedures   DG Chest 2 View    Standing Status:   Future    Number of Occurrences:   1    Expiration Date:   05/13/2024    Reason for Exam (SYMPTOM  OR DIAGNOSIS REQUIRED):   productive cough, SHOB. recently dx with Flu A . Concern for PNA complication    Is the patient pregnant?:   No    Preferred imaging location?:   Internal   Lab Orders  No laboratory test(s) ordered today   No images are attached to the encounter or orders placed in the encounter.  Return if symptoms worsen or fail to improve.   Rosina Senters, FNP

## 2023-11-14 NOTE — Patient Instructions (Signed)
 If you do not get any better or worsen over the weekend, call 911 or go to the emergency room.

## 2023-12-31 ENCOUNTER — Ambulatory Visit (HOSPITAL_COMMUNITY): Payer: Medicaid Other | Admitting: Internal Medicine

## 2024-01-08 ENCOUNTER — Ambulatory Visit (HOSPITAL_COMMUNITY)
Admission: RE | Admit: 2024-01-08 | Discharge: 2024-01-08 | Disposition: A | Discharge status: Home / Self Care | Payer: Medicaid Other | Source: Ambulatory Visit | Attending: Internal Medicine | Admitting: Internal Medicine

## 2024-01-08 VITALS — BP 122/76 | HR 66 | Ht 67.0 in | Wt 325.4 lb

## 2024-01-08 DIAGNOSIS — Z86711 Personal history of pulmonary embolism: Secondary | ICD-10-CM | POA: Insufficient documentation

## 2024-01-08 DIAGNOSIS — G4733 Obstructive sleep apnea (adult) (pediatric): Secondary | ICD-10-CM | POA: Insufficient documentation

## 2024-01-08 DIAGNOSIS — I480 Paroxysmal atrial fibrillation: Secondary | ICD-10-CM | POA: Diagnosis present

## 2024-01-08 DIAGNOSIS — Z7901 Long term (current) use of anticoagulants: Secondary | ICD-10-CM | POA: Insufficient documentation

## 2024-01-08 DIAGNOSIS — Z86718 Personal history of other venous thrombosis and embolism: Secondary | ICD-10-CM | POA: Insufficient documentation

## 2024-01-08 DIAGNOSIS — Z79899 Other long term (current) drug therapy: Secondary | ICD-10-CM | POA: Insufficient documentation

## 2024-01-08 DIAGNOSIS — Z5181 Encounter for therapeutic drug level monitoring: Secondary | ICD-10-CM | POA: Insufficient documentation

## 2024-01-08 DIAGNOSIS — I10 Essential (primary) hypertension: Secondary | ICD-10-CM | POA: Insufficient documentation

## 2024-01-08 LAB — BASIC METABOLIC PANEL WITH GFR
Anion gap: 12 (ref 5–15)
BUN: 10 mg/dL (ref 6–20)
CO2: 23 mmol/L (ref 22–32)
Calcium: 9.1 mg/dL (ref 8.9–10.3)
Chloride: 106 mmol/L (ref 98–111)
Creatinine, Ser: 0.88 mg/dL (ref 0.44–1.00)
GFR, Estimated: >60 mL/min
Glucose, Bld: 168 mg/dL — ABNORMAL HIGH (ref 70–99)
Potassium: 3.6 mmol/L (ref 3.5–5.1)
Sodium: 141 mmol/L (ref 135–145)

## 2024-01-08 LAB — MAGNESIUM: Magnesium: 2.1 mg/dL (ref 1.7–2.4)

## 2024-01-08 MED ORDER — DOFETILIDE 500 MCG PO CAPS: 500.0000 ug | ORAL_CAPSULE | Freq: Two times a day (BID) | ORAL | 1 refills | Status: DC

## 2024-01-08 NOTE — Progress Notes (Signed)
 Primary Care Physician: Loyola Mast, MD Primary Cardiologist: [name], MD Electrophysiologist: Will Jorja Loa, MD     Referring Physician: Dr. Camnitz     Regina Morales is a 63 y.o. female with a history of HTN, OSA on CPAP, DVT/PE, GERD, fibromyalgia, and atrial fibrillation who presents for consultation in the [facility]. Patient previously on Tikosyn but during visit with cardiology on 7/30 noted her insurance stopped paying for Tikosyn 6 months prior. She has been having episodes of Afib several times a week. Patient is on Eliquis for a CHADS2VASC score of 2.  On evaluation today, she is currently in NSR. She does admit that her Apple Watch has been confirming paroxysmal episodes of Afib. She feels like she cannot swallow and heart races when in Afib. Episodes are lasting for a few hours at a time. She stopped Tikosyn around April of this year due to being told it was going to cost her $1,500 a month. No missed doses or issues with Eliquis.   On follow up 07/21/23, patient is here for restart of Tikosyn admission. She was previously on Tikosyn 500 mcg BID. No new medications since last OV. No benadryl use. No missed doses of anticoagulant.   On follow up 09/18/23, she is currently in NSR. S/p Tikosyn admission 9/16-19/24. She did not require cardioversion and discharged on 500 mcg dosage. She has had no episodes of Afib since hospital discharge. Patient tells me she has missed several appointments due to oversleeping secondary to work schedule of second shift. She has missed the past two evening doses due to having missed multiple appointments and about to run out of Tikosyn.  On follow up 10/09/23, she is currently in NSR. She is here for 1 month Tikosyn surveillance. She has had no episodes of Afib since last office visit. She ran out of potassium about one week ago. Due to work schedule, she takes Therapist, occupational and Eliquis at 20 and 12. No missed  doses of Tikosyn or Eliquis.   On follow up 01/08/24, she is currently here for Tikosyn surveillance. She is currently in NSR. She has had 2 episodes of Afib since last office visit. She notes they resolved on their own after ~6 hours. She is currently on Tikosyn 500 mcg BID. No missed doses of Tikosyn or Eliquis.   Today, she denies symptoms of palpitations, chest pain, shortness of breath, orthopnea, PND, lower extremity edema, dizziness, presyncope, syncope, snoring, daytime somnolence, bleeding, or neurologic sequela. The patient is tolerating medications without difficulties and is otherwise without complaint today.    Atrial Fibrillation Risk Factors:  she does have symptoms or diagnosis of sleep apnea. she is compliant with CPAP therapy.  she has a BMI of Body mass index is 50.96 kg/m.Marland Kitchen Filed Weights   01/08/24 0943  Weight: (!) 147.6 kg     Current Outpatient Medications  Medication Sig Dispense Refill   acetaminophen (TYLENOL) 500 MG tablet Take 2 tablets (1,000 mg total) by mouth 3 (three) times daily as needed for mild pain. (Patient taking differently: Take 1,000 mg by mouth as needed for mild pain (pain score 1-3).) 30 tablet 0   albuterol (VENTOLIN HFA) 108 (90 Base) MCG/ACT inhaler Inhale 2 puffs into the lungs every 6 (six) hours as needed for wheezing or shortness of breath. 8 g 0   divalproex (DEPAKOTE ER) 250 MG 24 hr tablet Take 250 mg by mouth daily.     DULoxetine (CYMBALTA) 60 MG  capsule Take 2 capsules (120 mg total) by mouth daily. 60 capsule 1   ELIQUIS 5 MG TABS tablet TAKE 1 TABLET BY MOUTH TWICE A DAY 180 tablet 3   fluticasone (FLONASE) 50 MCG/ACT nasal spray SPRAY 2 SPRAYS INTO EACH NOSTRIL EVERY DAY (Patient taking differently: as needed. SPRAY 2 SPRAYS INTO EACH NOSTRIL EVERY DAY) 48 mL 3   GEMTESA 75 MG TABS Take 1 tablet by mouth daily.     hydrOXYzine (VISTARIL) 25 MG capsule Take 25 mg by mouth at bedtime.     ipratropium-albuterol (DUONEB) 0.5-2.5 (3)  MG/3ML SOLN Take 3 mLs by nebulization every 4 (four) hours as needed (shortness of breath or wheezing). 120 mL 0   LATUDA 60 MG TABS Take 2 tablets (120 mg total) by mouth at bedtime. (Patient taking differently: Take 60 mg by mouth at bedtime.) 30 tablet 1   metoprolol tartrate (LOPRESSOR) 50 MG tablet Take 1 tablet (50 mg total) by mouth 2 (two) times daily. 180 tablet 3   Misc. Devices MISC Nebulizer machine with tubing 1 each 0   potassium chloride SA (KLOR-CON M) 20 MEQ tablet Take 1 tablet (20 mEq total) by mouth daily. 90 tablet 1   dofetilide (TIKOSYN) 500 MCG capsule Take 1 capsule (500 mcg total) by mouth 2 (two) times daily. 180 capsule 1   No current facility-administered medications for this encounter.    Atrial Fibrillation Management history:  Previous antiarrhythmic drugs: flecainide failure, tikosyn discontinuation due to cost Previous cardioversions: none Previous ablations: none Anticoagulation history: Eliquis  ROS- All systems are reviewed and negative except as per the HPI above.  Physical Exam: BP 122/76   Pulse 66   Ht 5\' 7"  (1.702 m)   Wt (!) 147.6 kg   BMI 50.96 kg/m   GEN- The patient is well appearing, alert and oriented x 3 today.   Neck - no JVD or carotid bruit noted Lungs- Clear to ausculation bilaterally, normal work of breathing Heart- Regular rate and rhythm, no murmurs, rubs or gallops, PMI not laterally displaced Extremities- no clubbing, cyanosis, or edema Skin - no rash or ecchymosis noted   EKG today demonstrates  Vent. rate 66 BPM PR interval 218 ms QRS duration 92 ms QT/QTcB 478/501 ms P-R-T axes 67 -25 22 Sinus rhythm with 1st degree A-V block Inferior infarct , age undetermined Anterior infarct , age undetermined Prolonged QT Abnormal ECG When compared with ECG of 09-Oct-2023 14:23, PREVIOUS ECG IS PRESENT  Echo 06/15/20 demonstrated   1. Left ventricular ejection fraction, by estimation, is 55 to 60%. The  left ventricle  has normal function. The left ventricle has no regional  wall motion abnormalities. Left ventricular diastolic parameters were  normal.   2. Right ventricular systolic function is normal. The right ventricular  size is normal. Tricuspid regurgitation signal is inadequate for assessing  PA pressure.   3. The mitral valve is normal in structure. Trivial mitral valve  regurgitation. No evidence of mitral stenosis.   4. The aortic valve is tricuspid. Aortic valve regurgitation is not  visualized. No aortic stenosis is present.   Comparison(s): 04/17/17 EF 55-60%.   ASSESSMENT & PLAN CHA2DS2-VASc Score = 2  The patient's score is based upon: CHF History: 0 HTN History: 1 Diabetes History: 0 Stroke History: 0 Vascular Disease History: 0 Age Score: 0 Gender Score: 1      ASSESSMENT AND PLAN: Paroxysmal Atrial Fibrillation (ICD10:  I48.0) The patient's CHA2DS2-VASc score is 2, indicating a 2.2% annual  risk of stroke.   S/p Tikosyn 9/16-19/24.  She is currently in NSR.  High risk medication monitoring (ICD10: R7229428) Patient requires ongoing monitoring for anti-arrhythmic medication which has the potential to cause life threatening arrhythmias or AV block. Qtc stable. Manual interpretation 461 ms. Continue Tikosyn 500 mcg BID. Bmet and mag drawn today.   She has been compliant with medication. Will continue with 90 day supply of Tikosyn and see patient in 3 months.   F/u 3 months for Tikosyn surveillance.    Lake Bells, PA-C  Afib Clinic [facility] [address] [place], [place] [address] [phone]

## 2024-01-09 ENCOUNTER — Other Ambulatory Visit (HOSPITAL_COMMUNITY): Payer: Self-pay | Admitting: *Deleted

## 2024-01-09 MED ORDER — POTASSIUM CHLORIDE CRYS ER 20 MEQ PO TBCR: 30.0000 meq | EXTENDED_RELEASE_TABLET | Freq: Every day | ORAL | 1 refills | Status: AC

## 2024-01-28 ENCOUNTER — Telehealth (HOSPITAL_COMMUNITY): Payer: Self-pay | Admitting: *Deleted

## 2024-01-28 MED ORDER — DILTIAZEM HCL 30 MG PO TABS: ORAL_TABLET | ORAL | 1 refills | Status: DC

## 2024-01-28 NOTE — Telephone Encounter (Signed)
 Patient called in stating she is having intermittent afib with heart rates in the 140s. BP is stable. Discussed with Landry Mellow PA will call in PRN cardizem 30mg  tablets to use every 4 hours as needed for HR over 100. If pt having frequent breakthrough she will notify office.

## 2024-02-04 ENCOUNTER — Other Ambulatory Visit (HOSPITAL_COMMUNITY): Payer: Self-pay | Admitting: Internal Medicine

## 2024-03-10 ENCOUNTER — Other Ambulatory Visit: Payer: Self-pay | Admitting: Family Medicine

## 2024-03-10 DIAGNOSIS — I480 Paroxysmal atrial fibrillation: Secondary | ICD-10-CM

## 2024-03-15 ENCOUNTER — Other Ambulatory Visit (HOSPITAL_COMMUNITY): Payer: Self-pay | Admitting: Internal Medicine

## 2024-04-08 ENCOUNTER — Ambulatory Visit (HOSPITAL_COMMUNITY): Admission: RE | Admit: 2024-04-08 | Source: Ambulatory Visit | Admitting: Internal Medicine

## 2024-04-08 NOTE — Progress Notes (Incomplete)
 Primary Care Physician: Graig Lawyer, MD Primary Cardiologist: Janelle Mediate, MD Electrophysiologist: Will Cortland Ding, MD     Referring Physician: Dr. Camnitz     Regina Morales is a 63 y.o. female with a history of HTN, OSA on CPAP, DVT/PE, GERD, fibromyalgia, and atrial fibrillation who presents for consultation in the [facility]. Patient previously on Tikosyn  but during visit with cardiology on 7/30 noted her insurance stopped paying for Tikosyn  6 months prior. She has been having episodes of Afib several times a week. Patient is on Eliquis  for a CHADS2VASC score of 2.  On evaluation today, she is currently in NSR. She does admit that her Apple Watch has been confirming paroxysmal episodes of Afib. She feels like she cannot swallow and heart races when in Afib. Episodes are lasting for a few hours at a time. She stopped Tikosyn  around April of this year due to being told it was going to cost her $1,500 a month. No missed doses or issues with Eliquis .   On follow up 07/21/23, patient is here for restart of Tikosyn  admission. She was previously on Tikosyn  500 mcg BID. No new medications since last OV. No benadryl  use. No missed doses of anticoagulant.   On follow up 09/18/23, she is currently in NSR. S/p Tikosyn  admission 9/16-19/24. She did not require cardioversion and discharged on 500 mcg dosage. She has had no episodes of Afib since hospital discharge. Patient tells me she has missed several appointments due to oversleeping secondary to work schedule of second shift. She has missed the past two evening doses due to having missed multiple appointments and about to run out of Tikosyn .  On follow up 10/09/23, she is currently in NSR. She is here for 1 month Tikosyn  surveillance. She has had no episodes of Afib since last office visit. She ran out of potassium about one week ago. Due to work schedule, she takes Tikosyn  and Eliquis  at 39 and 12. No missed  doses of Tikosyn  or Eliquis .   On follow up 01/08/24, she is currently here for Tikosyn  surveillance. She is currently in NSR. She has had 2 episodes of Afib since last office visit. She notes they resolved on their own after ~6 hours. She is currently on Tikosyn  500 mcg BID. No missed doses of Tikosyn  or Eliquis .   On follow up 04/08/24, she is here for Tikosyn  surveillance. She is currently in ***. She has had *** episodes of Afib since last office visit. No missed doses of Tikosyn  500 mcg BID or Eliquis .  Today, she denies symptoms of palpitations, chest pain, shortness of breath, orthopnea, PND, lower extremity edema, dizziness, presyncope, syncope, snoring, daytime somnolence, bleeding, or neurologic sequela. The patient is tolerating medications without difficulties and is otherwise without complaint today.    Atrial Fibrillation Risk Factors:  she does have symptoms or diagnosis of sleep apnea. she is compliant with CPAP therapy.  she has a BMI of There is no height or weight on file to calculate BMI.. There were no vitals filed for this visit.   Current Outpatient Medications  Medication Sig Dispense Refill   acetaminophen  (TYLENOL ) 500 MG tablet Take 2 tablets (1,000 mg total) by mouth 3 (three) times daily as needed for mild pain. (Patient taking differently: Take 1,000 mg by mouth as needed for mild pain (pain score 1-3).) 30 tablet 0   albuterol  (VENTOLIN  HFA) 108 (90 Base) MCG/ACT inhaler Inhale 2 puffs into the lungs every 6 (  six) hours as needed for wheezing or shortness of breath. 8 g 0   diltiazem  (CARDIZEM ) 30 MG tablet TAKE 1 TABLET EVERY 4 HOURS AS NEEDED FOR AFIB HEART RATE >100 AS LONG AS TOP BP >100. 270 tablet 1   divalproex  (DEPAKOTE  ER) 250 MG 24 hr tablet Take 250 mg by mouth daily.     dofetilide  (TIKOSYN ) 500 MCG capsule TAKE 1 CAPSULE BY MOUTH 2 TIMES DAILY. 180 capsule 1   DULoxetine  (CYMBALTA ) 60 MG capsule Take 2 capsules (120 mg total) by mouth daily. 60 capsule  1   ELIQUIS  5 MG TABS tablet TAKE 1 TABLET BY MOUTH TWICE A DAY 180 tablet 3   fluticasone  (FLONASE ) 50 MCG/ACT nasal spray SPRAY 2 SPRAYS INTO EACH NOSTRIL EVERY DAY (Patient taking differently: as needed. SPRAY 2 SPRAYS INTO EACH NOSTRIL EVERY DAY) 48 mL 3   GEMTESA 75 MG TABS Take 1 tablet by mouth daily.     hydrOXYzine  (VISTARIL ) 25 MG capsule Take 25 mg by mouth at bedtime.     ipratropium-albuterol  (DUONEB) 0.5-2.5 (3) MG/3ML SOLN Take 3 mLs by nebulization every 4 (four) hours as needed (shortness of breath or wheezing). 120 mL 0   LATUDA  60 MG TABS Take 2 tablets (120 mg total) by mouth at bedtime. (Patient taking differently: Take 60 mg by mouth at bedtime.) 30 tablet 1   metoprolol  tartrate (LOPRESSOR ) 50 MG tablet TAKE 1 TABLET BY MOUTH TWICE A DAY 180 tablet 3   Misc. Devices MISC Nebulizer machine with tubing 1 each 0   potassium chloride  SA (KLOR-CON  M) 20 MEQ tablet Take 1.5 tablets (30 mEq total) by mouth daily. 135 tablet 1   No current facility-administered medications for this visit.    Atrial Fibrillation Management history:  Previous antiarrhythmic drugs: flecainide  failure, tikosyn   Previous cardioversions: none Previous ablations: none Anticoagulation history: Eliquis   ROS- All systems are reviewed and negative except as per the HPI above.  Physical Exam: There were no vitals taken for this visit.  GEN- The patient is well appearing, alert and oriented x 3 today.   Neck - no JVD or carotid bruit noted Lungs- Clear to ausculation bilaterally, normal work of breathing Heart- ***Regular rate and rhythm, no murmurs, rubs or gallops, PMI not laterally displaced Extremities- no clubbing, cyanosis, or edema Skin - no rash or ecchymosis noted   EKG today demonstrates  ***  Echo 06/15/20 demonstrated   1. Left ventricular ejection fraction, by estimation, is 55 to 60%. The  left ventricle has normal function. The left ventricle has no regional  wall motion  abnormalities. Left ventricular diastolic parameters were  normal.   2. Right ventricular systolic function is normal. The right ventricular  size is normal. Tricuspid regurgitation signal is inadequate for assessing  PA pressure.   3. The mitral valve is normal in structure. Trivial mitral valve  regurgitation. No evidence of mitral stenosis.   4. The aortic valve is tricuspid. Aortic valve regurgitation is not  visualized. No aortic stenosis is present.   Comparison(s): 04/17/17 EF 55-60%.   ASSESSMENT & PLAN CHA2DS2-VASc Score = 2  The patient's score is based upon: CHF History: 0 HTN History: 1 Diabetes History: 0 Stroke History: 0 Vascular Disease History: 0 Age Score: 0 Gender Score: 1      ASSESSMENT AND PLAN: Paroxysmal Atrial Fibrillation (ICD10:  I48.0) The patient's CHA2DS2-VASc score is 2, indicating a 2.2% annual risk of stroke.   S/p Tikosyn  9/16-19/24.  She is currently in ***.  High risk medication monitoring (ICD10: J342684) Patient requires ongoing monitoring for anti-arrhythmic medication which has the potential to cause life threatening arrhythmias or AV block. Qtc stable. Continue Tikosyn  500 mcg BID. Bmet and mag drawn today.   Continue Eliquis  5 mg BID as per patient preference.     F/u 3 months for Tikosyn  surveillance.    Minnie Amber, PA-C  Af[facility] Clinic [facility] [address] [place], [place] [address] [phone]

## 2024-04-19 ENCOUNTER — Ambulatory Visit (HOSPITAL_COMMUNITY)
Admission: RE | Admit: 2024-04-19 | Discharge: 2024-04-19 | Disposition: A | Discharge status: Home / Self Care | Source: Ambulatory Visit | Attending: Internal Medicine | Admitting: Internal Medicine

## 2024-04-19 VITALS — BP 130/82 | HR 93 | Ht 67.0 in | Wt 323.6 lb

## 2024-04-19 DIAGNOSIS — G4733 Obstructive sleep apnea (adult) (pediatric): Secondary | ICD-10-CM | POA: Insufficient documentation

## 2024-04-19 DIAGNOSIS — Z86711 Personal history of pulmonary embolism: Secondary | ICD-10-CM | POA: Insufficient documentation

## 2024-04-19 DIAGNOSIS — Z5181 Encounter for therapeutic drug level monitoring: Secondary | ICD-10-CM | POA: Diagnosis not present

## 2024-04-19 DIAGNOSIS — K219 Gastro-esophageal reflux disease without esophagitis: Secondary | ICD-10-CM | POA: Diagnosis not present

## 2024-04-19 DIAGNOSIS — Z86718 Personal history of other venous thrombosis and embolism: Secondary | ICD-10-CM | POA: Insufficient documentation

## 2024-04-19 DIAGNOSIS — I480 Paroxysmal atrial fibrillation: Secondary | ICD-10-CM | POA: Diagnosis not present

## 2024-04-19 DIAGNOSIS — I4891 Unspecified atrial fibrillation: Secondary | ICD-10-CM

## 2024-04-19 DIAGNOSIS — Z79899 Other long term (current) drug therapy: Secondary | ICD-10-CM | POA: Diagnosis not present

## 2024-04-19 DIAGNOSIS — Z7901 Long term (current) use of anticoagulants: Secondary | ICD-10-CM | POA: Diagnosis not present

## 2024-04-19 NOTE — Progress Notes (Signed)
 Primary Care Physician: Graig Lawyer, MD Primary Cardiologist: Janelle Mediate, MD Electrophysiologist: Will Cortland Ding, MD     Referring Physician: Dr. Camnitz     Regina Morales is a 63 y.o. female with a history of HTN, OSA on CPAP, DVT/PE, GERD, fibromyalgia, and atrial fibrillation who presents for consultation in the [facility]. Patient previously on Tikosyn  but during visit with cardiology on 7/30 noted her insurance stopped paying for Tikosyn  6 months prior. She has been having episodes of Afib several times a week. Patient is on Eliquis  for a CHADS2VASC score of 2.  On evaluation today, she is currently in NSR. She does admit that her Apple Watch has been confirming paroxysmal episodes of Afib. She feels like she cannot swallow and heart races when in Afib. Episodes are lasting for a few hours at a time. She stopped Tikosyn  around April of this year due to being told it was going to cost her $1,500 a month. No missed doses or issues with Eliquis .   On follow up 07/21/23, patient is here for restart of Tikosyn  admission. She was previously on Tikosyn  500 mcg BID. No new medications since last OV. No benadryl  use. No missed doses of anticoagulant.   On follow up 09/18/23, she is currently in NSR. S/p Tikosyn  admission 9/16-19/24. She did not require cardioversion and discharged on 500 mcg dosage. She has had no episodes of Afib since hospital discharge. Patient tells me she has missed several appointments due to oversleeping secondary to work schedule of second shift. She has missed the past two evening doses due to having missed multiple appointments and about to run out of Tikosyn .  On follow up 10/09/23, she is currently in NSR. She is here for 1 month Tikosyn  surveillance. She has had no episodes of Afib since last office visit. She ran out of potassium about one week ago. Due to work schedule, she takes Tikosyn  and Eliquis  at 5 and 12. No missed  doses of Tikosyn  or Eliquis .   On follow up 01/08/24, she is currently here for Tikosyn  surveillance. She is currently in NSR. She has had 2 episodes of Afib since last office visit. She notes they resolved on their own after ~6 hours. She is currently on Tikosyn  500 mcg BID. No missed doses of Tikosyn  or Eliquis .   On follow up 04/19/24, she is here for Tikosyn  surveillance. She is currently in NSR. She has had 1 episode of Afib since last office visit. The episode was back in April and lasted for 30 minutes. She is about to start a new job and it will be night shift; she will be starting orientation on Monday for the next 8-10 weeks. No missed doses of Tikosyn  500 mcg BID or Eliquis .  Today, she denies symptoms of palpitations, chest pain, shortness of breath, orthopnea, PND, lower extremity edema, dizziness, presyncope, syncope, snoring, daytime somnolence, bleeding, or neurologic sequela. The patient is tolerating medications without difficulties and is otherwise without complaint today.    Atrial Fibrillation Risk Factors:  she does have symptoms or diagnosis of sleep apnea. she is compliant with CPAP therapy.  she has a BMI of Body mass index is 50.68 kg/m.Aaron Aas Filed Weights   04/19/24 1330  Weight: (!) 146.8 kg     Current Outpatient Medications  Medication Sig Dispense Refill   acetaminophen  (TYLENOL ) 500 MG tablet Take 2 tablets (1,000 mg total) by mouth 3 (three) times daily as needed for mild pain. (  Patient taking differently: Take 1,000 mg by mouth as needed for mild pain (pain score 1-3).) 30 tablet 0   albuterol  (VENTOLIN  HFA) 108 (90 Base) MCG/ACT inhaler Inhale 2 puffs into the lungs every 6 (six) hours as needed for wheezing or shortness of breath. 8 g 0   diltiazem  (CARDIZEM ) 30 MG tablet TAKE 1 TABLET EVERY 4 HOURS AS NEEDED FOR AFIB HEART RATE >100 AS LONG AS TOP BP >100. 270 tablet 1   divalproex  (DEPAKOTE  ER) 250 MG 24 hr tablet Take 250 mg by mouth daily.     dofetilide   (TIKOSYN ) 500 MCG capsule TAKE 1 CAPSULE BY MOUTH 2 TIMES DAILY. 180 capsule 1   DULoxetine  (CYMBALTA ) 60 MG capsule Take 2 capsules (120 mg total) by mouth daily. 60 capsule 1   ELIQUIS  5 MG TABS tablet TAKE 1 TABLET BY MOUTH TWICE A DAY 180 tablet 3   fluticasone  (FLONASE ) 50 MCG/ACT nasal spray SPRAY 2 SPRAYS INTO EACH NOSTRIL EVERY DAY 48 mL 3   GEMTESA 75 MG TABS Take 1 tablet by mouth daily.     hydrOXYzine  (VISTARIL ) 25 MG capsule Take 25 mg by mouth at bedtime.     ipratropium-albuterol  (DUONEB) 0.5-2.5 (3) MG/3ML SOLN Take 3 mLs by nebulization every 4 (four) hours as needed (shortness of breath or wheezing). 120 mL 0   LATUDA  60 MG TABS Take 2 tablets (120 mg total) by mouth at bedtime. 30 tablet 1   metoprolol  tartrate (LOPRESSOR ) 50 MG tablet TAKE 1 TABLET BY MOUTH TWICE A DAY 180 tablet 3   Misc. Devices MISC Nebulizer machine with tubing 1 each 0   potassium chloride  SA (KLOR-CON  M) 20 MEQ tablet Take 1.5 tablets (30 mEq total) by mouth daily. 135 tablet 1   No current facility-administered medications for this encounter.    Atrial Fibrillation Management history:  Previous antiarrhythmic drugs: flecainide  failure, tikosyn   Previous cardioversions: none Previous ablations: none Anticoagulation history: Eliquis   ROS- All systems are reviewed and negative except as per the HPI above.  Physical Exam: BP 130/82   Pulse 93   Ht 5' 7 (1.702 m)   Wt (!) 146.8 kg   BMI 50.68 kg/m   GEN- The patient is well appearing, alert and oriented x 3 today.   Neck - no JVD or carotid bruit noted Lungs- Clear to ausculation bilaterally, normal work of breathing Heart- Regular rate and rhythm, no murmurs, rubs or gallops, PMI not laterally displaced Extremities- no clubbing, cyanosis, or edema Skin - no rash or ecchymosis noted   EKG today demonstrates  Vent. rate 93 BPM PR interval 182 ms QRS duration 92 ms QT/QTcB 388/482 ms P-R-T axes 52 -27 59 Normal sinus rhythm Minimal  voltage criteria for LVH, may be normal variant ( R in aVL ) Inferior infarct (cited on or before 18-Sep-2023) Anterior infarct (cited on or before 18-Sep-2023) Abnormal ECG When compared with ECG of 08-Jan-2024 10:21, PR interval has decreased  Echo 06/15/20 demonstrated   1. Left ventricular ejection fraction, by estimation, is 55 to 60%. The  left ventricle has normal function. The left ventricle has no regional  wall motion abnormalities. Left ventricular diastolic parameters were  normal.   2. Right ventricular systolic function is normal. The right ventricular  size is normal. Tricuspid regurgitation signal is inadequate for assessing  PA pressure.   3. The mitral valve is normal in structure. Trivial mitral valve  regurgitation. No evidence of mitral stenosis.   4. The aortic valve is  tricuspid. Aortic valve regurgitation is not  visualized. No aortic stenosis is present.   Comparison(s): 04/17/17 EF 55-60%.   ASSESSMENT & PLAN CHA2DS2-VASc Score = 2  The patient's score is based upon: CHF History: 0 HTN History: 1 Diabetes History: 0 Stroke History: 0 Vascular Disease History: 0 Age Score: 0 Gender Score: 1      ASSESSMENT AND PLAN: Paroxysmal Atrial Fibrillation (ICD10:  I48.0) The patient's CHA2DS2-VASc score is 2, indicating a 2.2% annual risk of stroke.   S/p Tikosyn  9/16-19/24.  She is currently in NSR.   High risk medication monitoring (ICD10: [id number]) Patient requires ongoing monitoring for anti-arrhythmic medication which has the potential to cause life threatening arrhythmias or AV block. Qtc stable. Continue Tikosyn  500 mcg BID. Bmet and mag drawn today.   Continue Eliquis  5 mg BID as per patient preference.     F/u 3 months for Tikosyn  surveillance.    Minnie Amber, PA-C  Afib Clinic [facility] [address] [place], [place] [address] [phone]

## 2024-04-20 ENCOUNTER — Ambulatory Visit (HOSPITAL_COMMUNITY): Payer: Self-pay | Admitting: Internal Medicine

## 2024-04-20 LAB — BASIC METABOLIC PANEL WITH GFR
BUN/Creatinine Ratio: 13 (ref 12–28)
BUN: 11 mg/dL (ref 8–27)
CO2: 21 mmol/L (ref 20–29)
Calcium: 9.3 mg/dL (ref 8.7–10.3)
Chloride: 105 mmol/L (ref 96–106)
Creatinine, Ser: 0.86 mg/dL (ref 0.57–1.00)
Glucose: 138 mg/dL — ABNORMAL HIGH (ref 70–99)
Potassium: 4.1 mmol/L (ref 3.5–5.2)
Sodium: 141 mmol/L (ref 134–144)
eGFR: 77 mL/min/{1.73_m2} (ref >59)

## 2024-04-20 LAB — MAGNESIUM: Magnesium: 2.2 mg/dL (ref 1.6–2.3)

## 2024-04-21 ENCOUNTER — Telehealth: Payer: Self-pay | Admitting: Cardiovascular Disease

## 2024-04-21 NOTE — Telephone Encounter (Signed)
 Patient stated she will need a letter from Dr. Nishan for her insurance company which states she does not have CHF and has not been on Furosemide  since 2020.

## 2024-04-21 NOTE — Telephone Encounter (Signed)
 Spoke with patient and she states she is applying for insurance. She states she needs a letter stating she has not been diagnosed with heart failure and has not taken lasix  since 2020

## 2024-04-22 ENCOUNTER — Telehealth: Payer: Self-pay | Admitting: Family Medicine

## 2024-04-22 NOTE — Telephone Encounter (Signed)
 Called patient back to let her know of Dr. Francie Irani response.   Per Dr. Stann Earnest, [facility] to write letter indicating patient does not have CHF either diastolic or systolic MRI and echo in 2021 showed normal ejection fraction.  Patient stated that she would need a signed copy of this letter. Will type up letter and have Dr. Stann Earnest sign tomorrow.

## 2024-04-22 NOTE — Telephone Encounter (Signed)
 Called and spoke to patient, advised that she would need to come in for an appointment due to hasn't seen Dr Therese Flash in over a year.  She has a letter from her Cardiologist and will just give them that one.  She is stating a new job on 04/26/24 and for 19 weeks will be in training and not able to make an appointment.  Will call back to scheduled later if still needed. Dm/cma

## 2024-04-22 NOTE — Telephone Encounter (Signed)
 Copied from CRM [id number]. Topic: Medical Record Request - Other >> Apr 21, 2024  3:59 PM Regina Morales wrote: Reason for CRM: Patient requesting for Dr.Rudd to provide a letter stating that she never had heart failure and have not been proscribed Furosemib since 2020. Would like a call to discuss

## 2024-04-26 NOTE — Telephone Encounter (Signed)
 Left message on patient's voicemail to let her know that letter is at front desk.

## 2024-06-02 ENCOUNTER — Other Ambulatory Visit: Payer: Self-pay | Admitting: Medical Genetics

## 2024-06-08 ENCOUNTER — Encounter: Admitting: Family Medicine

## 2024-06-08 ENCOUNTER — Encounter: Payer: Self-pay | Admitting: Family Medicine

## 2024-06-18 ENCOUNTER — Ambulatory Visit (INDEPENDENT_AMBULATORY_CARE_PROVIDER_SITE_OTHER): Admitting: Family Medicine

## 2024-06-18 ENCOUNTER — Encounter: Payer: Self-pay | Admitting: Family Medicine

## 2024-06-18 VITALS — BP 118/82 | HR 80 | Temp 97.8°F | Ht 67.0 in | Wt 321.0 lb

## 2024-06-18 DIAGNOSIS — Z1231 Encounter for screening mammogram for malignant neoplasm of breast: Secondary | ICD-10-CM

## 2024-06-18 DIAGNOSIS — G4733 Obstructive sleep apnea (adult) (pediatric): Secondary | ICD-10-CM | POA: Diagnosis not present

## 2024-06-18 DIAGNOSIS — Z1159 Encounter for screening for other viral diseases: Secondary | ICD-10-CM

## 2024-06-18 DIAGNOSIS — G43709 Chronic migraine without aura, not intractable, without status migrainosus: Secondary | ICD-10-CM | POA: Diagnosis not present

## 2024-06-18 DIAGNOSIS — Z0000 Encounter for general adult medical examination without abnormal findings: Secondary | ICD-10-CM | POA: Diagnosis not present

## 2024-06-18 DIAGNOSIS — I480 Paroxysmal atrial fibrillation: Secondary | ICD-10-CM

## 2024-06-18 DIAGNOSIS — K219 Gastro-esophageal reflux disease without esophagitis: Secondary | ICD-10-CM

## 2024-06-18 DIAGNOSIS — R200 Anesthesia of skin: Secondary | ICD-10-CM | POA: Insufficient documentation

## 2024-06-18 MED ORDER — ZOLMITRIPTAN 5 MG PO TABS: 5.0000 mg | ORAL_TABLET | ORAL | 2 refills | Status: AC | PRN

## 2024-06-18 MED ORDER — OMEPRAZOLE 20 MG PO CPDR: 20.0000 mg | DELAYED_RELEASE_CAPSULE | Freq: Every day | ORAL | 3 refills | Status: DC

## 2024-06-18 NOTE — Assessment & Plan Note (Signed)
 I will check screening labs for potential causes of this. Her numbness may relate to her spinal stenosis.

## 2024-06-18 NOTE — Assessment & Plan Note (Signed)
 Stable. I will renew zolmitriptan  5 mg PRN migraine.

## 2024-06-18 NOTE — Assessment & Plan Note (Signed)
 I will try her on a course of omeprazole  to see if we can reduce her GERD symptoms.

## 2024-06-18 NOTE — Assessment & Plan Note (Signed)
 Appears to be in a normal sinus rhythm. S/p ablation. Continue dofetilide  (Tikosyn ) 500 mcg bid for rhythm control, diltiazem  30 mg PRN tachycardia and metoprolol  tartrate 50 mg bid for rate control. Continue apixaban  5 mg bid for stroke prevention.

## 2024-06-18 NOTE — Assessment & Plan Note (Signed)
 Regina Morales is having marked daytime somnolence, but admits she is not using her CPAP. She notes her dog barks at the unit when she wears this at night. I discussed with her about approaches to desensitize her dog to the units use, so she could return to using this.

## 2024-06-18 NOTE — Progress Notes (Signed)
 [facility] PRIMARY CARE [facility] [address] [place] [address] Dept: [phone] Dept Fax: [phone]  Annual Physical Visit  Subjective:    Patient ID: Regina Morales, female    DOB: 1961/03/15, 63 y.o..   MRN: [id number]  Chief Complaint  Patient presents with   Annual Exam    CPE/lab.  C/o sleeping a lot.    History of Present Illness:  Patient is in today for an annual physical/preventative visit.  Review of Systems  Constitutional:  Negative for chills, diaphoresis, fever, malaise/fatigue and weight loss.  HENT:  Positive for hearing loss. Negative for congestion, ear pain, sinus pain, sore throat and tinnitus.        Regina Morales has a history of bilateral hearing loss. she had hearing aides, but one of these broke. She has been unable to afford a replacement.  Eyes:  Negative for blurred vision, pain, discharge and redness.  Respiratory:  Negative for cough, shortness of breath and wheezing.   Cardiovascular:  Positive for palpitations. Negative for chest pain.       History of a. fib. Managed on diltiazem  and metoprolol .  Gastrointestinal:  Positive for diarrhea and heartburn. Negative for abdominal pain, constipation, nausea and vomiting.       Notes she has regular episodes of heartburn, at least 3-4 times a week. This does interfere with her sleep.  History of IBS-D. Continues to have diarrhea triggered by certain meals.  Musculoskeletal:  Negative for back pain, joint pain and myalgias.  Skin:  Negative for itching and rash.  Neurological:  Positive for sensory change and headaches.       History of migraine headaches. Has previously responded to zolmitriptan , but has been out of this medicine.  Notes the soles of both feet are numb. She has some history of low back pain and L4-5 spinal stenosis.  Psychiatric/Behavioral:  Positive for depression. The patient is nervous/anxious.        History of bipolar disorder, depression,  and anxiety. Is followed with psychiatry.   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   COVID-19 11/18/2022   Gastritis without bleeding 03/29/2022   Irritable bowel syndrome with diarrhea 03/29/2022   NAFLD (nonalcoholic fatty liver disease) 94/73/7976   Postcholecystectomy diarrhea 12/07/2021   Sensorineural hearing loss (SNHL) of both ears 12/07/2021   Urinary incontinence 12/07/2021   Epigastric abdominal pain 08/20/2021   Diverticulitis 06/08/2021   Lumbar degenerative disc disease 05/28/2021   Mild spinal stenosis at L4-L5 level 05/28/2021   Severe recurrent major depression without psychotic features (HCC) 04/30/2020   OSA (obstructive sleep apnea) 07/27/2019   Stage 3 severe COPD by GOLD classification (HCC) 06/24/2019   Fibromyalgia 05/03/2019   Venous insufficiency of both lower extremities 04/29/2019   Encounter for monitoring dofetilide  therapy 09/15/2018   Endometrial polyp    Normocytic anemia 04/24/2014   Migraine 04/22/2014   Paroxysmal atrial fibrillation (HCC)    GERD (gastroesophageal reflux disease)    BMI 40.0-44.9, adult [facility])    Past Surgical History:  Procedure Laterality Date   APPENDECTOMY  1990's   BREAST CYST ASPIRATION Left 2019   @ [facility]   CHOLECYSTECTOMY N/A 09/14/2021   Procedure: LAPAROSCOPIC CHOLECYSTECTOMY WITH INTRAOPERATIVE CHOLANGIOGRAM;  Surgeon: Dasie,  Leonor CROME, MD;  Location: [facility] OR;  Service: General;  Laterality: N/A;   COLONOSCOPY     DILATATION & CURETTAGE/HYSTEROSCOPY WITH MYOSURE N/A 04/14/2017   Procedure: DILATATION & CURETTAGE/HYSTEROSCOPY WITH MYOSURE;  Surgeon: Marilynn Nest, DO;  Location: [facility] ORS;  Service: Gynecology;  Laterality: N/A;  Polypectomy    DILATION AND CURETTAGE OF UTERUS  1983   [facility]   ERCP N/A 09/15/2021   Procedure: ENDOSCOPIC RETROGRADE CHOLANGIOPANCREATOGRAPHY (ERCP);  Surgeon: Rosalie Kitchens, MD;  Location: [facility] ENDOSCOPY;  Service: Endoscopy;  Laterality: N/A;   INNER EAR SURGERY Right 2000's   LEFT HEART CATH AND CORONARY ANGIOGRAPHY N/A 04/17/2017   Procedure: Left Heart Cath and Coronary Angiography;  Surgeon: Dann Candyce RAMAN, MD;  Location: [facility] INVASIVE CV LAB;  Service: Cardiovascular;  Laterality: N/A;   PLANTAR FASCIA RELEASE Right 07/2015   REMOVAL OF STONES  09/15/2021   Procedure: REMOVAL OF STONES;  Surgeon: Rosalie Kitchens, MD;  Location: [facility] ENDOSCOPY;  Service: Endoscopy;;   SPHINCTEROTOMY  09/15/2021   Procedure: ANNETT;  Surgeon: Rosalie Kitchens, MD;  Location: [facility] ENDOSCOPY;  Service: Endoscopy;;   TUBAL LIGATION  1985   ULNAR NERVE TRANSPOSITION Right    Family History  Problem Relation Age of Onset   Lung cancer Father    Alcohol abuse Father    Cancer Father    Early death Father    Kidney failure Mother        s/p renal Tx   Congestive Heart Failure Mother    Heart disease Mother    Kidney disease Mother    Obesity Mother    Atrial fibrillation Brother    Alcohol abuse Brother    Sudden Cardiac Death Daughter        cardiac arrest   Cardiomyopathy Daughter    Kassie Parkinson White syndrome Grandchild         granddaughter   Anxiety disorder Sister    Miscarriages / [place] Sister    Outpatient Medications Prior to Visit  Medication Sig Dispense Refill   acetaminophen  (TYLENOL ) 500 MG tablet Take 2 tablets (1,000 mg total) by mouth 3 (three) times daily as needed for mild pain. 30 tablet 0   albuterol  (VENTOLIN  HFA) 108 (90 Base) MCG/ACT inhaler Inhale 2 puffs into the lungs every 6 (six) hours as needed for wheezing or shortness of breath. 8 g 0   diltiazem  (CARDIZEM ) 30 MG tablet TAKE 1 TABLET EVERY 4 HOURS AS NEEDED FOR AFIB HEART RATE >100 AS LONG AS TOP BP >100. 270 tablet 1    divalproex  (DEPAKOTE  ER) 250 MG 24 hr tablet Take 250 mg by mouth daily.     dofetilide  (TIKOSYN ) 500 MCG capsule TAKE 1 CAPSULE BY MOUTH 2 TIMES DAILY. 180 capsule 1   DULoxetine  (CYMBALTA ) 60 MG capsule Take 2 capsules (120 mg total) by mouth daily. 60 capsule 1   ELIQUIS  5 MG TABS tablet TAKE 1 TABLET BY MOUTH TWICE A DAY 180 tablet 3   fluticasone  (FLONASE ) 50 MCG/ACT nasal spray SPRAY 2 SPRAYS INTO EACH NOSTRIL EVERY DAY 48 mL 3   hydrOXYzine  (VISTARIL ) 25 MG capsule Take 25 mg by mouth at bedtime.     ipratropium-albuterol  (DUONEB) 0.5-2.5 (3) MG/3ML SOLN Take 3 mLs by nebulization every 4 (four) hours as needed (shortness of breath or wheezing). 120 mL 0   LATUDA  60 MG TABS Take 2 tablets (120 mg total) by mouth at bedtime. 30 tablet 1   metoprolol  tartrate (LOPRESSOR ) 50 MG tablet TAKE 1 TABLET  BY MOUTH TWICE A DAY 180 tablet 3   Misc. Devices MISC Nebulizer machine with tubing 1 each 0   potassium chloride  SA (KLOR-CON  M) 20 MEQ tablet Take 1.5 tablets (30 mEq total) by mouth daily. 135 tablet 1   GEMTESA 75 MG TABS Take 1 tablet by mouth daily.     No facility-administered medications prior to visit.   Allergies  Allergen Reactions   Codeine Nausea And Vomiting, Other (See Comments) and Nausea Only   Objective:   Today's Vitals   06/18/24 1513  BP: 118/82  Pulse: 80  Temp: 97.8 F (36.6 C)  TempSrc: Temporal  SpO2: 94%  Weight: (!) 321 lb (145.6 kg)  Height: 5' 7 (1.702 m)   Body mass index is 50.28 kg/m.   General: Well developed, well nourished. No acute distress. HEENT: Normocephalic, non-traumatic. PERRL, EOMI. Conjunctiva clear. External ears normal. EAC and   TMs normal bilaterally. Nose clear without congestion or rhinorrhea. Mucous membranes moist.   Oropharynx clear. Edentulous in maxilla. Lower incisors and bicuspids are all that remain and these are   worn down to the gums. Neck: Supple. No lymphadenopathy. No thyromegaly. Lungs: Clear to auscultation  bilaterally. No wheezing, rales or rhonchi. CV: RRR without murmurs or rubs. Pulses 2+ bilaterally. Abdomen: Soft, non-tender. Bowel sounds positive, normal pitch and frequency. No hepatosplenomegaly.   No rebound or guarding. Extremities: Full ROM. No joint swelling or tenderness. No edema noted. Skin: Warm and dry. No rashes. Psych: Alert and oriented. Normal mood and affect.  Health Maintenance Due  Topic Date Due   Hepatitis C Screening  Never done   Pneumococcal Vaccine: 50+ Years (1 of 2 - PCV) Never done   MAMMOGRAM  08/25/2023     Assessment & Plan:   Problem List Items Addressed This Visit       Cardiovascular and Mediastinum   Migraine   Stable. I will renew zolmitriptan  5 mg PRN migraine.      Relevant Medications   zolmitriptan  (ZOMIG ) 5 MG tablet   Other Relevant Orders   MM DIGITAL SCREENING BILATERAL   Paroxysmal atrial fibrillation (HCC)   Appears to be in a normal sinus rhythm. S/p ablation. Continue dofetilide  (Tikosyn ) 500 mcg bid for rhythm control, diltiazem  30 mg PRN tachycardia and metoprolol  tartrate 50 mg bid for rate control. Continue apixaban  5 mg bid for stroke prevention.        Respiratory   OSA (obstructive sleep apnea)   Regina Morales is having marked daytime somnolence, but admits she is not using her CPAP. She notes her dog barks at the unit when she wears this at night. I discussed with her about approaches to desensitize her dog to the units use, so she could return to using this.        Digestive   GERD (gastroesophageal reflux disease)   I will try her on a course of omeprazole  to see if we can reduce her GERD symptoms.      Relevant Medications   omeprazole  (PRILOSEC) 20 MG capsule     Other   Annual physical exam - Primary   Overall health is fair. Recommend regular exercise. Discussed recommended screenings and immunizations.       Numbness of feet   I will check screening labs for potential causes of this. Her numbness may  relate to her spinal stenosis.      Relevant Orders   Basic metabolic panel with GFR   Folate   Hemoglobin A1c  Vitamin B12   Other Visit Diagnoses       Encounter for hepatitis C screening test for low risk patient       Relevant Orders   HCV Ab w Reflex to Quant PCR     Encounter for screening mammogram for malignant neoplasm of breast           Return in about 6 months (around 12/19/2024) for Reassessment.   Garnette CHRISTELLA Simpler, MD

## 2024-06-18 NOTE — Assessment & Plan Note (Signed)
 Overall health is fair. Recommend regular exercise. Discussed recommended screenings and immunizations.

## 2024-06-19 LAB — BASIC METABOLIC PANEL WITH GFR
BUN: 11 mg/dL (ref 7–25)
CO2: 25 mmol/L (ref 20–32)
Calcium: 8.9 mg/dL (ref 8.6–10.4)
Chloride: 106 mmol/L (ref 98–110)
Creat: 0.75 mg/dL (ref 0.50–1.05)
Glucose, Bld: 112 mg/dL — ABNORMAL HIGH (ref 65–99)
Potassium: 4 mmol/L (ref 3.5–5.3)
Sodium: 140 mmol/L (ref 135–146)
eGFR: 91 mL/min/1.73m2 (ref >=60)

## 2024-06-19 LAB — HEMOGLOBIN A1C
Hgb A1c MFr Bld: 6.1 % — ABNORMAL HIGH (ref <5.7)
Mean Plasma Glucose: 128 mg/dL
eAG (mmol/L): 7.1 mmol/L

## 2024-06-19 LAB — FOLATE: Folate: 7 ng/mL

## 2024-06-19 LAB — VITAMIN B12: Vitamin B-12: 443 pg/mL (ref 200–1100)

## 2024-06-21 ENCOUNTER — Encounter: Payer: Self-pay | Admitting: Family Medicine

## 2024-06-21 ENCOUNTER — Ambulatory Visit: Payer: Self-pay | Admitting: Family Medicine

## 2024-06-21 ENCOUNTER — Encounter

## 2024-06-21 DIAGNOSIS — R7303 Prediabetes: Secondary | ICD-10-CM | POA: Insufficient documentation

## 2024-06-21 LAB — HCV AB W REFLEX TO QUANT PCR: HCV Ab: NONREACTIVE

## 2024-06-21 LAB — HCV INTERPRETATION

## 2024-06-22 ENCOUNTER — Telehealth: Payer: Self-pay

## 2024-06-22 ENCOUNTER — Other Ambulatory Visit (HOSPITAL_COMMUNITY): Payer: Self-pay

## 2024-06-22 NOTE — Telephone Encounter (Signed)
 Pharmacy Patient Advocate Encounter  Received notification from [facility] that Prior Authorization for Zolmitriptan  5 tabs has been APPROVED from 06/22/24 to 06/22/25. Ran test claim, Copay is $4.00. This test claim was processed through [facility]- copay amounts may vary at other pharmacies due to pharmacy/plan contracts, or as the patient moves through the different stages of their insurance plan.   PA #/Case ID/Reference #: # [id number]

## 2024-06-22 NOTE — Telephone Encounter (Signed)
 Pharmacy Patient Advocate Encounter   Received notification from Onbase that prior authorization for Zolmitriptan  5 tabs is required/requested.   Insurance verification completed.   The patient is insured through [facility] .   Per test claim: PA required; PA submitted to above mentioned insurance via Latent Key/confirmation #/EOC [id number] Status is pending

## 2024-07-20 ENCOUNTER — Ambulatory Visit (HOSPITAL_COMMUNITY): Attending: Internal Medicine | Admitting: Internal Medicine

## 2024-07-20 ENCOUNTER — Encounter (HOSPITAL_COMMUNITY): Payer: Self-pay

## 2024-07-20 NOTE — Progress Notes (Incomplete)
 Primary Care Physician: Thedora Garnette HERO, MD Primary Cardiologist: Maude Emmer, MD Electrophysiologist: Will Gladis Norton, MD     Referring Physician: Dr. Camnitz     Regina Morales is a 63 y.o. female with a history of HTN, OSA on CPAP, DVT/PE, GERD, fibromyalgia, and atrial fibrillation who presents for consultation in the [facility]. Patient previously on Tikosyn  but during visit with cardiology on 7/30 noted her insurance stopped paying for Tikosyn  6 months prior. She has been having episodes of Afib several times a week. Patient is on Eliquis  for a CHADS2VASC score of 2.  On evaluation today, she is currently in NSR. She does admit that her Apple Watch has been confirming paroxysmal episodes of Afib. She feels like she cannot swallow and heart races when in Afib. Episodes are lasting for a few hours at a time. She stopped Tikosyn  around April of this year due to being told it was going to cost her $1,500 a month. No missed doses or issues with Eliquis .   On follow up 07/21/23, patient is here for restart of Tikosyn  admission. She was previously on Tikosyn  500 mcg BID. No new medications since last OV. No benadryl  use. No missed doses of anticoagulant.   On follow up 09/18/23, she is currently in NSR. S/p Tikosyn  admission 9/16-19/24. She did not require cardioversion and discharged on 500 mcg dosage. She has had no episodes of Afib since hospital discharge. Patient tells me she has missed several appointments due to oversleeping secondary to work schedule of second shift. She has missed the past two evening doses due to having missed multiple appointments and about to run out of Tikosyn .  On follow up 10/09/23, she is currently in NSR. She is here for 1 month Tikosyn  surveillance. She has had no episodes of Afib since last office visit. She ran out of potassium about one week ago. Due to work schedule, she takes Tikosyn  and Eliquis  at 69 and 12. No missed  doses of Tikosyn  or Eliquis .   On follow up 01/08/24, she is currently here for Tikosyn  surveillance. She is currently in NSR. She has had 2 episodes of Afib since last office visit. She notes they resolved on their own after ~6 hours. She is currently on Tikosyn  500 mcg BID. No missed doses of Tikosyn  or Eliquis .   On follow up 04/19/24, she is here for Tikosyn  surveillance. She is currently in NSR. She has had 1 episode of Afib since last office visit. The episode was back in April and lasted for 30 minutes. She is about to start a new job and it will be night shift; she will be starting orientation on Monday for the next 8-10 weeks. No missed doses of Tikosyn  500 mcg BID or Eliquis .  On follow up 07/20/24, patient is here for Tikosyn  surveillance. Patient has had overall *** Afib burden since last office visit. No missed doses of Tikosyn  or Eliquis .   Today, she denies symptoms of palpitations, chest pain, shortness of breath, orthopnea, PND, lower extremity edema, dizziness, presyncope, syncope, snoring, daytime somnolence, bleeding, or neurologic sequela. The patient is tolerating medications without difficulties and is otherwise without complaint today.    Atrial Fibrillation Risk Factors:  she does have symptoms or diagnosis of sleep apnea. she is compliant with CPAP therapy.  she has a BMI of There is no height or weight on file to calculate BMI.. There were no vitals filed for this visit.    Current  Outpatient Medications  Medication Sig Dispense Refill   acetaminophen  (TYLENOL ) 500 MG tablet Take 2 tablets (1,000 mg total) by mouth 3 (three) times daily as needed for mild pain. 30 tablet 0   albuterol  (VENTOLIN  HFA) 108 (90 Base) MCG/ACT inhaler Inhale 2 puffs into the lungs every 6 (six) hours as needed for wheezing or shortness of breath. 8 g 0   diltiazem  (CARDIZEM ) 30 MG tablet TAKE 1 TABLET EVERY 4 HOURS AS NEEDED FOR AFIB HEART RATE >100 AS LONG AS TOP BP >100. 270 tablet 1    divalproex  (DEPAKOTE  ER) 250 MG 24 hr tablet Take 250 mg by mouth daily.     dofetilide  (TIKOSYN ) 500 MCG capsule TAKE 1 CAPSULE BY MOUTH 2 TIMES DAILY. 180 capsule 1   DULoxetine  (CYMBALTA ) 60 MG capsule Take 2 capsules (120 mg total) by mouth daily. 60 capsule 1   ELIQUIS  5 MG TABS tablet TAKE 1 TABLET BY MOUTH TWICE A DAY 180 tablet 3   fluticasone  (FLONASE ) 50 MCG/ACT nasal spray SPRAY 2 SPRAYS INTO EACH NOSTRIL EVERY DAY 48 mL 3   hydrOXYzine  (VISTARIL ) 25 MG capsule Take 25 mg by mouth at bedtime.     ipratropium-albuterol  (DUONEB) 0.5-2.5 (3) MG/3ML SOLN Take 3 mLs by nebulization every 4 (four) hours as needed (shortness of breath or wheezing). 120 mL 0   LATUDA  60 MG TABS Take 2 tablets (120 mg total) by mouth at bedtime. 30 tablet 1   metoprolol  tartrate (LOPRESSOR ) 50 MG tablet TAKE 1 TABLET BY MOUTH TWICE A DAY 180 tablet 3   Misc. Devices MISC Nebulizer machine with tubing 1 each 0   omeprazole  (PRILOSEC) 20 MG capsule Take 1 capsule (20 mg total) by mouth daily. 30 capsule 3   potassium chloride  SA (KLOR-CON  M) 20 MEQ tablet Take 1.5 tablets (30 mEq total) by mouth daily. 135 tablet 1   zolmitriptan  (ZOMIG ) 5 MG tablet Take 1 tablet (5 mg total) by mouth as needed for migraine. 10 tablet 2   No current facility-administered medications for this visit.    Atrial Fibrillation Management history:  Previous antiarrhythmic drugs: flecainide  failure, tikosyn   Previous cardioversions: none Previous ablations: none Anticoagulation history: Eliquis   ROS- All systems are reviewed and negative except as per the HPI above.  Physical Exam: There were no vitals taken for this visit.  GEN- The patient is well appearing, alert and oriented x 3 today.   Neck - no JVD or carotid bruit noted Lungs- Clear to ausculation bilaterally, normal work of breathing Heart- ***Regular rate and rhythm, no murmurs, rubs or gallops, PMI not laterally displaced Extremities- no clubbing, cyanosis, or  edema Skin - no rash or ecchymosis noted    EKG today demonstrates  ***  Echo 06/15/20 demonstrated   1. Left ventricular ejection fraction, by estimation, is 55 to 60%. The  left ventricle has normal function. The left ventricle has no regional  wall motion abnormalities. Left ventricular diastolic parameters were  normal.   2. Right ventricular systolic function is normal. The right ventricular  size is normal. Tricuspid regurgitation signal is inadequate for assessing  PA pressure.   3. The mitral valve is normal in structure. Trivial mitral valve  regurgitation. No evidence of mitral stenosis.   4. The aortic valve is tricuspid. Aortic valve regurgitation is not  visualized. No aortic stenosis is present.   Comparison(s): 04/17/17 EF 55-60%.   ASSESSMENT & PLAN CHA2DS2-VASc Score =    The patient's score is based upon:  ASSESSMENT AND PLAN: Paroxysmal Atrial Fibrillation (ICD10:  I48.0) The patient's CHA2DS2-VASc score is  , indicating a  % annual risk of stroke.   S/p Tikosyn  9/16-19/24.  Patient is currently in ***.   High risk medication monitoring (ICD10: U5195107) Patient requires ongoing monitoring for anti-arrhythmic medication which has the potential to cause life threatening arrhythmias or AV block. Qtc stable. Continue Tikosyn  500 mcg BID. Bmet and mag drawn today.     Continue Eliquis  5 mg BID as per patient preference.     Follow up 6 months for Tikosyn  surveillance.    Terra Pac, PA-C  Afib Clinic [facility] [address] [place], [place] [address] [phone]

## 2024-07-30 ENCOUNTER — Ambulatory Visit: Payer: Self-pay | Admitting: Nurse Practitioner

## 2024-07-30 ENCOUNTER — Ambulatory Visit: Admitting: Nurse Practitioner

## 2024-07-30 ENCOUNTER — Encounter: Payer: Self-pay | Admitting: Nurse Practitioner

## 2024-07-30 ENCOUNTER — Ambulatory Visit: Payer: Self-pay

## 2024-07-30 ENCOUNTER — Ambulatory Visit (INDEPENDENT_AMBULATORY_CARE_PROVIDER_SITE_OTHER)

## 2024-07-30 VITALS — BP 132/80 | HR 97 | Temp 96.9°F | Ht 67.0 in | Wt 318.6 lb

## 2024-07-30 DIAGNOSIS — W540XXA Bitten by dog, initial encounter: Secondary | ICD-10-CM

## 2024-07-30 DIAGNOSIS — M79674 Pain in right toe(s): Secondary | ICD-10-CM

## 2024-07-30 MED ORDER — AMOXICILLIN-POT CLAVULANATE 875-125 MG PO TABS: 1.0000 | ORAL_TABLET | Freq: Two times a day (BID) | ORAL | 0 refills | Status: DC

## 2024-07-30 NOTE — Telephone Encounter (Signed)
 Appointment made for today 07/30/2024 at 3:40pm at patient's PCP office with Tinnie Harada NP  FYI Only or Action Required?: FYI only for provider.  Patient was last seen in primary care on 06/18/2024 by Thedora Garnette HERO, MD.  Called Nurse Triage reporting Animal Bite.  Symptoms began 2 nights ago.  Interventions attempted: Other: cleaned the toe.  Symptoms are: gradually worsening.  Triage Disposition: See HCP Within 4 Hours (Or PCP Triage)  Patient/caregiver understands and will follow disposition?: Yes          Copied from CRM #[id number]. Topic: Clinical - Red Word Triage >> Jul 30, 2024  2:01 PM Pinkey ORN wrote: Red Word that prompted transfer to Nurse Triage: Dog Bite >> Jul 30, 2024  2:02 PM Pinkey ORN wrote: Patient states her dog bit her on her right foot 2 nights ago. Reason for Disposition  Looks infected (spreading redness, red streak, pus)  Answer Assessment - Initial Assessment Questions 1. APPEARANCE What does it look like?  (e.g., abrasion, bruise, puncture)      Puncture on bottom and red/swollen 2. SIZE: How big is the bite? (e.g., inches, cm; or compare to size of coin, pea, grape, ping pong ball)      On 4th toe 3. LOCATION: Where is the bite located?      4th digit on right foot 4. ONSET: When did the bite happen? (e.g., minutes, hours ago)      Two nights ago 5. ANIMAL: What type of animal caused the bite? Is the injury from a bite or a claw? If the animal is a dog or a cat, ask: Was it a pet or a stray? Was it acting ill or behaving strangely?     Pet dog 6. RABIES VACCINE: For dog or cat bites, ask: Do you know if the pet is vaccinated against rabies?  (e.g., yes, no, overdue for rabies shot, unknown)     Patient states up to date 7. CIRCUMSTANCES: Tell me how this happened.      Two nights ago, patient was sleeping and thinks she startled her dog and the dog bit her 4th digit on her right foot 8. TETANUS: When was your  last tetanus booster?     unsure  Protocols used: Animal Bite-A-AH

## 2024-07-30 NOTE — Patient Instructions (Signed)
 It was great to see you!  Start augmentin  twice a day for 10 days   Wash the wound with soap and water twice a day   Call or go the ER if you notice any more swelling, pain, drainage, or fevers  Let's follow-up in 1 week  Take care,  Tinnie Harada, NP

## 2024-07-30 NOTE — Progress Notes (Signed)
 Acute Office Visit  Subjective:     Patient ID: Regina Morales, female    DOB: June 21, 1961, 63 y.o.   MRN: [id number]  Chief Complaint  Patient presents with   Animal Bite    Dog bite to right foot-4th toe    HPI Discussed the use of AI scribe software for clinical note transcription with the patient, who gave verbal consent to proceed.  History of Present Illness   Regina Morales is a 63 year old female who presents with a dog bite on her fourth toe.   She sustained the dog bite on her fourth toe while sleeping on Tuesday. The dog, startled by her movement, bit and shook her toe, causing significant pain, bruising, and bleeding. She manages the pain with Tylenol , which is effective. She has not applied topical treatments or washed the wound since the incident.  The dog is up to date on all vaccinations. She has no allergies to antibiotics and no history of yeast infections from antibiotics. She often walks barefoot in the house. No pain in other toes. No fever. She is not diabetic.      ROS See pertinent positives and negatives per HPI.     Objective:    BP 132/80 (BP Location: Left Arm, Patient Position: Sitting, Cuff Size: Large)   Pulse 97   Temp (!) 96.9 F (36.1 C)   Ht 5' 7 (1.702 m)   Wt (!) 318 lb 9.6 oz (144.5 kg)   SpO2 97%   BMI 49.90 kg/m    Physical Exam Vitals and nursing note reviewed.  Constitutional:      General: She is not in acute distress.    Appearance: Normal appearance.  HENT:     Head: Normocephalic.  Eyes:     Conjunctiva/sclera: Conjunctivae normal.  Pulmonary:     Effort: Pulmonary effort is normal.  Musculoskeletal:        General: Tenderness present.     Cervical back: Normal range of motion.  Skin:    General: Skin is warm.     Findings: Bruising and erythema present.  Neurological:     General: No focal deficit present.     Mental Status: She is alert and oriented to person, place, and time.  Psychiatric:        Mood  and Affect: Mood normal.        Behavior: Behavior normal.        Thought Content: Thought content normal.        Judgment: Judgment normal.      Media Information  Document Information       Assessment & Plan:   Pain in right toe, dog bite  She has a dog bite on her right fourth toe with redness, bruising, and swelling, but no fever. The dog is vaccinated. The wound is not open, and she does not have diabetes. Prescribe Augmentin  twice daily for 10 days. Advise washing the wound with soap and water daily and applying ice to reduce swelling. Continue Tylenol  as needed for pain. Order an x-ray to rule out fracture. Instruct to call or visit the ER if symptoms worsen. Schedule a follow-up in one week.       Meds ordered this encounter  Medications   amoxicillin -clavulanate (AUGMENTIN ) 875-125 MG tablet    Sig: Take 1 tablet by mouth 2 (two) times daily.    Dispense:  20 tablet    Refill:  0    Return in about 1  week (around 08/06/2024) for dog bite re-evaluation with me or Dr. Thedora.  Tinnie DELENA Harada, NP

## 2024-08-09 ENCOUNTER — Encounter (HOSPITAL_COMMUNITY): Payer: Self-pay | Admitting: Internal Medicine

## 2024-08-09 ENCOUNTER — Ambulatory Visit (HOSPITAL_COMMUNITY)
Admission: RE | Admit: 2024-08-09 | Discharge: 2024-08-09 | Disposition: A | Discharge status: Home / Self Care | Source: Ambulatory Visit | Attending: Internal Medicine | Admitting: Internal Medicine

## 2024-08-09 VITALS — BP 128/90 | HR 112 | Ht 67.0 in | Wt 318.0 lb

## 2024-08-09 DIAGNOSIS — M797 Fibromyalgia: Secondary | ICD-10-CM | POA: Insufficient documentation

## 2024-08-09 DIAGNOSIS — Z86718 Personal history of other venous thrombosis and embolism: Secondary | ICD-10-CM | POA: Diagnosis not present

## 2024-08-09 DIAGNOSIS — Z7901 Long term (current) use of anticoagulants: Secondary | ICD-10-CM | POA: Insufficient documentation

## 2024-08-09 DIAGNOSIS — I480 Paroxysmal atrial fibrillation: Secondary | ICD-10-CM | POA: Insufficient documentation

## 2024-08-09 DIAGNOSIS — Z5181 Encounter for therapeutic drug level monitoring: Secondary | ICD-10-CM | POA: Insufficient documentation

## 2024-08-09 DIAGNOSIS — Z5971 Insufficient health insurance coverage: Secondary | ICD-10-CM | POA: Insufficient documentation

## 2024-08-09 DIAGNOSIS — Z79899 Other long term (current) drug therapy: Secondary | ICD-10-CM | POA: Insufficient documentation

## 2024-08-09 DIAGNOSIS — Z86711 Personal history of pulmonary embolism: Secondary | ICD-10-CM | POA: Diagnosis not present

## 2024-08-09 DIAGNOSIS — G4733 Obstructive sleep apnea (adult) (pediatric): Secondary | ICD-10-CM | POA: Insufficient documentation

## 2024-08-09 DIAGNOSIS — K219 Gastro-esophageal reflux disease without esophagitis: Secondary | ICD-10-CM | POA: Diagnosis not present

## 2024-08-09 DIAGNOSIS — I10 Essential (primary) hypertension: Secondary | ICD-10-CM | POA: Diagnosis not present

## 2024-08-09 NOTE — Progress Notes (Signed)
 Primary Care Physician: Thedora Garnette HERO, MD Primary Cardiologist: Maude Emmer, MD Electrophysiologist: Will Gladis Norton, MD     Referring Physician: Dr. Camnitz     Regina Morales is a 63 y.o. female with a history of HTN, OSA on CPAP, DVT/PE, GERD, fibromyalgia, and atrial fibrillation who presents for consultation in the [facility]. Patient previously on Tikosyn  but during visit with cardiology on 7/30 noted her insurance stopped paying for Tikosyn  6 months prior. She has been having episodes of Afib several times a week. Patient is on Eliquis  for a CHADS2VASC score of 2.  On evaluation today, she is currently in NSR. She does admit that her Apple Watch has been confirming paroxysmal episodes of Afib. She feels like she cannot swallow and heart races when in Afib. Episodes are lasting for a few hours at a time. She stopped Tikosyn  around April of this year due to being told it was going to cost her $1,500 a month. No missed doses or issues with Eliquis .   On follow up 07/21/23, patient is here for restart of Tikosyn  admission. She was previously on Tikosyn  500 mcg BID. No new medications since last OV. No benadryl  use. No missed doses of anticoagulant.   On follow up 09/18/23, she is currently in NSR. S/p Tikosyn  admission 9/16-19/24. She did not require cardioversion and discharged on 500 mcg dosage. She has had no episodes of Afib since hospital discharge. Patient tells me she has missed several appointments due to oversleeping secondary to work schedule of second shift. She has missed the past two evening doses due to having missed multiple appointments and about to run out of Tikosyn .  On follow up 10/09/23, she is currently in NSR. She is here for 1 month Tikosyn  surveillance. She has had no episodes of Afib since last office visit. She ran out of potassium about one week ago. Due to work schedule, she takes Tikosyn  and Eliquis  at 54 and 12. No missed  doses of Tikosyn  or Eliquis .   On follow up 01/08/24, she is currently here for Tikosyn  surveillance. She is currently in NSR. She has had 2 episodes of Afib since last office visit. She notes they resolved on their own after ~6 hours. She is currently on Tikosyn  500 mcg BID. No missed doses of Tikosyn  or Eliquis .   On follow up 04/19/24, she is here for Tikosyn  surveillance. She is currently in NSR. She has had 1 episode of Afib since last office visit. The episode was back in April and lasted for 30 minutes. She is about to start a new job and it will be night shift; she will be starting orientation on Monday for the next 8-10 weeks. No missed doses of Tikosyn  500 mcg BID or Eliquis .  On follow up 08/09/24, patient is here for Tikosyn  surveillance. Patient has had overall low Afib burden since last office visit. No missed doses of Tikosyn  or Eliquis .   Today, she denies symptoms of palpitations, chest pain, shortness of breath, orthopnea, PND, lower extremity edema, dizziness, presyncope, syncope, snoring, daytime somnolence, bleeding, or neurologic sequela. The patient is tolerating medications without difficulties and is otherwise without complaint today.    Atrial Fibrillation Risk Factors:  she does have symptoms or diagnosis of sleep apnea. she is compliant with CPAP therapy.  she has a BMI of Body mass index is 49.81 kg/m.SABRA Filed Weights   08/09/24 1433  Weight: (!) 144.2 kg      Current  Outpatient Medications  Medication Sig Dispense Refill   acetaminophen  (TYLENOL ) 500 MG tablet Take 2 tablets (1,000 mg total) by mouth 3 (three) times daily as needed for mild pain. 30 tablet 0   albuterol  (VENTOLIN  HFA) 108 (90 Base) MCG/ACT inhaler Inhale 2 puffs into the lungs every 6 (six) hours as needed for wheezing or shortness of breath. 8 g 0   amoxicillin -clavulanate (AUGMENTIN ) 875-125 MG tablet Take 1 tablet by mouth 2 (two) times daily. 20 tablet 0   diltiazem  (CARDIZEM ) 30 MG tablet  TAKE 1 TABLET EVERY 4 HOURS AS NEEDED FOR AFIB HEART RATE >100 AS LONG AS TOP BP >100. 270 tablet 1   divalproex  (DEPAKOTE  ER) 250 MG 24 hr tablet Take 250 mg by mouth daily.     dofetilide  (TIKOSYN ) 500 MCG capsule TAKE 1 CAPSULE BY MOUTH 2 TIMES DAILY. 180 capsule 1   DULoxetine  (CYMBALTA ) 60 MG capsule Take 2 capsules (120 mg total) by mouth daily. 60 capsule 1   ELIQUIS  5 MG TABS tablet TAKE 1 TABLET BY MOUTH TWICE A DAY 180 tablet 3   fluticasone  (FLONASE ) 50 MCG/ACT nasal spray SPRAY 2 SPRAYS INTO EACH NOSTRIL EVERY DAY 48 mL 3   hydrOXYzine  (VISTARIL ) 25 MG capsule Take 25 mg by mouth at bedtime.     ipratropium-albuterol  (DUONEB) 0.5-2.5 (3) MG/3ML SOLN Take 3 mLs by nebulization every 4 (four) hours as needed (shortness of breath or wheezing). 120 mL 0   LATUDA  60 MG TABS Take 2 tablets (120 mg total) by mouth at bedtime. 30 tablet 1   metoprolol  tartrate (LOPRESSOR ) 50 MG tablet TAKE 1 TABLET BY MOUTH TWICE A DAY 180 tablet 3   Misc. Devices MISC Nebulizer machine with tubing 1 each 0   omeprazole  (PRILOSEC) 20 MG capsule Take 1 capsule (20 mg total) by mouth daily. 30 capsule 3   potassium chloride  SA (KLOR-CON  M) 20 MEQ tablet Take 1.5 tablets (30 mEq total) by mouth daily. 135 tablet 1   zolmitriptan  (ZOMIG ) 5 MG tablet Take 1 tablet (5 mg total) by mouth as needed for migraine. 10 tablet 2   No current facility-administered medications for this encounter.    Atrial Fibrillation Management history:  Previous antiarrhythmic drugs: flecainide  failure, tikosyn   Previous cardioversions: none Previous ablations: none Anticoagulation history: Eliquis   ROS- All systems are reviewed and negative except as per the HPI above.  Physical Exam: BP (!) 128/90   Pulse (!) 112   Ht 5' 7 (1.702 m)   Wt (!) 144.2 kg   BMI 49.81 kg/m   GEN- The patient is well appearing, alert and oriented x 3 today.   Neck - no JVD or carotid bruit noted Lungs- Clear to ausculation bilaterally,  normal work of breathing Heart- Regular tachycardic rate and rhythm, no murmurs, rubs or gallops, PMI not laterally displaced Extremities- no clubbing, cyanosis, or edema Skin - no rash or ecchymosis noted   EKG today demonstrates  Vent. rate 112 BPM PR interval 176 ms QRS duration 86 ms QT/QTcB 340/464 ms P-R-T axes 70 -33 89 Sinus tachycardia with occasional Premature ventricular complexes Left axis deviation Inferior infarct (cited on or before 18-Sep-2023) Abnormal ECG When compared with ECG of 19-Apr-2024 13:39, Premature ventricular complexes are now Present  Echo 06/15/20 demonstrated   1. Left ventricular ejection fraction, by estimation, is 55 to 60%. The  left ventricle has normal function. The left ventricle has no regional  wall motion abnormalities. Left ventricular diastolic parameters were  normal.  2. Right ventricular systolic function is normal. The right ventricular  size is normal. Tricuspid regurgitation signal is inadequate for assessing  PA pressure.   3. The mitral valve is normal in structure. Trivial mitral valve  regurgitation. No evidence of mitral stenosis.   4. The aortic valve is tricuspid. Aortic valve regurgitation is not  visualized. No aortic stenosis is present.   Comparison(s): 04/17/17 EF 55-60%.   CHA2DS2-VASc Score = 2  The patient's score is based upon: CHF History: 0 HTN History: 1 Diabetes History: 0 Stroke History: 0 Vascular Disease History: 0 Age Score: 0 Gender Score: 1       ASSESSMENT AND PLAN: Paroxysmal Atrial Fibrillation (ICD10:  I48.0) The patient's CHA2DS2-VASc score is 2, indicating a 2.2% annual risk of stroke.   S/p Tikosyn  9/16-19/24.  Patient is currently in NSR. She notes about taking hydroxyzine  for sleep and I advised against this due to potential QT prolongation with Tikosyn . She will stop it and discuss with provider for alternative.    High risk medication monitoring (ICD10: J342684) Patient  requires ongoing monitoring for anti-arrhythmic medication which has the potential to cause life threatening arrhythmias or AV block. Qtc stable. Continue Tikosyn  500 mcg BID. Bmet and mag drawn today.     Continue Eliquis  5 mg BID as per patient preference.     Follow up 4 months for Tikosyn  surveillance.    Terra Pac, PA-C  [facility] [address] [place], [place] [address] [phone]

## 2024-08-10 ENCOUNTER — Ambulatory Visit: Admitting: Family Medicine

## 2024-08-10 ENCOUNTER — Ambulatory Visit (HOSPITAL_COMMUNITY): Payer: Self-pay | Admitting: Internal Medicine

## 2024-08-10 ENCOUNTER — Telehealth (HOSPITAL_COMMUNITY): Payer: Self-pay | Admitting: *Deleted

## 2024-08-10 ENCOUNTER — Encounter: Payer: Self-pay | Admitting: Family Medicine

## 2024-08-10 VITALS — BP 122/76 | HR 78 | Temp 97.3°F | Ht 67.0 in | Wt 317.6 lb

## 2024-08-10 DIAGNOSIS — W540XXD Bitten by dog, subsequent encounter: Secondary | ICD-10-CM

## 2024-08-10 DIAGNOSIS — B3731 Acute candidiasis of vulva and vagina: Secondary | ICD-10-CM

## 2024-08-10 LAB — BASIC METABOLIC PANEL WITH GFR
BUN/Creatinine Ratio: 15 (ref 12–28)
BUN: 13 mg/dL (ref 8–27)
CO2: 18 mmol/L — ABNORMAL LOW (ref 20–29)
Calcium: 9.1 mg/dL (ref 8.7–10.3)
Chloride: 102 mmol/L (ref 96–106)
Creatinine, Ser: 0.84 mg/dL (ref 0.57–1.00)
Glucose: 180 mg/dL — ABNORMAL HIGH (ref 70–99)
Potassium: 3.8 mmol/L (ref 3.5–5.2)
Sodium: 138 mmol/L (ref 134–144)
eGFR: 79 mL/min/1.73 (ref >59)

## 2024-08-10 LAB — MAGNESIUM: Magnesium: 2 mg/dL (ref 1.6–2.3)

## 2024-08-10 MED ORDER — FLUCONAZOLE 150 MG PO TABS: 150.0000 mg | ORAL_TABLET | Freq: Once | ORAL | 0 refills | Status: DC

## 2024-08-10 NOTE — Progress Notes (Signed)
 [facility] [address] [place] [address] Dept: [phone] Dept Fax: [phone]  Office Visit  Subjective:    Patient ID: Regina Morales, female    DOB: 06/13/1961, 63 y.o..   MRN: [id number]  Chief Complaint  Patient presents with   Follow-up    1 week f/u dog bite. Has gotten better.  C/o having a yeast infection from antibiotics.    History of Present Illness:  Patient is in today for follow-up regarding a recent dog bite to her foot. She was seen on 9/26 by Ms. McElwee with the initial injury. Apparently, her Regina Morales was startled awake at night and bit her right foot. When she was seen, there were several bite wounds and concern for infection. She was prescribed Augmentin , which she has compelted. She feels her foot has healed. However, now she is noting some vaginal discharge and itching.  Past Medical History: Patient Active Problem List   Diagnosis Date Noted   Prediabetes 06/21/2024   Numbness of feet 06/18/2024   Annual physical exam 06/18/2024   Chronic obstructive pulmonary disease with acute exacerbation (HCC) 11/13/2023   Bipolar 2 disorder (HCC) 04/08/2023   Generalized anxiety disorder 04/08/2023   Bipolar 1 disorder (HCC) 03/11/2023   Psychogenic vomiting with nausea 03/11/2023   COVID-19 11/18/2022   Gastritis without bleeding 03/29/2022   Irritable bowel syndrome with diarrhea 03/29/2022   NAFLD (nonalcoholic fatty liver disease) 94/73/7976   Postcholecystectomy diarrhea 12/07/2021   Sensorineural hearing loss (SNHL) of both ears 12/07/2021   Urinary incontinence 12/07/2021   Epigastric abdominal pain 08/20/2021   Diverticulitis 06/08/2021   Lumbar degenerative disc disease 05/28/2021   Mild spinal stenosis at L4-L5 level 05/28/2021   Severe recurrent major depression without psychotic features (HCC) 04/30/2020   OSA (obstructive sleep apnea) 07/27/2019   Stage 3 severe COPD by GOLD  classification (HCC) 06/24/2019   Fibromyalgia 05/03/2019   Venous insufficiency of both lower extremities 04/29/2019   Encounter for monitoring dofetilide  therapy 09/15/2018   Endometrial polyp    Normocytic anemia 04/24/2014   Migraine 04/22/2014   Paroxysmal atrial fibrillation (HCC)    GERD (gastroesophageal reflux disease)    BMI 40.0-44.9, adult [facility])    Past Surgical History:  Procedure Laterality Date   APPENDECTOMY  1990's   BREAST CYST ASPIRATION Left 2019   @ [facility]   CHOLECYSTECTOMY N/A 09/14/2021   Procedure: LAPAROSCOPIC CHOLECYSTECTOMY WITH INTRAOPERATIVE CHOLANGIOGRAM;  Surgeon: Dasie Leonor CROME, MD;  Location: [facility] OR;  Service: General;  Laterality: N/A;   COLONOSCOPY     DILATATION & CURETTAGE/HYSTEROSCOPY WITH MYOSURE N/A 04/14/2017   Procedure: DILATATION & CURETTAGE/HYSTEROSCOPY WITH MYOSURE;  Surgeon: Marilynn Nest, DO;  Location: [facility] ORS;  Service: Gynecology;  Laterality: N/A;  Polypectomy   DILATION AND CURETTAGE OF UTERUS  1983   [facility]   ERCP N/A 09/15/2021   Procedure: ENDOSCOPIC RETROGRADE CHOLANGIOPANCREATOGRAPHY (ERCP);  Surgeon: Rosalie Kitchens, MD;  Location: [facility] ENDOSCOPY;  Service: Endoscopy;  Laterality: N/A;   INNER EAR SURGERY Right 2000's   LEFT HEART CATH AND CORONARY ANGIOGRAPHY N/A 04/17/2017   Procedure: Left Heart Cath and Coronary Angiography;  Surgeon: Dann Candyce RAMAN, MD;  Location: [facility] INVASIVE CV LAB;  Service: Cardiovascular;  Laterality: N/A;   PLANTAR FASCIA RELEASE Right 07/2015   REMOVAL OF STONES  09/15/2021   Procedure: REMOVAL OF STONES;  Surgeon: Rosalie Kitchens, MD;  Location[facility] ENDOSCOPY;  Service: Endoscopy;;   SPHINCTEROTOMY  09/15/2021   Procedure: SPHINCTEROTOMY;  Surgeon: Rosalie Kitchens, MD;  Location[facility] ENDOSCOPY;  Service: Endoscopy;;   TUBAL LIGATION  1985   ULNAR NERVE TRANSPOSITION Right    Family History  Problem Relation Age of Onset   Lung cancer Father    Alcohol abuse Father    Cancer Father    Early death Father     Kidney failure Mother        s/p renal Tx   Congestive Heart Failure Mother    Heart disease Mother    Kidney disease Mother    Obesity Mother    Atrial fibrillation Brother    Alcohol abuse Brother    Sudden Cardiac Death Daughter        cardiac arrest   Cardiomyopathy Daughter    Kassie Parkinson White syndrome Grandchild         granddaughter   Anxiety disorder Sister    Miscarriages / [place] Sister    Outpatient Medications Prior to Visit  Medication Sig Dispense Refill   acetaminophen  (TYLENOL ) 500 MG tablet Take 2 tablets (1,000 mg total) by mouth 3 (three) times daily as needed for mild pain. 30 tablet 0   albuterol  (VENTOLIN  HFA) 108 (90 Base) MCG/ACT inhaler Inhale 2 puffs into the lungs every 6 (six) hours as needed for wheezing or shortness of breath. 8 g 0   amoxicillin -clavulanate (AUGMENTIN ) 875-125 MG tablet Take 1 tablet by mouth 2 (two) times daily. 20 tablet 0   diltiazem  (CARDIZEM ) 30 MG tablet TAKE 1 TABLET EVERY 4 HOURS AS NEEDED FOR AFIB HEART RATE >100 AS LONG AS TOP BP >100. 270 tablet 1   divalproex  (DEPAKOTE  ER) 250 MG 24 hr tablet Take 250 mg by mouth daily.     dofetilide  (TIKOSYN ) 500 MCG capsule TAKE 1 CAPSULE BY MOUTH 2 TIMES DAILY. 180 capsule 1   DULoxetine  (CYMBALTA ) 60 MG capsule Take 2 capsules (120 mg total) by mouth daily. 60 capsule 1   ELIQUIS  5 MG TABS tablet TAKE 1 TABLET BY MOUTH TWICE A DAY 180 tablet 3   fluticasone  (FLONASE ) 50 MCG/ACT nasal spray SPRAY 2 SPRAYS INTO EACH NOSTRIL EVERY DAY 48 mL 3   hydrOXYzine  (VISTARIL ) 25 MG capsule Take 25 mg by mouth at bedtime.     ipratropium-albuterol  (DUONEB) 0.5-2.5 (3) MG/3ML SOLN Take 3 mLs by nebulization every 4 (four) hours as needed (shortness of breath or wheezing). 120 mL 0   LATUDA  60 MG TABS Take 2 tablets (120 mg total) by mouth at bedtime. 30 tablet 1   metoprolol  tartrate (LOPRESSOR ) 50 MG tablet TAKE 1 TABLET BY MOUTH TWICE A DAY 180 tablet 3   Misc. Devices MISC Nebulizer  machine with tubing 1 each 0   omeprazole  (PRILOSEC) 20 MG capsule Take 1 capsule (20 mg total) by mouth daily. 30 capsule 3   potassium chloride  SA (KLOR-CON  M) 20 MEQ tablet Take 1.5 tablets (30 mEq total) by mouth daily. 135 tablet 1   zolmitriptan  (ZOMIG ) 5 MG tablet Take 1 tablet (5 mg total) by mouth as needed for migraine. 10 tablet 2   No facility-administered medications prior to visit.   Allergies  Allergen Reactions   Codeine Nausea And Vomiting, Other (See Comments) and Nausea Only     Objective:   Today's Vitals   08/10/24 1345  BP: 122/76  Pulse: 78  Temp: (!) 97.3 F (36.3 C)  TempSrc: Temporal  SpO2: 98%  Weight: (!) 317 lb 9.6 oz (144.1 kg)  Height: 5' 7 (1.702 m)  Body mass index is 49.74 kg/m.   General: Well developed, well nourished. No acute distress. Extremities: The wounds to the right foot have improved. The previous bruising has resolved.  Psych: Alert and oriented. Normal mood and affect.  Health Maintenance Due  Topic Date Due   Pneumococcal Vaccine: 50+ Years (1 of 2 - PCV) Never done   Mammogram  08/25/2023     Assessment & Plan:   Problem List Items Addressed This Visit   None Visit Diagnoses       Dog bite, subsequent encounter    -  Primary   Improved and healing. Recommend ongoing expectant management at this point.     Yeast vaginitis       I will prescribe a single-dose course of fluconozole.       Return if symptoms worsen or fail to improve.   Garnette CHRISTELLA Simpler, MD

## 2024-08-10 NOTE — Telephone Encounter (Signed)
 Co-administering fluconazole and dofetilide , even for a short time, is extremely dangerous due to an increased risk of a potentially fatal heart rhythm disturbance known as torsades de pointes. In the presence of this interaction, a short-term course of fluconazole is still considered dangerous. A doctor would need to prescribe an alternative antifungal medication for the patient.    Patient notified of above recommendations per Pharm-D. She will use OTC vaginal cream to treat yeast infection and will not take diflucan.

## 2024-08-23 ENCOUNTER — Ambulatory Visit: Admitting: Family Medicine

## 2024-08-23 ENCOUNTER — Ambulatory Visit: Payer: Self-pay

## 2024-08-23 ENCOUNTER — Encounter: Payer: Self-pay | Admitting: Family Medicine

## 2024-08-23 VITALS — BP 132/80 | HR 71 | Temp 98.5°F | Ht 67.0 in | Wt 323.0 lb

## 2024-08-23 DIAGNOSIS — Z006 Encounter for examination for normal comparison and control in clinical research program: Secondary | ICD-10-CM

## 2024-08-23 DIAGNOSIS — J069 Acute upper respiratory infection, unspecified: Secondary | ICD-10-CM | POA: Diagnosis not present

## 2024-08-23 LAB — POCT INFLUENZA A/B
Influenza A, POC: NEGATIVE
Influenza B, POC: NEGATIVE

## 2024-08-23 LAB — POC COVID19 BINAXNOW: SARS Coronavirus 2 Ag: NEGATIVE

## 2024-08-23 NOTE — Progress Notes (Signed)
 [facility] PRIMARY CARE [facility] [address] [place] [address] Dept: [phone] Dept Fax: [phone]  Office Visit  Subjective:    Patient ID: Regina Morales, female    DOB: 09/24/1961, 63 y.o..   MRN: [id number]  Chief Complaint  Patient presents with   Cough    C/o cough, ST, sneezing, earache x 4 days.   Has been taking Tylenol .    History of Present Illness:  Patient is in today with a 4-day history of cough, sore throat, sneezing and earache. She notes she ran a fever of 101.3 F on Friday. She has been taking Tylenol .   Past Medical History: Patient Active Problem List   Diagnosis Date Noted   Prediabetes 06/21/2024   Numbness of feet 06/18/2024   Annual physical exam 06/18/2024   Chronic obstructive pulmonary disease with acute exacerbation (HCC) 11/13/2023   Bipolar 2 disorder (HCC) 04/08/2023   Generalized anxiety disorder 04/08/2023   Bipolar 1 disorder (HCC) 03/11/2023   Psychogenic vomiting with nausea 03/11/2023   COVID-19 11/18/2022   Gastritis without bleeding 03/29/2022   Irritable bowel syndrome with diarrhea 03/29/2022   NAFLD (nonalcoholic fatty liver disease) 94/73/7976   Postcholecystectomy diarrhea 12/07/2021   Sensorineural hearing loss (SNHL) of both ears 12/07/2021   Urinary incontinence 12/07/2021   Epigastric abdominal pain 08/20/2021   Diverticulitis 06/08/2021   Lumbar degenerative disc disease 05/28/2021   Mild spinal stenosis at L4-L5 level 05/28/2021   Severe recurrent major depression without psychotic features (HCC) 04/30/2020   OSA (obstructive sleep apnea) 07/27/2019   Stage 3 severe COPD by GOLD classification (HCC) 06/24/2019   Fibromyalgia 05/03/2019   Venous insufficiency of both lower extremities 04/29/2019   Encounter for monitoring dofetilide  therapy 09/15/2018   Endometrial polyp    Normocytic anemia 04/24/2014   Migraine 04/22/2014   Paroxysmal atrial fibrillation (HCC)    GERD  (gastroesophageal reflux disease)    BMI 40.0-44.9, adult [facility])    Past Surgical History:  Procedure Laterality Date   APPENDECTOMY  1990's   BREAST CYST ASPIRATION Left 2019   @ [facility]   CHOLECYSTECTOMY N/A 09/14/2021   Procedure: LAPAROSCOPIC CHOLECYSTECTOMY WITH INTRAOPERATIVE CHOLANGIOGRAM;  Surgeon: Dasie Leonor CROME, MD;  Location: [facility] OR;  Service: General;  Laterality: N/A;   COLONOSCOPY     DILATATION & CURETTAGE/HYSTEROSCOPY WITH MYOSURE N/A 04/14/2017   Procedure: DILATATION & CURETTAGE/HYSTEROSCOPY WITH MYOSURE;  Surgeon: Marilynn Nest, DO;  Location: [facility] ORS;  Service: Gynecology;  Laterality: N/A;  Polypectomy   DILATION AND CURETTAGE OF UTERUS  1983   [facility]   ERCP N/A 09/15/2021   Procedure: ENDOSCOPIC RETROGRADE CHOLANGIOPANCREATOGRAPHY (ERCP);  Surgeon: Rosalie Kitchens, MD;  Location: [facility] ENDOSCOPY;  Service: Endoscopy;  Laterality: N/A;   INNER EAR SURGERY Right 2000's   LEFT HEART CATH AND CORONARY ANGIOGRAPHY N/A 04/17/2017   Procedure: Left Heart Cath and Coronary Angiography;  Surgeon: Dann Candyce RAMAN, MD;  Location: [facility] INVASIVE CV LAB;  Service: Cardiovascular;  Laterality: N/A;   PLANTAR FASCIA RELEASE Right 07/2015   REMOVAL OF STONES  09/15/2021   Procedure: REMOVAL OF STONES;  Surgeon: Rosalie Kitchens, MD;  Location: [facility] ENDOSCOPY;  Service: Endoscopy;;   SPHINCTEROTOMY  09/15/2021   Procedure: ANNETT;  Surgeon: Rosalie Kitchens, MD;  Location: [facility] ENDOSCOPY;  Service: Endoscopy;;   TUBAL LIGATION  1985   ULNAR NERVE TRANSPOSITION Right    Family History  Problem Relation Age of Onset   Lung cancer Father    Alcohol abuse Father  Cancer Father    Early death Father    Kidney failure Mother        s/p renal Tx   Congestive Heart Failure Mother    Heart disease Mother    Kidney disease Mother    Obesity Mother    Atrial fibrillation Brother    Alcohol abuse Brother    Sudden Cardiac Death Daughter        cardiac arrest   Cardiomyopathy Daughter    Kassie  Parkinson White syndrome Grandchild         granddaughter   Anxiety disorder Sister    Miscarriages / [place] Sister    Outpatient Medications Prior to Visit  Medication Sig Dispense Refill   acetaminophen  (TYLENOL ) 500 MG tablet Take 2 tablets (1,000 mg total) by mouth 3 (three) times daily as needed for mild pain. 30 tablet 0   albuterol  (VENTOLIN  HFA) 108 (90 Base) MCG/ACT inhaler Inhale 2 puffs into the lungs every 6 (six) hours as needed for wheezing or shortness of breath. 8 g 0   diltiazem  (CARDIZEM ) 30 MG tablet TAKE 1 TABLET EVERY 4 HOURS AS NEEDED FOR AFIB HEART RATE >100 AS LONG AS TOP BP >100. 270 tablet 1   divalproex  (DEPAKOTE  ER) 250 MG 24 hr tablet Take 250 mg by mouth daily.     dofetilide  (TIKOSYN ) 500 MCG capsule TAKE 1 CAPSULE BY MOUTH 2 TIMES DAILY. 180 capsule 1   DULoxetine  (CYMBALTA ) 60 MG capsule Take 2 capsules (120 mg total) by mouth daily. 60 capsule 1   ELIQUIS  5 MG TABS tablet TAKE 1 TABLET BY MOUTH TWICE A DAY 180 tablet 3   fluticasone  (FLONASE ) 50 MCG/ACT nasal spray SPRAY 2 SPRAYS INTO EACH NOSTRIL EVERY DAY 48 mL 3   ipratropium-albuterol  (DUONEB) 0.5-2.5 (3) MG/3ML SOLN Take 3 mLs by nebulization every 4 (four) hours as needed (shortness of breath or wheezing). 120 mL 0   LATUDA  60 MG TABS Take 2 tablets (120 mg total) by mouth at bedtime. 30 tablet 1   metoprolol  tartrate (LOPRESSOR ) 50 MG tablet TAKE 1 TABLET BY MOUTH TWICE A DAY 180 tablet 3   Misc. Devices MISC Nebulizer machine with tubing 1 each 0   omeprazole  (PRILOSEC) 20 MG capsule Take 1 capsule (20 mg total) by mouth daily. 30 capsule 3   potassium chloride  SA (KLOR-CON  M) 20 MEQ tablet Take 1.5 tablets (30 mEq total) by mouth daily. 135 tablet 1   zolmitriptan  (ZOMIG ) 5 MG tablet Take 1 tablet (5 mg total) by mouth as needed for migraine. 10 tablet 2   amoxicillin -clavulanate (AUGMENTIN ) 875-125 MG tablet Take 1 tablet by mouth 2 (two) times daily. 20 tablet 0   hydrOXYzine  (VISTARIL ) 25 MG  capsule Take 25 mg by mouth at bedtime. (Patient not taking: Reported on 08/23/2024)     No facility-administered medications prior to visit.   Allergies  Allergen Reactions   Codeine Nausea And Vomiting, Other (See Comments) and Nausea Only     Objective:   Today's Vitals   08/23/24 1416  BP: 132/80  Pulse: 71  Temp: 98.5 F (36.9 C)  TempSrc: Oral  SpO2: 99%  Weight: (!) 323 lb (146.5 kg)  Height: 5' 7 (1.702 m)   Body mass index is 50.59 kg/m.   General: Well developed, well nourished. No acute distress. HEENT: Normocephalic, non-traumatic. PERRL, EOMI. Conjunctiva clear. External ears normal. EAC and TMs normal   bilaterally. Nose clear without congestion or rhinorrhea. Mucous membranes moist. Oropharynx clear. Good  dentition. Neck: Supple. No lymphadenopathy. No thyromegaly. Lungs: Clear to auscultation bilaterally. There is some sonorous noise from the upper airways. No wheezing, rales or   rhonchi. Psych: Alert and oriented. Normal mood and affect.  Health Maintenance Due  Topic Date Due   Pneumococcal Vaccine: 50+ Years (1 of 2 - PCV) Never done   Mammogram  08/25/2023   Lab Results POCT Covid: Neg. POCT Influenza A& B: Neg.    Assessment & Plan:  1. Viral URI with cough (Primary) Discussed home care for viral illness, including rest, pushing fluids, and OTC medications as needed for symptom relief. Recommend hot tea with honey for sore throat symptoms. Follow-up if needed for worsening or persistent symptoms.  - POC COVID-19 - POCT Influenza A/B  Return if symptoms worsen or fail to improve.   Garnette CHRISTELLA Simpler, MD

## 2024-08-23 NOTE — Telephone Encounter (Signed)
 Noted. Dm/cma

## 2024-08-23 NOTE — Telephone Encounter (Signed)
 FYI Only or Action Required?: FYI only for provider.  Patient was last seen in primary care on 08/10/2024 by Thedora Garnette HERO, MD.  Called Nurse Triage reporting Cough.  Symptoms began several days ago.  Interventions attempted: OTC medications: Tylenol .  Symptoms are: unchanged.  Triage Disposition: See Physician Within 24 Hours  Patient/caregiver understands and will follow disposition?: Yes     Copied from CRM #[id number]. Topic: Clinical - Red Word Triage >> Aug 23, 2024 11:09 AM Drema MATSU wrote: Red Word that prompted transfer to Nurse Triage: Patient has a cough, sore throat, and ear pain (hurts when swallow)       Reason for Disposition  Earache is present  Answer Assessment - Initial Assessment Questions 1. ONSET: When did the cough begin?      4 days ago 2. SEVERITY: How bad is the cough today?      Moderate  3. SPUTUM: Describe the color of your sputum (e.g., none, dry cough; clear, white, yellow, green)     None 4. HEMOPTYSIS: Are you coughing up any blood? If Yes, ask: How much? (e.g., flecks, streaks, tablespoons, etc.)     No 5. DIFFICULTY BREATHING: Are you having difficulty breathing? If Yes, ask: How bad is it? (e.g., mild, moderate, severe)      No 6. FEVER: Do you have a fever? If Yes, ask: What is your temperature, how was it measured, and when did it start?     None now 7. CARDIAC HISTORY: Do you have any history of heart disease? (e.g., heart attack, congestive heart failure)      No  8. LUNG HISTORY: Do you have any history of lung disease?  (e.g., pulmonary embolus, asthma, emphysema)     Yes  9. PE RISK FACTORS: Do you have a history of blood clots? (or: recent major surgery, recent prolonged travel, bedridden)     No 10. OTHER SYMPTOMS: Do you have any other symptoms? (e.g., runny nose, wheezing, chest pain)       Sore throat, left ear pain  11. PREGNANCY: Is there any chance you are pregnant? When was your last  menstrual period?       N/A 12. TRAVEL: Have you traveled out of the country in the last month? (e.g., travel history, exposures)       No  Protocols used: Cough - Acute Non-Productive-A-AH

## 2024-08-30 ENCOUNTER — Other Ambulatory Visit: Payer: Self-pay | Admitting: Medical Genetics

## 2024-08-30 DIAGNOSIS — Z006 Encounter for examination for normal comparison and control in clinical research program: Secondary | ICD-10-CM

## 2024-09-18 ENCOUNTER — Other Ambulatory Visit: Payer: Self-pay | Admitting: Family Medicine

## 2024-09-18 DIAGNOSIS — K219 Gastro-esophageal reflux disease without esophagitis: Secondary | ICD-10-CM

## 2024-10-06 LAB — GENECONNECT MOLECULAR SCREEN: Genetic Analysis Overall Interpretation: NEGATIVE

## 2024-11-07 NOTE — Patient Instructions (Signed)
 Keep the wound clean and dry, warm soapy water is ok, as well as antibiotic ointment Take amoxicillin  clavulanate with food and until complete, take a probiotic to help prevent side effects such as diarrhea and yeast infections You can also take acetaminophen  650 mg every 6 hours if needed for pain Follow up with your provider for reevaluation and if you have any new or worsening symptoms return for reevaluation

## 2024-11-07 NOTE — Progress Notes (Signed)
 "  Discussed with patient/guardian the use of audio recording for this encounter.  Risks, benefits and alternatives (including option to decline recording) were reviewed.  Patient expressed understanding and verbally confirmed consent to proceed with recording.   Subjective  The following information was reviewed by members of the visit team:  Tobacco  Allergies  Meds  Problems  Med Hx  Surg Hx  OB Status   Fam Hx    Regina Morales is a 64 y.o. female who presents for Animal Bite (Last night her Shitzu bit her. Daughter flushed it and cleaned it then wrapped it. Had been bitten by the same dog previously and had Augmentin  left. She took one dosage of that. Dog is up to date on shots. ) History of Present Illness The patient presents for evaluation of a dog bite.  She sustained the injury on her dominant right hand 2nd finger while feeding her dog a ribeye steak, it was his birthday. She has been cleaning the wound with warm soapy water. Last tetanus vaccination was in 2022.  She reports her dog is UTD with rabies vaccine.  She has atrial fibrillation and is on Eliquis .  Animal control form has been filled out.  ALLERGIES Allergic to codeine.  MEDICATIONS Eliquis   IMMUNIZATIONS Last tetanus vaccination in 2018.  Review of Systems  Constitutional:  Negative for chills and fever.  Respiratory:  Negative for shortness of breath.   Cardiovascular:  Negative for chest pain.  Gastrointestinal:  Negative for abdominal pain, diarrhea, nausea and vomiting.  Genitourinary:  Negative for dysuria.  Skin:  Positive for wound.  Neurological:  Negative for dizziness, weakness, numbness and headaches.    Objective  Blood pressure (!) 163/88, pulse 86, temperature 98.6 F (37 C), temperature source Tympanic, resp. rate 17, height 1.702 m (5' 7), weight 136 kg (300 lb), SpO2 99%.  No LMP recorded. Patient is postmenopausal.   Behavioral Health Screening  Patient Health  Questionnaire-2 Score: 0 (11/07/2024  4:45 PM)      Patient's Depression screening is Negative   Depression Plan: Normal/Negative Screening  Physical Exam Vitals and nursing note reviewed.  Constitutional:      Appearance: Normal appearance. She is obese.  Cardiovascular:     Rate and Rhythm: Normal rate and regular rhythm.     Pulses: Normal pulses.     Heart sounds: Normal heart sounds.  Pulmonary:     Effort: Pulmonary effort is normal.     Breath sounds: Normal breath sounds.  Skin:    General: Skin is warm and dry.     Capillary Refill: Capillary refill takes 2 to 3 seconds.     Findings: No bruising or erythema.     Comments: 2nd finger right hand, less than 1 cm laceration over the DIP joint, bleeding controlled, TTP, FROM, remains NVI, no sutures needed  Neurological:     General: No focal deficit present.     Mental Status: She is alert and oriented to person, place, and time.  Psychiatric:        Mood and Affect: Mood normal.        Behavior: Behavior normal.     No results found for this or any previous visit (from the past 24 hours).  Radiologist interpretation:    XR Hand Minimum 3 Views Right  Final Result by Liborio Jake Mage, MD (01/04 1634)  X-RAY HAND RIGHT (3+ VIEWS), 11/07/2024 4:17 PM    INDICATION: Bitten by dog, initial encounter \ W54.0XXA  Bitten by dog,   initial encounter   COMPARISON: None.    IMPRESSION:  1.  No acute fracture.   2.  No malalignment.  3.  Joint spaces are maintained. No foreign body noted. Soft tissue   irregularity noted involving the second digit         Assessment & Plan 1. Dog bite. Sustained a dog bite on the dominant right hand. Wound cleaned with warm soapy water. On Eliquis  for atrial fibrillation, so apply pressure to prevent excessive bleeding. Prescribed antibiotics to prevent infection. Keep bandage on and apply pressure. Loosen if too tight. Remove bandage only to wash hands or clean wound; it will stick  to itself when reapplied.     Attestation    Assessment/Plan   Norell was seen today for animal bite.  Diagnoses and all orders for this visit:  Dog bite, initial encounter -     XR Hand Minimum 3 Views Right -     amoxicillin -pot clavulanate (AUGMENTIN ) 875-125 mg per tablet; Take 1 tablet by mouth 2 (two) times a day for 10 days.  Laceration of right index finger without foreign body without damage to nail, initial encounter    Patient has been instructed on RX/ OTC medications, dosages, side effects, and possible interactions as associated with each diagnosis in my impression and plan above.  2.   Patient education (verbal/handout) given on diagnosis, pathophysiology, treatment of diagnosis, side effects of medication use for treatment, restrictions while taking medication.  Supportive       Care measures as directed on AVS.  Red Flags associated with diagnosis/es were reviewed and patient instructed on action plan if red flags develop.  3.   Urgent Care Disposition:  Follow up with PCP       They have been instructed that if symptoms worse that should go to Urgent Care, go to the nearest ED, or activate EMS.  4.   Patient agreed with plan and voiced understanding.  NO barriers to adherence perceived by myself.  Electronically signed: Rosaline Jama Collet FNP  Sun 11/07/2024 7:16 PM    "

## 2024-12-09 ENCOUNTER — Ambulatory Visit (HOSPITAL_COMMUNITY): Admitting: Internal Medicine

## 2024-12-09 NOTE — Progress Notes (Incomplete)
 "   Primary Care Physician: Thedora Garnette HERO, MD Primary Cardiologist: Maude Emmer, MD Electrophysiologist: Will Gladis Norton, MD     Referring Physician: Dr. Camnitz     Regina Morales is a 64 y.o. female with a history of HTN, OSA on CPAP, DVT/PE, GERD, fibromyalgia, and atrial fibrillation who presents for consultation in the [facility]. Patient previously on Tikosyn  but during visit with cardiology on 7/30 noted her insurance stopped paying for Tikosyn  6 months prior. She has been having episodes of Afib several times a week. Patient is on Eliquis  for a CHADS2VASC score of 2. On follow up 07/21/23, patient is here for restart of Tikosyn  admission. She was previously on Tikosyn  500 mcg BID. S/p Tikosyn  admission 9/16-19/24. She did not require cardioversion and discharged on 500 mcg dosage. She has had no episodes of Afib since hospital discharge. Patient tells me she has missed several appointments due to oversleeping secondary to work schedule of second shift. She has missed the past two evening doses due to having missed multiple appointments and about to run out of Tikosyn .  Follow-up 12/09/2024 for Tikosyn  surveillance.  Patient appears to be maintaining SR.  No missed doses of Tikosyn  500 mcg twice daily.  No bleeding issues on Eliquis .  Today, she denies symptoms of palpitations, chest pain, shortness of breath, orthopnea, PND, lower extremity edema, dizziness, presyncope, syncope, snoring, daytime somnolence, bleeding, or neurologic sequela. The patient is tolerating medications without difficulties and is otherwise without complaint today.    Atrial Fibrillation Risk Factors:  she does have symptoms or diagnosis of sleep apnea. she is compliant with CPAP therapy.  she has a BMI of There is no height or weight on file to calculate BMI.. There were no vitals filed for this visit.   Current Outpatient Medications  Medication Sig Dispense Refill    acetaminophen  (TYLENOL ) 500 MG tablet Take 2 tablets (1,000 mg total) by mouth 3 (three) times daily as needed for mild pain. 30 tablet 0   albuterol  (VENTOLIN  HFA) 108 (90 Base) MCG/ACT inhaler Inhale 2 puffs into the lungs every 6 (six) hours as needed for wheezing or shortness of breath. 8 g 0   diltiazem  (CARDIZEM ) 30 MG tablet TAKE 1 TABLET EVERY 4 HOURS AS NEEDED FOR AFIB HEART RATE >100 AS LONG AS TOP BP >100. 270 tablet 1   divalproex  (DEPAKOTE  ER) 250 MG 24 hr tablet Take 250 mg by mouth daily.     dofetilide  (TIKOSYN ) 500 MCG capsule TAKE 1 CAPSULE BY MOUTH 2 TIMES DAILY. 180 capsule 1   DULoxetine  (CYMBALTA ) 60 MG capsule Take 2 capsules (120 mg total) by mouth daily. 60 capsule 1   ELIQUIS  5 MG TABS tablet TAKE 1 TABLET BY MOUTH TWICE A DAY 180 tablet 3   fluticasone  (FLONASE ) 50 MCG/ACT nasal spray SPRAY 2 SPRAYS INTO EACH NOSTRIL EVERY DAY 48 mL 3   ipratropium-albuterol  (DUONEB) 0.5-2.5 (3) MG/3ML SOLN Take 3 mLs by nebulization every 4 (four) hours as needed (shortness of breath or wheezing). 120 mL 0   LATUDA  60 MG TABS Take 2 tablets (120 mg total) by mouth at bedtime. 30 tablet 1   metoprolol  tartrate (LOPRESSOR ) 50 MG tablet TAKE 1 TABLET BY MOUTH TWICE A DAY 180 tablet 3   Misc. Devices MISC Nebulizer machine with tubing 1 each 0   omeprazole  (PRILOSEC) 20 MG capsule TAKE 1 CAPSULE BY MOUTH EVERY DAY 90 capsule 3   potassium chloride  SA (KLOR-CON  M) 20  MEQ tablet Take 1.5 tablets (30 mEq total) by mouth daily. 135 tablet 1   zolmitriptan  (ZOMIG ) 5 MG tablet Take 1 tablet (5 mg total) by mouth as needed for migraine. 10 tablet 2   No current facility-administered medications for this visit.    Atrial Fibrillation Management history:  Previous antiarrhythmic drugs: flecainide  failure, tikosyn   Previous cardioversions: none Previous ablations: none Anticoagulation history: Eliquis   ROS- All systems are reviewed and negative except as per the HPI above.  Physical  Exam: There were no vitals taken for this visit.  GEN- The patient is well appearing, alert and oriented x 3 today.   Neck - no JVD or carotid bruit noted Lungs- Clear to ausculation bilaterally, normal work of breathing Heart- ***Regular rate and rhythm, no murmurs, rubs or gallops, PMI not laterally displaced Extremities- no clubbing, cyanosis, or edema Skin - no rash or ecchymosis noted   EKG today demonstrates  ***  Echo 06/15/20 demonstrated   1. Left ventricular ejection fraction, by estimation, is 55 to 60%. The  left ventricle has normal function. The left ventricle has no regional  wall motion abnormalities. Left ventricular diastolic parameters were  normal.   2. Right ventricular systolic function is normal. The right ventricular  size is normal. Tricuspid regurgitation signal is inadequate for assessing  PA pressure.   3. The mitral valve is normal in structure. Trivial mitral valve  regurgitation. No evidence of mitral stenosis.   4. The aortic valve is tricuspid. Aortic valve regurgitation is not  visualized. No aortic stenosis is present.   Comparison(s): 04/17/17 EF 55-60%.   CHA2DS2-VASc Score = 2  The patient's score is based upon: CHF History: 0 HTN History: 1 Diabetes History: 0 Stroke History: 0 Vascular Disease History: 0 Age Score: 0 Gender Score: 1       ASSESSMENT AND PLAN: Paroxysmal Atrial Fibrillation (ICD10:  I48.0) The patient's CHA2DS2-VASc score is 2, indicating a 2.2% annual risk of stroke.   S/p Tikosyn  9/16-19/24.  Patient is currently in***.  She notes overall low A-fib burden.  We will continue with current medication therapy without any changes at this time.  Patient wishes to continue Eliquis  5 mg twice daily.  High risk medication monitoring (ICD10: [id number]) Patient requires ongoing monitoring for anti-arrhythmic medication which has the potential to cause life threatening arrhythmias or AV block. Qtc stable. Continue Tikosyn   500 mcg twice daily. Bmet and mag drawn today.      Follow up *** months for Tikosyn  surveillance.    Terra Pac, PA-C  [facility] [address] [place], [place] [address] [phone]  "

## 2024-12-13 ENCOUNTER — Ambulatory Visit (HOSPITAL_COMMUNITY): Admitting: Internal Medicine
